# Patient Record
Sex: Male | Born: 1970
Health system: Southern US, Community
[De-identification: ages and names within clinical notes are randomized; demographics above are authoritative.]

## PROBLEM LIST (undated history)

## (undated) DIAGNOSIS — J189 Pneumonia, unspecified organism: Secondary | ICD-10-CM

## (undated) DIAGNOSIS — I739 Peripheral vascular disease, unspecified: Secondary | ICD-10-CM

## (undated) DIAGNOSIS — I219 Acute myocardial infarction, unspecified: Secondary | ICD-10-CM

## (undated) DIAGNOSIS — E119 Type 2 diabetes mellitus without complications: Secondary | ICD-10-CM

## (undated) DIAGNOSIS — I10 Essential (primary) hypertension: Secondary | ICD-10-CM

## (undated) DIAGNOSIS — N189 Chronic kidney disease, unspecified: Secondary | ICD-10-CM

## (undated) DIAGNOSIS — IMO0002 Reserved for concepts with insufficient information to code with codable children: Secondary | ICD-10-CM

## (undated) DIAGNOSIS — B181 Chronic viral hepatitis B without delta-agent: Secondary | ICD-10-CM

## (undated) DIAGNOSIS — N186 End stage renal disease: Secondary | ICD-10-CM

## (undated) HISTORY — PX: CORONARY ARTERY BYPASS GRAFT: SHX141

## (undated) HISTORY — PX: CARDIAC SURGERY: SHX584

## (undated) HISTORY — PX: HIP ARTHROPLASTY: SHX981

## (undated) HISTORY — PX: CORONARY ANGIOPLASTY WITH STENT PLACEMENT: SHX49

---

## 1999-08-12 ENCOUNTER — Emergency Department (HOSPITAL_COMMUNITY): Admission: EM | Admit: 1999-08-12 | Discharge: 1999-08-12 | Payer: Self-pay | Admitting: Emergency Medicine

## 2000-01-28 ENCOUNTER — Emergency Department (HOSPITAL_COMMUNITY): Admission: EM | Admit: 2000-01-28 | Discharge: 2000-01-28 | Payer: Self-pay | Admitting: Emergency Medicine

## 2000-01-29 ENCOUNTER — Encounter: Payer: Self-pay | Admitting: Emergency Medicine

## 2000-02-10 ENCOUNTER — Encounter: Payer: Self-pay | Admitting: Emergency Medicine

## 2000-02-10 ENCOUNTER — Emergency Department (HOSPITAL_COMMUNITY): Admission: EM | Admit: 2000-02-10 | Discharge: 2000-02-10 | Payer: Self-pay | Admitting: Emergency Medicine

## 2000-06-12 ENCOUNTER — Emergency Department (HOSPITAL_COMMUNITY): Admission: EM | Admit: 2000-06-12 | Discharge: 2000-06-12 | Payer: Self-pay | Admitting: Emergency Medicine

## 2000-06-12 ENCOUNTER — Encounter: Payer: Self-pay | Admitting: Emergency Medicine

## 2000-06-15 ENCOUNTER — Emergency Department (HOSPITAL_COMMUNITY): Admission: EM | Admit: 2000-06-15 | Discharge: 2000-06-15 | Payer: Self-pay | Admitting: Emergency Medicine

## 2000-06-15 ENCOUNTER — Encounter: Payer: Self-pay | Admitting: Emergency Medicine

## 2000-10-09 ENCOUNTER — Emergency Department (HOSPITAL_COMMUNITY): Admission: EM | Admit: 2000-10-09 | Discharge: 2000-10-10 | Payer: Self-pay | Admitting: Emergency Medicine

## 2000-11-24 ENCOUNTER — Emergency Department (HOSPITAL_COMMUNITY): Admission: EM | Admit: 2000-11-24 | Discharge: 2000-11-24 | Payer: Self-pay | Admitting: Emergency Medicine

## 2001-07-06 ENCOUNTER — Emergency Department (HOSPITAL_COMMUNITY): Admission: EM | Admit: 2001-07-06 | Discharge: 2001-07-06 | Payer: Self-pay | Admitting: Emergency Medicine

## 2001-08-06 ENCOUNTER — Emergency Department (HOSPITAL_COMMUNITY): Admission: EM | Admit: 2001-08-06 | Discharge: 2001-08-06 | Payer: Self-pay | Admitting: Emergency Medicine

## 2001-12-09 ENCOUNTER — Emergency Department (HOSPITAL_COMMUNITY): Admission: EM | Admit: 2001-12-09 | Discharge: 2001-12-10 | Payer: Self-pay | Admitting: Emergency Medicine

## 2002-01-27 ENCOUNTER — Emergency Department (HOSPITAL_COMMUNITY): Admission: EM | Admit: 2002-01-27 | Discharge: 2002-01-27 | Payer: Self-pay | Admitting: Emergency Medicine

## 2002-01-28 ENCOUNTER — Encounter: Payer: Self-pay | Admitting: Emergency Medicine

## 2002-05-05 ENCOUNTER — Emergency Department (HOSPITAL_COMMUNITY): Admission: EM | Admit: 2002-05-05 | Discharge: 2002-05-05 | Payer: Self-pay

## 2002-09-30 ENCOUNTER — Emergency Department (HOSPITAL_COMMUNITY): Admission: EM | Admit: 2002-09-30 | Discharge: 2002-09-30 | Payer: Self-pay | Admitting: Emergency Medicine

## 2002-09-30 ENCOUNTER — Encounter: Payer: Self-pay | Admitting: Emergency Medicine

## 2003-02-21 ENCOUNTER — Emergency Department (HOSPITAL_COMMUNITY): Admission: EM | Admit: 2003-02-21 | Discharge: 2003-02-21 | Payer: Self-pay | Admitting: Emergency Medicine

## 2003-02-21 ENCOUNTER — Encounter: Payer: Self-pay | Admitting: Emergency Medicine

## 2003-08-25 ENCOUNTER — Ambulatory Visit (HOSPITAL_BASED_OUTPATIENT_CLINIC_OR_DEPARTMENT_OTHER): Admission: RE | Admit: 2003-08-25 | Discharge: 2003-08-25 | Payer: Self-pay | Admitting: Otolaryngology

## 2004-08-07 ENCOUNTER — Emergency Department (HOSPITAL_COMMUNITY): Admission: EM | Admit: 2004-08-07 | Discharge: 2004-08-07 | Payer: Self-pay | Admitting: Unknown Physician Specialty

## 2005-06-14 ENCOUNTER — Emergency Department (HOSPITAL_COMMUNITY): Admission: EM | Admit: 2005-06-14 | Discharge: 2005-06-14 | Payer: Self-pay | Admitting: Emergency Medicine

## 2014-01-09 ENCOUNTER — Emergency Department (HOSPITAL_COMMUNITY): Payer: Self-pay

## 2014-01-09 ENCOUNTER — Inpatient Hospital Stay (HOSPITAL_COMMUNITY): Payer: Self-pay

## 2014-01-09 ENCOUNTER — Encounter (HOSPITAL_COMMUNITY): Payer: Self-pay | Admitting: Emergency Medicine

## 2014-01-09 ENCOUNTER — Inpatient Hospital Stay (HOSPITAL_COMMUNITY)
Admission: EM | Admit: 2014-01-09 | Discharge: 2014-01-12 | DRG: 871 | Disposition: A | Discharge status: Home / Self Care | Payer: Self-pay | Attending: Internal Medicine | Admitting: Internal Medicine

## 2014-01-09 DIAGNOSIS — G4733 Obstructive sleep apnea (adult) (pediatric): Secondary | ICD-10-CM | POA: Diagnosis present

## 2014-01-09 DIAGNOSIS — A419 Sepsis, unspecified organism: Principal | ICD-10-CM

## 2014-01-09 DIAGNOSIS — D72829 Elevated white blood cell count, unspecified: Secondary | ICD-10-CM

## 2014-01-09 DIAGNOSIS — I10 Essential (primary) hypertension: Secondary | ICD-10-CM

## 2014-01-09 DIAGNOSIS — A498 Other bacterial infections of unspecified site: Secondary | ICD-10-CM | POA: Diagnosis present

## 2014-01-09 DIAGNOSIS — R7989 Other specified abnormal findings of blood chemistry: Secondary | ICD-10-CM

## 2014-01-09 DIAGNOSIS — E872 Acidosis, unspecified: Secondary | ICD-10-CM

## 2014-01-09 DIAGNOSIS — I2510 Atherosclerotic heart disease of native coronary artery without angina pectoris: Secondary | ICD-10-CM

## 2014-01-09 DIAGNOSIS — F17200 Nicotine dependence, unspecified, uncomplicated: Secondary | ICD-10-CM | POA: Diagnosis present

## 2014-01-09 DIAGNOSIS — R5081 Fever presenting with conditions classified elsewhere: Secondary | ICD-10-CM

## 2014-01-09 DIAGNOSIS — J40 Bronchitis, not specified as acute or chronic: Secondary | ICD-10-CM | POA: Diagnosis present

## 2014-01-09 DIAGNOSIS — Z794 Long term (current) use of insulin: Secondary | ICD-10-CM

## 2014-01-09 DIAGNOSIS — E876 Hypokalemia: Secondary | ICD-10-CM | POA: Diagnosis not present

## 2014-01-09 DIAGNOSIS — Z6835 Body mass index (BMI) 35.0-35.9, adult: Secondary | ICD-10-CM

## 2014-01-09 DIAGNOSIS — IMO0002 Reserved for concepts with insufficient information to code with codable children: Secondary | ICD-10-CM

## 2014-01-09 DIAGNOSIS — Z7902 Long term (current) use of antithrombotics/antiplatelets: Secondary | ICD-10-CM

## 2014-01-09 DIAGNOSIS — G9341 Metabolic encephalopathy: Secondary | ICD-10-CM | POA: Diagnosis present

## 2014-01-09 DIAGNOSIS — R4182 Altered mental status, unspecified: Secondary | ICD-10-CM | POA: Diagnosis present

## 2014-01-09 DIAGNOSIS — Z7982 Long term (current) use of aspirin: Secondary | ICD-10-CM

## 2014-01-09 DIAGNOSIS — E1010 Type 1 diabetes mellitus with ketoacidosis without coma: Secondary | ICD-10-CM | POA: Diagnosis present

## 2014-01-09 DIAGNOSIS — E1110 Type 2 diabetes mellitus with ketoacidosis without coma: Secondary | ICD-10-CM

## 2014-01-09 DIAGNOSIS — E1165 Type 2 diabetes mellitus with hyperglycemia: Secondary | ICD-10-CM

## 2014-01-09 DIAGNOSIS — Z9861 Coronary angioplasty status: Secondary | ICD-10-CM

## 2014-01-09 DIAGNOSIS — E118 Type 2 diabetes mellitus with unspecified complications: Secondary | ICD-10-CM

## 2014-01-09 DIAGNOSIS — N390 Urinary tract infection, site not specified: Secondary | ICD-10-CM | POA: Diagnosis present

## 2014-01-09 HISTORY — DX: Type 2 diabetes mellitus without complications: E11.9

## 2014-01-09 HISTORY — DX: Essential (primary) hypertension: I10

## 2014-01-09 HISTORY — DX: Reserved for concepts with insufficient information to code with codable children: IMO0002

## 2014-01-09 LAB — CBC WITH DIFFERENTIAL/PLATELET
BASOS ABS: 0 10*3/uL (ref 0.0–0.1)
Basophils Relative: 0 % (ref 0–1)
Eosinophils Absolute: 0.1 10*3/uL (ref 0.0–0.7)
Eosinophils Relative: 1 % (ref 0–5)
HCT: 38.4 % — ABNORMAL LOW (ref 39.0–52.0)
HEMOGLOBIN: 13.6 g/dL (ref 13.0–17.0)
LYMPHS PCT: 8 % — AB (ref 12–46)
Lymphs Abs: 0.9 10*3/uL (ref 0.7–4.0)
MCH: 32.1 pg (ref 26.0–34.0)
MCHC: 35.4 g/dL (ref 30.0–36.0)
MCV: 90.6 fL (ref 78.0–100.0)
Monocytes Absolute: 0.5 10*3/uL (ref 0.1–1.0)
Monocytes Relative: 4 % (ref 3–12)
NEUTROS ABS: 10.9 10*3/uL — AB (ref 1.7–7.7)
NEUTROS PCT: 87 % — AB (ref 43–77)
Platelets: 153 10*3/uL (ref 150–400)
RBC: 4.24 MIL/uL (ref 4.22–5.81)
RDW: 12.8 % (ref 11.5–15.5)
WBC: 12.4 10*3/uL — AB (ref 4.0–10.5)

## 2014-01-09 LAB — CBG MONITORING, ED
Glucose-Capillary: 290 mg/dL — ABNORMAL HIGH (ref 70–99)
Glucose-Capillary: 301 mg/dL — ABNORMAL HIGH (ref 70–99)

## 2014-01-09 LAB — URINALYSIS, ROUTINE W REFLEX MICROSCOPIC
Bilirubin Urine: NEGATIVE
Ketones, ur: NEGATIVE mg/dL
LEUKOCYTES UA: NEGATIVE
Nitrite: NEGATIVE
PH: 5.5 (ref 5.0–8.0)
Protein, ur: 30 mg/dL — AB
Specific Gravity, Urine: 1.025 (ref 1.005–1.030)
Urobilinogen, UA: 1 mg/dL (ref 0.0–1.0)

## 2014-01-09 LAB — COMPREHENSIVE METABOLIC PANEL
ALBUMIN: 3 g/dL — AB (ref 3.5–5.2)
ALK PHOS: 117 U/L (ref 39–117)
ALT: 22 U/L (ref 0–53)
AST: 25 U/L (ref 0–37)
BUN: 9 mg/dL (ref 6–23)
CHLORIDE: 98 meq/L (ref 96–112)
CO2: 19 mEq/L (ref 19–32)
Calcium: 9.2 mg/dL (ref 8.4–10.5)
Creatinine, Ser: 0.68 mg/dL (ref 0.50–1.35)
GFR calc Af Amer: >90 mL/min (ref >90)
GFR calc non Af Amer: >90 mL/min (ref >90)
Glucose, Bld: 337 mg/dL — ABNORMAL HIGH (ref 70–99)
POTASSIUM: 4.1 meq/L (ref 3.7–5.3)
Sodium: 134 mEq/L — ABNORMAL LOW (ref 137–147)
Total Bilirubin: 0.9 mg/dL (ref 0.3–1.2)
Total Protein: 7.3 g/dL (ref 6.0–8.3)

## 2014-01-09 LAB — BASIC METABOLIC PANEL
BUN: 9 mg/dL (ref 6–23)
CALCIUM: 8.4 mg/dL (ref 8.4–10.5)
CO2: 19 mEq/L (ref 19–32)
Chloride: 105 mEq/L (ref 96–112)
Creatinine, Ser: 0.71 mg/dL (ref 0.50–1.35)
GFR calc Af Amer: >90 mL/min (ref >90)
Glucose, Bld: 304 mg/dL — ABNORMAL HIGH (ref 70–99)
POTASSIUM: 3.9 meq/L (ref 3.7–5.3)
SODIUM: 138 meq/L (ref 137–147)

## 2014-01-09 LAB — URINE MICROSCOPIC-ADD ON

## 2014-01-09 LAB — I-STAT CG4 LACTIC ACID, ED: Lactic Acid, Venous: 3.8 mmol/L — ABNORMAL HIGH (ref 0.5–2.2)

## 2014-01-09 LAB — GLUCOSE, CAPILLARY
Glucose-Capillary: 278 mg/dL — ABNORMAL HIGH (ref 70–99)
Glucose-Capillary: 367 mg/dL — ABNORMAL HIGH (ref 70–99)

## 2014-01-09 MED ORDER — VANCOMYCIN HCL IN DEXTROSE 1-5 GM/200ML-% IV SOLN
1000.0000 mg | Freq: Once | INTRAVENOUS | Status: DC
  Filled 2014-01-09: qty 200

## 2014-01-09 MED ORDER — INSULIN ASPART 100 UNIT/ML ~~LOC~~ SOLN
0.0000 [IU] | Freq: Every day | SUBCUTANEOUS | Status: DC
  Administered 2014-01-09: 5 [IU] via SUBCUTANEOUS
  Administered 2014-01-10 – 2014-01-11 (×2): 2 [IU] via SUBCUTANEOUS

## 2014-01-09 MED ORDER — ENOXAPARIN SODIUM 40 MG/0.4ML ~~LOC~~ SOLN
40.0000 mg | SUBCUTANEOUS | Status: DC
  Administered 2014-01-09 – 2014-01-11 (×3): 40 mg via SUBCUTANEOUS
  Filled 2014-01-09 (×4): qty 0.4

## 2014-01-09 MED ORDER — PIPERACILLIN-TAZOBACTAM 3.375 G IVPB 30 MIN
3.3750 g | Freq: Once | INTRAVENOUS | Status: AC
  Administered 2014-01-09: 3.375 g via INTRAVENOUS
  Filled 2014-01-09: qty 50

## 2014-01-09 MED ORDER — ASPIRIN EC 81 MG PO TBEC
81.0000 mg | DELAYED_RELEASE_TABLET | Freq: Every day | ORAL | Status: DC
  Administered 2014-01-10 – 2014-01-12 (×3): 81 mg via ORAL
  Filled 2014-01-09 (×3): qty 1

## 2014-01-09 MED ORDER — VANCOMYCIN HCL 10 G IV SOLR
2500.0000 mg | INTRAVENOUS | Status: AC
  Administered 2014-01-09: 2500 mg via INTRAVENOUS
  Filled 2014-01-09: qty 2500

## 2014-01-09 MED ORDER — DEXTROSE 5 % IV SOLN
1.0000 g | Freq: Once | INTRAVENOUS | Status: AC
  Administered 2014-01-09: 1 g via INTRAVENOUS
  Filled 2014-01-09: qty 10

## 2014-01-09 MED ORDER — SODIUM CHLORIDE 0.9 % IV SOLN
INTRAVENOUS | Status: DC
  Filled 2014-01-09: qty 1

## 2014-01-09 MED ORDER — PIPERACILLIN-TAZOBACTAM 3.375 G IVPB
3.3750 g | Freq: Three times a day (TID) | INTRAVENOUS | Status: DC
  Administered 2014-01-09 – 2014-01-12 (×8): 3.375 g via INTRAVENOUS
  Filled 2014-01-09 (×10): qty 50

## 2014-01-09 MED ORDER — SODIUM CHLORIDE 0.9 % IV SOLN
1000.0000 mL | Freq: Once | INTRAVENOUS | Status: AC
  Administered 2014-01-09: 1000 mL via INTRAVENOUS

## 2014-01-09 MED ORDER — INSULIN ASPART 100 UNIT/ML ~~LOC~~ SOLN
0.0000 [IU] | Freq: Three times a day (TID) | SUBCUTANEOUS | Status: DC
  Administered 2014-01-10: 3 [IU] via SUBCUTANEOUS
  Administered 2014-01-10: 5 [IU] via SUBCUTANEOUS
  Administered 2014-01-10: 8 [IU] via SUBCUTANEOUS
  Administered 2014-01-11: 5 [IU] via SUBCUTANEOUS
  Administered 2014-01-11 (×2): 8 [IU] via SUBCUTANEOUS
  Administered 2014-01-12: 5 [IU] via SUBCUTANEOUS

## 2014-01-09 MED ORDER — LISINOPRIL 20 MG PO TABS
20.0000 mg | ORAL_TABLET | Freq: Two times a day (BID) | ORAL | Status: DC
  Administered 2014-01-10 – 2014-01-12 (×5): 20 mg via ORAL
  Filled 2014-01-09 (×7): qty 1

## 2014-01-09 MED ORDER — ACETAMINOPHEN 325 MG PO TABS
650.0000 mg | ORAL_TABLET | Freq: Four times a day (QID) | ORAL | Status: DC | PRN
  Administered 2014-01-10 – 2014-01-11 (×3): 650 mg via ORAL
  Filled 2014-01-09 (×3): qty 2

## 2014-01-09 MED ORDER — ONDANSETRON HCL 4 MG/2ML IJ SOLN: 4.0000 mg | Freq: Four times a day (QID) | INTRAMUSCULAR | Status: DC | PRN

## 2014-01-09 MED ORDER — OXYCODONE HCL 5 MG PO TABS: 5.0000 mg | ORAL_TABLET | ORAL | Status: DC | PRN

## 2014-01-09 MED ORDER — ACETAMINOPHEN 650 MG RE SUPP
650.0000 mg | Freq: Four times a day (QID) | RECTAL | Status: DC | PRN
Start: 2014-01-09 — End: 2014-01-12

## 2014-01-09 MED ORDER — SODIUM CHLORIDE 0.9 % IV SOLN
1000.0000 mL | INTRAVENOUS | Status: DC
  Administered 2014-01-09 – 2014-01-11 (×2): 1000 mL via INTRAVENOUS

## 2014-01-09 MED ORDER — DEXTROSE-NACL 5-0.45 % IV SOLN: INTRAVENOUS | Status: DC

## 2014-01-09 MED ORDER — ONDANSETRON HCL 4 MG PO TABS: 4.0000 mg | ORAL_TABLET | Freq: Four times a day (QID) | ORAL | Status: DC | PRN

## 2014-01-09 MED ORDER — INSULIN ASPART PROT & ASPART (70-30 MIX) 100 UNIT/ML ~~LOC~~ SUSP
15.0000 [IU] | Freq: Two times a day (BID) | SUBCUTANEOUS | Status: DC
  Administered 2014-01-09 – 2014-01-12 (×6): 15 [IU] via SUBCUTANEOUS
  Filled 2014-01-09 (×2): qty 10

## 2014-01-09 MED ORDER — VANCOMYCIN HCL 10 G IV SOLR
1250.0000 mg | Freq: Two times a day (BID) | INTRAVENOUS | Status: DC
  Administered 2014-01-10 – 2014-01-11 (×3): 1250 mg via INTRAVENOUS
  Filled 2014-01-09 (×4): qty 1250

## 2014-01-09 MED ORDER — ACETAMINOPHEN 650 MG RE SUPP
650.0000 mg | Freq: Once | RECTAL | Status: AC
  Administered 2014-01-09: 650 mg via RECTAL
  Filled 2014-01-09: qty 1

## 2014-01-09 MED ORDER — SODIUM CHLORIDE 0.9 % IV BOLUS (SEPSIS)
1000.0000 mL | Freq: Once | INTRAVENOUS | Status: AC
  Administered 2014-01-09: 1000 mL via INTRAVENOUS

## 2014-01-09 NOTE — ED Notes (Signed)
Patient CBG upon arrival 79

## 2014-01-09 NOTE — Progress Notes (Addendum)
ANTIBIOTIC CONSULT NOTE - INITIAL  Pharmacy Consult for Vanc / Zosyn Indication: Sepsis  No Known Allergies  Patient Measurements:   Adjusted Body Weight:   Vital Signs: Temp: 103.1 F (39.5 C) (05/02 1318) BP: 181/115 mmHg (05/02 1318) Pulse Rate: 120 (05/02 1318) Intake/Output from previous day:   Intake/Output from this shift:    Labs: No results found for this basename: WBC, HGB, PLT, LABCREA, CREATININE,  in the last 72 hours CrCl is unknown because no creatinine reading has been taken and the patient has no height on file. No results found for this basename: VANCOTROUGH, VANCOPEAK, VANCORANDOM, GENTTROUGH, GENTPEAK, GENTRANDOM, TOBRATROUGH, TOBRAPEAK, TOBRARND, AMIKACINPEAK, AMIKACINTROU, AMIKACIN,  in the last 72 hours   Microbiology: No results found for this or any previous visit (from the past 720 hour(s)).  Medical History: Past Medical History  Diagnosis Date  . Diabetes mellitus without complication   . Hypertension    Assessment: 79 yoM presents to [facility] with fever of 103.1, chills, and hyperglycemia.  Vancomycin and Zosyn ordered x 1 in ED, and pharmacy consulted to dose for sepsis.   No recent height/weight/lab values.    NKDA  5/2 >> CTX x1 5/2 >> Vancomycin >>   5/2 >> Zosyn >>  Tmax: 103.1 WBCs: CBC ordered Renal: CMET ordered  5/2 blood: ordered 5/2 urine: collected   Goal of Therapy:  Vancomycin trough level 15-20 mcg/ml Eradication of infection  Plan:  Wait to order maintenance doses of vancomycin and zosyn until renal function and weight known  Ralene Bathe, PharmD, BCPS 01/09/2014, 1:39 PM  Pager: [phone]  ADDENDUM:   Labs/weight/height now entered   CrCl >100 ml/min (CG and N) Wt 127kg  Plan: Start Zosyn 3.375g IV q8h (infuse over 4 hours) Change initial vancomycin dose to 2500mg  IV x1, then start 1250mg  IV q12h  F/u vanc trough at Css, renal fxn, clinical course  Ralene Bathe, PharmD, BCPS 01/09/2014, 3:00 PM  Pager:  [phone]

## 2014-01-09 NOTE — ED Notes (Signed)
Pt called EMS post admin insulin reporting that he had chills. Pt shaking upon arrival. Pt wife reports that pt was "fine this morning." Pt is alert and answers questions appropriately, and looking at wife's cell phone at this time. Pt in NAD

## 2014-01-09 NOTE — ED Notes (Signed)
Pt sitting up in bed, laughing with family. Pt is A&O and denies pain. Pt states " I feel better, can I go home now?"

## 2014-01-09 NOTE — ED Provider Notes (Signed)
CSN: [id number]     Arrival date & time 01/09/14  1315 History   First MD Initiated Contact with Patient 01/09/14 1321     Chief Complaint  Patient presents with  . Fever  . Hyperglycemia     (Consider location/radiation/quality/duration/timing/severity/associated sxs/prior Treatment) HPI  This 43 year old male with history of diabetes and hypertension, coronary artery disease who presents with fever, altered mental status and hyperglycemia.  Patient states he has not been feeling well. He reports several days of chills and headache. Upon my initial evaluation, patient is awake and alert and appears to be answering questions appropriately. However, at times he is disoriented. He denies any pain anywhere. He does endorse a nonproductive cough. States his blood sugars have been "all over the place."  Level V caveat for acuity of condition  Past Medical History  Diagnosis Date  . Diabetes mellitus without complication   . Hypertension   . Macular infarction     2012   Past Surgical History  Procedure Laterality Date  . Coronary angioplasty with stent placement    . Hip arthroplasty Right    No family history on file. History  Substance Use Topics  . Smoking status: Current Every Day Smoker -- 2.00 packs/day    Types: Cigarettes  . Smokeless tobacco: Not on file  . Alcohol Use: No    Review of Systems  Constitutional: Positive for fever, chills and diaphoresis.  Respiratory: Negative.  Negative for chest tightness and shortness of breath.   Cardiovascular: Negative.  Negative for chest pain.  Gastrointestinal: Negative.  Negative for nausea, vomiting and abdominal pain.  Endocrine: Positive for polydipsia.  Genitourinary: Negative.  Negative for dysuria.  Musculoskeletal: Negative for back pain.  Skin: Positive for rash.  Neurological: Positive for headaches. Negative for dizziness, weakness and numbness.  All other systems reviewed and are negative.     Allergies   Review of patient's allergies indicates no known allergies.  Home Medications   Prior to Admission medications   Medication Sig Start Date End Date Taking? Authorizing Provider  aspirin EC 81 MG tablet Take 81 mg by mouth daily.   Yes Historical Provider, MD  insulin NPH-regular Human (NOVOLIN 70/30) (70-30) 100 UNIT/ML injection Inject 10 Units into the skin 2 (two) times daily with a meal.   Yes Historical Provider, MD  lisinopril (PRINIVIL,ZESTRIL) 20 MG tablet Take 20 mg by mouth 2 (two) times daily.   Yes Historical Provider, MD  metFORMIN (GLUCOPHAGE) 1000 MG tablet Take 1,000 mg by mouth 2 (two) times daily with a meal.   Yes Historical Provider, MD  PRESCRIPTION MEDICATION Take 1 tablet by mouth daily. Cholesterol medication   Yes Historical Provider, MD   BP 138/68  Pulse 122  Temp(Src) 101.8 F (38.8 C) (Axillary)  Resp 23  Ht 6\' 3"  (1.905 m)  Wt 280 lb (127.007 kg)  BMI 35.00 kg/m2  SpO2 94% Physical Exam  Nursing note and vitals reviewed. Constitutional: He is oriented to person, place, and time.  Morbidly obese, ill-appearing  HENT:  Head: Normocephalic and atraumatic.  Mouth/Throat: Oropharynx is clear and moist.  Eyes: Pupils are equal, round, and reactive to light.  Neck: Neck supple. No JVD present.  Cardiovascular: Regular rhythm and normal heart sounds.   No murmur heard. Tachycardia  Pulmonary/Chest: Effort normal and breath sounds normal. No respiratory distress. He has no wheezes.  Abdominal: Soft. Bowel sounds are normal. There is no tenderness. There is no rebound.  Protuberant  Genitourinary:  Uncircumcised,  normal cremasteric reflexes, no discharge noted, no crepitus  Musculoskeletal: He exhibits edema.  Trace edema  Lymphadenopathy:    He has no cervical adenopathy.  Neurological: He is alert and oriented to person, place, and time.  Skin: Skin is warm and dry.  Boil left groin  Psychiatric: He has a normal mood and affect.    ED Course   Procedures (including critical care time)  CRITICAL CARE Performed by: Merryl Hacker   Total critical care time: 45 min  Critical care time was exclusive of separately billable procedures and treating other patients.  Critical care was necessary to treat or prevent imminent or life-threatening deterioration.  Critical care was time spent personally by me on the following activities: development of treatment plan with patient and/or surrogate as well as nursing, discussions with consultants, evaluation of patient's response to treatment, examination of patient, obtaining history from patient or surrogate, ordering and performing treatments and interventions, ordering and review of laboratory studies, ordering and review of radiographic studies, pulse oximetry and re-evaluation of patient's condition.  Labs Review Labs Reviewed  CBC WITH DIFFERENTIAL - Abnormal; Notable for the following:    WBC 12.4 (*)    HCT 38.4 (*)    Neutrophils Relative % 87 (*)    Neutro Abs 10.9 (*)    Lymphocytes Relative 8 (*)    All other components within normal limits  COMPREHENSIVE METABOLIC PANEL - Abnormal; Notable for the following:    Sodium 134 (*)    Glucose, Bld 337 (*)    Albumin 3.0 (*)    All other components within normal limits  URINALYSIS, ROUTINE W REFLEX MICROSCOPIC - Abnormal; Notable for the following:    Glucose, UA >1000 (*)    Hgb urine dipstick SMALL (*)    Protein, ur 30 (*)    All other components within normal limits  URINE MICROSCOPIC-ADD ON - Abnormal; Notable for the following:    Bacteria, UA MANY (*)    All other components within normal limits  I-STAT CG4 LACTIC ACID, ED - Abnormal; Notable for the following:    Lactic Acid, Venous 3.80 (*)    All other components within normal limits  CBG MONITORING, ED - Abnormal; Notable for the following:    Glucose-Capillary 290 (*)    All other components within normal limits  CULTURE, BLOOD (ROUTINE X 2)  CULTURE,  BLOOD (ROUTINE X 2)  URINE CULTURE  CSF CULTURE  GRAM STAIN  CSF CELL COUNT WITH DIFFERENTIAL  GLUCOSE, CSF  PROTEIN, CSF    Imaging Review Dg Chest Port 1 View  01/09/2014   CLINICAL DATA:  Shortness of breath, confusion, history of hyperglycemia, diabetes, hypertension  EXAM: PORTABLE CHEST - 1 VIEW  COMPARISON:  None.  FINDINGS: The examination is degraded due to patient body habitus.  Enlarged cardiac silhouette and mediastinal contours given decreased lung volumes and patient rotation. There is mild cephalization of flow without frank evidence of edema. And bilateral medial basilar heterogeneous airspace opacities favored to represent atelectasis. No pleural effusion or pneumothorax. No acute osseus abnormalities.  IMPRESSION: Degraded examination with findings suggestive of pulmonary venous congestion and perihilar/bibasilar atelectasis. Further evaluation with a PA and lateral chest radiograph may be obtained as clinically indicated.   Electronically Signed   By: Sandi Mariscal M.D.   On: 01/09/2014 14:02     EKG Interpretation None      MDM   Final diagnoses:  Sepsis  DKA (diabetic ketoacidoses)  POssible Meningitis  Patient  presents febrile and hyperglycemia. Initially delirious likely secondary to fever. Sepsis workup initiated with blood cultures, basic labwork, lactate, chest x-ray and urinalysis. Lactate 3.5. Mild leukocytosis. Patient given Ativan, Zosyn, and Rocephin to cover for meningitis given recent headaches. No evidence of meningismus on exam. Workup otherwise notable for slightly dirty UA with white cells which may be a source. However, given recent headache, felt prudent to get an LP. Radiology requested to perform LP. She stated to radiology that he is currently on Plavix which he did not tell the pharmacist or myself. LP cannot be performed at this time. Discussed with admitting hospitalist. Will be admitted to telemetry.      Merryl Hacker, MD 01/09/14  [phone]

## 2014-01-09 NOTE — ED Notes (Signed)
Pt transported to IR for LP.

## 2014-01-09 NOTE — ED Notes (Signed)
Pt in via EMS c/o fever, hyperglycemia. 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 Pt CBG was 431 on scene, had tremors, but A&O. Pt in NAD

## 2014-01-09 NOTE — H&P (Addendum)
[facility] History and Physical  Aldine Gornik [id number] DOB: Oct 13, 1970 DOA: 01/09/2014  Referring physician: EDP PCP: No primary provider on file.   Chief Complaint: fever and delirium today.   HPI: Anakin Nolette is a 43 y.o. male with prior h/o hypertension, CAD s/p PCI, DM, OSA,  Presented with fever and chills today after lunch , associated with altered mental status. He was given tylenol and his fever resolved. He became more alert and oriented to place, person and time. He reported he had two episodes of headache resolved after sleeping int he last one week. He denies any chest pain, sob, palpitations, orthopnea pedal edema. He reports non productive cough started today. On arrival to ED, his cbg'S were elevated and he was found to have elevated anion gap. He was started on IV glucostabilizer by ED, and was put on IV antibiotics empirically. He was also put on schedule to undergo LP to evaluate for meningitis for his fever and altered mental status, but could not be done as he reported that he was on plavix. He is referred to medical service for admission and further recommendations.     Review of Systems:  Constitutional:  No weight loss, night sweats , Positive for  Fevers, chills, fatigue.  HEENT:  Positive for nasal congestion, post nasal drip,  Cardio-vascular:  No chest pain, Orthopnea, PND, swelling in lower extremities, anasarca, dizziness, palpitations  GI:  No heartburn, indigestion, abdominal pain, nausea, vomiting, diarrhea, change in bowel habits, loss of appetite  Resp:  Positive for shortness of breath.  Skin:  no rash or lesions.  GU:  no dysuria, change in color of urine, no urgency or frequency. No flank pain.  Musculoskeletal:  No joint pain or swelling. No decreased range of motion. No back pain.    Past Medical History  Diagnosis Date  . Diabetes mellitus without complication   . Hypertension   . Macular infarction     2012   Past Surgical  History  Procedure Laterality Date  . Coronary angioplasty with stent placement    . Hip arthroplasty Right    Social History:  reports that he has been smoking Cigarettes.  He has been smoking about 2.00 packs per day. He does not have any smokeless tobacco history on file. He reports that he does not drink alcohol or use illicit drugs.  No Known Allergies  No family history on file.   Prior to Admission medications   Medication Sig Start Date End Date Taking? Authorizing Provider  aspirin EC 81 MG tablet Take 81 mg by mouth daily.   Yes Historical Provider, MD  insulin NPH-regular Human (NOVOLIN 70/30) (70-30) 100 UNIT/ML injection Inject 10 Units into the skin 2 (two) times daily with a meal.   Yes Historical Provider, MD  lisinopril (PRINIVIL,ZESTRIL) 20 MG tablet Take 20 mg by mouth 2 (two) times daily.   Yes Historical Provider, MD  metFORMIN (GLUCOPHAGE) 1000 MG tablet Take 1,000 mg by mouth 2 (two) times daily with a meal.   Yes Historical Provider, MD  PRESCRIPTION MEDICATION Take 1 tablet by mouth daily. Cholesterol medication   Yes Historical Provider, MD   Physical Exam: Filed Vitals:   01/09/14 1608  BP:   Pulse:   Temp: 99.7 F (37.6 C)  Resp:     BP 142/83  Pulse 125  Temp(Src) 99.7 F (37.6 C) (Oral)  Resp 24  Ht 6\' 3"  (1.905 m)  Wt 127.007 kg (280 lb)  BMI 35.00 kg/m2  SpO2 96%  General:  Appears calm and comfortable Eyes: PERRL, normal lids, irises & conjunctiva ENT: grossly normal hearing, lips & tongue Neck: no LAD, masses or thyromegaly Cardiovascular: tachycardic. no m/r/g. No LE edema. Respiratory: CTA bilaterally, no w/r/r. Normal respiratory effort. Abdomen: soft, ntnd Skin: no rash or induration seen on limited exam Musculoskeletal: grossly normal tone BUE/BLE Neurologic: grossly non-focal. No meningismal signs,  Able to move all extremities, no facial droop, speech normal.           Labs on Admission:  Basic Metabolic Panel:  Recent  Labs Lab 01/09/14 1330  NA 134*  K 4.1  CL 98  CO2 19  GLUCOSE 337*  BUN 9  CREATININE 0.68  CALCIUM 9.2   Liver Function Tests:  Recent Labs Lab 01/09/14 1330  AST 25  ALT 22  ALKPHOS 117  BILITOT 0.9  PROT 7.3  ALBUMIN 3.0*   No results found for this basename: LIPASE, AMYLASE,  in the last 168 hours No results found for this basename: AMMONIA,  in the last 168 hours CBC:  Recent Labs Lab 01/09/14 1330  WBC 12.4*  NEUTROABS 10.9*  HGB 13.6  HCT 38.4*  MCV 90.6  PLT 153   Cardiac Enzymes: No results found for this basename: CKTOTAL, CKMB, CKMBINDEX, TROPONINI,  in the last 168 hours  BNP (last 3 results) No results found for this basename: PROBNP,  in the last 8760 hours CBG:  Recent Labs Lab 01/09/14 1531 01/09/14 1716  GLUCAP 290* 301*    Radiological Exams on Admission: Dg Chest 2 View  01/09/2014   CLINICAL DATA:  Cough and fever.  EXAM: CHEST  2 VIEW  COMPARISON:  01/09/2014.  FINDINGS: The cardiomediastinal silhouette is unremarkable.  Peribronchial thickening is noted.  There is no evidence of focal airspace disease, pulmonary edema, suspicious pulmonary nodule/mass, pleural effusion, or pneumothorax. No acute bony abnormalities are identified.  IMPRESSION: Peribronchial thickening without focal pneumonia.   Electronically Signed   By: Hassan Rowan M.D.   On: 01/09/2014 17:25   Ct Head Wo Contrast  01/09/2014   CLINICAL DATA:  Fever, hyperglycemia  EXAM: CT HEAD WITHOUT CONTRAST  TECHNIQUE: Contiguous axial images were obtained from the base of the skull through the vertex without intravenous contrast.  COMPARISON:  None.  FINDINGS: Scattered periventricular hypodensities compatible with microvascular ischemic disease. There is an old lacunar infarct within the gray-white junction of the left frontal lobe (image 20, series 2). The great differentiation is otherwise well maintained without definitive CT evidence of acute large territory infarct. No  intraparenchymal or extra-axial mass or hemorrhage. Normal size and configuration of the ventricles and basal cisterns. No midline shift. There is scattered opacification of the left anterior and posterior ethmoidal air cells. There is minimal polypoid mucosal thickening within the left maxillary sinus. The remaining paranasal sinuses and mastoid air cells are normally aerated. No air-fluid levels. Regional soft tissues appear normal.  IMPRESSION: Microvascular ischemic disease without definite acute intracranial process.   Electronically Signed   By: Sandi Mariscal M.D.   On: 01/09/2014 16:05   Dg Chest Port 1 View  01/09/2014   CLINICAL DATA:  Shortness of breath, confusion, history of hyperglycemia, diabetes, hypertension  EXAM: PORTABLE CHEST - 1 VIEW  COMPARISON:  None.  FINDINGS: The examination is degraded due to patient body habitus.  Enlarged cardiac silhouette and mediastinal contours given decreased lung volumes and patient rotation. There is mild cephalization of flow without frank evidence of edema. And  bilateral medial basilar heterogeneous airspace opacities favored to represent atelectasis. No pleural effusion or pneumothorax. No acute osseus abnormalities.  IMPRESSION: Degraded examination with findings suggestive of pulmonary venous congestion and perihilar/bibasilar atelectasis. Further evaluation with a PA and lateral chest radiograph may be obtained as clinically indicated.   Electronically Signed   By: Sandi Mariscal M.D.   On: 01/09/2014 14:02    EKG: sinus tachycardia.  Assessment/Plan Active Problems:   DKA (diabetic ketoacidoses)   Type II or unspecified type diabetes mellitus with unspecified complication, uncontrolled   Essential hypertension, benign   CAD (coronary artery disease)   Fever presenting with conditions classified elsewhere   Leukocytosis, unspecified   MIld DKA: - admit to telemetry.  - start on IV glucostabilizer and repeat bmp int he evening. If AG closes, we  can transition to sq novolin and SSI.  - his UA is not sig for infection and he denies any symptoms.  - his CXR reveals bronchitis and no pneumonia.  - we will get influenza pcr. And his skin is intact.  - get hgba1c   Acute encephalopathy : - probably delirious from fever . He became normal to baseline after the fever was controlled.  - continue to monitor.    Hypertension: Controlled.   CAD s/p PCI: - H e denies any chest pain or sob or palpitations.  - will resume aspirin.    Fever: - unclear source.  - LP pending as per IR.  - blood cultures and urine cultures done and pending.  - on empiric antibiotics with vanco and zosyn. Can d/c antibiotics if blood cultures are negative for 48 hours and if he deosnt' have any fever.    Leukocytosis: - probably reactive.  - will repeat in am.   Lactic acidosis: - on metformin at home, will d/c it .  - check repeat level in am.   OSA: - on CPAP.    DVT prophylaxis.     Code Status: full code Family Communication: discussed with family atbedside Disposition Plan: admit to telemetry  Time spent: 70 min  [facility] Pager [phone]

## 2014-01-09 NOTE — ED Notes (Signed)
IR unable to perform LP d/t pt took plavix this am

## 2014-01-10 ENCOUNTER — Inpatient Hospital Stay (HOSPITAL_COMMUNITY): Payer: Self-pay

## 2014-01-10 DIAGNOSIS — A419 Sepsis, unspecified organism: Principal | ICD-10-CM

## 2014-01-10 DIAGNOSIS — I2510 Atherosclerotic heart disease of native coronary artery without angina pectoris: Secondary | ICD-10-CM

## 2014-01-10 LAB — BASIC METABOLIC PANEL
BUN: 9 mg/dL (ref 6–23)
CHLORIDE: 103 meq/L (ref 96–112)
CO2: 20 mEq/L (ref 19–32)
Calcium: 9 mg/dL (ref 8.4–10.5)
Creatinine, Ser: 0.65 mg/dL (ref 0.50–1.35)
GFR calc non Af Amer: >90 mL/min (ref >90)
Glucose, Bld: 297 mg/dL — ABNORMAL HIGH (ref 70–99)
POTASSIUM: 4.1 meq/L (ref 3.7–5.3)
Sodium: 137 mEq/L (ref 137–147)

## 2014-01-10 LAB — RAPID URINE DRUG SCREEN, HOSP PERFORMED
Amphetamines: NOT DETECTED
Barbiturates: NOT DETECTED
Benzodiazepines: NOT DETECTED
Cocaine: NOT DETECTED
OPIATES: NOT DETECTED
TETRAHYDROCANNABINOL: NOT DETECTED

## 2014-01-10 LAB — CBC
HCT: 36.5 % — ABNORMAL LOW (ref 39.0–52.0)
HEMOGLOBIN: 12.6 g/dL — AB (ref 13.0–17.0)
MCH: 31.5 pg (ref 26.0–34.0)
MCHC: 34.5 g/dL (ref 30.0–36.0)
MCV: 91.3 fL (ref 78.0–100.0)
Platelets: 146 10*3/uL — ABNORMAL LOW (ref 150–400)
RBC: 4 MIL/uL — ABNORMAL LOW (ref 4.22–5.81)
RDW: 12.9 % (ref 11.5–15.5)
WBC: 9.2 10*3/uL (ref 4.0–10.5)

## 2014-01-10 LAB — INFLUENZA PANEL BY PCR (TYPE A & B)
H1N1 flu by pcr: NOT DETECTED
INFLAPCR: NEGATIVE
INFLBPCR: NEGATIVE

## 2014-01-10 LAB — GLUCOSE, CAPILLARY
GLUCOSE-CAPILLARY: 235 mg/dL — AB (ref 70–99)
Glucose-Capillary: 276 mg/dL — ABNORMAL HIGH (ref 70–99)
Glucose-Capillary: 181 mg/dL — ABNORMAL HIGH (ref 70–99)
Glucose-Capillary: 214 mg/dL — ABNORMAL HIGH (ref 70–99)

## 2014-01-10 LAB — TSH: TSH: 0.483 u[IU]/mL (ref 0.350–4.500)

## 2014-01-10 NOTE — Progress Notes (Signed)
Patient ID: Howard Mcmahon, male   DOB: 11-22-1970, 43 y.o.   MRN: [id number]  [facility] PROGRESS NOTE  Howard Mcmahon [id number] DOB: 02-18-1971 DOA: 01/09/2014 PCP: No primary provider on file.  Brief narrative: 43 y.o. male with prior h/o hypertension, CAD s/p PCI, DM, OSA, Presented with fever and chills after lunch prior to this admission, associated with altered mental status. He was given tylenol and his fever resolved. He became more alert and oriented to place, person and time. He reported he had two episodes of headache resolved after sleeping in  the last one week. He denies any chest pain, dyspnea, palpitations, orthopnea, pedal edema. He reports non productive cough that started one day PTA.   On arrival to ED, his CBG'S were elevated and he was found to have elevated anion gap. He was started on IV glucostabilizer by ED, and was put on IV antibiotics empirically. He was also put on schedule to undergo LP to evaluate for meningitis for his fever and altered mental status, but could not be done as he reported that he was on plavix. He is referred to medical service for admission and further recommendations.   Active Problems:   Sepsis - satisfy criteria given leukocytosis, fever 103 F, tachycardia, but etiology is unclear - continue Vancomycin and Zosyn day #2 - follow up on urine, blood culture - awaiting for LP but apparently pt needs to be off Plavix for 5 days - follow up on MRI brain    Acute encephalopathy - likely related to principal problem but exact etiology again is unclear - treat with empiric ABX as noted above and follow upon MRI    DKA (diabetic ketoacidoses) - AG closed, discontinue insulin drip and place on long acing insulin - follow up on A1C    Type II or unspecified type diabetes mellitus with unspecified complication, uncontrolled - follow upon A1C - continue long acting insulin BID and add SSI while inpatient    Essential hypertension, benign -  reasonable inpatient control - continue Lisinopril    CAD (coronary artery disease) - appears to be clinically compensated - continue aspirin   Consultants:  None  Procedures/Studies: CXR  01/09/2014   Peribronchial thickening without focal pneumonia.  Ct Head Wo Contrast  01/09/2014   Microvascular ischemic disease without definite acute intracranial process.    Mr Brain Wo Contrast  01/10/2014   Premature small vessel disease.  No acute intracranial findings.     CXR  Pulmonary venous congestion and perihilar/bibasilar atelectasis.   Antibiotics:  Vancomycin 5/2 -->  Zosyn 5/2 -->   Code Status: Full Family Communication: Pt and wife at bedside Disposition Plan: Home when medically stable  HPI/Subjective: No events overnight.   Objective: Filed Vitals:   01/09/14 2100 01/10/14 0639 01/10/14 0921 01/10/14 1656  BP: 124/75 145/85 157/90 160/73  Pulse: 95 98  123  Temp: 98.5 F (36.9 C) 99.3 F (37.4 C)  102.9 F (39.4 C)  TempSrc: Oral Oral  Oral  Resp: 20 20  22   Height:      Weight:      SpO2: 98% 92%  100%    Intake/Output Summary (Last 24 hours) at 01/10/14 1709 Last data filed at 01/10/14 1554  Gross per 24 hour  Intake   3300 ml  Output   4825 ml  Net  -1525 ml    Exam:   General:  Pt is alert, follows commands appropriately, not in acute distress  Cardiovascular: Regular rhythm, tachycardic, S1/S2,  no murmurs, no rubs, no gallops  Respiratory: Clear to auscultation bilaterally, no wheezing, bibasilar crackles   Abdomen: Soft, non tender, non distended, bowel sounds present, no guarding  Extremities: No edema, pulses DP and PT palpable bilaterally  Neuro: Grossly nonfocal  Data Reviewed: Basic Metabolic Panel:  Recent Labs Lab 01/09/14 1330 01/09/14 1828 01/10/14 0517  NA 134* 138 137  K 4.1 3.9 4.1  CL 98 105 103  CO2 19 19 20   GLUCOSE 337* 304* 297*  BUN 9 9 9   CREATININE 0.68 0.71 0.65  CALCIUM 9.2 8.4 9.0   Liver Function  Tests:  Recent Labs Lab 01/09/14 1330  AST 25  ALT 22  ALKPHOS 117  BILITOT 0.9  PROT 7.3  ALBUMIN 3.0*   CBC:  Recent Labs Lab 01/09/14 1330 01/10/14 0517  WBC 12.4* 9.2  NEUTROABS 10.9*  --   HGB 13.6 12.6*  HCT 38.4* 36.5*  MCV 90.6 91.3  PLT 153 146*   CBG:  Recent Labs Lab 01/09/14 1752 01/09/14 2058 01/10/14 0730 01/10/14 1158 01/10/14 1654  GLUCAP 278* 367* 276* 235* 181*   Scheduled Meds: . aspirin EC  81 mg Oral Daily  . enoxaparin (LOVENOX) injection  40 mg Subcutaneous Q24H  . insulin aspart  0-15 Units Subcutaneous TID WC  . insulin aspart  0-5 Units Subcutaneous QHS  . insulin aspart protamine- aspart  15 Units Subcutaneous BID WC  . lisinopril  20 mg Oral BID  . piperacillin-tazobactam (ZOSYN)  IV  3.375 g Intravenous 3 times per day  . vancomycin  1,250 mg Intravenous Q12H   Continuous Infusions: . sodium chloride 1,000 mL (01/09/14 1516)     Theodis Blaze, MD  [facility] Pager [phone]  If 7PM-7AM, please contact night-coverage [email or web address] Password [email or web address] 01/10/2014, 5:09 PM   LOS: 1 day

## 2014-01-10 NOTE — Progress Notes (Signed)
Patient temperature  102.9 PRN tylenol given, temp down to 98.2. Dr. Doyle Askew notified and blood culture ordered. Patient in room resting will continue to monitor patient.

## 2014-01-10 NOTE — Progress Notes (Signed)
PT Cancellation Note  Patient Details Name: Howard Mcmahon MRN: [id number] DOB: Jan 14, 1971   Cancelled Treatment:    Reason Eval/Treat Not Completed: PT screened, no needs identified, will sign off (Pt noted up ad lib, RN confirms no PT needs at this time. Please reorder if status changes.)   Shella Maxim [facility] 01/10/2014, 9:48 AM Tresa Endo PT [phone]

## 2014-01-11 LAB — GLUCOSE, CAPILLARY
GLUCOSE-CAPILLARY: 343 mg/dL — AB (ref 70–99)
GLUCOSE-CAPILLARY: 271 mg/dL — AB (ref 70–99)
Glucose-Capillary: 254 mg/dL — ABNORMAL HIGH (ref 70–99)
Glucose-Capillary: 216 mg/dL — ABNORMAL HIGH (ref 70–99)
Glucose-Capillary: 237 mg/dL — ABNORMAL HIGH (ref 70–99)

## 2014-01-11 LAB — CBC
HCT: 36.3 % — ABNORMAL LOW (ref 39.0–52.0)
HEMOGLOBIN: 12.6 g/dL — AB (ref 13.0–17.0)
MCH: 31.4 pg (ref 26.0–34.0)
MCHC: 34.7 g/dL (ref 30.0–36.0)
MCV: 90.5 fL (ref 78.0–100.0)
PLATELETS: 143 10*3/uL — AB (ref 150–400)
RBC: 4.01 MIL/uL — AB (ref 4.22–5.81)
RDW: 12.7 % (ref 11.5–15.5)
WBC: 15.7 10*3/uL — AB (ref 4.0–10.5)

## 2014-01-11 LAB — COMPREHENSIVE METABOLIC PANEL
ALT: 17 U/L (ref 0–53)
AST: 20 U/L (ref 0–37)
Albumin: 2.6 g/dL — ABNORMAL LOW (ref 3.5–5.2)
Alkaline Phosphatase: 100 U/L (ref 39–117)
BILIRUBIN TOTAL: 2.2 mg/dL — AB (ref 0.3–1.2)
BUN: 7 mg/dL (ref 6–23)
CALCIUM: 9 mg/dL (ref 8.4–10.5)
CO2: 20 meq/L (ref 19–32)
Chloride: 98 mEq/L (ref 96–112)
Creatinine, Ser: 0.72 mg/dL (ref 0.50–1.35)
GLUCOSE: 262 mg/dL — AB (ref 70–99)
Potassium: 3.6 mEq/L — ABNORMAL LOW (ref 3.7–5.3)
Sodium: 133 mEq/L — ABNORMAL LOW (ref 137–147)
Total Protein: 7.2 g/dL (ref 6.0–8.3)

## 2014-01-11 LAB — URINE CULTURE: Colony Count: >=100000

## 2014-01-11 LAB — LACTIC ACID, PLASMA: Lactic Acid, Venous: 1 mmol/L (ref 0.5–2.2)

## 2014-01-11 LAB — PRO B NATRIURETIC PEPTIDE: Pro B Natriuretic peptide (BNP): 633.1 pg/mL — ABNORMAL HIGH (ref 0–125)

## 2014-01-11 LAB — HEMOGLOBIN A1C
HEMOGLOBIN A1C: 14.5 % — AB (ref <5.7)
MEAN PLASMA GLUCOSE: 369 mg/dL — AB (ref <117)

## 2014-01-11 MED ORDER — SODIUM CHLORIDE 0.9 % IV SOLN
1000.0000 mL | INTRAVENOUS | Status: DC
  Administered 2014-01-11 – 2014-01-12 (×2): 1000 mL via INTRAVENOUS

## 2014-01-11 MED ORDER — LIVING WELL WITH DIABETES BOOK
Freq: Once | Status: AC
  Administered 2014-01-11: 12:00:00
  Filled 2014-01-11: qty 1

## 2014-01-11 MED ORDER — POTASSIUM CHLORIDE CRYS ER 20 MEQ PO TBCR
40.0000 meq | EXTENDED_RELEASE_TABLET | Freq: Once | ORAL | Status: AC
  Administered 2014-01-11: 40 meq via ORAL
  Filled 2014-01-11: qty 2

## 2014-01-11 NOTE — Progress Notes (Signed)
Patient ID: Howard Mcmahon, male   DOB: [birth year], 43 y.o.   MRN: [id number]  [facility] PROGRESS NOTE  Howard Mcmahon [id number] DOB: August 26, 1971 DOA: 01/09/2014 PCP: No primary provider on file.  Brief narrative:  43 y.o. male with prior h/o hypertension, CAD s/p PCI, DM, OSA, Presented with fever and chills after lunch prior to this admission, associated with altered mental status. He was given tylenol and his fever resolved. He became more alert and oriented to place, person and time. He reported he had two episodes of headache resolved after sleeping in the last one week. He denies any chest pain, dyspnea, palpitations, orthopnea, pedal edema. He reports non productive cough that started one day PTA.   On arrival to ED, his CBG'S were elevated and he was found to have elevated anion gap. He was started on IV glucostabilizer by ED, and was put on IV antibiotics empirically. He was also put on schedule to undergo LP to evaluate for meningitis for his fever and altered mental status, but could not be done as he reported that he was on plavix. He is referred to medical service for admission and further recommendations.   Active Problems:  Sepsis  - satisfy criteria, on admission with leukocytosis, fever 103 F, tachycardia - appears to be secondary to E. Coli urosepsis - continue Zosyn day #3, stop Vancomycin  - follow up on blood culture  - awaiting for LP but apparently pt needs to be off Plavix for 5 days  Acute encephalopathy  - likely related to principal problem  - MRI unremarkable for acute events  - pt's mental status is clear this am and he feels better this AM Hypokalemia - mild, will supplement and repeat BMP in AM DKA (diabetic ketoacidoses)  - AG closed, discontinue insulin drip and place on long acing insulin  Type II or unspecified type diabetes mellitus with unspecified complication, uncontrolled  - A1C 14.5 - continue long acting insulin BID and add SSI while inpatient   - diabetic educator consultation  Essential hypertension, benign  - reasonable inpatient control  - continue Lisinopril  CAD (coronary artery disease)  - appears to be clinically compensated  - continue aspirin .  Consultants:  None Procedures/Studies:  CXR 01/09/2014 Peribronchial thickening without focal pneumonia.  Ct Head Wo Contrast 01/09/2014 Microvascular ischemic disease without definite acute intracranial process.  Mr Brain Wo Contrast 01/10/2014 Premature small vessel disease. No acute intracranial findings.  CXR Pulmonary venous congestion and perihilar/bibasilar atelectasis.  Antibiotics:  Vancomycin 5/2 --> 5/4 Zosyn 5/2 -->   Code Status: Full  Family Communication: Pt and wife at bedside  Disposition Plan: Home when medically stable   HPI/Subjective: No events overnight.   Objective: Filed Vitals:   01/11/14 0645 01/11/14 0758 01/11/14 0926 01/11/14 1303  BP: 128/76  118/68 118/67  Pulse: 106   71  Temp: 100.2 F (37.9 C) 98.6 F (37 C)  98.1 F (36.7 C)  TempSrc: Oral Oral  Oral  Resp: 22   20  Height:      Weight:      SpO2: 96%   96%    Intake/Output Summary (Last 24 hours) at 01/11/14 1440 Last data filed at 01/11/14 1000  Gross per 24 hour  Intake   3850 ml  Output   4425 ml  Net   -575 ml    Exam:   General:  Pt is alert, follows commands appropriately, not in acute distress  Cardiovascular: Regular rate and rhythm, S1/S2, no  murmurs, no rubs, no gallops  Respiratory: Clear to auscultation bilaterally, no wheezing, no crackles, no rhonchi  Abdomen: Soft, non tender, non distended, bowel sounds present, no guarding  Extremities: No edema, pulses DP and PT palpable bilaterally  Neuro: Grossly nonfocal  Data Reviewed: Basic Metabolic Panel:  Recent Labs Lab 01/09/14 1330 01/09/14 1828 01/10/14 0517 01/11/14 0445  NA 134* 138 137 133*  K 4.1 3.9 4.1 3.6*  CL 98 105 103 98  CO2 19 19 20 20   GLUCOSE 337* 304* 297* 262*  BUN 9 9  9 7   CREATININE 0.68 0.71 0.65 0.72  CALCIUM 9.2 8.4 9.0 9.0   Liver Function Tests:  Recent Labs Lab 01/09/14 1330 01/11/14 0445  AST 25 20  ALT 22 17  ALKPHOS 117 100  BILITOT 0.9 2.2*  PROT 7.3 7.2  ALBUMIN 3.0* 2.6*   CBC:  Recent Labs Lab 01/09/14 1330 01/10/14 0517 01/11/14 0445  WBC 12.4* 9.2 15.7*  NEUTROABS 10.9*  --   --   HGB 13.6 12.6* 12.6*  HCT 38.4* 36.5* 36.3*  MCV 90.6 91.3 90.5  PLT 153 146* 143*   Cardiac Enzymes: CBG:  Recent Labs Lab 01/10/14 1158 01/10/14 1654 01/10/14 2134 01/11/14 0740 01/11/14 1150  GLUCAP 235* 181* 214* 254* 271*    Recent Results (from the past 240 hour(s))  URINE CULTURE     Status: None   Collection Time    01/09/14  1:25 PM      Result Value Ref Range Status   Specimen Description URINE, CLEAN CATCH   Final   Special Requests NONE   Final   Culture  Setup Time     Final   Value: 01/09/2014 18:22     Performed at [facility]     Final   Value: >=100,000 COLONIES/ML     Performed at [facility]   Culture     Final   Value: ESCHERICHIA COLI     Performed at [facility]   Report Status 01/11/2014 FINAL   Final   Organism ID, Bacteria ESCHERICHIA COLI   Final  CULTURE, BLOOD (ROUTINE X 2)     Status: None   Collection Time    01/09/14  1:30 PM      Result Value Ref Range Status   Specimen Description BLOOD BLOOD RIGHT FOREARM   Final   Special Requests BOTTLES DRAWN AEROBIC AND ANAEROBIC 5CC EACH   Final   Culture  Setup Time     Final   Value: 01/09/2014 18:19     Performed at [facility]   Culture     Final   Value:        BLOOD CULTURE RECEIVED NO GROWTH TO DATE CULTURE WILL BE HELD FOR 5 DAYS BEFORE ISSUING A FINAL NEGATIVE REPORT     Performed at [facility]   Report Status PENDING   Incomplete  CULTURE, BLOOD (ROUTINE X 2)     Status: None   Collection Time    01/09/14  2:00 PM      Result Value Ref Range Status   Specimen  Description BLOOD BLOOD LEFT FOREARM   Final   Special Requests BOTTLES DRAWN AEROBIC AND ANAEROBIC 4CC EACH   Final   Culture  Setup Time     Final   Value: 01/09/2014 18:19     Performed at [facility]   Culture     Final   Value:  BLOOD CULTURE RECEIVED NO GROWTH TO DATE CULTURE WILL BE HELD FOR 5 DAYS BEFORE ISSUING A FINAL NEGATIVE REPORT     Performed at [facility]   Report Status PENDING   Incomplete     Scheduled Meds: . aspirin EC  81 mg Oral Daily  . enoxaparin (LOVENOX) injection  40 mg Subcutaneous Q24H  . insulin aspart  0-15 Units Subcutaneous TID WC  . insulin aspart  0-5 Units Subcutaneous QHS  . insulin aspart protamine- aspart  15 Units Subcutaneous BID WC  . lisinopril  20 mg Oral BID  . piperacillin-tazobactam (ZOSYN)  IV  3.375 g Intravenous 3 times per day   Continuous Infusions: . sodium chloride 1,000 mL (01/11/14 1222)     Theodis Blaze, MD  [facility] Pager [phone]  If 7PM-7AM, please contact night-coverage [email or web address] Password [email or web address] 01/11/2014, 2:40 PM   LOS: 2 days

## 2014-01-11 NOTE — Progress Notes (Signed)
Inpatient Diabetes Program Recommendations  AACE/ADA: New Consensus Statement on Inpatient Glycemic Control (2013)  Target Ranges:  Prepandial:   less than 140 mg/dL      Peak postprandial:   less than 180 mg/dL (1-2 hours)      Critically ill patients:  140 - 180 mg/dL   Reason for Visit: MD Consult  Diabetes history: Type 2 diabetes Outpatient Diabetes medications: 70/30 10 units twice daily Current orders for Inpatient glycemic control: 70/30 15 units twice daily  Note:  Met with patient, wife, and his mother.  Receptive to my visit.  Wife offered the most information.  She states they have lots of materials, meters, etc and know what they are supposed to do-- but have gotten out of the habit of doing it.  (Wife has diabetes as well).  Are not packing food to take when away from home, he is not taking both doses of 70/30-- taking am dose, but not pm dose, not walking as they once did.  Wife has been for diabetes education at the [facility], but he has not.  Suggested they consider this admission as a wake-up call to pay more attention to the health of them both.  The expense of test strips is a barrier to them checking blood sugars-- suggested the generic meter from [facility] that costs < $17 and test strips cost $9 for 50.    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  Informed them insulin did not have to be a room temperature-- bottle he is using can be at room temperature.  (He said he had been told this before, but he didn't believe it.)  Discussed keeping it in his car, but never leaving it there when too hot.  He thinks he would rather put it in a cooler with food- cautioned him against putting insulin directly on ice.  Wife requested any materials the hospital may have available to them.  Ordered the "Living Well with Diabetes" booklet.  Informed them regarding access to the Patient Education Network.  Will ask nursing staff to review diabetes self-care with  patient and give any other appropriate materials.  Results for NAJEEB, UPTAIN (MRN [id number]) as of 01/11/2014 11:36  Ref. Range 01/10/2014 07:30 01/10/2014 11:58 01/10/2014 16:54 01/10/2014 21:34 01/11/2014 07:40  Glucose-Capillary Latest Range: 70-99 mg/dL 276 (H) 235 (H) 181 (H) 214 (H) 254 (H)   Request MD consider the following:  Increase 70/30 dose to 20 or 25 units BID with meals  On discharge summary, request patient's PCP at the [facility] refer him for outpatient diabetes education follow-up.  Thank you.  Tamsyn Owusu S. Marcelline Mates, RN, CNS, CDE Inpatient Diabetes Program, team pager [phone]

## 2014-01-11 NOTE — Progress Notes (Signed)
Pt refuses to wear CPAP tonight. He stated he did not tolerate it on a previous night because it is not like his machine at home. Pt encouraged to call RT if pt changes mind. RN aware. Pt in no distress.

## 2014-01-12 LAB — CBC
HCT: 32.5 % — ABNORMAL LOW (ref 39.0–52.0)
Hemoglobin: 11.1 g/dL — ABNORMAL LOW (ref 13.0–17.0)
MCH: 31 pg (ref 26.0–34.0)
MCHC: 34.2 g/dL (ref 30.0–36.0)
MCV: 90.8 fL (ref 78.0–100.0)
Platelets: 176 10*3/uL (ref 150–400)
RBC: 3.58 MIL/uL — ABNORMAL LOW (ref 4.22–5.81)
RDW: 12.8 % (ref 11.5–15.5)
WBC: 10.1 10*3/uL (ref 4.0–10.5)

## 2014-01-12 LAB — BASIC METABOLIC PANEL
BUN: 10 mg/dL (ref 6–23)
CO2: 23 mEq/L (ref 19–32)
Calcium: 8.9 mg/dL (ref 8.4–10.5)
Chloride: 102 mEq/L (ref 96–112)
Creatinine, Ser: 0.71 mg/dL (ref 0.50–1.35)
Glucose, Bld: 238 mg/dL — ABNORMAL HIGH (ref 70–99)
POTASSIUM: 3.6 meq/L — AB (ref 3.7–5.3)
SODIUM: 137 meq/L (ref 137–147)

## 2014-01-12 LAB — GLUCOSE, CAPILLARY: GLUCOSE-CAPILLARY: 250 mg/dL — AB (ref 70–99)

## 2014-01-12 MED ORDER — CIPROFLOXACIN HCL 500 MG PO TABS: 500.0000 mg | ORAL_TABLET | Freq: Two times a day (BID) | ORAL | Status: DC

## 2014-01-12 MED ORDER — OXYCODONE HCL 5 MG PO TABS: 5.0000 mg | ORAL_TABLET | ORAL | Status: DC | PRN

## 2014-01-12 MED ORDER — SENNOSIDES-DOCUSATE SODIUM 8.6-50 MG PO TABS: 1.0000 | ORAL_TABLET | Freq: Two times a day (BID) | ORAL | Status: DC

## 2014-01-12 NOTE — Discharge Summary (Signed)
Physician Discharge Summary  Howard Mcmahon [id number] DOB: 05-23-1971 DOA: 01/09/2014  PCP: No primary provider on file.  Admit date: 01/09/2014 Discharge date: 01/12/2014  Recommendations for Outpatient Follow-up:  1. Pt will need to follow up with PCP in 2-3 weeks post discharge 2. Please obtain BMP to evaluate electrolytes and kidney function 3. Please also check CBC to evaluate Hg and Hct levels 4. Pt discharged on Ciprofloxacin for UTI upon discharge   Discharge Diagnoses: UTI, E. Coli  Active Problems:   DKA (diabetic ketoacidoses)   Type II or unspecified type diabetes mellitus with unspecified complication, uncontrolled   Essential hypertension, benign   CAD (coronary artery disease)   Fever presenting with conditions classified elsewhere   Leukocytosis, unspecified   Elevated lactic acid level  Discharge Condition: Stable  Diet recommendation: Heart healthy diet discussed in details   Brief narrative:  43 y.o. male with prior h/o hypertension, CAD s/p PCI, DM, OSA, Presented with fever and chills after lunch prior to this admission, associated with altered mental status. He was given tylenol and his fever resolved. He became more alert and oriented to place, person and time. He reported he had two episodes of headache resolved after sleeping in the last one week. He denies any chest pain, dyspnea, palpitations, orthopnea, pedal edema. He reports non productive cough that started one day PTA.   On arrival to ED, his CBG'S were elevated and he was found to have elevated anion gap. He was started on IV glucostabilizer by ED, and was put on IV antibiotics empirically. He was also put on schedule to undergo LP to evaluate for meningitis for his fever and altered mental status, but could not be done as he reported that he was on plavix. He is referred to medical service for admission and further recommendations.   Active Problems:  Sepsis  - satisfy criteria, on admission with  leukocytosis, fever 103 F, tachycardia  - appears to be secondary to E. Coli urosepsis  - continue Zosyn day #4, stopped Vancomycin  - pt transitioned to oral Ciprofloxacin upon discharge  - follow up on blood culture which are negative to date  - pt does not want to have LP done Acute encephalopathy  - likely related to principal problem  - MRI unremarkable for acute events  - pt's mental status is clear this am and he feels better this AM  - wants to go home today  Hypokalemia  - supplemented and WNL  DKA (diabetic ketoacidoses)  - AG closed, discontinued insulin drip - continue home medical regimen  - pt tolerating current diet well  Type II or unspecified type diabetes mellitus with unspecified complication, uncontrolled  - A1C 14.5  - continue long acting insulin BID as per home medical regimen  - continue metformin as well  Essential hypertension, benign  - reasonable inpatient control  - continue Lisinopril  CAD (coronary artery disease)  - appears to be clinically compensated  - continue aspirin   Consultants:  None Procedures/Studies:  CXR 01/09/2014 Peribronchial thickening without focal pneumonia.  Ct Head Wo Contrast 01/09/2014 Microvascular ischemic disease without definite acute intracranial process.  Mr Brain Wo Contrast 01/10/2014 Premature small vessel disease. No acute intracranial findings.  CXR Pulmonary venous congestion and perihilar/bibasilar atelectasis.  Antibiotics:  Vancomycin 5/2 --> 5/4  Zosyn 5/2 --> 5/5 Ciprofloxacin 5/5  Code Status: Full  Family Communication: Pt and wife at bedside  Disposition Plan: Home today    Discharge Exam: Filed Vitals:  01/12/14 0413  BP: 110/66  Pulse: 79  Temp: 98.5 F (36.9 C)  Resp: 20   Filed Vitals:   01/11/14 0926 01/11/14 1303 01/12/14 0030 01/12/14 0413  BP: 118/68 129/73  110/66  Pulse:  97  79  Temp:  100.2 F (37.9 C) 98.3 F (36.8 C) 98.5 F (36.9 C)  TempSrc:  Oral  Oral  Resp:  20  20   Height:      Weight:      SpO2:  97%      General: Pt is alert, follows commands appropriately, not in acute distress Cardiovascular: Regular rate and rhythm, S1/S2 +, no murmurs, no rubs, no gallops Respiratory: Clear to auscultation bilaterally, no wheezing, no crackles, no rhonchi Abdominal: Soft, non tender, non distended, bowel sounds +, no guarding Extremities: no edema, no cyanosis, pulses palpable bilaterally DP and PT Neuro: Grossly nonfocal  Discharge Instructions  Discharge Orders   Future Orders Complete By Expires   Diet - low sodium heart healthy  As directed    Increase activity slowly  As directed        Medication List         aspirin EC 81 MG tablet  Take 81 mg by mouth daily.     ciprofloxacin 500 MG tablet  Commonly known as:  CIPRO  Take 1 tablet (500 mg total) by mouth 2 (two) times daily.     insulin NPH-regular Human (70-30) 100 UNIT/ML injection  Commonly known as:  NOVOLIN 70/30  Inject 10 Units into the skin 2 (two) times daily with a meal.     lisinopril 20 MG tablet  Commonly known as:  PRINIVIL,ZESTRIL  Take 20 mg by mouth 2 (two) times daily.     metFORMIN 1000 MG tablet  Commonly known as:  GLUCOPHAGE  Take 1,000 mg by mouth 2 (two) times daily with a meal.     oxyCODONE 5 MG immediate release tablet  Commonly known as:  Oxy IR/ROXICODONE  Take 1 tablet (5 mg total) by mouth every 4 (four) hours as needed for moderate pain.     senna-docusate 8.6-50 MG per tablet  Commonly known as:  Senokot-S  Take 1 tablet by mouth 2 (two) times daily.           Follow-up Information   Schedule an appointment as soon as possible for a visit with Faye Ramsay, MD. (As needed, If symptoms worsen, call my cell phone [phone])    Specialty:  Internal Medicine   Contact information:   [address] [place] [address] [phone]        The results of significant diagnostics from this hospitalization (including  imaging, microbiology, ancillary and laboratory) are listed below for reference.     Microbiology: Recent Results (from the past 240 hour(s))  URINE CULTURE     Status: None   Collection Time    01/09/14  1:25 PM      Result Value Ref Range Status   Specimen Description URINE, CLEAN CATCH   Final   Special Requests NONE   Final   Culture  Setup Time     Final   Value: 01/09/2014 18:22     Performed at [place]     Final   Value: >=100,000 COLONIES/ML     Performed at [facility]   Culture     Final   Value: ESCHERICHIA COLI     Performed at [facility]  Report Status 01/11/2014 FINAL   Final   Organism ID, Bacteria ESCHERICHIA COLI   Final  CULTURE, BLOOD (ROUTINE X 2)     Status: None   Collection Time    01/09/14  1:30 PM      Result Value Ref Range Status   Specimen Description BLOOD BLOOD RIGHT FOREARM   Final   Special Requests BOTTLES DRAWN AEROBIC AND ANAEROBIC 5CC EACH   Final   Culture  Setup Time     Final   Value: 01/09/2014 18:19     Performed at [facility]   Culture     Final   Value:        BLOOD CULTURE RECEIVED NO GROWTH TO DATE CULTURE WILL BE HELD FOR 5 DAYS BEFORE ISSUING A FINAL NEGATIVE REPORT     Performed at [facility]   Report Status PENDING   Incomplete  CULTURE, BLOOD (ROUTINE X 2)     Status: None   Collection Time    01/09/14  2:00 PM      Result Value Ref Range Status   Specimen Description BLOOD BLOOD LEFT FOREARM   Final   Special Requests BOTTLES DRAWN AEROBIC AND ANAEROBIC 4CC EACH   Final   Culture  Setup Time     Final   Value: 01/09/2014 18:19     Performed at [facility]   Culture     Final   Value:        BLOOD CULTURE RECEIVED NO GROWTH TO DATE CULTURE WILL BE HELD FOR 5 DAYS BEFORE ISSUING A FINAL NEGATIVE REPORT     Performed at [facility]   Report Status PENDING   Incomplete  CULTURE, BLOOD (ROUTINE X 2)     Status: None   Collection Time     01/10/14  8:30 PM      Result Value Ref Range Status   Specimen Description BLOOD RIGHT ARM   Final   Special Requests BOTTLES DRAWN AEROBIC AND ANAEROBIC 10CC   Final   Culture  Setup Time     Final   Value: 01/11/2014 02:51     Performed at [facility]   Culture     Final   Value:        BLOOD CULTURE RECEIVED NO GROWTH TO DATE CULTURE WILL BE HELD FOR 5 DAYS BEFORE ISSUING A FINAL NEGATIVE REPORT     Performed at [facility]   Report Status PENDING   Incomplete  CULTURE, BLOOD (ROUTINE X 2)     Status: None   Collection Time    01/10/14  8:32 PM      Result Value Ref Range Status   Specimen Description BLOOD RIGHT HAND   Final   Special Requests BOTTLES DRAWN AEROBIC ONLY 10CC   Final   Culture  Setup Time     Final   Value: 01/11/2014 02:51     Performed at [facility]   Culture     Final   Value:        BLOOD CULTURE RECEIVED NO GROWTH TO DATE CULTURE WILL BE HELD FOR 5 DAYS BEFORE ISSUING A FINAL NEGATIVE REPORT     Performed at [facility]   Report Status PENDING   Incomplete     Labs: Basic Metabolic Panel:  Recent Labs Lab 01/09/14 1330 01/09/14 1828 01/10/14 0517 01/11/14 0445 01/12/14 0315  NA 134* 138 137 133* 137  K 4.1 3.9 4.1 3.6*  3.6*  CL 98 105 103 98 102  CO2 19 19 20 20 23   GLUCOSE 337* 304* 297* 262* 238*  BUN 9 9 9 7 10   CREATININE 0.68 0.71 0.65 0.72 0.71  CALCIUM 9.2 8.4 9.0 9.0 8.9   Liver Function Tests:  Recent Labs Lab 01/09/14 1330 01/11/14 0445  AST 25 20  ALT 22 17  ALKPHOS 117 100  BILITOT 0.9 2.2*  PROT 7.3 7.2  ALBUMIN 3.0* 2.6*   CBC:  Recent Labs Lab 01/09/14 1330 01/10/14 0517 01/11/14 0445 01/12/14 0315  WBC 12.4* 9.2 15.7* 10.1  NEUTROABS 10.9*  --   --   --   HGB 13.6 12.6* 12.6* 11.1*  HCT 38.4* 36.5* 36.3* 32.5*  MCV 90.6 91.3 90.5 90.8  PLT 153 146* 143* 176   BNP (last 3 results)  Recent Labs  01/11/14 0445  PROBNP 633.1*   CBG:  Recent Labs Lab  01/11/14 0740 01/11/14 1150 01/11/14 1658 01/11/14 2156 01/12/14 0740  GLUCAP 254* 271* 216* 237* 250*   SIGNED: Time coordinating discharge: Over 30 minutes  Theodis Blaze, MD  [facility] 01/12/2014, 11:38 AM Pager [phone]  If 7PM-7AM, please contact night-coverage [email or web address] Password [email or web address]

## 2014-01-12 NOTE — Discharge Instructions (Signed)
Urinary Tract Infection  Urinary tract infections (UTIs) can develop anywhere along your urinary tract. Your urinary tract is your body's drainage system for removing wastes and extra water. Your urinary tract includes two kidneys, two ureters, a bladder, and a urethra. Your kidneys are a pair of bean-shaped organs. Each kidney is about the size of your fist. They are located below your ribs, one on each side of your spine.  CAUSES  Infections are caused by microbes, which are microscopic organisms, including fungi, viruses, and bacteria. These organisms are so small that they can only be seen through a microscope. Bacteria are the microbes that most commonly cause UTIs.  SYMPTOMS   Symptoms of UTIs may vary by age and gender of the patient and by the location of the infection. Symptoms in [place] women typically include a frequent and intense urge to urinate and a painful, burning feeling in the bladder or urethra during urination. Older women and men are more likely to be tired, shaky, and weak and have muscle aches and abdominal pain. A fever may mean the infection is in your kidneys. Other symptoms of a kidney infection include pain in your back or sides below the ribs, nausea, and vomiting.  DIAGNOSIS  To diagnose a UTI, your caregiver will ask you about your symptoms. Your caregiver also will ask to provide a urine sample. The urine sample will be tested for bacteria and white blood cells. White blood cells are made by your body to help fight infection.  TREATMENT   Typically, UTIs can be treated with medication. Because most UTIs are caused by a bacterial infection, they usually can be treated with the use of antibiotics. The choice of antibiotic and length of treatment depend on your symptoms and the type of bacteria causing your infection.  HOME CARE INSTRUCTIONS   If you were prescribed antibiotics, take them exactly as your caregiver instructs you. Finish the medication even if you feel better after you  have only taken some of the medication.   Drink enough water and fluids to keep your urine clear or pale yellow.   Avoid caffeine, tea, and carbonated beverages. They tend to irritate your bladder.   Empty your bladder often. Avoid holding urine for long periods of time.   Empty your bladder before and after sexual intercourse.   After a bowel movement, women should cleanse from front to back. Use each tissue only once.  SEEK MEDICAL CARE IF:    You have back pain.   You develop a fever.   Your symptoms do not begin to resolve within 3 days.  SEEK IMMEDIATE MEDICAL CARE IF:    You have severe back pain or lower abdominal pain.   You develop chills.   You have nausea or vomiting.   You have continued burning or discomfort with urination.  MAKE SURE YOU:    Understand these instructions.   Will watch your condition.   Will get help right away if you are not doing well or get worse.  Document Released: 06/06/2005 Document Revised: 02/26/2012 Document Reviewed: 10/05/2011  ExitCare Patient Information 2014 [employer].

## 2014-01-12 NOTE — Progress Notes (Signed)
PT Cancellation Note  Patient Details Name: Zacharia Hagarty MRN: [id number] DOB: 1971-04-05   Cancelled Treatment:    Reason Eval/Treat Not Completed: PT screened for second time, no needs identified, will sign off (SPOKE WITH RN Gordon NOT NEED PT)   Claretha Cooper 01/12/2014, 9:11 AM Tresa Endo PT [phone]

## 2014-01-15 LAB — CULTURE, BLOOD (ROUTINE X 2)
CULTURE: NO GROWTH
Culture: NO GROWTH

## 2014-01-17 LAB — CULTURE, BLOOD (ROUTINE X 2)
Culture: NO GROWTH
Culture: NO GROWTH

## 2014-01-25 ENCOUNTER — Ambulatory Visit: Payer: Self-pay | Attending: Internal Medicine | Admitting: Internal Medicine

## 2014-01-25 ENCOUNTER — Encounter: Payer: Self-pay | Admitting: Internal Medicine

## 2014-01-25 VITALS — BP 140/90 | HR 80 | Temp 98.8°F | Resp 16 | Wt 280.0 lb

## 2014-01-25 DIAGNOSIS — I10 Essential (primary) hypertension: Secondary | ICD-10-CM | POA: Insufficient documentation

## 2014-01-25 DIAGNOSIS — Z7902 Long term (current) use of antithrombotics/antiplatelets: Secondary | ICD-10-CM | POA: Insufficient documentation

## 2014-01-25 DIAGNOSIS — N390 Urinary tract infection, site not specified: Secondary | ICD-10-CM | POA: Insufficient documentation

## 2014-01-25 DIAGNOSIS — E118 Type 2 diabetes mellitus with unspecified complications: Principal | ICD-10-CM

## 2014-01-25 DIAGNOSIS — Z79899 Other long term (current) drug therapy: Secondary | ICD-10-CM | POA: Insufficient documentation

## 2014-01-25 DIAGNOSIS — I2510 Atherosclerotic heart disease of native coronary artery without angina pectoris: Secondary | ICD-10-CM | POA: Insufficient documentation

## 2014-01-25 DIAGNOSIS — F17200 Nicotine dependence, unspecified, uncomplicated: Secondary | ICD-10-CM | POA: Insufficient documentation

## 2014-01-25 DIAGNOSIS — Z794 Long term (current) use of insulin: Secondary | ICD-10-CM | POA: Insufficient documentation

## 2014-01-25 DIAGNOSIS — G4733 Obstructive sleep apnea (adult) (pediatric): Secondary | ICD-10-CM | POA: Insufficient documentation

## 2014-01-25 DIAGNOSIS — Z7982 Long term (current) use of aspirin: Secondary | ICD-10-CM | POA: Insufficient documentation

## 2014-01-25 DIAGNOSIS — E119 Type 2 diabetes mellitus without complications: Secondary | ICD-10-CM

## 2014-01-25 DIAGNOSIS — E1165 Type 2 diabetes mellitus with hyperglycemia: Secondary | ICD-10-CM | POA: Insufficient documentation

## 2014-01-25 DIAGNOSIS — IMO0002 Reserved for concepts with insufficient information to code with codable children: Secondary | ICD-10-CM | POA: Insufficient documentation

## 2014-01-25 LAB — GLUCOSE, POCT (MANUAL RESULT ENTRY): POC Glucose: 285 mg/dl — AB (ref 70–99)

## 2014-01-25 NOTE — Patient Instructions (Addendum)
Diabetes Meal Planning Guide The diabetes meal planning guide is a tool to help you plan your meals and snacks. It is important for people with diabetes to manage their blood glucose (sugar) levels. Choosing the right foods and the right amounts throughout your day will help control your blood glucose. Eating right can even help you improve your blood pressure and reach or maintain a healthy weight. CARBOHYDRATE COUNTING MADE EASY When you eat carbohydrates, they turn to sugar. This raises your blood glucose level. Counting carbohydrates can help you control this level so you feel better. When you plan your meals by counting carbohydrates, you can have more flexibility in what you eat and balance your medicine with your food intake. Carbohydrate counting simply means adding up the total amount of carbohydrate grams in your meals and snacks. Try to eat about the same amount at each meal. Foods with carbohydrates are listed below. Each portion below is 1 carbohydrate serving or 15 grams of carbohydrates. Ask your dietician how many grams of carbohydrates you should eat at each meal or snack. Grains and Starches  1 slice bread.   English muffin or hotdog/hamburger bun.   cup cold cereal (unsweetened).   cup cooked pasta or rice.   cup starchy vegetables (corn, potatoes, peas, beans, winter squash).  1 tortilla (6 inches).   bagel.  1 waffle or pancake (size of a CD).   cup cooked cereal.  4 to 6 small crackers. *Whole grain is recommended. Fruit  1 cup fresh unsweetened berries, melon, papaya, pineapple.  1 small fresh fruit.   banana or mango.   cup fruit juice (4 oz unsweetened).   cup canned fruit in natural juice or water.  2 tbs dried fruit.  12 to 15 grapes or cherries. Milk and Yogurt  1 cup fat-free or 1% milk.  1 cup soy milk.  6 oz light yogurt with sugar-free sweetener.  6 oz low-fat soy yogurt.  6 oz plain yogurt. Vegetables  1 cup raw or  cup  cooked is counted as 0 carbohydrates or a "free" food.  If you eat 3 or more servings at 1 meal, count them as 1 carbohydrate serving. Other Carbohydrates   oz chips or pretzels.   cup ice cream or frozen yogurt.   cup sherbet or sorbet.  2 inch square cake, no frosting.  1 tbs honey, sugar, jam, jelly, or syrup.  2 small cookies.  3 squares of graham crackers.  3 cups popcorn.  6 crackers.  1 cup broth-based soup.  Count 1 cup casserole or other mixed foods as 2 carbohydrate servings.  Foods with less than 20 calories in a serving may be counted as 0 carbohydrates or a "free" food. You may want to purchase a book or computer software that lists the carbohydrate gram counts of different foods. In addition, the nutrition facts panel on the labels of the foods you eat are a good source of this information. The label will tell you how big the serving size is and the total number of carbohydrate grams you will be eating per serving. Divide this number by 15 to obtain the number of carbohydrate servings in a portion. Remember, 1 carbohydrate serving equals 15 grams of carbohydrate. SERVING SIZES Measuring foods and serving sizes helps you make sure you are getting the right amount of food. The list below tells how big or small some common serving sizes are.  1 oz.........4 stacked dice.  3 oz.........Deck of cards.  1 tsp........Tip   of little finger.  1 tbs........Thumb.  2 tbs........Golf ball.   cup.......Half of a fist.  1 cup........A fist. SAMPLE DIABETES MEAL PLAN Below is a sample meal plan that includes foods from the grain and starches, dairy, vegetable, fruit, and meat groups. A dietician can individualize a meal plan to fit your calorie needs and tell you the number of servings needed from each food group. However, controlling the total amount of carbohydrates in your meal or snack is more important than making sure you include all of the food groups at every  meal. You may interchange carbohydrate containing foods (dairy, starches, and fruits). The meal plan below is an example of a 2000 calorie diet using carbohydrate counting. This meal plan has 17 carbohydrate servings. Breakfast  1 cup oatmeal (2 carb servings).   cup light yogurt (1 carb serving).  1 cup blueberries (1 carb serving).   cup almonds. Snack  1 large apple (2 carb servings).  1 low-fat string cheese stick. Lunch  Chicken breast salad.  1 cup spinach.   cup chopped tomatoes.  2 oz chicken breast, sliced.  2 tbs low-fat Italian dressing.  12 whole-wheat crackers (2 carb servings).  12 to 15 grapes (1 carb serving).  1 cup low-fat milk (1 carb serving). Snack  1 cup carrots.   cup hummus (1 carb serving). Dinner  3 oz broiled salmon.  1 cup brown rice (3 carb servings). Snack  1  cups steamed broccoli (1 carb serving) drizzled with 1 tsp olive oil and lemon juice.  1 cup light pudding (2 carb servings). DIABETES MEAL PLANNING WORKSHEET Your dietician can use this worksheet to help you decide how many servings of foods and what types of foods are right for you.  BREAKFAST Food Group and Servings / Carb Servings Grain/Starches __________________________________ Dairy __________________________________________ Vegetable ______________________________________ Fruit ___________________________________________ Meat __________________________________________ Fat ____________________________________________ LUNCH Food Group and Servings / Carb Servings Grain/Starches ___________________________________ Dairy ___________________________________________ Fruit ____________________________________________ Meat ___________________________________________ Fat _____________________________________________ DINNER Food Group and Servings / Carb Servings Grain/Starches ___________________________________ Dairy  ___________________________________________ Fruit ____________________________________________ Meat ___________________________________________ Fat _____________________________________________ SNACKS Food Group and Servings / Carb Servings Grain/Starches ___________________________________ Dairy ___________________________________________ Vegetable _______________________________________ Fruit ____________________________________________ Meat ___________________________________________ Fat _____________________________________________ DAILY TOTALS Starches _________________________ Vegetable ________________________ Fruit ____________________________ Dairy ____________________________ Meat ____________________________ Fat ______________________________ Document Released: 05/24/2005 Document Revised: 11/19/2011 Document Reviewed: 04/04/2009 ExitCare Patient Information 2014 [employer]. DASH Diet The DASH diet stands for "Dietary Approaches to Stop Hypertension." It is a healthy eating plan that has been shown to reduce high blood pressure (hypertension) in as little as 14 days, while also possibly providing other significant health benefits. These other health benefits include reducing the risk of breast cancer after menopause and reducing the risk of type 2 diabetes, heart disease, colon cancer, and stroke. Health benefits also include weight loss and slowing kidney failure in patients with chronic kidney disease.  DIET GUIDELINES  Limit salt (sodium). Your diet should contain less than 1500 mg of sodium daily.  Limit refined or processed carbohydrates. Your diet should include mostly whole grains. Desserts and added sugars should be used sparingly.  Include small amounts of heart-healthy fats. These types of fats include nuts, oils, and tub margarine. Limit saturated and trans fats. These fats have been shown to be harmful in the body. CHOOSING FOODS  The following food groups  are based on a 2000 calorie diet. See your Registered Dietitian for individual calorie needs. Grains and Grain Products (6 to 8 servings daily)  Eat More Often:   Whole-wheat bread, brown rice, whole-grain or wheat pasta, quinoa, popcorn without added fat or salt (air popped).  Eat Less Often: White bread, white pasta, white rice, cornbread. Vegetables (4 to 5 servings daily)  Eat More Often: Fresh, frozen, and canned vegetables. Vegetables may be raw, steamed, roasted, or grilled with a minimal amount of fat.  Eat Less Often/Avoid: Creamed or fried vegetables. Vegetables in a cheese sauce. Fruit (4 to 5 servings daily)  Eat More Often: All fresh, canned (in natural juice), or frozen fruits. Dried fruits without added sugar. One hundred percent fruit juice ( cup [237 mL] daily).  Eat Less Often: Dried fruits with added sugar. Canned fruit in light or heavy syrup. Lean Meats, Fish, and Poultry (2 servings or less daily. One serving is 3 to 4 oz [85-114 g]).  Eat More Often: Ninety percent or leaner ground beef, tenderloin, sirloin. Round cuts of beef, chicken breast, turkey breast. All fish. Grill, bake, or broil your meat. Nothing should be fried.  Eat Less Often/Avoid: Fatty cuts of meat, turkey, or chicken leg, thigh, or wing. Fried cuts of meat or fish. Dairy (2 to 3 servings)  Eat More Often: Low-fat or fat-free milk, low-fat plain or light yogurt, reduced-fat or part-skim cheese.  Eat Less Often/Avoid: Milk (whole, 2%).Whole milk yogurt. Full-fat cheeses. Nuts, Seeds, and Legumes (4 to 5 servings per week)  Eat More Often: All without added salt.  Eat Less Often/Avoid: Salted nuts and seeds, canned beans with added salt. Fats and Sweets (limited)  Eat More Often: Vegetable oils, tub margarines without trans fats, sugar-free gelatin. Mayonnaise and salad dressings.  Eat Less Often/Avoid: Coconut oils, palm oils, butter, stick margarine, cream, half and half, cookies, candy,  pie. FOR MORE INFORMATION The Dash Diet Eating Plan: www.dashdiet.org Document Released: 08/16/2011 Document Revised: 11/19/2011 Document Reviewed: 08/16/2011 ExitCare Patient Information 2014 [employer].  

## 2014-01-25 NOTE — Progress Notes (Signed)
Patient here for follow up on his DM and UTI Blood sugar is elevated but patient states has not taken his insulin  Yet today

## 2014-01-25 NOTE — Progress Notes (Signed)
Patient Demographics  Howard Mcmahon, is a 43 y.o. male  [id number]  [id number]  DOB - June 11, 1971  CC:  Chief Complaint  Patient presents with  . Follow-up       HPI: Howard Mcmahon is a 43 y.o. male here today to establish medical care.Patient has history of CAD, diabetes hypertension OSA, recently hospitalized with symptoms of fever chills altered mental status, EMR reviewed, patient was found to be in urosepsis, urine was positive for Escherichia coli, with leukocytosis fever and tachycardia, patient was treated with IV antibiotics and IV hydration, MRI of head  unremarkable patient symptomatically improved, was discharged on oral antibiotic, DKA was treated with her insulin drip, his A1c was 14.5 and was discharged on insulin twice a day and metformin. Patient denies any fever chills, she currently follows up with the cardiologist at [facility]. Patient is to smoke cigarettes, I have advised patient to quit smoking patient already has nicotine patch. He has not taken his medications today his blood sugar and blood pressure was elevated, repeat manual blood pressure is 140/90. Patient has No headache, No chest pain, No abdominal pain - No Nausea, No new weakness tingling or numbness, No Cough - SOB.  No Known Allergies Past Medical History  Diagnosis Date  . Diabetes mellitus without complication   . Hypertension   . Macular infarction     2012   Current Outpatient Prescriptions on File Prior to Visit  Medication Sig Dispense Refill  . aspirin EC 81 MG tablet Take 81 mg by mouth daily.      . ciprofloxacin (CIPRO) 500 MG tablet Take 1 tablet (500 mg total) by mouth 2 (two) times daily.  20 tablet  0  . insulin NPH-regular Human (NOVOLIN 70/30) (70-30) 100 UNIT/ML injection Inject 10 Units into the skin 2 (two) times daily with a meal.      . lisinopril (PRINIVIL,ZESTRIL) 20 MG tablet Take 20 mg by mouth 2 (two) times daily.      . metFORMIN (GLUCOPHAGE) 1000 MG tablet Take  1,000 mg by mouth 2 (two) times daily with a meal.      . oxyCODONE (OXY IR/ROXICODONE) 5 MG immediate release tablet Take 1 tablet (5 mg total) by mouth every 4 (four) hours as needed for moderate pain.  90 tablet  0  . senna-docusate (SENOKOT-S) 8.6-50 MG per tablet Take 1 tablet by mouth 2 (two) times daily.  60 tablet  0   No current facility-administered medications on file prior to visit.   Family History  Problem Relation Age of Onset  . Stroke Mother    History   Social History  . Marital Status: Widowed    Spouse Name: N/A    Number of Children: N/A  . Years of Education: N/A   Occupational History  . Not on file.   Social History Main Topics  . Smoking status: Current Every Day Smoker -- 2.00 packs/day for 30 years    Types: Cigarettes  . Smokeless tobacco: Not on file  . Alcohol Use: No  . Drug Use: No  . Sexual Activity: Not on file   Other Topics Concern  . Not on file   Social History Narrative  . No narrative on file    Review of Systems: Constitutional: Negative for fever, chills, diaphoresis, activity change, appetite change and fatigue. HENT: Negative for ear pain, nosebleeds, congestion, facial swelling, rhinorrhea, neck pain, neck stiffness and ear discharge.  Eyes: Negative for pain, discharge, redness, itching and  visual disturbance. Respiratory: Negative for cough, choking, chest tightness, shortness of breath, wheezing and stridor.  Cardiovascular: Negative for chest pain, palpitations and leg swelling. Gastrointestinal: Negative for abdominal distention. Genitourinary: Negative for dysuria, urgency, frequency, hematuria, flank pain, decreased urine volume, difficulty urinating and dyspareunia.  Musculoskeletal: Negative for back pain, joint swelling, arthralgia and gait problem. Neurological: Negative for dizziness, tremors, seizures, syncope, facial asymmetry, speech difficulty, weakness, light-headedness, numbness and headaches.  Hematological:  Negative for adenopathy. Does not bruise/bleed easily. Psychiatric/Behavioral: Negative for hallucinations, behavioral problems, confusion, dysphoric mood, decreased concentration and agitation.    Objective:   Filed Vitals:   01/25/14 1143  BP: 140/90  Pulse:   Temp:   Resp:     Physical Exam: Constitutional: Patient appears well-developed and well-nourished. No distress. HENT: Normocephalic, atraumatic, External right and left ear normal. Oropharynx is clear and moist.  Eyes: Conjunctivae and EOM are normal. PERRLA, no scleral icterus. Neck: Normal ROM. Neck supple. No JVD. No tracheal deviation. No thyromegaly. CVS: RRR, S1/S2 +, no murmurs, no gallops, no carotid bruit.  Pulmonary: Effort and breath sounds normal, no stridor, rhonchi, wheezes, rales.  Abdominal: Soft. BS +, no distension, tenderness, rebound or guarding.  Musculoskeletal: Normal range of motion. No edema and no tenderness.  Neuro: Alert. Normal reflexes, muscle tone coordination. No cranial nerve deficit. Skin: Skin is warm and dry. No rash noted. Not diaphoretic. No erythema. No pallor.  Lab Results  Component Value Date   WBC 10.1 01/12/2014   HGB 11.1* 01/12/2014   HCT 32.5* 01/12/2014   MCV 90.8 01/12/2014   PLT 176 01/12/2014   Lab Results  Component Value Date   CREATININE 0.71 01/12/2014   BUN 10 01/12/2014   NA 137 01/12/2014   K 3.6* 01/12/2014   CL 102 01/12/2014   CO2 23 01/12/2014    Lab Results  Component Value Date   HGBA1C 14.5* 01/10/2014   Lipid Panel  No results found for this basename: chol, trig, hdl, cholhdl, vldl, ldlcalc       Assessment and plan:   1. DM (diabetes mellitus) Results for orders placed in visit on 01/25/14  GLUCOSE, POCT (MANUAL RESULT ENTRY)      Result Value Ref Range   POC Glucose 285 (*) 70 - 99 mg/dl    Diabetes is uncontrolled I have advised patient for diabetes meal planning, also he will increase the insulin 70/30-15 units twice a day, I have advised patient to  keep the fingerstick log also if his fasting blood sugar is more than 1 50 mg/dL he will increase the insulin to 17 units twice a day.  2. CAD (coronary artery disease) Currently stable following up with her cardiologist and is on aspirin Plavix. ACE inhibitor. Will check his lipid panel on the next visit  3. Essential hypertension, benign Borderline elevated continue with lisinopril 20 mg, advise for DASH diet.  4. UTI (urinary tract infection) Improved and treated with antibiotics.  5. Smoking Patient has nicotine patch, have advised patient to start using them and try to quit smoking.  Return in about 3 months (around 04/27/2014) for diabetes, hypertension.   Lorayne Marek, MD

## 2014-08-26 ENCOUNTER — Encounter (HOSPITAL_COMMUNITY): Payer: Self-pay

## 2014-08-26 ENCOUNTER — Inpatient Hospital Stay (HOSPITAL_COMMUNITY): Payer: Self-pay

## 2014-08-26 ENCOUNTER — Emergency Department (HOSPITAL_COMMUNITY): Payer: Self-pay

## 2014-08-26 ENCOUNTER — Emergency Department (HOSPITAL_COMMUNITY): Payer: MEDICAID

## 2014-08-26 ENCOUNTER — Inpatient Hospital Stay (HOSPITAL_COMMUNITY)
Admission: EM | Admit: 2014-08-26 | Discharge: 2014-08-31 | DRG: 637 | Disposition: A | Discharge status: Home / Self Care | Payer: Self-pay | Attending: Internal Medicine | Admitting: Internal Medicine

## 2014-08-26 DIAGNOSIS — E785 Hyperlipidemia, unspecified: Secondary | ICD-10-CM | POA: Insufficient documentation

## 2014-08-26 DIAGNOSIS — R200 Anesthesia of skin: Secondary | ICD-10-CM

## 2014-08-26 DIAGNOSIS — I2510 Atherosclerotic heart disease of native coronary artery without angina pectoris: Secondary | ICD-10-CM | POA: Diagnosis present

## 2014-08-26 DIAGNOSIS — E0810 Diabetes mellitus due to underlying condition with ketoacidosis without coma: Secondary | ICD-10-CM | POA: Insufficient documentation

## 2014-08-26 DIAGNOSIS — Z7902 Long term (current) use of antithrombotics/antiplatelets: Secondary | ICD-10-CM

## 2014-08-26 DIAGNOSIS — Z9114 Patient's other noncompliance with medication regimen: Secondary | ICD-10-CM | POA: Diagnosis present

## 2014-08-26 DIAGNOSIS — Z96641 Presence of right artificial hip joint: Secondary | ICD-10-CM | POA: Diagnosis present

## 2014-08-26 DIAGNOSIS — J189 Pneumonia, unspecified organism: Secondary | ICD-10-CM | POA: Insufficient documentation

## 2014-08-26 DIAGNOSIS — I471 Supraventricular tachycardia, unspecified: Secondary | ICD-10-CM | POA: Insufficient documentation

## 2014-08-26 DIAGNOSIS — E1310 Other specified diabetes mellitus with ketoacidosis without coma: Principal | ICD-10-CM | POA: Diagnosis present

## 2014-08-26 DIAGNOSIS — R509 Fever, unspecified: Secondary | ICD-10-CM | POA: Insufficient documentation

## 2014-08-26 DIAGNOSIS — F17210 Nicotine dependence, cigarettes, uncomplicated: Secondary | ICD-10-CM | POA: Diagnosis present

## 2014-08-26 DIAGNOSIS — E870 Hyperosmolality and hypernatremia: Secondary | ICD-10-CM | POA: Diagnosis present

## 2014-08-26 DIAGNOSIS — Y95 Nosocomial condition: Secondary | ICD-10-CM | POA: Diagnosis not present

## 2014-08-26 DIAGNOSIS — Z6832 Body mass index (BMI) 32.0-32.9, adult: Secondary | ICD-10-CM

## 2014-08-26 DIAGNOSIS — E1165 Type 2 diabetes mellitus with hyperglycemia: Secondary | ICD-10-CM | POA: Diagnosis present

## 2014-08-26 DIAGNOSIS — I6529 Occlusion and stenosis of unspecified carotid artery: Secondary | ICD-10-CM | POA: Diagnosis present

## 2014-08-26 DIAGNOSIS — E1101 Type 2 diabetes mellitus with hyperosmolarity with coma: Secondary | ICD-10-CM | POA: Insufficient documentation

## 2014-08-26 DIAGNOSIS — Z91148 Patient's other noncompliance with medication regimen for other reason: Secondary | ICD-10-CM | POA: Insufficient documentation

## 2014-08-26 DIAGNOSIS — Z955 Presence of coronary angioplasty implant and graft: Secondary | ICD-10-CM

## 2014-08-26 DIAGNOSIS — R079 Chest pain, unspecified: Secondary | ICD-10-CM | POA: Diagnosis present

## 2014-08-26 DIAGNOSIS — R202 Paresthesia of skin: Secondary | ICD-10-CM | POA: Diagnosis present

## 2014-08-26 DIAGNOSIS — D72829 Elevated white blood cell count, unspecified: Secondary | ICD-10-CM | POA: Insufficient documentation

## 2014-08-26 DIAGNOSIS — E669 Obesity, unspecified: Secondary | ICD-10-CM | POA: Diagnosis present

## 2014-08-26 DIAGNOSIS — I252 Old myocardial infarction: Secondary | ICD-10-CM

## 2014-08-26 DIAGNOSIS — I25119 Atherosclerotic heart disease of native coronary artery with unspecified angina pectoris: Secondary | ICD-10-CM | POA: Insufficient documentation

## 2014-08-26 DIAGNOSIS — E1110 Type 2 diabetes mellitus with ketoacidosis without coma: Secondary | ICD-10-CM | POA: Diagnosis present

## 2014-08-26 DIAGNOSIS — IMO0001 Reserved for inherently not codable concepts without codable children: Secondary | ICD-10-CM | POA: Insufficient documentation

## 2014-08-26 DIAGNOSIS — I10 Essential (primary) hypertension: Secondary | ICD-10-CM | POA: Diagnosis present

## 2014-08-26 DIAGNOSIS — A419 Sepsis, unspecified organism: Secondary | ICD-10-CM | POA: Diagnosis not present

## 2014-08-26 DIAGNOSIS — Z7982 Long term (current) use of aspirin: Secondary | ICD-10-CM

## 2014-08-26 HISTORY — DX: Acute myocardial infarction, unspecified: I21.9

## 2014-08-26 LAB — URINE MICROSCOPIC-ADD ON

## 2014-08-26 LAB — CBC
HCT: 42.8 % (ref 39.0–52.0)
Hemoglobin: 15.6 g/dL (ref 13.0–17.0)
MCH: 33.5 pg (ref 26.0–34.0)
MCHC: 36.4 g/dL — ABNORMAL HIGH (ref 30.0–36.0)
MCV: 92 fL (ref 78.0–100.0)
Platelets: 188 10*3/uL (ref 150–400)
RBC: 4.65 MIL/uL (ref 4.22–5.81)
RDW: 13.1 % (ref 11.5–15.5)
WBC: 12.9 10*3/uL — ABNORMAL HIGH (ref 4.0–10.5)

## 2014-08-26 LAB — GLUCOSE, CAPILLARY
GLUCOSE-CAPILLARY: 261 mg/dL — AB (ref 70–99)
GLUCOSE-CAPILLARY: 186 mg/dL — AB (ref 70–99)
GLUCOSE-CAPILLARY: 156 mg/dL — AB (ref 70–99)
Glucose-Capillary: 338 mg/dL — ABNORMAL HIGH (ref 70–99)
Glucose-Capillary: 277 mg/dL — ABNORMAL HIGH (ref 70–99)
Glucose-Capillary: 275 mg/dL — ABNORMAL HIGH (ref 70–99)
Glucose-Capillary: 209 mg/dL — ABNORMAL HIGH (ref 70–99)
Glucose-Capillary: 199 mg/dL — ABNORMAL HIGH (ref 70–99)
Glucose-Capillary: 243 mg/dL — ABNORMAL HIGH (ref 70–99)
Glucose-Capillary: 96 mg/dL (ref 70–99)
Glucose-Capillary: 99 mg/dL (ref 70–99)
Glucose-Capillary: 209 mg/dL — ABNORMAL HIGH (ref 70–99)
Glucose-Capillary: 161 mg/dL — ABNORMAL HIGH (ref 70–99)

## 2014-08-26 LAB — BASIC METABOLIC PANEL
ANION GAP: 19 — AB (ref 5–15)
Anion gap: 16 — ABNORMAL HIGH (ref 5–15)
Anion gap: 15 (ref 5–15)
Anion gap: 16 — ABNORMAL HIGH (ref 5–15)
BUN: 14 mg/dL (ref 6–23)
BUN: 12 mg/dL (ref 6–23)
BUN: 12 mg/dL (ref 6–23)
BUN: 12 mg/dL (ref 6–23)
CALCIUM: 9.5 mg/dL (ref 8.4–10.5)
CALCIUM: 9.2 mg/dL (ref 8.4–10.5)
CALCIUM: 9.5 mg/dL (ref 8.4–10.5)
CALCIUM: 9.2 mg/dL (ref 8.4–10.5)
CO2: 17 meq/L — AB (ref 19–32)
CO2: 18 meq/L — AB (ref 19–32)
CO2: 19 meq/L (ref 19–32)
CO2: 19 meq/L (ref 19–32)
CREATININE: 0.6 mg/dL (ref 0.50–1.35)
Chloride: 93 mEq/L — ABNORMAL LOW (ref 96–112)
Chloride: 98 mEq/L (ref 96–112)
Chloride: 101 mEq/L (ref 96–112)
Chloride: 101 mEq/L (ref 96–112)
Creatinine, Ser: 0.66 mg/dL (ref 0.50–1.35)
Creatinine, Ser: 0.54 mg/dL (ref 0.50–1.35)
Creatinine, Ser: 0.54 mg/dL (ref 0.50–1.35)
GFR calc Af Amer: >90 mL/min (ref >90)
GFR calc Af Amer: >90 mL/min (ref >90)
GFR calc Af Amer: >90 mL/min (ref >90)
GFR calc Af Amer: >90 mL/min (ref >90)
GFR calc non Af Amer: >90 mL/min (ref >90)
GFR calc non Af Amer: >90 mL/min (ref >90)
GFR calc non Af Amer: >90 mL/min (ref >90)
GLUCOSE: 179 mg/dL — AB (ref 70–99)
GLUCOSE: 197 mg/dL — AB (ref 70–99)
Glucose, Bld: 411 mg/dL — ABNORMAL HIGH (ref 70–99)
Glucose, Bld: 248 mg/dL — ABNORMAL HIGH (ref 70–99)
Potassium: 4 mEq/L (ref 3.7–5.3)
Potassium: 3.8 mEq/L (ref 3.7–5.3)
Potassium: 3.6 mEq/L — ABNORMAL LOW (ref 3.7–5.3)
Potassium: 3.6 mEq/L — ABNORMAL LOW (ref 3.7–5.3)
Sodium: 129 mEq/L — ABNORMAL LOW (ref 137–147)
Sodium: 132 mEq/L — ABNORMAL LOW (ref 137–147)
Sodium: 135 mEq/L — ABNORMAL LOW (ref 137–147)
Sodium: 136 mEq/L — ABNORMAL LOW (ref 137–147)

## 2014-08-26 LAB — I-STAT VENOUS BLOOD GAS, ED
Acid-base deficit: 1 mmol/L (ref 0.0–2.0)
BICARBONATE: 24.3 meq/L — AB (ref 20.0–24.0)
O2 SAT: 87 %
TCO2: 25 mmol/L (ref 0–100)
pCO2, Ven: 40.2 mmHg — ABNORMAL LOW (ref 45.0–50.0)
pH, Ven: 7.389 — ABNORMAL HIGH (ref 7.250–7.300)
pO2, Ven: 53 mmHg — ABNORMAL HIGH (ref 30.0–45.0)

## 2014-08-26 LAB — MRSA PCR SCREENING: MRSA by PCR: NEGATIVE

## 2014-08-26 LAB — I-STAT TROPONIN, ED: Troponin i, poc: 0 ng/mL (ref 0.00–0.08)

## 2014-08-26 LAB — TROPONIN I
Troponin I: <0.3 ng/mL (ref <0.30)
Troponin I: <0.3 ng/mL (ref <0.30)

## 2014-08-26 LAB — I-STAT CHEM 8, ED
BUN: 20 mg/dL (ref 6–23)
CHLORIDE: 104 meq/L (ref 96–112)
CREATININE: 0.8 mg/dL (ref 0.50–1.35)
Calcium, Ion: 1.19 mmol/L (ref 1.12–1.23)
Glucose, Bld: 424 mg/dL — ABNORMAL HIGH (ref 70–99)
HCT: 47 % (ref 39.0–52.0)
Hemoglobin: 16 g/dL (ref 13.0–17.0)
POTASSIUM: 3.9 meq/L (ref 3.7–5.3)
Sodium: 133 mEq/L — ABNORMAL LOW (ref 137–147)
TCO2: 21 mmol/L (ref 0–100)

## 2014-08-26 LAB — CBG MONITORING, ED
Glucose-Capillary: 422 mg/dL — ABNORMAL HIGH (ref 70–99)
Glucose-Capillary: 406 mg/dL — ABNORMAL HIGH (ref 70–99)
Glucose-Capillary: 341 mg/dL — ABNORMAL HIGH (ref 70–99)

## 2014-08-26 LAB — URINALYSIS, ROUTINE W REFLEX MICROSCOPIC
BILIRUBIN URINE: NEGATIVE
Glucose, UA: >1000 mg/dL — AB
Ketones, ur: NEGATIVE mg/dL
Leukocytes, UA: NEGATIVE
Nitrite: NEGATIVE
PROTEIN: NEGATIVE mg/dL
Specific Gravity, Urine: 1.02 (ref 1.005–1.030)
UROBILINOGEN UA: 1 mg/dL (ref 0.0–1.0)
pH: 5.5 (ref 5.0–8.0)

## 2014-08-26 LAB — I-STAT CG4 LACTIC ACID, ED: Lactic Acid, Venous: 1.12 mmol/L (ref 0.5–2.2)

## 2014-08-26 MED ORDER — LORAZEPAM 2 MG/ML IJ SOLN
INTRAMUSCULAR | Status: AC
  Filled 2014-08-26: qty 1

## 2014-08-26 MED ORDER — INSULIN ASPART PROT & ASPART (70-30 MIX) 100 UNIT/ML ~~LOC~~ SUSP
24.0000 [IU] | Freq: Two times a day (BID) | SUBCUTANEOUS | Status: DC
  Administered 2014-08-26 – 2014-08-28 (×4): 24 [IU] via SUBCUTANEOUS
  Filled 2014-08-26: qty 10

## 2014-08-26 MED ORDER — INSULIN ASPART 100 UNIT/ML ~~LOC~~ SOLN
0.0000 [IU] | Freq: Three times a day (TID) | SUBCUTANEOUS | Status: DC
  Administered 2014-08-27: 11 [IU] via SUBCUTANEOUS

## 2014-08-26 MED ORDER — ASPIRIN 81 MG PO CHEW
324.0000 mg | CHEWABLE_TABLET | Freq: Once | ORAL | Status: AC
  Administered 2014-08-26: 324 mg via ORAL
  Filled 2014-08-26: qty 4

## 2014-08-26 MED ORDER — DEXTROSE-NACL 5-0.45 % IV SOLN
INTRAVENOUS | Status: AC
  Administered 2014-08-26: 14:00:00 via INTRAVENOUS

## 2014-08-26 MED ORDER — ACETAMINOPHEN 325 MG PO TABS
650.0000 mg | ORAL_TABLET | Freq: Four times a day (QID) | ORAL | Status: DC | PRN
  Administered 2014-08-27 – 2014-08-28 (×2): 650 mg via ORAL
  Filled 2014-08-26 (×2): qty 2

## 2014-08-26 MED ORDER — LISINOPRIL 40 MG PO TABS
40.0000 mg | ORAL_TABLET | Freq: Two times a day (BID) | ORAL | Status: DC
  Administered 2014-08-26 – 2014-08-31 (×10): 40 mg via ORAL
  Filled 2014-08-26 (×13): qty 1

## 2014-08-26 MED ORDER — CLOPIDOGREL BISULFATE 75 MG PO TABS
75.0000 mg | ORAL_TABLET | Freq: Every day | ORAL | Status: DC
  Administered 2014-08-26 – 2014-08-31 (×6): 75 mg via ORAL
  Filled 2014-08-26 (×8): qty 1

## 2014-08-26 MED ORDER — HEPARIN SODIUM (PORCINE) 5000 UNIT/ML IJ SOLN
5000.0000 [IU] | Freq: Three times a day (TID) | INTRAMUSCULAR | Status: DC
  Administered 2014-08-26 – 2014-08-31 (×14): 5000 [IU] via SUBCUTANEOUS
  Filled 2014-08-26 (×18): qty 1

## 2014-08-26 MED ORDER — NICOTINE 14 MG/24HR TD PT24
14.0000 mg | MEDICATED_PATCH | Freq: Every day | TRANSDERMAL | Status: DC
  Administered 2014-08-26 – 2014-08-31 (×6): 14 mg via TRANSDERMAL
  Filled 2014-08-26 (×6): qty 1

## 2014-08-26 MED ORDER — LORAZEPAM 2 MG/ML IJ SOLN: 0.5000 mg | Freq: Once | INTRAMUSCULAR | Status: DC

## 2014-08-26 MED ORDER — SODIUM CHLORIDE 0.9 % IV SOLN
INTRAVENOUS | Status: DC
  Administered 2014-08-26 – 2014-08-29 (×3): via INTRAVENOUS

## 2014-08-26 MED ORDER — ASPIRIN EC 81 MG PO TBEC
81.0000 mg | DELAYED_RELEASE_TABLET | Freq: Every day | ORAL | Status: DC
  Administered 2014-08-26 – 2014-08-31 (×6): 81 mg via ORAL
  Filled 2014-08-26 (×6): qty 1

## 2014-08-26 MED ORDER — NITROGLYCERIN 0.4 MG SL SUBL: 0.4000 mg | SUBLINGUAL_TABLET | SUBLINGUAL | Status: DC | PRN

## 2014-08-26 MED ORDER — DEXTROSE-NACL 5-0.45 % IV SOLN: INTRAVENOUS | Status: DC

## 2014-08-26 MED ORDER — POTASSIUM CHLORIDE 10 MEQ/100ML IV SOLN
10.0000 meq | INTRAVENOUS | Status: AC
  Administered 2014-08-26 (×2): 10 meq via INTRAVENOUS
  Filled 2014-08-26 (×2): qty 100

## 2014-08-26 MED ORDER — LIVING WELL WITH DIABETES BOOK
Freq: Once | Status: AC
  Administered 2014-08-26: 14:00:00
  Filled 2014-08-26: qty 1

## 2014-08-26 MED ORDER — ASPIRIN 81 MG PO CHEW
243.0000 mg | CHEWABLE_TABLET | Freq: Once | ORAL | Status: AC
  Administered 2014-08-26: 243 mg via ORAL
  Filled 2014-08-26: qty 3

## 2014-08-26 MED ORDER — INSULIN ASPART 100 UNIT/ML ~~LOC~~ SOLN: 0.0000 [IU] | Freq: Every day | SUBCUTANEOUS | Status: DC

## 2014-08-26 MED ORDER — SODIUM CHLORIDE 0.9 % IV SOLN
INTRAVENOUS | Status: AC
  Administered 2014-08-26: 09:00:00 via INTRAVENOUS
  Filled 2014-08-26: qty 2.5

## 2014-08-26 MED ORDER — SODIUM CHLORIDE 0.9 % IV SOLN
INTRAVENOUS | Status: DC
  Administered 2014-08-26: 3.6 [IU]/h via INTRAVENOUS
  Filled 2014-08-26: qty 2.5

## 2014-08-26 MED ORDER — OXYCODONE HCL 5 MG PO TABS
5.0000 mg | ORAL_TABLET | Freq: Four times a day (QID) | ORAL | Status: DC | PRN
  Administered 2014-08-26 (×2): 5 mg via ORAL
  Filled 2014-08-26 (×2): qty 1

## 2014-08-26 MED ORDER — ONDANSETRON HCL 4 MG/2ML IJ SOLN: 4.0000 mg | Freq: Four times a day (QID) | INTRAMUSCULAR | Status: DC | PRN

## 2014-08-26 NOTE — Progress Notes (Signed)
Utilization review completed.  

## 2014-08-26 NOTE — Progress Notes (Signed)
VASCULAR LAB PRELIMINARY  PRELIMINARY  PRELIMINARY  PRELIMINARY  Carotid Dopplers completed.    Preliminary report:  1-39% ICA stenosis.  Vertebral artery flow is antegrade.   Howard Mcmahon, RVT 08/26/2014, 2:42 PM

## 2014-08-26 NOTE — Progress Notes (Signed)
Patient unable to remain still during MRI contacted MD and received an order for Ativan.  Patient refuses ativan and states "I am done with this."  Explained to patient that the scans were unclear and the risks associated with not completing the scans pt. verbalized understanding but continued to refuse.  Ativan 2mg  IV wasted with Katina Degree RN.

## 2014-08-26 NOTE — ED Provider Notes (Signed)
CSN: [id number]     Arrival date & time 08/26/14  [id number] History   First MD Initiated Contact with Patient 08/26/14 0302     Chief Complaint  Patient presents with  . Chest Pain     (Consider location/radiation/quality/duration/timing/severity/associated sxs/prior Treatment) HPI Comments: Pt comes in w/ cc of chest pain. PT has hx of CAD, s/p stent placement at [facility]. Pt also has hx of DM. Reports that he has been having off and on chest pain for several days now. He started having L sided numbness 2 days ago. Numbness is described as tingling, and it is constant. Pt has no specific aggravating or relieving factors for the chest pain, and there is no precipitating factor either. Pain feels like heaviness.   Patient is a 43 y.o. male presenting with chest pain. The history is provided by the patient and medical records.  Chest Pain Associated symptoms: numbness and shortness of breath   Associated symptoms: no abdominal pain and no cough     Past Medical History  Diagnosis Date  . Diabetes mellitus without complication   . Hypertension   . Macular infarction     2012  . MI (myocardial infarction)    Past Surgical History  Procedure Laterality Date  . Coronary angioplasty with stent placement    . Hip arthroplasty Right    Family History  Problem Relation Age of Onset  . Stroke Mother    History  Substance Use Topics  . Smoking status: Current Every Day Smoker -- 2.00 packs/day for 30 years    Types: Cigarettes  . Smokeless tobacco: Not on file  . Alcohol Use: No    Review of Systems  Constitutional: Positive for activity change. Negative for appetite change.  Respiratory: Positive for shortness of breath. Negative for cough.   Cardiovascular: Positive for chest pain.  Gastrointestinal: Negative for abdominal pain.  Genitourinary: Negative for dysuria.  Neurological: Positive for numbness.  All other systems reviewed and are negative.     Allergies  Review  of patient's allergies indicates no known allergies.  Home Medications   Prior to Admission medications   Medication Sig Start Date End Date Taking? Authorizing Provider  aspirin EC 81 MG tablet Take 81 mg by mouth daily.   Yes Historical Provider, MD  clopidogrel (PLAVIX) 75 MG tablet Take 75 mg by mouth daily with breakfast.   Yes Historical Provider, MD  insulin NPH-regular Human (NOVOLIN 70/30) (70-30) 100 UNIT/ML injection Inject 30 Units into the skin 2 (two) times daily with a meal.    Yes Historical Provider, MD  lisinopril (PRINIVIL,ZESTRIL) 20 MG tablet Take 40 mg by mouth 2 (two) times daily.    Yes Historical Provider, MD  ciprofloxacin (CIPRO) 500 MG tablet Take 1 tablet (500 mg total) by mouth 2 (two) times daily. 01/12/14   Theodis Blaze, MD  oxyCODONE (OXY IR/ROXICODONE) 5 MG immediate release tablet Take 1 tablet (5 mg total) by mouth every 4 (four) hours as needed for moderate pain. 01/12/14   Theodis Blaze, MD  senna-docusate (SENOKOT-S) 8.6-50 MG per tablet Take 1 tablet by mouth 2 (two) times daily. 01/12/14   Theodis Blaze, MD   BP 121/83 mmHg  Pulse 78  Temp(Src) 98 F (36.7 C) (Oral)  Resp 12  Ht 6\' 4"  (1.93 m)  Wt 280 lb (127.007 kg)  BMI 34.10 kg/m2  SpO2 95% Physical Exam  Constitutional: He is oriented to person, place, and time. He appears well-developed.  HENT:  Head: Normocephalic and atraumatic.  Eyes: Conjunctivae and EOM are normal. Pupils are equal, round, and reactive to light.  Neck: Normal range of motion. Neck supple.  Cardiovascular: Normal rate and regular rhythm.   Pulmonary/Chest: Effort normal and breath sounds normal.  Abdominal: Soft. Bowel sounds are normal. He exhibits no distension. There is no tenderness. There is no rebound and no guarding.  Musculoskeletal: He exhibits no edema.  Neurological: He is alert and oriented to person, place, and time. No cranial nerve deficit. Coordination normal.  Cerebellar exam is normal (finger to  nose) Sensory exam normal for bilateral upper and lower extremities - and patient is able to discriminate between sharp and dull. Motor exam is 4+/5   Skin: Skin is warm.  Nursing note and vitals reviewed.   ED Course  Procedures (including critical care time) Labs Review Labs Reviewed  CBC - Abnormal; Notable for the following:    WBC 12.9 (*)    MCHC 36.4 (*)    All other components within normal limits  BASIC METABOLIC PANEL - Abnormal; Notable for the following:    Sodium 129 (*)    Chloride 93 (*)    CO2 17 (*)    Glucose, Bld 411 (*)    Anion gap 19 (*)    All other components within normal limits  URINALYSIS, ROUTINE W REFLEX MICROSCOPIC - Abnormal; Notable for the following:    Glucose, UA >1000 (*)    Hgb urine dipstick TRACE (*)    All other components within normal limits  I-STAT VENOUS BLOOD GAS, ED - Abnormal; Notable for the following:    pH, Ven 7.389 (*)    pCO2, Ven 40.2 (*)    pO2, Ven 53.0 (*)    Bicarbonate 24.3 (*)    All other components within normal limits  CBG MONITORING, ED - Abnormal; Notable for the following:    Glucose-Capillary 422 (*)    All other components within normal limits  CBG MONITORING, ED - Abnormal; Notable for the following:    Glucose-Capillary 406 (*)    All other components within normal limits  I-STAT CHEM 8, ED - Abnormal; Notable for the following:    Sodium 133 (*)    Glucose, Bld 424 (*)    All other components within normal limits  URINE MICROSCOPIC-ADD ON  I-STAT TROPOININ, ED  I-STAT CG4 LACTIC ACID, ED    Imaging Review Ct Head Wo Contrast  08/26/2014   CLINICAL DATA:  Left arm and hand tingling for 2 days  EXAM: CT HEAD WITHOUT CONTRAST  TECHNIQUE: Contiguous axial images were obtained from the base of the skull through the vertex without intravenous contrast.  COMPARISON:  01/09/2014  FINDINGS: Skull and Sinuses:No acute fracture destructive process. There is no sinus effusion. Mucosal thickening in the left  nasal cavity with rightward deviation of the nasal septum, likely with remote septal fracture. There is chronic thickening of the adenoid tonsil.  Orbits: No acute abnormality.  Brain: No evidence of acute infarction, hemorrhage, hydrocephalus, or mass lesion/mass effect. There is mild but premature small vessel disease with ischemic gliosis around the lateral ventricles and the discrete low-attenuation in the subcortical left frontal white matter. These changes are better seen on preceding brain MRI. A choroid fissure cyst is incidentally noted on the left.  IMPRESSION: 1. No acute intracranial findings. 2. Mild but premature small vessel disease.   Electronically Signed   By: Jorje Guild M.D.   On: 08/26/2014 05:42  Dg Chest Port 1 View  08/26/2014   CLINICAL DATA:  Left-sided chest pain  EXAM: PORTABLE CHEST - 1 VIEW  COMPARISON:  01/09/2014  FINDINGS: Chronic bronchitic markings. There is no edema, consolidation, effusion, or pneumothorax. Normal heart size. Stable aortic and hilar contours.  IMPRESSION: Chronic bronchitic changes.  No acute cardiopulmonary disease.   Electronically Signed   By: Jorje Guild M.D.   On: 08/26/2014 03:36     EKG Interpretation   Date/Time:  Thursday August 26 2014 02:56:59 EST Ventricular Rate:  110 PR Interval:  170 QRS Duration: 70 QT Interval:  305 QTC Calculation: 412 R Axis:   17 Text Interpretation:  Sinus tachycardia Probable left atrial enlargement  Low voltage, precordial leads Borderline ST elevation, lateral leads  Confirmed by Kathrynn Humble, MD, Arrayah Connors [phone]) on 08/26/2014 4:23:55 AM       EKG Interpretation  Date/Time:  Thursday August 26 2014 02:56:59 EST Ventricular Rate:  110 PR Interval:  170 QRS Duration: 70 QT Interval:  305 QTC Calculation: 412 R Axis:   17 Text Interpretation:  Sinus tachycardia Probable left atrial enlargement Low voltage, precordial leads Borderline ST elevation, lateral leads Confirmed by Kathrynn Humble, MD,  Thelma Comp [phone]) on 08/26/2014 4:23:55 AM       MDM   Final diagnoses:  Diabetic ketoacidosis without coma associated with diabetes mellitus due to underlying condition  Left sided numbness  Chest pain, unspecified chest pain type   CRITICAL CARE Performed by: Varney Biles   Total critical care time: 40 min  Critical care time was exclusive of separately billable procedures and treating other patients.  Critical care was necessary to treat or prevent imminent or life-threatening deterioration.  Critical care was time spent personally by me on the following activities: development of treatment plan with patient and/or surrogate as well as nursing, discussions with consultants, evaluation of patient's response to treatment, examination of patient, obtaining history from patient or surrogate, ordering and performing treatments and interventions, ordering and review of laboratory studies, ordering and review of radiographic studies, pulse oximetry and re-evaluation of patient's condition.   Pt comes in with cc of chest pain, tingling. Hx of CAD. EKG shows no acute findings - except for some non specific ST changes. Concerns for pain being ischemic type chest pains. Will need further workup/rule out.  Pt also has L sided tingling. CT head is neg. MRI ordered. Could be related to chest pain as well. NEUROLOGY NOT consulted. If MRI is +, admitting team can get neuro involved.  Also, pt in DKA, mild, Will start the meds for that.   Varney Biles, MD 08/26/14 [phone]

## 2014-08-26 NOTE — ED Notes (Signed)
MD at bedside. 

## 2014-08-26 NOTE — Progress Notes (Signed)
  Echocardiogram 2D Echocardiogram has been performed.  Howard Mcmahon 08/26/2014, 12:10 PM

## 2014-08-26 NOTE — H&P (Signed)
[facility] History and Physical  Howard Mcmahon EXB:[id number] DOB: [birth year] DOA: 08/26/2014   PCP: Lorayne Marek, MD  Specialists: None  Chief Complaint: Tingling in the left hand for 2 days  HPI: Howard Mcmahon is a 43 y.o. male with a past medical history of diabetes, hypertension, hyperlipidemia, coronary artery disease, status post stent placement in the past who is a very poor historian. He was sleepy and groggy throughout the time that I was with him. Based on the ED provider notes, it appears that he presented with complaints of chest pain ongoing for one week. And then he also mentioned tingling in the left hand. When I spoke to him he denied any chest pain. He said the tingling has resolved. He was found to have elevated blood sugars and was started on an insulin infusion. He denies any nausea, vomiting or abdominal pain. He denies noncompliance with his medications. Denies any leg swelling. No generalized or focal weakness. He's unable to provide me any information regarding his symptom of chest pain that he presented with. So, overall a very poor historian.  Home Medications: Prior to Admission medications   Medication Sig Start Date End Date Taking? Authorizing Provider  aspirin EC 81 MG tablet Take 81 mg by mouth daily.   Yes Historical Provider, MD  clopidogrel (PLAVIX) 75 MG tablet Take 75 mg by mouth daily with breakfast.   Yes Historical Provider, MD  insulin NPH-regular Human (NOVOLIN 70/30) (70-30) 100 UNIT/ML injection Inject 30 Units into the skin 2 (two) times daily with a meal.    Yes Historical Provider, MD  lisinopril (PRINIVIL,ZESTRIL) 20 MG tablet Take 40 mg by mouth 2 (two) times daily.    Yes Historical Provider, MD  ciprofloxacin (CIPRO) 500 MG tablet Take 1 tablet (500 mg total) by mouth 2 (two) times daily. 01/12/14   Theodis Blaze, MD  oxyCODONE (OXY IR/ROXICODONE) 5 MG immediate release tablet Take 1 tablet (5 mg total) by mouth every 4 (four) hours as  needed for moderate pain. 01/12/14   Theodis Blaze, MD  senna-docusate (SENOKOT-S) 8.6-50 MG per tablet Take 1 tablet by mouth 2 (two) times daily. 01/12/14   Theodis Blaze, MD    Allergies: No Known Allergies  Past Medical History: Past Medical History  Diagnosis Date  . Diabetes mellitus without complication   . Hypertension   . Macular infarction     2012  . MI (myocardial infarction)     Past Surgical History  Procedure Laterality Date  . Coronary angioplasty with stent placement    . Hip arthroplasty Right     Social History: He lives in [place]. He works as a Education officer, museum. He smokes a pack and a half of cigarettes on a daily basis. Denies any alcohol use. Denies any recreational drug use.  Family History:  Family History  Problem Relation Age of Onset  . Stroke Mother      Review of Systems - History obtained from the patient General ROS: negative Psychological ROS: negative Ophthalmic ROS: negative ENT ROS: negative Allergy and Immunology ROS: negative Hematological and Lymphatic ROS: negative Endocrine ROS: negative Respiratory ROS: no cough, shortness of breath, or wheezing Cardiovascular ROS: as in hpi Gastrointestinal ROS: no abdominal pain, change in bowel habits, or black or bloody stools Genito-Urinary ROS: no dysuria, trouble voiding, or hematuria Musculoskeletal ROS: negative Neurological ROS: as in hpi Dermatological ROS: negative  Physical Examination  Filed Vitals:   08/26/14 0629 08/26/14 0645 08/26/14 0700 08/26/14 0715  BP: 118/79 144/88 125/86 121/83  Pulse: 81 85 82 78  Temp:      TempSrc:      Resp: 12     Height:      Weight:      SpO2: 97% 100%  95%    BP 121/83 mmHg  Pulse 78  Temp(Src) 98 F (36.7 C) (Oral)  Resp 12  Ht _0  (1.93 m)  Wt 127.007 kg (280 lb)  BMI 34.10 kg/m2  SpO2 95%  General appearance: appears stated age, distracted, no distress and moderately obese. Sleepy but easily arousable. Head:  Normocephalic, without obvious abnormality, atraumatic Eyes: conjunctivae/corneas clear. PERRL, EOM's intact.  Throat: lips, mucosa, and tongue normal; teeth and gums normal Neck: no adenopathy, no carotid bruit, no JVD, supple, symmetrical, trachea midline, thyroid not enlarged, symmetric, no tenderness/mass/nodules and Good range of motion Resp: clear to auscultation bilaterally Cardio: regular rate and rhythm, S1, S2 normal, no murmur, click, rub or gallop GI: soft, non-tender; bowel sounds normal; no masses,  no organomegaly Extremities: extremities normal, atraumatic, no cyanosis or edema Pulses: 2+ and symmetric Skin: Skin color, texture, turgor normal. No rashes or lesions Lymph nodes: Cervical, supraclavicular, and axillary nodes normal. Neurologic: Alert and oriented 3. No obvious facial asymmetry. Tongue is midline. Other cranial nerves intact 2-12. Motor strength 5-5 bilateral upper and lower extremities. Gait not assessed.  Laboratory Data: Results for orders placed or performed during the hospital encounter of 08/26/14 (from the past 48 hour(s))  CBC     Status: Abnormal   Collection Time: 08/26/14  2:50 AM  Result Value Ref Range   WBC 12.9 (H) 4.0 - 10.5 K/uL   RBC 4.65 4.22 - 5.81 MIL/uL   Hemoglobin 15.6 13.0 - 17.0 g/dL   HCT 42.8 39.0 - 52.0 %   MCV 92.0 78.0 - 100.0 fL   MCH 33.5 26.0 - 34.0 pg   MCHC 36.4 (H) 30.0 - 36.0 g/dL   RDW 13.1 11.5 - 15.5 %   Platelets 188 150 - 400 K/uL  Basic metabolic panel     Status: Abnormal   Collection Time: 08/26/14  2:50 AM  Result Value Ref Range   Sodium 129 (L) 137 - 147 mEq/L   Potassium 4.0 3.7 - 5.3 mEq/L    Comment: HEMOLYSIS AT THIS LEVEL MAY AFFECT RESULT   Chloride 93 (L) 96 - 112 mEq/L   CO2 17 (L) 19 - 32 mEq/L   Glucose, Bld 411 (H) 70 - 99 mg/dL   BUN 14 6 - 23 mg/dL   Creatinine, Ser 0.66 0.50 - 1.35 mg/dL   Calcium 9.5 8.4 - 10.5 mg/dL   GFR calc non Af Amer >90 >90 mL/min   GFR calc Af Amer >90 >90  mL/min    Comment: (NOTE) The eGFR has been calculated using the CKD EPI equation. This calculation has not been validated in all clinical situations. eGFR's persistently <90 mL/min signify possible Chronic Kidney Disease.    Anion gap 19 (H) 5 - 15  I-stat troponin, ED (not at [facility])     Status: None   Collection Time: 08/26/14  2:56 AM  Result Value Ref Range   Troponin i, poc 0.00 0.00 - 0.08 ng/mL   Comment 3            Comment: Due to the release kinetics of cTnI, a negative result within the first hours of the onset of symptoms does not rule out myocardial infarction with certainty. If myocardial  infarction is still suspected, repeat the test at appropriate intervals.   I-Stat CG4 Lactic Acid, ED     Status: None   Collection Time: 08/26/14  4:44 AM  Result Value Ref Range   Lactic Acid, Venous 1.12 0.5 - 2.2 mmol/L  I-Stat venous blood gas, ED     Status: Abnormal   Collection Time: 08/26/14  4:45 AM  Result Value Ref Range   pH, Ven 7.389 (H) 7.250 - 7.300   pCO2, Ven 40.2 (L) 45.0 - 50.0 mmHg   pO2, Ven 53.0 (H) 30.0 - 45.0 mmHg   Bicarbonate 24.3 (H) 20.0 - 24.0 mEq/L   TCO2 25 0 - 100 mmol/L   O2 Saturation 87.0 %   Acid-base deficit 1.0 0.0 - 2.0 mmol/L   Sample type VENOUS   Urinalysis, Routine w reflex microscopic     Status: Abnormal   Collection Time: 08/26/14  4:55 AM  Result Value Ref Range   Color, Urine YELLOW YELLOW   APPearance CLEAR CLEAR   Specific Gravity, Urine 1.020 1.005 - 1.030   pH 5.5 5.0 - 8.0   Glucose, UA >1000 (A) NEGATIVE mg/dL   Hgb urine dipstick TRACE (A) NEGATIVE   Bilirubin Urine NEGATIVE NEGATIVE   Ketones, ur NEGATIVE NEGATIVE mg/dL   Protein, ur NEGATIVE NEGATIVE mg/dL   Urobilinogen, UA 1.0 0.0 - 1.0 mg/dL   Nitrite NEGATIVE NEGATIVE   Leukocytes, UA NEGATIVE NEGATIVE  Urine microscopic-add on     Status: None   Collection Time: 08/26/14  4:55 AM  Result Value Ref Range   Squamous Epithelial / LPF RARE RARE   WBC, UA 3-6  <3 WBC/hpf   RBC / HPF 0-2 <3 RBC/hpf   Bacteria, UA RARE RARE  CBG monitoring, ED     Status: Abnormal   Collection Time: 08/26/14  5:23 AM  Result Value Ref Range   Glucose-Capillary 422 (H) 70 - 99 mg/dL  POC CBG, ED     Status: Abnormal   Collection Time: 08/26/14  6:28 AM  Result Value Ref Range   Glucose-Capillary 406 (H) 70 - 99 mg/dL  I-stat chem 8, ed     Status: Abnormal   Collection Time: 08/26/14  6:35 AM  Result Value Ref Range   Sodium 133 (L) 137 - 147 mEq/L   Potassium 3.9 3.7 - 5.3 mEq/L   Chloride 104 96 - 112 mEq/L   BUN 20 6 - 23 mg/dL   Creatinine, Ser 0.80 0.50 - 1.35 mg/dL   Glucose, Bld 424 (H) 70 - 99 mg/dL   Calcium, Ion 1.19 1.12 - 1.23 mmol/L   TCO2 21 0 - 100 mmol/L   Hemoglobin 16.0 13.0 - 17.0 g/dL   HCT 47.0 39.0 - 52.0 %  CBG monitoring, ED     Status: Abnormal   Collection Time: 08/26/14  7:27 AM  Result Value Ref Range   Glucose-Capillary 341 (H) 70 - 99 mg/dL    Radiology Reports: Ct Head Wo Contrast  08/26/2014   CLINICAL DATA:  Left arm and hand tingling for 2 days  EXAM: CT HEAD WITHOUT CONTRAST  TECHNIQUE: Contiguous axial images were obtained from the base of the skull through the vertex without intravenous contrast.  COMPARISON:  01/09/2014  FINDINGS: Skull and Sinuses:No acute fracture destructive process. There is no sinus effusion. Mucosal thickening in the left nasal cavity with rightward deviation of the nasal septum, likely with remote septal fracture. There is chronic thickening of the adenoid tonsil.  Orbits:  No acute abnormality.  Brain: No evidence of acute infarction, hemorrhage, hydrocephalus, or mass lesion/mass effect. There is mild but premature small vessel disease with ischemic gliosis around the lateral ventricles and the discrete low-attenuation in the subcortical left frontal white matter. These changes are better seen on preceding brain MRI. A choroid fissure cyst is incidentally noted on the left.  IMPRESSION: 1. No  acute intracranial findings. 2. Mild but premature small vessel disease.   Electronically Signed   By: Jorje Guild M.D.   On: 08/26/2014 05:42   Dg Chest Port 1 View  08/26/2014   CLINICAL DATA:  Left-sided chest pain  EXAM: PORTABLE CHEST - 1 VIEW  COMPARISON:  01/09/2014  FINDINGS: Chronic bronchitic markings. There is no edema, consolidation, effusion, or pneumothorax. Normal heart size. Stable aortic and hilar contours.  IMPRESSION: Chronic bronchitic changes.  No acute cardiopulmonary disease.   Electronically Signed   By: Jorje Guild M.D.   On: 08/26/2014 03:36    Electrocardiogram: Sinus tachycardia at 110 bpm. Normal axis. Intervals are normal. No concerning Q waves. No concerning ST or T-wave changes. Similar to EKG from May.  Problem List  Principal Problem:   Hyperglycemia due to type 2 diabetes mellitus Active Problems:   DKA (diabetic ketoacidoses)   Essential hypertension, benign   CAD (coronary artery disease)   Chest pain   Paresthesia of left arm   Assessment: This is a 43 year old African-American male who presented with the unclear and nonspecific symptoms. He was apparently having chest pain when he presented to the ED. He also was having tingling in the left hand. It was the tingling that he was most concerned about. He was also found to be hyperglycemic. He has slightly low bicarbonate, but no ketones in the blood.  Plan: #1 chest pain: Etiology unclear. Symptoms appear to have resolved. Initial troponin is normal. We will continue to obtain serial troponin. Continue antiplatelet agents. Get an echocardiogram. He does have a history of coronary artery disease with a stent placed about 3 years ago according to the patient.  #2 Left hand tingling: Unclear etiology. MRI of the brain has been ordered. We'll get a carotid Doppler as well. He does not have any focal neurological deficits on examination. His unlikely to be a CNS issue. He does get some neck stiffness  that time. So, we will also get an MRI of the cervical spine. Symptoms, however, have resolved. PT/OT.  #3 Hyperglycemia in the setting of type 2 diabetes: This doesn't appear to be DKA. We will continue with insulin infusion. When his blood sugars are down to goal, we will transition him back to his insulin regimen. Check HbA1c. His last A1c in May was 14, implying very poor control.  #4 history of Coronary artery disease status post stent in the past: See #1. Continue with his home medications.  #5 history of hypertension: Continue with his home medications.   DVT Prophylaxis: Heparin Code Status: Full code Family Communication: Discussed with the patient  Disposition Plan: Admit to stepdown.   Further management decisions will depend on results of further testing and patient's response to treatment.   [facility]  [facility] Pager [phone]  If 7PM-7AM, please contact night-coverage [email or web address] Password [facility]  08/26/2014, 7:41 AM

## 2014-08-26 NOTE — Progress Notes (Addendum)
Inpatient Diabetes Program Recommendations  AACE/ADA: New Consensus Statement on Inpatient Glycemic Control (2013)  Target Ranges:  Prepandial:   less than 140 mg/dL      Peak postprandial:   less than 180 mg/dL (1-2 hours)      Critically ill patients:  140 - 180 mg/dL     Results for Howard Mcmahon, CHEATAM (MRN [id number]) as of 08/26/2014 10:52  Ref. Range 08/26/2014 05:23 08/26/2014 06:28 08/26/2014 07:27 08/26/2014 08:20  Glucose-Capillary Latest Range: 70-99 mg/dL 422 (H) 406 (H) 341 (H) 338 (H)     Admitted with CP and DKA.  Glucose 411 mg/dl on admission.  A1c has been ordered.   Home DM Meds: 70/30 insulin- 30 units bidwc   Current Insulin Orders: IV insulin drip per GlucoStabilizer    MD- Once patient ready to transition off IV insulin drip, please use Phase 2 DKA Order set  Please start at least 80% of patient's home dose of 70/30 insulin 1-2 hours prior to discontinuation of IV insulin drip    Addendum 1330pm: Spoke with patient about his DM care at home.  Patient told me he has a CBG meter but inconsistently checks his CBGs.  CBGs usually elevated >250 mg/dl when he checks.  Expect A1c to be elevated when the results are back.  Explained what an A1c is and what it measures.  Reminded patient that their goal A1c is 7% or less per ADA standards to prevent both acute and long-term complications.  Explained to patient the extreme importance of good glucose control at home.  Encouraged patient to check their CBGs at least bid at home (fasting and another check within the day) and to record all CBGs in a logbook for their PCP to review.  Also reviewed blood sugar goals with patient as well.  Also discussed with patient what DKA is and all the treatments we have been providing to him.  Explained to patient that he will need to remain on the IV insulin drip until all his labs are improved.  Discussed how DKA affects the body and also stressed to patient the extreme importance of getting  quick treatment for DKA as DKA can be fatal if left untreated.  Have placed RD consult for further diet instruction.  Have also placed care management consult to help assist patient in finding PCP after d/c.    Will follow [name] RN, MSN, CDE Diabetes Coordinator Inpatient Diabetes Program Team Pager: [phone] (8a-10p)

## 2014-08-26 NOTE — Care Management Note (Addendum)
    Page 1 of 2   08/31/2014     4:13:09 PM CARE MANAGEMENT NOTE 08/31/2014  Patient:  Howard Mcmahon, Howard Mcmahon   Account Number:  [id number]  Date Initiated:  08/26/2014  Documentation initiated by:  Marvetta Gibbons  Subjective/Objective Assessment:   Pt admitted with DKA     Action/Plan:   PTA pt lived at home   Anticipated DC Date:  08/31/2014   Anticipated DC Plan:  [place]  CM consult  Follow-up appt scheduled  Medication [facility]      Choice offered to / List presented to:             Status of service:  Completed, signed off Medicare Important Message given?  NO (If response is "NO", the following Medicare IM given date fields will be blank) Date Medicare IM given:   Medicare IM given by:   Date Additional Medicare IM given:   Additional Medicare IM given by:    Discharge Disposition:  HOME/SELF CARE  Per UR Regulation:  Reviewed for med. necessity/level of care/duration of stay  If discussed at [place] of Stay Meetings, dates discussed:    Comments:  08/31/14 [place], BSN [phone] patient dc today, patient has f/u apt with [facility] and if he wants he can follow up with [facility] in [place], not quite sure which one patient wants to follow up with since he wants one in [place] and then still go to [place].  Also Floor RN, Vaughan Basta,  states patient did not need help with medications.  08-27-14 [address] [place] [phone] CM did speak with pt in regards to PCP. 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 Per pt he would like to continue being seen at the [facility]. F/u appointment placed on the AVS. Per pt he can get insulin 70/30 for $7.0 @ the [facility]. Other meds cost him $4.00 at [facility]. No other needs from CM at this time.    08/26/14- [place] RN, BSN [phone] Referral for PCP needs in [place]- spoke with pt and mom at  bedside- per conversation pt lives in [place] but works in [place], [place] lives in [place]. Pt states that he goes to the [facility] in [place] for his PCP needs and medications he also uses [facility] for some meds. Pt would like to see a PCP in [place] since he works here- could f/u with [facility] - will arrange f/u at discharge- NCM to continue to follow.

## 2014-08-26 NOTE — ED Notes (Signed)
Pt from home with chest pain that has been present for one week.  Sts pain has radiated to left arm.  Pain in left arm started a couple of days ago but pt sts he "blew it off".  Pt reports diaphoresis tonight and shortness of breath.  Pt had a MI 1 week ago with 1 stent placement.  Pt takes plavix and took 1 81 mg of ASA at home PTA.

## 2014-08-26 NOTE — Progress Notes (Signed)
Paged Dr. Baltazar Najjar to see if we can change pts CBGs to ACHS now that he is off the insulin drip. Awaiting call back.

## 2014-08-27 DIAGNOSIS — D72829 Elevated white blood cell count, unspecified: Secondary | ICD-10-CM | POA: Insufficient documentation

## 2014-08-27 DIAGNOSIS — I10 Essential (primary) hypertension: Secondary | ICD-10-CM | POA: Insufficient documentation

## 2014-08-27 DIAGNOSIS — R079 Chest pain, unspecified: Secondary | ICD-10-CM | POA: Insufficient documentation

## 2014-08-27 DIAGNOSIS — E785 Hyperlipidemia, unspecified: Secondary | ICD-10-CM

## 2014-08-27 DIAGNOSIS — E1101 Type 2 diabetes mellitus with hyperosmolarity with coma: Secondary | ICD-10-CM

## 2014-08-27 DIAGNOSIS — R200 Anesthesia of skin: Secondary | ICD-10-CM | POA: Insufficient documentation

## 2014-08-27 DIAGNOSIS — R202 Paresthesia of skin: Secondary | ICD-10-CM

## 2014-08-27 DIAGNOSIS — Z91148 Patient's other noncompliance with medication regimen for other reason: Secondary | ICD-10-CM | POA: Insufficient documentation

## 2014-08-27 DIAGNOSIS — I25119 Atherosclerotic heart disease of native coronary artery with unspecified angina pectoris: Secondary | ICD-10-CM | POA: Insufficient documentation

## 2014-08-27 DIAGNOSIS — Z9114 Patient's other noncompliance with medication regimen: Secondary | ICD-10-CM | POA: Insufficient documentation

## 2014-08-27 LAB — COMPREHENSIVE METABOLIC PANEL
ALBUMIN: 2.8 g/dL — AB (ref 3.5–5.2)
ALBUMIN: 3 g/dL — AB (ref 3.5–5.2)
ALK PHOS: 102 U/L (ref 39–117)
ALK PHOS: 111 U/L (ref 39–117)
ALT: 20 U/L (ref 0–53)
ALT: 26 U/L (ref 0–53)
ANION GAP: 13 (ref 5–15)
AST: 23 U/L (ref 0–37)
AST: 30 U/L (ref 0–37)
Anion gap: 12 (ref 5–15)
BUN: 11 mg/dL (ref 6–23)
BUN: 9 mg/dL (ref 6–23)
CO2: 23 mEq/L (ref 19–32)
CO2: 22 mEq/L (ref 19–32)
Calcium: 9 mg/dL (ref 8.4–10.5)
Calcium: 9.2 mg/dL (ref 8.4–10.5)
Chloride: 103 mEq/L (ref 96–112)
Chloride: 100 mEq/L (ref 96–112)
Creatinine, Ser: 0.59 mg/dL (ref 0.50–1.35)
Creatinine, Ser: 0.58 mg/dL (ref 0.50–1.35)
GFR calc Af Amer: >90 mL/min (ref >90)
GFR calc Af Amer: >90 mL/min (ref >90)
GFR calc non Af Amer: >90 mL/min (ref >90)
GFR calc non Af Amer: >90 mL/min (ref >90)
Glucose, Bld: 181 mg/dL — ABNORMAL HIGH (ref 70–99)
Glucose, Bld: 226 mg/dL — ABNORMAL HIGH (ref 70–99)
POTASSIUM: 3.8 meq/L (ref 3.7–5.3)
POTASSIUM: 4.4 meq/L (ref 3.7–5.3)
SODIUM: 138 meq/L (ref 137–147)
SODIUM: 135 meq/L — AB (ref 137–147)
TOTAL PROTEIN: 6.9 g/dL (ref 6.0–8.3)
TOTAL PROTEIN: 7.6 g/dL (ref 6.0–8.3)
Total Bilirubin: 0.6 mg/dL (ref 0.3–1.2)
Total Bilirubin: 0.9 mg/dL (ref 0.3–1.2)

## 2014-08-27 LAB — CBC
HCT: 40 % (ref 39.0–52.0)
HEMOGLOBIN: 13.8 g/dL (ref 13.0–17.0)
MCH: 31.3 pg (ref 26.0–34.0)
MCHC: 34.5 g/dL (ref 30.0–36.0)
MCV: 90.7 fL (ref 78.0–100.0)
Platelets: 152 10*3/uL (ref 150–400)
RBC: 4.41 MIL/uL (ref 4.22–5.81)
RDW: 13 % (ref 11.5–15.5)
WBC: 12.7 10*3/uL — AB (ref 4.0–10.5)

## 2014-08-27 LAB — HEMOGLOBIN A1C
Hgb A1c MFr Bld: 13.6 % — ABNORMAL HIGH (ref <5.7)
MEAN PLASMA GLUCOSE: 344 mg/dL — AB (ref <117)

## 2014-08-27 LAB — GLUCOSE, CAPILLARY
GLUCOSE-CAPILLARY: 222 mg/dL — AB (ref 70–99)
Glucose-Capillary: 269 mg/dL — ABNORMAL HIGH (ref 70–99)
Glucose-Capillary: 276 mg/dL — ABNORMAL HIGH (ref 70–99)
Glucose-Capillary: 248 mg/dL — ABNORMAL HIGH (ref 70–99)
Glucose-Capillary: 234 mg/dL — ABNORMAL HIGH (ref 70–99)

## 2014-08-27 LAB — LIPID PANEL
Cholesterol: 228 mg/dL — ABNORMAL HIGH (ref 0–200)
LDL CALC: UNDETERMINED mg/dL (ref 0–99)
Triglycerides: 1324 mg/dL — ABNORMAL HIGH (ref <150)
VLDL: UNDETERMINED mg/dL (ref 0–40)

## 2014-08-27 LAB — PRO B NATRIURETIC PEPTIDE: PRO B NATRI PEPTIDE: 148.5 pg/mL — AB (ref 0–125)

## 2014-08-27 LAB — MAGNESIUM: Magnesium: 1.7 mg/dL (ref 1.5–2.5)

## 2014-08-27 LAB — TROPONIN I: Troponin I: <0.3 ng/mL (ref <0.30)

## 2014-08-27 MED ORDER — ATORVASTATIN CALCIUM 20 MG PO TABS: 20.0000 mg | ORAL_TABLET | Freq: Every day | ORAL | Status: DC

## 2014-08-27 MED ORDER — INSULIN ASPART 100 UNIT/ML ~~LOC~~ SOLN
8.0000 [IU] | Freq: Three times a day (TID) | SUBCUTANEOUS | Status: DC
  Administered 2014-08-27: 8 [IU] via SUBCUTANEOUS
  Administered 2014-08-27: 15 [IU] via SUBCUTANEOUS

## 2014-08-27 MED ORDER — INSULIN ASPART 100 UNIT/ML ~~LOC~~ SOLN: 10.0000 [IU] | Freq: Three times a day (TID) | SUBCUTANEOUS | Status: DC

## 2014-08-27 MED ORDER — INSULIN ASPART 100 UNIT/ML ~~LOC~~ SOLN
15.0000 [IU] | Freq: Three times a day (TID) | SUBCUTANEOUS | Status: DC
  Administered 2014-08-28 (×2): 15 [IU] via SUBCUTANEOUS

## 2014-08-27 MED ORDER — ATORVASTATIN CALCIUM 40 MG PO TABS
40.0000 mg | ORAL_TABLET | Freq: Every day | ORAL | Status: DC
  Administered 2014-08-27 – 2014-08-30 (×4): 40 mg via ORAL
  Filled 2014-08-27 (×5): qty 1

## 2014-08-27 MED ORDER — INSULIN ASPART 100 UNIT/ML ~~LOC~~ SOLN
0.0000 [IU] | SUBCUTANEOUS | Status: DC
  Administered 2014-08-27: 7 [IU] via SUBCUTANEOUS
  Administered 2014-08-28 (×3): 4 [IU] via SUBCUTANEOUS
  Administered 2014-08-28: 11 [IU] via SUBCUTANEOUS
  Administered 2014-08-28 (×2): 7 [IU] via SUBCUTANEOUS
  Administered 2014-08-29: 3 [IU] via SUBCUTANEOUS
  Administered 2014-08-29: 7 [IU] via SUBCUTANEOUS
  Administered 2014-08-29 – 2014-08-30 (×3): 4 [IU] via SUBCUTANEOUS
  Administered 2014-08-30: 3 [IU] via SUBCUTANEOUS

## 2014-08-27 NOTE — Progress Notes (Signed)
Received consult for OP education per dietician. Patient does not have insurance. Case management has seen patient and will get a follow up appointment at [facility] per care manager note. Will need PCP to follow diabetes management. Harvel Ricks RN BSN CDE

## 2014-08-27 NOTE — Progress Notes (Signed)
[place] TEAM 1 - Stepdown/ICU TEAM Progress Note  Howard Mcmahon [id number] DOB: 1971-04-16 DOA: 08/26/2014 PCP: Lorayne Marek, MD  Admit HPI / Brief Narrative: Howard Mcmahon is a 43 y.o. BM PMHx diabetes type II uncontrolled, hypertension, hyperlipidemia, coronary artery disease, status post stent placement in the past who is a very poor historian. He was sleepy and groggy throughout the time that I was with him. Based on the ED provider notes, it appears that he presented with complaints of chest pain ongoing for one week. And then he also mentioned tingling in the left hand. When I spoke to him he denied any chest pain. He said the tingling has resolved. He was found to have elevated blood sugars and was started on an insulin infusion. He denies any nausea, vomiting or abdominal pain. He denies noncompliance with his medications. Denies any leg swelling. No generalized or focal weakness. He's unable to provide me any information regarding his symptom of chest pain that he presented with. So, overall a very poor historian.  HPI/Subjective: 12/18 A/O 4, patient states he has been noncompliant with diabetic diet, and medication. Patient requests to be discharged   Assessment/Plan: Hyperosmolar Hyperglycemic Nonketotic Syndrome (HHNS)  -12/17 hemoglobin A1c =13.6 -Continue insulin 70/3024 unitsBID -NovoLog 15 units QAC -Increase SSI to resistant  HTN -Within AHA/ADA guidelines -Continue lisinopril 40 mg BID  Chest pain -Resolved most likely secondary to HHNS -Continue antiplatelet agents. (stent placed about 3 years ago) -Troponin 2 negative -Echocardiogram nondiagnostic for CP see results below  ADDENDUM; patient with run of SVT as well as increased temperature, obtain 12-lead EKG, troponin 3, BNP. Awaiting CMP and magnesium.  Coronary artery disease -S/P stent in the past:  -Continue Plavix 75 mg daily -Continue aspirin 81 mg daily  HLD -Not within ADA guidelines, TG  1324. -Start Lipitor 40 mg daily, will need to be titrated up by his PCP. -Patient most likely will require niacin in addition to Lipitor, but will hold off on starting multiple medications at the same time.  Leukocytosis -Patient also has an increased temperature, no clear source of infection therefore will hold on antibiotics. -Obtain urine culture, blood culture -Urine Legionella, urine strep, influenza panel  Left hand tingling:  -Resolved Unclear etiology.  -MRI brain/C-spine nondiagnostic for cause.  Noncompliance with medication -Counseled patient at length concerning the sequela of not controlling his diabetes to include death      Code Status: FULL Family Communication: Mother present at time of exam Disposition Plan: Control of diabetes    Consultants: NA   Procedure/Significant Events: 12/17 echocardiogram;Left ventricle: Systolic function wasnormal. -LVEF= 55%- 65%.  12/17 carotid Doppler; 1-39% ICA stenosis. Vertebral artery flow is antegrade.  12/17 CT head without contrast;No acute intracranial findings. 12/17 MRI brain and C-spine;- Motion degraded head MRI without evidence of acute intracranial abnormality.   Culture   Antibiotics:   DVT prophylaxis:  Subcutaneous heparin   Devices NA   LINES / TUBES:      Continuous Infusions: . sodium chloride 125 mL/hr at 08/26/14 2015    Objective: VITAL SIGNS: Temp: 99.8 F (37.7 C) (12/18 1500) Temp Source: Oral (12/18 1500) BP: 115/75 mmHg (12/18 0800) Pulse Rate: 84 (12/18 0800) SPO2; FIO2:   Intake/Output Summary (Last 24 hours) at 08/27/14 1845 Last data filed at 08/27/14 1100  Gross per 24 hour  Intake   1600 ml  Output      0 ml  Net   1600 ml     Exam: General:  A/O 4, NAD, No acute respiratory distress Lungs: Clear to auscultation bilaterally without wheezes or crackles Cardiovascular: Regular rate and rhythm without murmur gallop or rub normal S1 and S2 Abdomen:  Morbidly obese, Nontender, nondistended, soft, bowel sounds positive, no rebound, no ascites, no appreciable mass Extremities: No significant cyanosis, clubbing, or edema bilateral lower extremities  Data Reviewed: Basic Metabolic Panel:  Recent Labs Lab 08/26/14 0250 08/26/14 0635 08/26/14 1142 08/26/14 1535 08/26/14 1927 08/27/14 0327  NA 129* 133* 132* 135* 136* 138  K 4.0 3.9 3.8 3.6* 3.6* 3.8  CL 93* 104 98 101 101 103  CO2 17*  --  18* 19 19 23   GLUCOSE 411* 424* 248* 179* 197* 181*  BUN 14 20 12 12 12 11   CREATININE 0.66 0.80 0.54 0.54 0.60 0.59  CALCIUM 9.5  --  9.2 9.5 9.2 9.0   Liver Function Tests:  Recent Labs Lab 08/27/14 0327  AST 23  ALT 20  ALKPHOS 102  BILITOT 0.6  PROT 6.9  ALBUMIN 2.8*   No results for input(s): LIPASE, AMYLASE in the last 168 hours. No results for input(s): AMMONIA in the last 168 hours. CBC:  Recent Labs Lab 08/26/14 0250 08/26/14 0635 08/27/14 0327  WBC 12.9*  --  12.7*  HGB 15.6 16.0 13.8  HCT 42.8 47.0 40.0  MCV 92.0  --  90.7  PLT 188  --  152   Cardiac Enzymes:  Recent Labs Lab 08/26/14 1142 08/26/14 1927  TROPONINI <0.30 <0.30   BNP (last 3 results)  Recent Labs  01/11/14 0445  PROBNP 633.1*   CBG:  Recent Labs Lab 08/26/14 2201 08/27/14 0814 08/27/14 1158 08/27/14 1359 08/27/14 1626  GLUCAP 156* 269* 276* 248* 234*    Recent Results (from the past 240 hour(s))  MRSA PCR Screening     Status: None   Collection Time: 08/26/14  8:32 AM  Result Value Ref Range Status   MRSA by PCR NEGATIVE NEGATIVE Final    Comment:        The GeneXpert MRSA Assay (FDA approved for NASAL specimens only), is one component of a comprehensive MRSA colonization surveillance program. It is not intended to diagnose MRSA infection nor to guide or monitor treatment for MRSA infections.      Studies:  Recent x-ray studies have been reviewed in detail by the Attending Physician  Scheduled  Meds:  Scheduled Meds: . aspirin EC  81 mg Oral Daily  . clopidogrel  75 mg Oral Q breakfast  . heparin  5,000 Units Subcutaneous 3 times per day  . insulin aspart  8 Units Subcutaneous TID WC  . insulin aspart protamine- aspart  24 Units Subcutaneous BID WC  . lisinopril  40 mg Oral BID  . LORazepam  0.5 mg Intravenous Once  . nicotine  14 mg Transdermal Daily    Time spent on care of this patient: 40 mins   Allie Bossier , MD   [facility] Office  [phone] Pager [phone]  On-Call/Text Page:      [email or web address]      password [email or web address]  If 7PM-7AM, please contact night-coverage [email or web address] Password [email or web address] 08/27/2014, 6:45 PM   LOS: 1 day

## 2014-08-27 NOTE — Progress Notes (Addendum)
Physician notified: Sherral Hammers At: 1522  Regarding: BG 247 one hour after 15 units novolog and eating. Pt continues to take tele off, reeval SDU need? Elopement risk. Thanks. Awaiting return response.   Returned Response at: 1522  Order(s): medical recommendations: pt needs to stay another night to help control BG.  Physician notified: Sherral Hammers At: 1530  Regarding: Pt frustrated but agreed to stay tonight. Per charge RN: please consider moving to tele/MS.    Physician notified: Sherral Hammers At: [place]  Regarding: FYI ST at 104, temperature at 100F.

## 2014-08-27 NOTE — Discharge Summary (Signed)
Physician Discharge Summary  Howard Mcmahon [id number] DOB: 1971-02-08 DOA: 08/26/2014  PCP: Lorayne Marek, MD  Admit date: 08/26/2014 Discharge date: 08/27/2014  Time spent: 40 minutes  Recommendations for Outpatient Follow-up:  Hyperosmolar Hyperglycemic Nonketotic Syndrome (HHNS)  -12/17 hemoglobin A1c =13.6 -Continue insulin 70/30 32 units BID -NovoLog 8 units QAC -Follow-up once [facility] in [place]  HTN -Within AHA/ADA guidelines -Continue lisinopril 40 mg BID -Continue metoprolol 12.5 mg BID -Follow-up with [facility], [facility] in [place]  SVT episodic, asymptomatic -Continue metoprolol 12.5 mg BID  chest pain -Resolved most likely secondary to HHNS -Continue antiplatelet agents Plavix 75 mg + aspirin 81 mg daily. (stent placed about 3 years ago)  CAD -See above  HLD -Not within ADA guidelines, TG 1324. -Continue Lipitor 40 mg daily, will need to be titrated up by his PCP. -Patient most likely will require niacin in addition to Lipitor, but will hold off on starting multiple medications at the same time.  HCAP -Leukocytosis and fever have resolved  -Discharge on cefuroxime 500mg  BID to complete 7 day course of treatment  Left hand tingling:  -Resolved Unclear etiology. MRI of the brain nondiagnostic for cause.   -negative focal neurological deficits   Noncompliance with medication -Counseled extensively on sequela of not controlling diabetes to include loss of vision, kidney failure, loss of limb, and death       Discharge Diagnoses:  Principal Problem:   Hyperglycemia due to type 2 diabetes mellitus Active Problems:   DKA (diabetic ketoacidoses)   Essential hypertension, benign   CAD (coronary artery disease)   Chest pain   Paresthesia of left arm   Discharge Condition: Stable  Diet recommendation: American diabetic Association  Filed Weights   08/26/14 0247  Weight:  127.007 kg (280 lb)    History of present illness:  Howard Mcmahon is a 43 y.o. BM PMHx diabetes type II uncontrolled, hypertension, hyperlipidemia, coronary artery disease, status post stent placement in the past who is a very poor historian. He was sleepy and groggy throughout the time that I was with him. Based on the ED provider notes, it appears that he presented with complaints of chest pain ongoing for one week. And then he also mentioned tingling in the left hand. When I spoke to him he denied any chest pain. He said the tingling has resolved. He was found to have elevated blood sugars and was started on an insulin infusion. He denies any nausea, vomiting or abdominal pain. He denies noncompliance with his medications. Denies any leg swelling. No generalized or focal weakness. He's unable to provide me any information regarding his symptom of chest pain that he presented with. So, overall a very poor historian. Noncompliant patient, who was found to have uncontrolled diabetes and hypertension. Placed on appropriate medication. In addition patient was found to have most likely HCAP which was treated with appropriate antibiotics.   Procedure/Significant Events: 12/17 echocardiogram;Left ventricle: Systolic function wasnormal. -LVEF= 55%- 65%.  12/17 carotid Doppler; 1-39% ICA stenosis. Vertebral artery flow is antegrade.  12/17 CT head without contrast;No acute intracranial findings. 12/17 MRI brain and C-spine;- Motion degraded head MRI without evidence of acute intracranial abnormality. 12/19 PCXR;Vascular congestion, with increased interstitial markings,. Pneumonia?  Culture Blood culture pending Sputum culture pending  Antibiotics: Zosyn 12/20>>  Vancomycin 12/20>>    Discharge Exam: Filed Vitals:   08/26/14 2348 08/27/14 0353 08/27/14 0358 08/27/14 0700  BP: 105/74  122/78   Pulse: 70  97  Temp:  98.2 F (36.8 C)  98.6 F (37 C)  TempSrc:  Oral  Oral  Resp: 16  17    Height:      Weight:      SpO2: 99%  99%     General: A/O 4, NAD, No acute respiratory distress Lungs: Bibasilar crackles, negative wheezing  Cardiovascular: Tachycardic, Regular rhythm without murmur gallop or rub normal S1 and S2 Abdomen: Morbidly obese, Nontender, nondistended, soft, bowel sounds positive, no rebound, no ascites, no appreciable mass Extremities: No significant cyanosis, clubbing, or edema bilateral lower extremities  Discharge Instructions     Medication List    ASK your doctor about these medications        aspirin EC 81 MG tablet  Take 81 mg by mouth daily.     ciprofloxacin 500 MG tablet  Commonly known as:  CIPRO  Take 1 tablet (500 mg total) by mouth 2 (two) times daily.     clopidogrel 75 MG tablet  Commonly known as:  PLAVIX  Take 75 mg by mouth daily with breakfast.     insulin NPH-regular Human (70-30) 100 UNIT/ML injection  Commonly known as:  NOVOLIN 70/30  Inject 30 Units into the skin 2 (two) times daily with a meal.     lisinopril 20 MG tablet  Commonly known as:  PRINIVIL,ZESTRIL  Take 40 mg by mouth 2 (two) times daily.     oxyCODONE 5 MG immediate release tablet  Commonly known as:  Oxy IR/ROXICODONE  Take 1 tablet (5 mg total) by mouth every 4 (four) hours as needed for moderate pain.     senna-docusate 8.6-50 MG per tablet  Commonly known as:  Senokot-S  Take 1 tablet by mouth 2 (two) times daily.       No Known Allergies    The results of significant diagnostics from this hospitalization (including imaging, microbiology, ancillary and laboratory) are listed below for reference.    Significant Diagnostic Studies: Ct Head Wo Contrast  08/26/2014   CLINICAL DATA:  Left arm and hand tingling for 2 days  EXAM: CT HEAD WITHOUT CONTRAST  TECHNIQUE: Contiguous axial images were obtained from the base of the skull through the vertex without intravenous contrast.  COMPARISON:  01/09/2014  FINDINGS: Skull and Sinuses:No acute  fracture destructive process. There is no sinus effusion. Mucosal thickening in the left nasal cavity with rightward deviation of the nasal septum, likely with remote septal fracture. There is chronic thickening of the adenoid tonsil.  Orbits: No acute abnormality.  Brain: No evidence of acute infarction, hemorrhage, hydrocephalus, or mass lesion/mass effect. There is mild but premature small vessel disease with ischemic gliosis around the lateral ventricles and the discrete low-attenuation in the subcortical left frontal white matter. These changes are better seen on preceding brain MRI. A choroid fissure cyst is incidentally noted on the left.  IMPRESSION: 1. No acute intracranial findings. 2. Mild but premature small vessel disease.   Electronically Signed   By: Jorje Guild M.D.   On: 08/26/2014 05:42   Mr Brain Wo Contrast  08/26/2014   CLINICAL DATA:  Numbness and tingling involving the left hand and arm.  EXAM: MRI HEAD WITHOUT CONTRAST  MRI CERVICAL SPINE WITHOUT CONTRAST  TECHNIQUE: Multiplanar, multiecho pulse sequences of the brain and surrounding structures, and cervical spine, to include the craniocervical junction and cervicothoracic junction, were obtained without intravenous contrast.  COMPARISON:  Head CT 08/26/2014 and MRI 01/10/2014  FINDINGS: MRI HEAD FINDINGS  Images are  mildly to moderately degraded by motion artifact. There is no evidence of acute infarct, mass, midline shift, or extra-axial fluid collection. A punctate focus of remote microhemorrhage is noted in the right occipital lobe. Small foci of T2 hyperintensity throughout the subcortical and deep cerebral white matter and left pons overall do not appear significantly changed from the prior MRI and are nonspecific but compatible with mild chronic small vessel ischemic disease which is advanced for age. An old lacunar infarct is again seen in the left frontal subcortical white matter. A small left choroidal fissure cyst is  incidentally noted.  Orbits are unremarkable. Paranasal sinuses and mastoid air cells are clear. Major intracranial vascular flow voids are preserved.  MRI CERVICAL SPINE FINDINGS  Limited evaluation due to motion artifact. The patient discontinued the examination prior to completion, and axial T2 spin echo images were not obtained. Axial gradient echo images are incomplete and essentially nondiagnostic due to severe motion.  There is straightening of the cervical spine without listhesis. Intervertebral disc space heights are overall preserved. Mild anterior endplate spurring and minimal degenerative marrow changes are present from C4-C6 with evidence of minimal disc bulging at these levels without significant spinal stenosis. No gross vertebral marrow edema is identified. Cervical spinal cord is normal in caliber without gross signal abnormality identified. Prominent adenoidal tissue is noted in the nasopharynx.  IMPRESSION: 1. Motion degraded head MRI without evidence of acute intracranial abnormality. 2. Mild chronic small vessel ischemic disease, similar to the prior MRI. 3. Incomplete and severely degraded cervical spine MRI. Minimal cervical disc degeneration without significant spinal stenosis.   Electronically Signed   By: Logan Bores   On: 08/26/2014 11:31   Mr Cervical Spine Wo Contrast  08/26/2014   CLINICAL DATA:  Numbness and tingling involving the left hand and arm.  EXAM: MRI HEAD WITHOUT CONTRAST  MRI CERVICAL SPINE WITHOUT CONTRAST  TECHNIQUE: Multiplanar, multiecho pulse sequences of the brain and surrounding structures, and cervical spine, to include the craniocervical junction and cervicothoracic junction, were obtained without intravenous contrast.  COMPARISON:  Head CT 08/26/2014 and MRI 01/10/2014  FINDINGS: MRI HEAD FINDINGS  Images are mildly to moderately degraded by motion artifact. There is no evidence of acute infarct, mass, midline shift, or extra-axial fluid collection. A punctate  focus of remote microhemorrhage is noted in the right occipital lobe. Small foci of T2 hyperintensity throughout the subcortical and deep cerebral white matter and left pons overall do not appear significantly changed from the prior MRI and are nonspecific but compatible with mild chronic small vessel ischemic disease which is advanced for age. An old lacunar infarct is again seen in the left frontal subcortical white matter. A small left choroidal fissure cyst is incidentally noted.  Orbits are unremarkable. Paranasal sinuses and mastoid air cells are clear. Major intracranial vascular flow voids are preserved.  MRI CERVICAL SPINE FINDINGS  Limited evaluation due to motion artifact. The patient discontinued the examination prior to completion, and axial T2 spin echo images were not obtained. Axial gradient echo images are incomplete and essentially nondiagnostic due to severe motion.  There is straightening of the cervical spine without listhesis. Intervertebral disc space heights are overall preserved. Mild anterior endplate spurring and minimal degenerative marrow changes are present from C4-C6 with evidence of minimal disc bulging at these levels without significant spinal stenosis. No gross vertebral marrow edema is identified. Cervical spinal cord is normal in caliber without gross signal abnormality identified. Prominent adenoidal tissue is noted in the nasopharynx.  IMPRESSION: 1. Motion degraded head MRI without evidence of acute intracranial abnormality. 2. Mild chronic small vessel ischemic disease, similar to the prior MRI. 3. Incomplete and severely degraded cervical spine MRI. Minimal cervical disc degeneration without significant spinal stenosis.   Electronically Signed   By: Logan Bores   On: 08/26/2014 11:31   Dg Chest Port 1 View  08/26/2014   CLINICAL DATA:  Left-sided chest pain  EXAM: PORTABLE CHEST - 1 VIEW  COMPARISON:  01/09/2014  FINDINGS: Chronic bronchitic markings. There is no edema,  consolidation, effusion, or pneumothorax. Normal heart size. Stable aortic and hilar contours.  IMPRESSION: Chronic bronchitic changes.  No acute cardiopulmonary disease.   Electronically Signed   By: Jorje Guild M.D.   On: 08/26/2014 03:36    Microbiology: Recent Results (from the past 240 hour(s))  MRSA PCR Screening     Status: None   Collection Time: 08/26/14  8:32 AM  Result Value Ref Range Status   MRSA by PCR NEGATIVE NEGATIVE Final    Comment:        The GeneXpert MRSA Assay (FDA approved for NASAL specimens only), is one component of a comprehensive MRSA colonization surveillance program. It is not intended to diagnose MRSA infection nor to guide or monitor treatment for MRSA infections.      Labs: Basic Metabolic Panel:  Recent Labs Lab 08/26/14 0250 08/26/14 0635 08/26/14 1142 08/26/14 1535 08/26/14 1927 08/27/14 0327  NA 129* 133* 132* 135* 136* 138  K 4.0 3.9 3.8 3.6* 3.6* 3.8  CL 93* 104 98 101 101 103  CO2 17*  --  18* 19 19 23   GLUCOSE 411* 424* 248* 179* 197* 181*  BUN 14 20 12 12 12 11   CREATININE 0.66 0.80 0.54 0.54 0.60 0.59  CALCIUM 9.5  --  9.2 9.5 9.2 9.0   Liver Function Tests:  Recent Labs Lab 08/27/14 0327  AST 23  ALT 20  ALKPHOS 102  BILITOT 0.6  PROT 6.9  ALBUMIN 2.8*   No results for input(s): LIPASE, AMYLASE in the last 168 hours. No results for input(s): AMMONIA in the last 168 hours. CBC:  Recent Labs Lab 08/26/14 0250 08/26/14 0635 08/27/14 0327  WBC 12.9*  --  12.7*  HGB 15.6 16.0 13.8  HCT 42.8 47.0 40.0  MCV 92.0  --  90.7  PLT 188  --  152   Cardiac Enzymes:  Recent Labs Lab 08/26/14 1142 08/26/14 1927  TROPONINI <0.30 <0.30   BNP: BNP (last 3 results)  Recent Labs  01/11/14 0445  PROBNP 633.1*   CBG:  Recent Labs Lab 08/26/14 1752 08/26/14 1850 08/26/14 2000 08/26/14 2201 08/27/14 0814  GLUCAP 96 209* 161* 156* 269*       Signed:  Dia Crawford, MD [facility] [phone] pager

## 2014-08-27 NOTE — Progress Notes (Signed)
OT Note  Patient Details Name: Howard Mcmahon MRN: [id number] DOB: Mar 19, 1971   Cancelled Treatment:    Reason Eval/Treat Not Completed: OT screened, pt denies any OT needs, reports symptoms have resolved L arm and that he has been getting up and doing things in room without difficulty. OT will sign off  Howard Mcmahon A 08/27/2014, 12:05 PM

## 2014-08-27 NOTE — Progress Notes (Signed)
Physician notified: Sherral Hammers At: 1230  Regarding: BG 276, higher than this AM, gave 11units novolog + 24 70/30 This AM. Still want 8 units novolog with lunch?  Awaiting return response.   Returned Response at: 1233  Order(s): Give 15 units Novolog with lunch. Hold DC at this time.

## 2014-08-27 NOTE — Evaluation (Signed)
Physical Therapy Evaluation and Discharge Patient Details Name: Howard Mcmahon MRN: [id number] DOB: 07-Apr-1971 Today's Date: 08/27/2014   History of Present Illness  Howard Mcmahon is a 43 y.o. male with a past medical history of diabetes, hypertension, hyperlipidemia, coronary artery disease, status post stent placement in the past who is a very poor historian. He was sleepy and groggy throughout the time that I was with him. Based on the ED provider notes, it appears that he presented with complaints of chest pain ongoing for one week. And then he also mentioned tingling in the left hand. When I spoke to him he denied any chest pain. He said the tingling has resolved. He was found to have elevated blood sugars and was started on an insulin infusion. He denies any nausea, vomiting or abdominal pain. He denies noncompliance with his medications. Denies any leg swelling. No generalized or focal weakness. He's unable to provide me any information regarding his symptom of chest pain that he presented with.  Clinical Impression  Patient evaluated by Physical Therapy with no further acute PT needs identified. All education has been completed and the patient has no further questions. Ambulates generally well, declines use of assistive device but holds IV pole while ambulating. States he is ready to go home now. We discussed safety with mobility, and increasing physical activity with possible gym membership and he agrees. Recommended to follow RD recommendations closely and pt verbalizes understanding. See below for any follow-up Physial Therapy or equipment needs. PT is signing off. Thank you for this referral.     Follow Up Recommendations No PT follow up    Equipment Recommendations  None recommended by PT    Recommendations for Other Services       Precautions / Restrictions Precautions Precautions: None Restrictions Weight Bearing Restrictions: No      Mobility  Bed Mobility Overal bed  mobility: Modified Independent                Transfers Overall transfer level: Modified independent                  Ambulation/Gait Ambulation/Gait assistance: Supervision Ambulation Distance (Feet): 520 Feet Assistive device:  (Held IV pole, declines use of assistive device) Gait Pattern/deviations: Step-through pattern Gait velocity: decreased   General Gait Details: Ambulates cautiously. Prefers to hold IV pole, and declines to use assistive device. No loss of balance noted. ambulates generally well. States he feels fine.  Stairs            Wheelchair Mobility    Modified Rankin (Stroke Patients Only)       Balance Overall balance assessment: No apparent balance deficits (not formally assessed)                                           Pertinent Vitals/Pain Pain Assessment: No/denies pain  HR 115 SpO2 100% on room air.    Home Living Family/patient expects to be discharged to:: Private residence Living Arrangements: Spouse/significant other Available Help at Discharge: Family;Available 24 hours/day Type of Home: House Home Access: Level entry     Home Layout: One level Home Equipment: None      Prior Function Level of Independence: Independent               Hand Dominance   Dominant Hand: Right    Extremity/Trunk Assessment   Upper Extremity  Assessment: Defer to OT evaluation           Lower Extremity Assessment: Overall WFL for tasks assessed         Communication   Communication: No difficulties  Cognition Arousal/Alertness: Awake/alert Behavior During Therapy: WFL for tasks assessed/performed Overall Cognitive Status: Within Functional Limits for tasks assessed                      General Comments General comments (skin integrity, edema, etc.): Discussed pt becoming more active with this health at d/c. States he plans to join a gym and is considering getting exercise equipment for  his house. Recommended a partner/friend to attend gym classes for a support system and motivation. Discussed being cognizant of healthy food choices and following RD recommendations.    Exercises        Assessment/Plan    PT Assessment Patent does not need any further PT services  PT Diagnosis Acute pain (admitted with chest pain)   PT Problem List    PT Treatment Interventions     PT Goals (Current goals can be found in the Care Plan section) Acute Rehab PT Goals Patient Stated Goal: go home today PT Goal Formulation: All assessment and education complete, DC therapy    Frequency     Barriers to discharge        Co-evaluation               End of Session   Activity Tolerance: Patient tolerated treatment well Patient left: in bed;with call bell/phone within reach;with family/visitor present           Time: [id number] PT Time Calculation (min) (ACUTE ONLY): 14 min   Charges:   PT Evaluation $Initial PT Evaluation Tier I: 1 Procedure     PT G CodesEllouise Newer 08/27/2014, 12:03 PM Elayne Snare, [place]

## 2014-08-27 NOTE — Progress Notes (Signed)
  RD consulted for nutrition education regarding diabetes. Patient with a history of diabetes. He was sleepy during RD visit. Wife unavailable at this time and mother was on the phone during RD visit.   Lab Results  Component Value Date   HGBA1C 13.6* 08/26/2014    RD provided "Carbohydrate Counting for People with Diabetes" handout from the Academy of [employer] and Dietetics. Discussed different food groups and their effects on blood sugar, emphasizing carbohydrate-containing foods. Provided list of carbohydrates and recommended serving sizes of common foods.  Discussed importance of controlled and consistent carbohydrate intake throughout the day. Provided examples of ways to balance meals/snacks and encouraged intake of high-fiber, whole grain complex carbohydrates. Teach back method used.  Expect poor compliance. Patient was dozing off during entire RD visit. Needs OP diabetes diet education, will order. Possibly being discharged later today.   Body mass index is 34.1 kg/(m^2). Pt meets criteria for obesity, class 1 based on current BMI.  Current diet order is CHO modified, patient is consuming approximately 100% of meals at this time. Labs and medications reviewed. No further nutrition interventions warranted at this time. RD contact information provided. If additional nutrition issues arise, please re-consult RD.   Molli Barrows, RD, LDN, [place] Pager [phone] After Hours Pager [phone]

## 2014-08-27 NOTE — Progress Notes (Addendum)
Pt watching diabetic patient education videos. Awaiting nutrition education. Planned DC later today after lunch for insulin adjustments.

## 2014-08-27 NOTE — Progress Notes (Signed)
Pt is able to place on mask when ready. Pt encouraged to call RT if pt needs any assistance. No distress noted.

## 2014-08-28 ENCOUNTER — Inpatient Hospital Stay (HOSPITAL_COMMUNITY): Payer: Self-pay

## 2014-08-28 DIAGNOSIS — I471 Supraventricular tachycardia: Secondary | ICD-10-CM

## 2014-08-28 DIAGNOSIS — E0810 Diabetes mellitus due to underlying condition with ketoacidosis without coma: Secondary | ICD-10-CM | POA: Insufficient documentation

## 2014-08-28 LAB — COMPREHENSIVE METABOLIC PANEL
ALBUMIN: 2.6 g/dL — AB (ref 3.5–5.2)
ALK PHOS: 105 U/L (ref 39–117)
ALT: 16 U/L (ref 0–53)
AST: 18 U/L (ref 0–37)
Anion gap: 13 (ref 5–15)
BILIRUBIN TOTAL: 1.4 mg/dL — AB (ref 0.3–1.2)
BUN: 9 mg/dL (ref 6–23)
CHLORIDE: 100 meq/L (ref 96–112)
CO2: 21 mEq/L (ref 19–32)
Calcium: 9 mg/dL (ref 8.4–10.5)
Creatinine, Ser: 0.6 mg/dL (ref 0.50–1.35)
GFR calc Af Amer: >90 mL/min (ref >90)
Glucose, Bld: 223 mg/dL — ABNORMAL HIGH (ref 70–99)
POTASSIUM: 3.8 meq/L (ref 3.7–5.3)
Sodium: 134 mEq/L — ABNORMAL LOW (ref 137–147)
Total Protein: 7.2 g/dL (ref 6.0–8.3)

## 2014-08-28 LAB — CBC WITH DIFFERENTIAL/PLATELET
Basophils Absolute: 0 10*3/uL (ref 0.0–0.1)
Basophils Relative: 0 % (ref 0–1)
EOS PCT: 1 % (ref 0–5)
Eosinophils Absolute: 0.1 10*3/uL (ref 0.0–0.7)
HCT: 38.3 % — ABNORMAL LOW (ref 39.0–52.0)
Hemoglobin: 13.1 g/dL (ref 13.0–17.0)
LYMPHS ABS: 1.9 10*3/uL (ref 0.7–4.0)
Lymphocytes Relative: 15 % (ref 12–46)
MCH: 31 pg (ref 26.0–34.0)
MCHC: 34.2 g/dL (ref 30.0–36.0)
MCV: 90.8 fL (ref 78.0–100.0)
Monocytes Absolute: 1 10*3/uL (ref 0.1–1.0)
Monocytes Relative: 8 % (ref 3–12)
Neutro Abs: 9.2 10*3/uL — ABNORMAL HIGH (ref 1.7–7.7)
Neutrophils Relative %: 76 % (ref 43–77)
PLATELETS: 154 10*3/uL (ref 150–400)
RBC: 4.22 MIL/uL (ref 4.22–5.81)
RDW: 13 % (ref 11.5–15.5)
WBC: 12.2 10*3/uL — ABNORMAL HIGH (ref 4.0–10.5)

## 2014-08-28 LAB — INFLUENZA PANEL BY PCR (TYPE A & B)
H1N1 flu by pcr: NOT DETECTED
INFLAPCR: NEGATIVE
Influenza B By PCR: NEGATIVE

## 2014-08-28 LAB — BASIC METABOLIC PANEL
ANION GAP: 13 (ref 5–15)
BUN: 9 mg/dL (ref 6–23)
CALCIUM: 9.1 mg/dL (ref 8.4–10.5)
CHLORIDE: 101 meq/L (ref 96–112)
CO2: 22 mEq/L (ref 19–32)
Creatinine, Ser: 0.6 mg/dL (ref 0.50–1.35)
GFR calc non Af Amer: >90 mL/min (ref >90)
Glucose, Bld: 219 mg/dL — ABNORMAL HIGH (ref 70–99)
Potassium: 3.6 mEq/L — ABNORMAL LOW (ref 3.7–5.3)
Sodium: 136 mEq/L — ABNORMAL LOW (ref 137–147)

## 2014-08-28 LAB — TROPONIN I: Troponin I: <0.3 ng/mL (ref <0.30)

## 2014-08-28 LAB — GLUCOSE, CAPILLARY
GLUCOSE-CAPILLARY: 124 mg/dL — AB (ref 70–99)
GLUCOSE-CAPILLARY: 199 mg/dL — AB (ref 70–99)
GLUCOSE-CAPILLARY: 200 mg/dL — AB (ref 70–99)
GLUCOSE-CAPILLARY: 211 mg/dL — AB (ref 70–99)
Glucose-Capillary: 233 mg/dL — ABNORMAL HIGH (ref 70–99)
Glucose-Capillary: 287 mg/dL — ABNORMAL HIGH (ref 70–99)
Glucose-Capillary: 193 mg/dL — ABNORMAL HIGH (ref 70–99)
Glucose-Capillary: 171 mg/dL — ABNORMAL HIGH (ref 70–99)

## 2014-08-28 LAB — LACTIC ACID, PLASMA: LACTIC ACID, VENOUS: 1.3 mmol/L (ref 0.5–2.2)

## 2014-08-28 LAB — STREP PNEUMONIAE URINARY ANTIGEN: Strep Pneumo Urinary Antigen: NEGATIVE

## 2014-08-28 LAB — MAGNESIUM
MAGNESIUM: 1.6 mg/dL (ref 1.5–2.5)
Magnesium: 1.7 mg/dL (ref 1.5–2.5)

## 2014-08-28 MED ORDER — VANCOMYCIN HCL IN DEXTROSE 1-5 GM/200ML-% IV SOLN: 1000.0000 mg | Freq: Once | INTRAVENOUS | Status: DC

## 2014-08-28 MED ORDER — METOPROLOL TARTRATE 12.5 MG HALF TABLET
12.5000 mg | ORAL_TABLET | Freq: Two times a day (BID) | ORAL | Status: DC
  Administered 2014-08-28 – 2014-08-31 (×7): 12.5 mg via ORAL
  Filled 2014-08-28 (×9): qty 1

## 2014-08-28 MED ORDER — BACLOFEN 10 MG PO TABS
10.0000 mg | ORAL_TABLET | Freq: Once | ORAL | Status: AC
  Administered 2014-08-28: 10 mg via ORAL
  Filled 2014-08-28: qty 1

## 2014-08-28 MED ORDER — PIPERACILLIN-TAZOBACTAM 3.375 G IVPB 30 MIN
3.3750 g | Freq: Once | INTRAVENOUS | Status: AC
  Administered 2014-08-28: 3.375 g via INTRAVENOUS
  Filled 2014-08-28: qty 50

## 2014-08-28 MED ORDER — INSULIN ASPART PROT & ASPART (70-30 MIX) 100 UNIT/ML ~~LOC~~ SUSP
32.0000 [IU] | Freq: Two times a day (BID) | SUBCUTANEOUS | Status: DC
  Administered 2014-08-28 – 2014-08-29 (×2): 32 [IU] via SUBCUTANEOUS
  Filled 2014-08-28: qty 10

## 2014-08-28 MED ORDER — VANCOMYCIN HCL 10 G IV SOLR
2000.0000 mg | Freq: Once | INTRAVENOUS | Status: AC
  Administered 2014-08-28: 2000 mg via INTRAVENOUS
  Filled 2014-08-28: qty 2000

## 2014-08-28 MED ORDER — VANCOMYCIN HCL 10 G IV SOLR
1250.0000 mg | Freq: Three times a day (TID) | INTRAVENOUS | Status: DC
  Administered 2014-08-29 – 2014-08-30 (×4): 1250 mg via INTRAVENOUS
  Filled 2014-08-28 (×8): qty 1250

## 2014-08-28 MED ORDER — PIPERACILLIN-TAZOBACTAM 3.375 G IVPB
3.3750 g | Freq: Three times a day (TID) | INTRAVENOUS | Status: DC
  Administered 2014-08-29 – 2014-08-31 (×7): 3.375 g via INTRAVENOUS
  Filled 2014-08-28 (×10): qty 50

## 2014-08-28 MED ORDER — INSULIN ASPART PROT & ASPART (70-30 MIX) 100 UNIT/ML ~~LOC~~ SUSP
30.0000 [IU] | Freq: Two times a day (BID) | SUBCUTANEOUS | Status: DC
  Filled 2014-08-28: qty 10

## 2014-08-28 MED ORDER — INSULIN ASPART 100 UNIT/ML ~~LOC~~ SOLN
20.0000 [IU] | Freq: Three times a day (TID) | SUBCUTANEOUS | Status: DC
  Administered 2014-08-28 – 2014-08-29 (×2): 20 [IU] via SUBCUTANEOUS

## 2014-08-28 NOTE — Progress Notes (Signed)
Received report from [place] on [place].

## 2014-08-28 NOTE — Progress Notes (Signed)
Pt spiked temp of 103. On-call provider Jonette Eva, NP notified via text page. Pt also c/o cramps in rt lower leg. Order received. Will cont to monitor.

## 2014-08-28 NOTE — Progress Notes (Signed)
[place] TEAM 1 - Stepdown/ICU TEAM Progress Note  Howard Mcmahon [id number] DOB: [birth year] DOA: 08/26/2014 PCP: Lorayne Marek, MD  Admit HPI / Brief Narrative: Howard Mcmahon is a 43 y.o. BM PMHx diabetes type II uncontrolled, hypertension, hyperlipidemia, coronary artery disease, status post stent placement in the past who is a very poor historian. He was sleepy and groggy throughout the time that I was with him. Based on the ED provider notes, it appears that he presented with complaints of chest pain ongoing for one week. And then he also mentioned tingling in the left hand. When I spoke to him he denied any chest pain. He said the tingling has resolved. He was found to have elevated blood sugars and was started on an insulin infusion. He denies any nausea, vomiting or abdominal pain. He denies noncompliance with his medications. Denies any leg swelling. No generalized or focal weakness. He's unable to provide me any information regarding his symptom of chest pain that he presented with. So, overall a very poor historian.  HPI/Subjective: 12/19 A/O 4, patient states he has been noncompliant with diabetic diet, and medication in the past. Although patient for straight understands why we must gain better control his diabetes prior to discharge. Negative CP, negative SOB   Assessment/Plan: Hyperosmolar Hyperglycemic Nonketotic Syndrome (HHNS)  -12/17 hemoglobin A1c =13.6 -Increase insulin 70/30  to 32 units BID -NovoLog 20 units QAC -Increase SSI to resistant  HTN -Within AHA/ADA guidelines -Continue lisinopril 40 mg BID  SVT episodic, asymptomatic -Start metoprolol 12.5 mg BID  Chest pain -Resolved most likely secondary to HHNS -Continue antiplatelet agents. (stent placed about 3 years ago) -Troponin 2 negative -Echocardiogram nondiagnostic for CP see results below   Coronary artery disease -S/P stent in the past:  -Continue Plavix 75 mg daily -Continue aspirin 81 mg  daily  HLD -Not within ADA guidelines, TG 1324. -Start Lipitor 40 mg daily, will need to be titrated up by his PCP. -Patient most likely will require niacin in addition to Lipitor, but will hold off on starting multiple medications at the same time.  Leukocytosis -Mild leukocytosis, Stress Demargination? No clear source of infection therefore continue to hold on antibiotics. -Urine culture, blood culture pending -Urine Legionella, urine strep, influenza panel pending  Left hand tingling:  -Resolved Unclear etiology.  -MRI brain/C-spine nondiagnostic for cause.  Noncompliance with medication -Counseled patient at length concerning the sequela of not controlling his diabetes to include death      Code Status: FULL Family Communication: Mother present at time of exam Disposition Plan: Control of diabetes    Consultants: NA   Procedure/Significant Events: 12/17 echocardiogram;Left ventricle: Systolic function wasnormal. -LVEF= 55%- 65%.  12/17 carotid Doppler; 1-39% ICA stenosis. Vertebral artery flow is antegrade.  12/17 CT head without contrast;No acute intracranial findings. 12/17 MRI brain and C-spine;- Motion degraded head MRI without evidence of acute intracranial abnormality.   Culture   Antibiotics: NA  DVT prophylaxis:  Subcutaneous heparin   Devices NA   LINES / TUBES:      Continuous Infusions: . sodium chloride 125 mL/hr at 08/26/14 2015    Objective: VITAL SIGNS: Temp: 98.3 F (36.8 C) (12/19 1125) Temp Source: Oral (12/19 1125) BP: 126/84 mmHg (12/19 1125) Pulse Rate: 117 (12/19 1125) SPO2; FIO2:   Intake/Output Summary (Last 24 hours) at 08/28/14 1432 Last data filed at 08/28/14 1100  Gross per 24 hour  Intake    480 ml  Output   1850 ml  Net  -1370  ml     Exam: General: A/O 4, NAD, No acute respiratory distress Lungs: Clear to auscultation bilaterally without wheezes or crackles Cardiovascular: Regular rate and  rhythm without murmur gallop or rub normal S1 and S2 Abdomen: Morbidly obese, Nontender, nondistended, soft, bowel sounds positive, no rebound, no ascites, no appreciable mass Extremities: No significant cyanosis, clubbing, or edema bilateral lower extremities  Data Reviewed: Basic Metabolic Panel:  Recent Labs Lab 08/26/14 1927 08/27/14 0327 08/27/14 1835 08/28/14 0728 08/28/14 1257  NA 136* 138 135* 134* 136*  K 3.6* 3.8 4.4 3.8 3.6*  CL 101 103 100 100 101  CO2 19 23 22 21 22   GLUCOSE 197* 181* 226* 223* 219*  BUN 12 11 9 9 9   CREATININE 0.60 0.59 0.58 0.60 0.60  CALCIUM 9.2 9.0 9.2 9.0 9.1  MG  --   --  1.7 1.6 1.7   Liver Function Tests:  Recent Labs Lab 08/27/14 0327 08/27/14 1835 08/28/14 0728  AST 23 30 18   ALT 20 26 16   ALKPHOS 102 111 105  BILITOT 0.6 0.9 1.4*  PROT 6.9 7.6 7.2  ALBUMIN 2.8* 3.0* 2.6*   No results for input(s): LIPASE, AMYLASE in the last 168 hours. No results for input(s): AMMONIA in the last 168 hours. CBC:  Recent Labs Lab 08/26/14 0250 08/26/14 0635 08/27/14 0327 08/28/14 0728  WBC 12.9*  --  12.7* 12.2*  NEUTROABS  --   --   --  9.2*  HGB 15.6 16.0 13.8 13.1  HCT 42.8 47.0 40.0 38.3*  MCV 92.0  --  90.7 90.8  PLT 188  --  152 154   Cardiac Enzymes:  Recent Labs Lab 08/26/14 1142 08/26/14 1927 08/27/14 1920 08/28/14 0017 08/28/14 0728  TROPONINI <0.30 <0.30 <0.30 <0.30 <0.30   BNP (last 3 results)  Recent Labs  01/11/14 0445 08/27/14 1920  PROBNP 633.1* 148.5*   CBG:  Recent Labs Lab 08/27/14 2024 08/27/14 2340 08/28/14 0401 08/28/14 0833 08/28/14 1125  GLUCAP 222* 200* 211* 233* 287*    Recent Results (from the past 240 hour(s))  MRSA PCR Screening     Status: None   Collection Time: 08/26/14  8:32 AM  Result Value Ref Range Status   MRSA by PCR NEGATIVE NEGATIVE Final    Comment:        The GeneXpert MRSA Assay (FDA approved for NASAL specimens only), is one component of a comprehensive  MRSA colonization surveillance program. It is not intended to diagnose MRSA infection nor to guide or monitor treatment for MRSA infections.      Studies:  Recent x-ray studies have been reviewed in detail by the Attending Physician  Scheduled Meds:  Scheduled Meds: . aspirin EC  81 mg Oral Daily  . atorvastatin  40 mg Oral q1800  . clopidogrel  75 mg Oral Q breakfast  . heparin  5,000 Units Subcutaneous 3 times per day  . insulin aspart  0-20 Units Subcutaneous 6 times per day  . insulin aspart  20 Units Subcutaneous TID WC  . insulin aspart protamine- aspart  30 Units Subcutaneous BID WC  . lisinopril  40 mg Oral BID  . LORazepam  0.5 mg Intravenous Once  . nicotine  14 mg Transdermal Daily    Time spent on care of this patient: 40 mins   Allie Bossier , MD   [facility] Office  [phone] Pager [phone]  On-Call/Text Page:      [email or web address]  password [email or web address]  If 7PM-7AM, please contact night-coverage [email or web address] Password [email or web address] 08/28/2014, 2:32 PM   LOS: 2 days

## 2014-08-28 NOTE — Progress Notes (Signed)
MEWS score fired 3 for elevated HR AND rr. Dr. Sherral Hammers aware. Will continue to monitor.

## 2014-08-28 NOTE — Progress Notes (Signed)
ANTIBIOTIC CONSULT NOTE - INITIAL  Pharmacy Consult for vancomycin and zosyn Indication: rule out sepsis  No Known Allergies  Patient Measurements: Height: 6\' 4"  (193 cm) Weight: 269 lb 3.2 oz (122.108 kg) IBW/kg (Calculated) : 86.8 Adjusted Body Weight:   Vital Signs: Temp: 103 F (39.4 C) (12/19 2116) Temp Source: Oral (12/19 2116) BP: 131/69 mmHg (12/19 2115) Pulse Rate: 107 (12/19 2116) Intake/Output from previous day: 12/18 0701 - 12/19 0700 In: 980 [P.O.:480; I.V.:500] Out: 925 [Urine:925] Intake/Output from this shift: Total I/O In: 240 [P.O.:240] Out: -   Labs:  Recent Labs  08/26/14 0250 08/26/14 0635  08/27/14 0327 08/27/14 1835 08/28/14 0728 08/28/14 1257  WBC 12.9*  --   --  12.7*  --  12.2*  --   HGB 15.6 16.0  --  13.8  --  13.1  --   PLT 188  --   --  152  --  154  --   CREATININE 0.66 0.80  < > 0.59 0.58 0.60 0.60  < > = values in this interval not displayed. Estimated Creatinine Clearance: 171.7 mL/min (by C-G formula based on Cr of 0.6). No results for input(s): VANCOTROUGH, VANCOPEAK, VANCORANDOM, GENTTROUGH, GENTPEAK, GENTRANDOM, TOBRATROUGH, TOBRAPEAK, TOBRARND, AMIKACINPEAK, AMIKACINTROU, AMIKACIN in the last 72 hours.   Microbiology: Recent Results (from the past 720 hour(s))  MRSA PCR Screening     Status: None   Collection Time: 08/26/14  8:32 AM  Result Value Ref Range Status   MRSA by PCR NEGATIVE NEGATIVE Final    Comment:        The GeneXpert MRSA Assay (FDA approved for NASAL specimens only), is one component of a comprehensive MRSA colonization surveillance program. It is not intended to diagnose MRSA infection nor to guide or monitor treatment for MRSA infections.     Medical History: Past Medical History  Diagnosis Date  . Diabetes mellitus without complication   . Hypertension   . Macular infarction     2012  . MI (myocardial infarction)    Assessment: 43 y.o. male with a past medical history of diabetes,  hypertension, hyperlipidemia, coronary artery disease, status post stent placement in the past who is a very poor historian. Patient admitted on 12/17 with tingling in his left hand for the past two days. Patient now with fever of 103 and wbc 12.2 (stable from admit). Empiric vanc/zosyn ordered for possible sepsis. Renal function is normal. Given elevated BMI will adjust vancomycin dosing accordingly.  Goal of Therapy:  Vancomycin trough level 15-20 mcg/ml  Plan:  Measure antibiotic drug levels at steady state Follow up culture results Vancomycin 2g IV now then 1250mg  IV q8 hours  Zosyn 3.375g IV q8 hours  Erin Hearing PharmD., BCPS Clinical Pharmacist Pager [phone] 08/28/2014 9:39 PM

## 2014-08-28 NOTE — Progress Notes (Signed)
Pt transferred to 5W01. Family at bedside and aware of pt transfer.

## 2014-08-28 NOTE — Progress Notes (Signed)
Placed pt. On cpap. Pt. Tolerating well at this time. 

## 2014-08-28 NOTE — Progress Notes (Signed)
Report called Ginger RN on 5W.

## 2014-08-28 NOTE — Progress Notes (Signed)
Howard Mcmahon [id number] Admission Data: 08/28/2014 5:43 PM Attending Provider: Allie Bossier, MD  [id number], Vernon Prey, MD Consults/ Treatment Team:    Howard Mcmahon is a 43 y.o. male patient admitted from ED awake, alert  & orientated  X 3,  Full Code, VSS - Blood pressure 122/79, pulse 113, temperature 99.5 F (37.5 C), temperature source Oral, resp. rate 24, height 6\' 4"  (1.93 m), weight 122.108 kg (269 lb 3.2 oz), SpO2 95 %., no c/o shortness of breath, no c/o chest pain, no distress noted. Tele # 1 placed and pt is currently running:sinus tachycardia.   IV site WDL:  forearm right, condition patent and no redness and antecubital right, condition patent and no redness with a transparent dsg that's clean dry and intact.  Allergies:  No Known Allergies   Past Medical History  Diagnosis Date  . Diabetes mellitus without complication   . Hypertension   . Macular infarction     2012  . MI (myocardial infarction)     History:  obtained from chart review. Tobacco/alcohol: Smoked 1 packs per day for 19 years none  Pt orientation to unit, room and routine. Information packet given to patient/family and safety video watched.  Admission INP armband ID verified with patient/family, and in place. SR up x 2, fall risk assessment complete with Patient and family verbalizing understanding of risks associated with falls. Pt verbalizes an understanding of how to use the call bell and to call for help before getting out of bed.  Skin, clean-dry- intact without evidence of bruising, or skin tears.   No evidence of skin break down noted on exam. no rashes, no ecchymoses, no petechiae    Will cont to monitor and assist as needed.  Howard Mojica Margaretha Sheffield, RN 08/28/2014 5:43 PM

## 2014-08-29 DIAGNOSIS — R509 Fever, unspecified: Secondary | ICD-10-CM

## 2014-08-29 DIAGNOSIS — A419 Sepsis, unspecified organism: Secondary | ICD-10-CM

## 2014-08-29 DIAGNOSIS — J189 Pneumonia, unspecified organism: Secondary | ICD-10-CM

## 2014-08-29 DIAGNOSIS — IMO0001 Reserved for inherently not codable concepts without codable children: Secondary | ICD-10-CM | POA: Insufficient documentation

## 2014-08-29 LAB — URINE CULTURE: Colony Count: 70000

## 2014-08-29 LAB — COMPREHENSIVE METABOLIC PANEL
ALK PHOS: 113 U/L (ref 39–117)
ALT: 15 U/L (ref 0–53)
AST: 17 U/L (ref 0–37)
Albumin: 2.7 g/dL — ABNORMAL LOW (ref 3.5–5.2)
Anion gap: 13 (ref 5–15)
BUN: 10 mg/dL (ref 6–23)
CHLORIDE: 102 meq/L (ref 96–112)
CO2: 24 mEq/L (ref 19–32)
Calcium: 9.4 mg/dL (ref 8.4–10.5)
Creatinine, Ser: 0.77 mg/dL (ref 0.50–1.35)
GFR calc Af Amer: >90 mL/min (ref >90)
GLUCOSE: 173 mg/dL — AB (ref 70–99)
POTASSIUM: 3.7 meq/L (ref 3.7–5.3)
SODIUM: 139 meq/L (ref 137–147)
Total Bilirubin: 2.2 mg/dL — ABNORMAL HIGH (ref 0.3–1.2)
Total Protein: 7.5 g/dL (ref 6.0–8.3)

## 2014-08-29 LAB — CBC WITH DIFFERENTIAL/PLATELET
BASOS PCT: 0 % (ref 0–1)
Basophils Absolute: 0 10*3/uL (ref 0.0–0.1)
EOS ABS: 0.1 10*3/uL (ref 0.0–0.7)
EOS PCT: 1 % (ref 0–5)
HCT: 39.3 % (ref 39.0–52.0)
Hemoglobin: 13 g/dL (ref 13.0–17.0)
Lymphocytes Relative: 13 % (ref 12–46)
Lymphs Abs: 2 10*3/uL (ref 0.7–4.0)
MCH: 30.4 pg (ref 26.0–34.0)
MCHC: 33.1 g/dL (ref 30.0–36.0)
MCV: 92 fL (ref 78.0–100.0)
Monocytes Absolute: 1.3 10*3/uL — ABNORMAL HIGH (ref 0.1–1.0)
Monocytes Relative: 8 % (ref 3–12)
NEUTROS PCT: 78 % — AB (ref 43–77)
Neutro Abs: 12.7 10*3/uL — ABNORMAL HIGH (ref 1.7–7.7)
PLATELETS: 167 10*3/uL (ref 150–400)
RBC: 4.27 MIL/uL (ref 4.22–5.81)
RDW: 13.4 % (ref 11.5–15.5)
WBC: 16.1 10*3/uL — ABNORMAL HIGH (ref 4.0–10.5)

## 2014-08-29 LAB — GLUCOSE, CAPILLARY
GLUCOSE-CAPILLARY: 158 mg/dL — AB (ref 70–99)
GLUCOSE-CAPILLARY: 191 mg/dL — AB (ref 70–99)
GLUCOSE-CAPILLARY: 189 mg/dL — AB (ref 70–99)
Glucose-Capillary: 261 mg/dL — ABNORMAL HIGH (ref 70–99)
Glucose-Capillary: 227 mg/dL — ABNORMAL HIGH (ref 70–99)

## 2014-08-29 MED ORDER — INSULIN ASPART 100 UNIT/ML ~~LOC~~ SOLN
22.0000 [IU] | Freq: Three times a day (TID) | SUBCUTANEOUS | Status: DC
  Administered 2014-08-29 (×2): 22 [IU] via SUBCUTANEOUS

## 2014-08-29 MED ORDER — METHYLPREDNISOLONE SODIUM SUCC 125 MG IJ SOLR
60.0000 mg | Freq: Once | INTRAMUSCULAR | Status: DC
  Filled 2014-08-29: qty 0.96

## 2014-08-29 MED ORDER — INSULIN ASPART PROT & ASPART (70-30 MIX) 100 UNIT/ML ~~LOC~~ SUSP
35.0000 [IU] | Freq: Two times a day (BID) | SUBCUTANEOUS | Status: DC
  Administered 2014-08-29 – 2014-08-31 (×4): 35 [IU] via SUBCUTANEOUS

## 2014-08-29 MED ORDER — IPRATROPIUM-ALBUTEROL 0.5-2.5 (3) MG/3ML IN SOLN
3.0000 mL | Freq: Four times a day (QID) | RESPIRATORY_TRACT | Status: DC
  Administered 2014-08-29 – 2014-08-31 (×9): 3 mL via RESPIRATORY_TRACT
  Filled 2014-08-29 (×9): qty 3

## 2014-08-29 NOTE — Progress Notes (Signed)
On-call provider notified of  chest x- ray result. No new order received at this time. Will cont to monitor.

## 2014-08-29 NOTE — Progress Notes (Signed)
Pt places his self on and off CPAP. He will call the RT if he needs help later.

## 2014-08-29 NOTE — Progress Notes (Signed)
[place] TEAM 1 - Stepdown/ICU TEAM Progress Note  Howard Mcmahon [id number] DOB: May 08, 1971 DOA: 08/26/2014 PCP: Lorayne Marek, MD  Admit HPI / Brief Narrative: Howard Mcmahon is a 43 y.o. BM PMHx diabetes type II uncontrolled, hypertension, hyperlipidemia, coronary artery disease, status post stent placement in the past who is a very poor historian. He was sleepy and groggy throughout the time that I was with him. Based on the ED provider notes, it appears that he presented with complaints of chest pain ongoing for one week. And then he also mentioned tingling in the left hand. When I spoke to him he denied any chest pain. He said the tingling has resolved. He was found to have elevated blood sugars and was started on an insulin infusion. He denies any nausea, vomiting or abdominal pain. He denies noncompliance with his medications. Denies any leg swelling. No generalized or focal weakness. He's unable to provide me any information regarding his symptom of chest pain that he presented with. So, overall a very poor historian.  HPI/Subjective: 12/ 20 A/O 4, overnight developed fever 39.4 c + leukocytosis, 12/19 PCXR showed pneumonia vs interstitial edema. Patient fits the definition of sepsis. States negative CP, negative SOB, negative productive cough.    Assessment/Plan: HCAP/sepsis -Overnight patient spiked a fever to 39.4 c + leukocytosis, 12/19 PCXR showed pneumonia vs interstitial edema -Will treat for HCAP -Blood cultures pending -Sputum culture, Legionella urine, strep pneumo urine, respiratory virus panel pending -Solu-Medrol 60 mg 1 (diabetes just coming under control) -DuoNeb QID -Flutter valve q4hr  -Continue normal saline at 137ml/hr   Hyperosmolar Hyperglycemic Nonketotic Syndrome (HHNS)  -12/17 hemoglobin A1c =13.6 -Increase insulin 70/30  to 35 units BID -Increase NovoLog 22 units QAC -Continue SSI to resistant  HTN -Within AHA/ADA guidelines -Continue lisinopril  40 mg BID  SVT episodic, asymptomatic -Continue metoprolol 12.5 mg BID  Chest pain -Resolved most likely secondary to HHNS -Continue antiplatelet agents. (stent placed about 3 years ago) -Troponin 2 negative -Echocardiogram nondiagnostic for CP see results below   Coronary artery disease -S/P stent in the past:  -Continue Plavix 75 mg daily -Continue aspirin 81 mg daily  HLD -Not within ADA guidelines, TG 1324. -Continue Lipitor 40 mg daily, will need to be titrated up by his PCP. -Patient most likely will require niacin in addition to Lipitor, but will hold off on starting multiple medications at the same time.  Leukocytosis -Mild leukocytosis, Stress Demargination? No clear source of infection therefore continue to hold on antibiotics. -Urine culture, blood culture pending -Urine Legionella, urine strep, influenza panel pending  Left hand tingling:  -Resolved Unclear etiology.  -MRI brain/C-spine nondiagnostic for cause.  Noncompliance with medication -Counseled patient at length concerning the sequela of not controlling his diabetes to include death      Code Status: FULL Family Communication: Family present at time of exam Disposition Plan: Control of diabetes    Consultants: NA   Procedure/Significant Events: 12/17 echocardiogram;Left ventricle: Systolic function wasnormal. -LVEF= 55%- 65%.  12/17 carotid Doppler; 1-39% ICA stenosis. Vertebral artery flow is antegrade.  12/17 CT head without contrast;No acute intracranial findings. 12/17 MRI brain and C-spine;- Motion degraded head MRI without evidence of acute intracranial abnormality. 12/19 PCXR;Vascular congestion, with increased interstitial markings,. Pneumonia?  Culture Blood culture pending Sputum culture pending  Antibiotics: Zosyn 12/20>>  Vancomycin 12/20>>   DVT prophylaxis:  Subcutaneous heparin   Devices NA   LINES / TUBES:      Continuous Infusions: . sodium chloride  50 mL/hr at 08/28/14 1816    Objective: VITAL SIGNS: Temp: 98.8 F (37.1 C) (12/20 0754) Temp Source: Oral (12/20 0754) BP: 124/82 mmHg (12/20 0754) Pulse Rate: 104 (12/20 0754) SPO2; FIO2:   Intake/Output Summary (Last 24 hours) at 08/29/14 0845 Last data filed at 08/29/14 [id number]  Gross per 24 hour  Intake 1517.5 ml  Output   1945 ml  Net -427.5 ml     Exam: General: A/O 4, NAD, No acute respiratory distress Lungs: Bibasilar crackles, negative wheezing  Cardiovascular: Tachycardic, Regular rhythm without murmur gallop or rub normal S1 and S2 Abdomen: Morbidly obese, Nontender, nondistended, soft, bowel sounds positive, no rebound, no ascites, no appreciable mass Extremities: No significant cyanosis, clubbing, or edema bilateral lower extremities  Data Reviewed: Basic Metabolic Panel:  Recent Labs Lab 08/27/14 0327 08/27/14 1835 08/28/14 0728 08/28/14 1257 08/29/14 0423  NA 138 135* 134* 136* 139  K 3.8 4.4 3.8 3.6* 3.7  CL 103 100 100 101 102  CO2 23 22 21 22 24   GLUCOSE 181* 226* 223* 219* 173*  BUN 11 9 9 9 10   CREATININE 0.59 0.58 0.60 0.60 0.77  CALCIUM 9.0 9.2 9.0 9.1 9.4  MG  --  1.7 1.6 1.7  --    Liver Function Tests:  Recent Labs Lab 08/27/14 0327 08/27/14 1835 08/28/14 0728 08/29/14 0423  AST 23 30 18 17   ALT 20 26 16 15   ALKPHOS 102 111 105 113  BILITOT 0.6 0.9 1.4* 2.2*  PROT 6.9 7.6 7.2 7.5  ALBUMIN 2.8* 3.0* 2.6* 2.7*   No results for input(s): LIPASE, AMYLASE in the last 168 hours. No results for input(s): AMMONIA in the last 168 hours. CBC:  Recent Labs Lab 08/26/14 0250 08/26/14 0635 08/27/14 0327 08/28/14 0728 08/29/14 0423  WBC 12.9*  --  12.7* 12.2* 16.1*  NEUTROABS  --   --   --  9.2* 12.7*  HGB 15.6 16.0 13.8 13.1 13.0  HCT 42.8 47.0 40.0 38.3* 39.3  MCV 92.0  --  90.7 90.8 92.0  PLT 188  --  152 154 167   Cardiac Enzymes:  Recent Labs Lab 08/26/14 1142 08/26/14 1927 08/27/14 1920 08/28/14 0017  08/28/14 0728  TROPONINI <0.30 <0.30 <0.30 <0.30 <0.30   BNP (last 3 results)  Recent Labs  01/11/14 0445 08/27/14 1920  PROBNP 633.1* 148.5*   CBG:  Recent Labs Lab 08/28/14 1630 08/28/14 2053 08/28/14 2348 08/29/14 0341 08/29/14 0754  GLUCAP 193* 171* 124* 158* 191*    Recent Results (from the past 240 hour(s))  MRSA PCR Screening     Status: None   Collection Time: 08/26/14  8:32 AM  Result Value Ref Range Status   MRSA by PCR NEGATIVE NEGATIVE Final    Comment:        The GeneXpert MRSA Assay (FDA approved for NASAL specimens only), is one component of a comprehensive MRSA colonization surveillance program. It is not intended to diagnose MRSA infection nor to guide or monitor treatment for MRSA infections.      Studies:  Recent x-ray studies have been reviewed in detail by the Attending Physician  Scheduled Meds:  Scheduled Meds: . aspirin EC  81 mg Oral Daily  . atorvastatin  40 mg Oral q1800  . clopidogrel  75 mg Oral Q breakfast  . heparin  5,000 Units Subcutaneous 3 times per day  . insulin aspart  0-20 Units Subcutaneous 6 times per day  . insulin aspart  20 Units Subcutaneous TID  WC  . insulin aspart protamine- aspart  32 Units Subcutaneous BID WC  . lisinopril  40 mg Oral BID  . LORazepam  0.5 mg Intravenous Once  . metoprolol tartrate  12.5 mg Oral BID  . nicotine  14 mg Transdermal Daily  . piperacillin-tazobactam (ZOSYN)  IV  3.375 g Intravenous 3 times per day  . vancomycin  1,250 mg Intravenous Q8H    Time spent on care of this patient: 40 mins   Allie Bossier , MD   [facility] Office  [phone] Pager [phone]  On-Call/Text Page:      [email or web address]      password [email or web address]  If 7PM-7AM, please contact night-coverage [email or web address] Password [email or web address] 08/29/2014, 8:45 AM   LOS: 3 days

## 2014-08-30 LAB — CBC WITH DIFFERENTIAL/PLATELET
BASOS PCT: 0 % (ref 0–1)
Basophils Absolute: 0 10*3/uL (ref 0.0–0.1)
EOS PCT: 2 % (ref 0–5)
Eosinophils Absolute: 0.3 10*3/uL (ref 0.0–0.7)
HEMATOCRIT: 33.1 % — AB (ref 39.0–52.0)
Hemoglobin: 11 g/dL — ABNORMAL LOW (ref 13.0–17.0)
Lymphocytes Relative: 14 % (ref 12–46)
Lymphs Abs: 1.8 10*3/uL (ref 0.7–4.0)
MCH: 30.6 pg (ref 26.0–34.0)
MCHC: 33.2 g/dL (ref 30.0–36.0)
MCV: 92.2 fL (ref 78.0–100.0)
MONO ABS: 0.9 10*3/uL (ref 0.1–1.0)
Monocytes Relative: 7 % (ref 3–12)
NEUTROS ABS: 10.2 10*3/uL — AB (ref 1.7–7.7)
Neutrophils Relative %: 77 % (ref 43–77)
PLATELETS: 178 10*3/uL (ref 150–400)
RBC: 3.59 MIL/uL — ABNORMAL LOW (ref 4.22–5.81)
RDW: 13.3 % (ref 11.5–15.5)
WBC: 13.2 10*3/uL — ABNORMAL HIGH (ref 4.0–10.5)

## 2014-08-30 LAB — COMPREHENSIVE METABOLIC PANEL
ALBUMIN: 2.4 g/dL — AB (ref 3.5–5.2)
ALK PHOS: 99 U/L (ref 39–117)
ALT: 13 U/L (ref 0–53)
AST: 13 U/L (ref 0–37)
Anion gap: 15 (ref 5–15)
BILIRUBIN TOTAL: 1.4 mg/dL — AB (ref 0.3–1.2)
BUN: 11 mg/dL (ref 6–23)
CHLORIDE: 105 meq/L (ref 96–112)
CO2: 21 meq/L (ref 19–32)
CREATININE: 0.7 mg/dL (ref 0.50–1.35)
Calcium: 8.9 mg/dL (ref 8.4–10.5)
GFR calc Af Amer: >90 mL/min (ref >90)
GFR calc non Af Amer: >90 mL/min (ref >90)
GLUCOSE: 136 mg/dL — AB (ref 70–99)
POTASSIUM: 3.4 meq/L — AB (ref 3.7–5.3)
Sodium: 141 mEq/L (ref 137–147)
Total Protein: 6.8 g/dL (ref 6.0–8.3)

## 2014-08-30 LAB — GLUCOSE, CAPILLARY
GLUCOSE-CAPILLARY: 148 mg/dL — AB (ref 70–99)
Glucose-Capillary: 108 mg/dL — ABNORMAL HIGH (ref 70–99)
Glucose-Capillary: 147 mg/dL — ABNORMAL HIGH (ref 70–99)
Glucose-Capillary: 159 mg/dL — ABNORMAL HIGH (ref 70–99)
Glucose-Capillary: 187 mg/dL — ABNORMAL HIGH (ref 70–99)
Glucose-Capillary: 190 mg/dL — ABNORMAL HIGH (ref 70–99)

## 2014-08-30 LAB — LACTIC ACID, PLASMA: LACTIC ACID, VENOUS: 0.7 mmol/L (ref 0.5–2.2)

## 2014-08-30 LAB — STREP PNEUMONIAE URINARY ANTIGEN: STREP PNEUMO URINARY ANTIGEN: NEGATIVE

## 2014-08-30 MED ORDER — POTASSIUM CHLORIDE CRYS ER 20 MEQ PO TBCR
40.0000 meq | EXTENDED_RELEASE_TABLET | Freq: Two times a day (BID) | ORAL | Status: DC
  Administered 2014-08-30 – 2014-08-31 (×2): 40 meq via ORAL
  Filled 2014-08-30 (×3): qty 2

## 2014-08-30 MED ORDER — INSULIN ASPART 100 UNIT/ML ~~LOC~~ SOLN
8.0000 [IU] | Freq: Once | SUBCUTANEOUS | Status: AC
  Administered 2014-08-30: 8 [IU] via SUBCUTANEOUS

## 2014-08-30 MED ORDER — INSULIN ASPART 100 UNIT/ML ~~LOC~~ SOLN
8.0000 [IU] | Freq: Three times a day (TID) | SUBCUTANEOUS | Status: DC
  Administered 2014-08-30 – 2014-08-31 (×4): 8 [IU] via SUBCUTANEOUS

## 2014-08-30 MED ORDER — INSULIN ASPART 100 UNIT/ML ~~LOC~~ SOLN
0.0000 [IU] | Freq: Three times a day (TID) | SUBCUTANEOUS | Status: DC
  Administered 2014-08-30 – 2014-08-31 (×3): 4 [IU] via SUBCUTANEOUS
  Administered 2014-08-31: 11 [IU] via SUBCUTANEOUS

## 2014-08-30 NOTE — Progress Notes (Signed)
[place] TEAM 1 - Stepdown/ICU TEAM Progress Note  Howard Mcmahon [id number] DOB: 1971-02-09 DOA: 08/26/2014 PCP: Lorayne Marek, MD  Admit HPI / Brief Narrative: 43 yo M Hx diabetes type II uncontrolled, hypertension, hyperlipidemia, coronary artery disease status post stent placement in the past who presented with complaints of chest pain ongoing for one week and tingling in the left hand. He was found to have elevated blood sugars and was started on an insulin infusion. He denied nausea, vomiting or abdominal pain.  HPI/Subjective: Pt has no new complaints today.  He denies subjective fever, photophobia, neck stiffness, joint aches, abdom pain, diarrhea, n/v, or cp.    Assessment/Plan:  FUO - Possible HCAP? Sputum culture, Legionella urine, and respiratory virus panel pending - influenza negative, strep pneumo negative - pt had fever to 103 12/19, and temp elevation to 100.4 this morning - since source is NOT clear I feel that d/c today would be irresponsible - narrow abx to zosyn alone (MRSA negative) and follow over night - f/u CXR in AM 12/22 - give cultures longer to "cook"  Hyperosmolar Hyperglycemic Nonketotic Syndrome (HHNS) / early DKA  A1c 13.6 - CBG now better controlled - gently adjust tx and follow trend   HTN BP currently well controlled - follow   SVT episodic, asymptomatic Continue metoprolol 12.5 mg BID - likely related to fever  Chest pain w/ hx CAD Resolved - Continue antiplatelet agents (stent placed about 3 years ago) - Troponin 3 negative - Echocardiogram w/o WMA - no further inpt w/u planned   HLD TG 1324 - Continue Lipitor 40 mg daily - Patient most likely will require niacin in addition to Lipitor, but will hold off on starting multiple medications at the same time  Left hand tingling:  Resolved - Unclear etiology - MRI brain/C-spine nondiagnostic   Noncompliance with medication Counseled patient at length concerning the sequela of not controlling  his diabetes to include death  Obesity - Body mass index is 32.78 kg/(m^2).  Code Status: FULL Family Communication: no family present at time of exam Disposition Plan: monitor FUO over night - if no further fever consider d/c home 12/22  Consultants: NA  Procedure/Significant Events: 12/17 echocardiogram;Left ventricle: Systolic function wasnormal. -LVEF= 55%- 65%.  12/17 carotid Doppler; 1-39% ICA stenosis. Vertebral artery flow is antegrade.  12/17 CT head without contrast;No acute intracranial findings. 12/17 MRI brain and C-spine;- Motion degraded head MRI without evidence of acute intracranial abnormality. 12/19 PCXR;Vascular congestion, with increased interstitial markings,. Pneumonia?  Antibiotics: Zosyn 12/19 >  Vancomycin 12/19 > 12/21  DVT prophylaxis:  Subcutaneous heparin   Objective: Blood pressure 127/62, pulse 109, temperature 98.6 F (37 C), temperature source Oral, resp. rate 24, height 6\' 4"  (1.93 m), weight 122.108 kg (269 lb 3.2 oz), SpO2 96 %.  Intake/Output Summary (Last 24 hours) at 08/30/14 1305 Last data filed at 08/30/14 [id number]  Gross per 24 hour  Intake   3878 ml  Output   1625 ml  Net   2253 ml   Exam: General: A/O 4, No acute respiratory distress Lungs: CTA th/o w/o wheeze or crackles appreciable  Cardiovascular: Regular rate and rhythm without murmur gallop or rub normal S1 and S2 Abdomen: Obese, nontender, nondistended, soft, bowel sounds positive, no rebound, no ascites, no appreciable mass Extremities: No significant cyanosis, clubbing, or edema bilateral lower extremities  Data Reviewed: Basic Metabolic Panel:  Recent Labs Lab 08/27/14 1835 08/28/14 0728 08/28/14 1257 08/29/14 0423 08/30/14 0644  NA 135* 134* 136*  139 141  K 4.4 3.8 3.6* 3.7 3.4*  CL 100 100 101 102 105  CO2 22 21 22 24 21   GLUCOSE 226* 223* 219* 173* 136*  BUN 9 9 9 10 11   CREATININE 0.58 0.60 0.60 0.77 0.70  CALCIUM 9.2 9.0 9.1 9.4 8.9  MG 1.7 1.6  1.7  --   --    Liver Function Tests:  Recent Labs Lab 08/27/14 0327 08/27/14 1835 08/28/14 0728 08/29/14 0423 08/30/14 0644  AST 23 30 18 17 13   ALT 20 26 16 15 13   ALKPHOS 102 111 105 113 99  BILITOT 0.6 0.9 1.4* 2.2* 1.4*  PROT 6.9 7.6 7.2 7.5 6.8  ALBUMIN 2.8* 3.0* 2.6* 2.7* 2.4*   CBC:  Recent Labs Lab 08/26/14 0250 08/26/14 0635 08/27/14 0327 08/28/14 0728 08/29/14 0423 08/30/14 0644  WBC 12.9*  --  12.7* 12.2* 16.1* 13.2*  NEUTROABS  --   --   --  9.2* 12.7* 10.2*  HGB 15.6 16.0 13.8 13.1 13.0 11.0*  HCT 42.8 47.0 40.0 38.3* 39.3 33.1*  MCV 92.0  --  90.7 90.8 92.0 92.2  PLT 188  --  152 154 167 178   Cardiac Enzymes:  Recent Labs Lab 08/26/14 1142 08/26/14 1927 08/27/14 1920 08/28/14 0017 08/28/14 0728  TROPONINI <0.30 <0.30 <0.30 <0.30 <0.30   BNP (last 3 results)  Recent Labs  01/11/14 0445 08/27/14 1920  PROBNP 633.1* 148.5*   CBG:  Recent Labs Lab 08/29/14 2014 08/30/14 0007 08/30/14 0409 08/30/14 0751 08/30/14 1146  GLUCAP 227* 147* 108* 159* 187*    Recent Results (from the past 240 hour(s))  MRSA PCR Screening     Status: None   Collection Time: 08/26/14  8:32 AM  Result Value Ref Range Status   MRSA by PCR NEGATIVE NEGATIVE Final    Comment:        The GeneXpert MRSA Assay (FDA approved for NASAL specimens only), is one component of a comprehensive MRSA colonization surveillance program. It is not intended to diagnose MRSA infection nor to guide or monitor treatment for MRSA infections.   Culture, blood (routine x 2)     Status: None (Preliminary result)   Collection Time: 08/27/14  7:20 PM  Result Value Ref Range Status   Specimen Description BLOOD LEFT HAND  Final   Special Requests BOTTLES DRAWN AEROBIC AND ANAEROBIC 10CC  Final   Culture  Setup Time   Final    08/28/2014 00:49 Performed at [facility]    Culture   Final           BLOOD CULTURE RECEIVED NO GROWTH TO DATE CULTURE WILL BE HELD FOR 5  DAYS BEFORE ISSUING A FINAL NEGATIVE REPORT Performed at [facility]    Report Status PENDING  Incomplete  Culture, blood (routine x 2)     Status: None (Preliminary result)   Collection Time: 08/27/14  7:30 PM  Result Value Ref Range Status   Specimen Description BLOOD LEFT ARM  Final   Special Requests BOTTLES DRAWN AEROBIC AND ANAEROBIC 10CC  Final   Culture  Setup Time   Final    08/28/2014 00:49 Performed at [facility]    Culture   Final           BLOOD CULTURE RECEIVED NO GROWTH TO DATE CULTURE WILL BE HELD FOR 5 DAYS BEFORE ISSUING A FINAL NEGATIVE REPORT Performed at [facility]    Report Status PENDING  Incomplete  Culture,  Urine     Status: None   Collection Time: 08/27/14  9:42 PM  Result Value Ref Range Status   Specimen Description URINE, CLEAN CATCH  Final   Special Requests NONE  Final   Culture  Setup Time   Final    08/28/2014 01:44 Performed at [place]   Final    70,000 COLONIES/ML Performed at [facility]    Culture   Final    Multiple bacterial morphotypes present, none predominant. Suggest appropriate recollection if clinically indicated. Performed at [facility]    Report Status 08/29/2014 FINAL  Final  Culture, blood (single)     Status: None (Preliminary result)   Collection Time: 08/28/14 10:25 PM  Result Value Ref Range Status   Specimen Description BLOOD LEFT HAND  Final   Special Requests BOTTLES DRAWN AEROBIC AND ANAEROBIC 10CC EA  Final   Culture  Setup Time   Final    08/29/2014 04:13 Performed at [facility]    Culture   Final           BLOOD CULTURE RECEIVED NO GROWTH TO DATE CULTURE WILL BE HELD FOR 5 DAYS BEFORE ISSUING A FINAL NEGATIVE REPORT Performed at [facility]    Report Status PENDING  Incomplete  Culture, blood (routine x 2)     Status: None (Preliminary result)   Collection Time: 08/29/14  9:41 AM  Result Value Ref Range Status     Specimen Description BLOOD RIGHT ARM  Final   Special Requests BOTTLES DRAWN AEROBIC ONLY 3 CC  Final   Culture  Setup Time   Final    08/29/2014 18:30 Performed at [facility]    Culture   Final           BLOOD CULTURE RECEIVED NO GROWTH TO DATE CULTURE WILL BE HELD FOR 5 DAYS BEFORE ISSUING A FINAL NEGATIVE REPORT Performed at [facility]    Report Status PENDING  Incomplete  Culture, blood (routine x 2)     Status: None (Preliminary result)   Collection Time: 08/29/14  9:46 AM  Result Value Ref Range Status   Specimen Description BLOOD RIGHT HAND  Final   Special Requests BOTTLES DRAWN AEROBIC AND ANAEROBIC 5 CC  Final   Culture  Setup Time   Final    08/29/2014 18:31 Performed at [facility]    Culture   Final           BLOOD CULTURE RECEIVED NO GROWTH TO DATE CULTURE WILL BE HELD FOR 5 DAYS BEFORE ISSUING A FINAL NEGATIVE REPORT Performed at [facility]    Report Status PENDING  Incomplete     Studies:  Recent x-ray studies have been reviewed in detail by the Attending Physician  Scheduled Meds:  Scheduled Meds: . aspirin EC  81 mg Oral Daily  . atorvastatin  40 mg Oral q1800  . clopidogrel  75 mg Oral Q breakfast  . heparin  5,000 Units Subcutaneous 3 times per day  . insulin aspart  0-20 Units Subcutaneous TID WC  . insulin aspart  8 Units Subcutaneous TID WC  . insulin aspart protamine- aspart  35 Units Subcutaneous BID WC  . ipratropium-albuterol  3 mL Nebulization QID  . lisinopril  40 mg Oral BID  . LORazepam  0.5 mg Intravenous Once  . methylPREDNISolone (SOLU-MEDROL) injection  60 mg Intravenous Once  . metoprolol tartrate  12.5 mg Oral BID  .  nicotine  14 mg Transdermal Daily  . piperacillin-tazobactam (ZOSYN)  IV  3.375 g Intravenous 3 times per day  . vancomycin  1,250 mg Intravenous Q8H    Time spent on care of this patient: 35 mins  Cherene Altes, MD [facility] For Consults/Admissions - Flow  Manager - [phone] Office  [phone] Pager [phone]  On-Call/Text Page:      [email or web address]      password [facility]  08/30/2014, 1:05 PM   LOS: 4 days

## 2014-08-30 NOTE — Progress Notes (Signed)
Inpatient Diabetes Program Recommendations  AACE/ADA: New Consensus Statement on Inpatient Glycemic Control (2013)  Target Ranges:  Prepandial:   less than 140 mg/dL      Peak postprandial:   less than 180 mg/dL (1-2 hours)      Critically ill patients:  140 - 180 mg/dL     Results for Howard Mcmahon, Howard Mcmahon (MRN [id number]) as of 08/30/2014 09:13  Ref. Range 08/28/2014 23:48 08/29/2014 03:41 08/29/2014 07:54 08/29/2014 11:55 08/29/2014 16:12 08/29/2014 20:14  Glucose-Capillary Latest Range: 70-99 mg/dL 124 (H) 158 (H) 191 (H) 189 (H) 261 (H) 227 (H)     Current Insulin Orders: 70/30 insulin- 35 units bidwc      Novolog 8 units tidwc      Novolog Resistant SSI tidwc   **Note patient received a total of 82 units extra Novolog yesterday (12/20).    **Note 70/30 insulin increased to 35 units bidwc last evening.   MD- Extra Novolog Meal Coverage insulin usually not recommended to be given with 70/30 insulin since 70/30 insulin already has a built-in meal coverage dose embedded within it.  Recommend the following:  1. Stop Novolog 8 units tidwc 2. Adjust 70/30 insulin as needed based on the amount of SSI that patient requires throughout the day     Will follow [name] RN, MSN, CDE Diabetes Coordinator Inpatient Diabetes Program Team Pager: [phone] (8a-10p)

## 2014-08-30 NOTE — Progress Notes (Signed)
Patient admitted on 08/26/14 and receiving ongoing treatment for HCAP/sepsis, hyperosmolar hyperglycemic nonketotic syndrome, HTN, chest pain, leukocytosis, coronary artery disease. Patient has past medical history of uncontrolled diabetes mellitus type II, hypertension, hyperlipidemia, coronary artery disease-s/p past stent placement. Patient will need to follow-up with PCP Dr. Annitta Needs after discharge.  Hospital follow-up appointment obtained for patient-09/07/14 at 1615. Tomi Bamberger, RN Case Manager notified of appointment. Appointment added to AVS (Patient Instructions).

## 2014-08-30 NOTE — Patient Instructions (Signed)
Hospital Follow-Up Appointment: 09/07/14 at 4:15pm with Dr. Annitta Needs  Location: [facility] [address] [place], [place] [address]

## 2014-08-30 NOTE — Progress Notes (Addendum)
Clarified insulin orders with MD Thereasa Solo. Orders received. Give 8 units of Novolog meal coverage in addition to sliding scale coverage. Give Novolog 70/30 mix as ordered. Will continue to monitor

## 2014-08-31 ENCOUNTER — Inpatient Hospital Stay (HOSPITAL_COMMUNITY): Payer: MEDICAID

## 2014-08-31 DIAGNOSIS — I25111 Atherosclerotic heart disease of native coronary artery with angina pectoris with documented spasm: Secondary | ICD-10-CM

## 2014-08-31 DIAGNOSIS — R200 Anesthesia of skin: Secondary | ICD-10-CM | POA: Insufficient documentation

## 2014-08-31 LAB — COMPREHENSIVE METABOLIC PANEL
ALT: 19 U/L (ref 0–53)
AST: 17 U/L (ref 0–37)
Albumin: 2.5 g/dL — ABNORMAL LOW (ref 3.5–5.2)
Alkaline Phosphatase: 85 U/L (ref 39–117)
Anion gap: 7 (ref 5–15)
BUN: 8 mg/dL (ref 6–23)
CALCIUM: 8.9 mg/dL (ref 8.4–10.5)
CHLORIDE: 111 meq/L (ref 96–112)
CO2: 22 mmol/L (ref 19–32)
CREATININE: 0.81 mg/dL (ref 0.50–1.35)
GLUCOSE: 195 mg/dL — AB (ref 70–99)
Potassium: 3.9 mmol/L (ref 3.5–5.1)
Sodium: 140 mmol/L (ref 135–145)
Total Bilirubin: 1.3 mg/dL — ABNORMAL HIGH (ref 0.3–1.2)
Total Protein: 6.8 g/dL (ref 6.0–8.3)

## 2014-08-31 LAB — LEGIONELLA ANTIGEN, URINE

## 2014-08-31 LAB — CBC WITH DIFFERENTIAL/PLATELET
BASOS ABS: 0 10*3/uL (ref 0.0–0.1)
Basophils Relative: 0 % (ref 0–1)
EOS ABS: 0.3 10*3/uL (ref 0.0–0.7)
EOS PCT: 3 % (ref 0–5)
HCT: 33.4 % — ABNORMAL LOW (ref 39.0–52.0)
Hemoglobin: 10.8 g/dL — ABNORMAL LOW (ref 13.0–17.0)
Lymphocytes Relative: 23 % (ref 12–46)
Lymphs Abs: 1.9 10*3/uL (ref 0.7–4.0)
MCH: 30.4 pg (ref 26.0–34.0)
MCHC: 32.3 g/dL (ref 30.0–36.0)
MCV: 94.1 fL (ref 78.0–100.0)
Monocytes Absolute: 0.7 10*3/uL (ref 0.1–1.0)
Monocytes Relative: 8 % (ref 3–12)
Neutro Abs: 5.6 10*3/uL (ref 1.7–7.7)
Neutrophils Relative %: 66 % (ref 43–77)
PLATELETS: 204 10*3/uL (ref 150–400)
RBC: 3.55 MIL/uL — ABNORMAL LOW (ref 4.22–5.81)
RDW: 13.3 % (ref 11.5–15.5)
WBC: 8.4 10*3/uL (ref 4.0–10.5)

## 2014-08-31 LAB — RESPIRATORY VIRUS PANEL
Adenovirus: NOT DETECTED
Influenza A H1: NOT DETECTED
Influenza A H3: NOT DETECTED
Influenza A: NOT DETECTED
Influenza B: NOT DETECTED
Metapneumovirus: NOT DETECTED
PARAINFLUENZA 2 A: NOT DETECTED
Parainfluenza 1: NOT DETECTED
Parainfluenza 3: NOT DETECTED
RESPIRATORY SYNCYTIAL VIRUS B: NOT DETECTED
RHINOVIRUS: NOT DETECTED
Respiratory Syncytial Virus A: NOT DETECTED

## 2014-08-31 LAB — GLUCOSE, CAPILLARY
Glucose-Capillary: 189 mg/dL — ABNORMAL HIGH (ref 70–99)
Glucose-Capillary: 272 mg/dL — ABNORMAL HIGH (ref 70–99)

## 2014-08-31 MED ORDER — "NEEDLE (DISP) 25G X 1"" MISC": 32.0000 [IU] | Freq: Two times a day (BID) | Status: DC

## 2014-08-31 MED ORDER — CEFUROXIME AXETIL 500 MG PO TABS
500.0000 mg | ORAL_TABLET | Freq: Two times a day (BID) | ORAL | Status: DC
  Filled 2014-08-31 (×2): qty 1

## 2014-08-31 MED ORDER — NICOTINE 14 MG/24HR TD PT24: 14.0000 mg | MEDICATED_PATCH | Freq: Every day | TRANSDERMAL | Status: DC

## 2014-08-31 MED ORDER — INSULIN ASPART PROT & ASPART (70-30 MIX) 100 UNIT/ML ~~LOC~~ SUSP: 35.0000 [IU] | Freq: Two times a day (BID) | SUBCUTANEOUS | Status: DC

## 2014-08-31 MED ORDER — INSULIN ASPART 100 UNIT/ML ~~LOC~~ SOLN: 8.0000 [IU] | Freq: Three times a day (TID) | SUBCUTANEOUS | Status: DC

## 2014-08-31 MED ORDER — ATORVASTATIN CALCIUM 40 MG PO TABS: 40.0000 mg | ORAL_TABLET | Freq: Every day | ORAL | Status: DC

## 2014-08-31 MED ORDER — METOPROLOL TARTRATE 12.5 MG HALF TABLET: 12.5000 mg | ORAL_TABLET | Freq: Two times a day (BID) | ORAL | Status: DC

## 2014-08-31 MED ORDER — CEFUROXIME AXETIL 500 MG PO TABS: 500.0000 mg | ORAL_TABLET | Freq: Two times a day (BID) | ORAL | Status: DC

## 2014-08-31 NOTE — Progress Notes (Signed)
NURSING PROGRESS NOTE  [name] [id number] Discharge Data: 08/31/2014 4:26 PM Attending Provider: No att. providers found [id number], Vernon Prey, MD     [name] to be D/C'd Home per MD order.  Discussed with the patient the After Visit Summary and all questions fully answered. All IV's discontinued with no bleeding noted. All belongings returned to patient for patient to take home.   Last Vital Signs:  Blood pressure 114/80, pulse 81, temperature 99.1 F (37.3 C), temperature source Oral, resp. rate 20, height 6\' 4"  (1.93 m), weight 122.108 kg (269 lb 3.2 oz), SpO2 100 %.  Discharge Medication List   Medication List    STOP taking these medications        ciprofloxacin 500 MG tablet  Commonly known as:  CIPRO     insulin NPH-regular Human (70-30) 100 UNIT/ML injection  Commonly known as:  NOVOLIN 70/30      TAKE these medications        aspirin EC 81 MG tablet  Take 81 mg by mouth daily.     atorvastatin 40 MG tablet  Commonly known as:  LIPITOR  Take 1 tablet (40 mg total) by mouth daily at 6 PM.     cefUROXime 500 MG tablet  Commonly known as:  CEFTIN  Take 1 tablet (500 mg total) by mouth 2 (two) times daily with a meal.     clopidogrel 75 MG tablet  Commonly known as:  PLAVIX  Take 75 mg by mouth daily with breakfast.     insulin aspart 100 UNIT/ML injection  Commonly known as:  novoLOG  Inject 8 Units into the skin 3 (three) times daily with meals.     insulin aspart protamine- aspart (70-30) 100 UNIT/ML injection  Commonly known as:  NOVOLOG MIX 70/30  Inject 0.35 mLs (35 Units total) into the skin 2 (two) times daily with a meal.     lisinopril 20 MG tablet  Commonly known as:  PRINIVIL,ZESTRIL  Take 40 mg by mouth 2 (two) times daily.     metoprolol tartrate 12.5 mg Tabs tablet  Commonly known as:  LOPRESSOR  Take 0.5 tablets (12.5 mg total) by mouth 2 (two) times daily.     NEEDLE (DISP) 25 G 25G X 1" Misc  32 Units by Does not apply route 2  (two) times daily.     nicotine 14 mg/24hr patch  Commonly known as:  NICODERM CQ - dosed in mg/24 hours  Place 1 patch (14 mg total) onto the skin daily.     oxyCODONE 5 MG immediate release tablet  Commonly known as:  Oxy IR/ROXICODONE  Take 1 tablet (5 mg total) by mouth every 4 (four) hours as needed for moderate pain.     senna-docusate 8.6-50 MG per tablet  Commonly known as:  Senokot-S  Take 1 tablet by mouth 2 (two) times daily.         Wallie Renshaw, RN

## 2014-09-03 LAB — CULTURE, BLOOD (ROUTINE X 2)
CULTURE: NO GROWTH
Culture: NO GROWTH

## 2014-09-04 LAB — CULTURE, BLOOD (ROUTINE X 2)
Culture: NO GROWTH
Culture: NO GROWTH

## 2014-09-04 LAB — CULTURE, BLOOD (SINGLE): CULTURE: NO GROWTH

## 2014-09-07 ENCOUNTER — Inpatient Hospital Stay: Payer: Self-pay | Admitting: Internal Medicine

## 2018-12-28 NOTE — Congregational Nurse Program (Signed)
Patient has a history of heart failure he will need a referral place to Case manager to followup.

## 2018-12-29 ENCOUNTER — Telehealth: Payer: Self-pay | Admitting: Pediatric Intensive Care

## 2018-12-29 ENCOUNTER — Telehealth: Payer: Self-pay

## 2018-12-29 NOTE — Telephone Encounter (Signed)
Verified ID x 2 identifiers. Client states history of CAD with bypass at [facility] in June. He has been connected to cardiology services at [facility]. He states history of DM2 for 4-5years and has been on metformin 1000mg  bid and insulin 70/30 unspecified dosage. He is currently out of insulin. He monitors his BGs 2 times per day. He is also taking Plavix and lisinopril. Client requests to call CN back when he has access to his medications in his room. CN advised client to call back in 20 minutes. Lisette Abu RN BSN CNP [phone]

## 2018-12-29 NOTE — Telephone Encounter (Signed)
Call from client to reconcile medication. Client clarified that he has been without insulin for 3-4 weeks and his blood sugars have been in the 400s. He currently has a yeast infection. Client gives consent for CN to connect to PCP at [facility]. Call to Etter Sjogren for appointment. CN will message client to advise of appointment date and time. [place] [phone]

## 2018-12-29 NOTE — Telephone Encounter (Signed)
Call received from [place] Sissy Hoff requesting an appointment for patient at [facility]. An appointment was scheduled for 12/31/2018 @ 1050.

## 2018-12-31 ENCOUNTER — Telehealth: Payer: Self-pay

## 2018-12-31 ENCOUNTER — Ambulatory Visit: Payer: Self-pay | Admitting: Primary Care

## 2018-12-31 ENCOUNTER — Other Ambulatory Visit: Payer: Self-pay

## 2018-12-31 NOTE — Telephone Encounter (Signed)
Patient's appointment rescheduled to in person visit at request of PCP.  01/02/2019 @ 0850, fasting. Lisette Abu, RN/IRC to notify patient.

## 2019-01-02 ENCOUNTER — Other Ambulatory Visit: Payer: Self-pay

## 2019-01-02 ENCOUNTER — Ambulatory Visit: Payer: Self-pay | Attending: Primary Care | Admitting: Primary Care

## 2019-01-02 ENCOUNTER — Encounter: Payer: Self-pay | Admitting: Primary Care

## 2019-01-02 VITALS — BP 137/89 | HR 83 | Temp 98.5°F | Resp 16 | Ht 76.0 in | Wt 257.4 lb

## 2019-01-02 DIAGNOSIS — E1165 Type 2 diabetes mellitus with hyperglycemia: Secondary | ICD-10-CM

## 2019-01-02 DIAGNOSIS — E782 Mixed hyperlipidemia: Secondary | ICD-10-CM

## 2019-01-02 DIAGNOSIS — I10 Essential (primary) hypertension: Secondary | ICD-10-CM

## 2019-01-02 DIAGNOSIS — I2510 Atherosclerotic heart disease of native coronary artery without angina pectoris: Secondary | ICD-10-CM

## 2019-01-02 DIAGNOSIS — E0800 Diabetes mellitus due to underlying condition with hyperosmolarity without nonketotic hyperglycemic-hyperosmolar coma (NKHHC): Secondary | ICD-10-CM

## 2019-01-02 LAB — POCT URINALYSIS DIP (CLINITEK)
Bilirubin, UA: NEGATIVE
Glucose, UA: 500 mg/dL — AB
Ketones, POC UA: NEGATIVE mg/dL
Leukocytes, UA: NEGATIVE
Nitrite, UA: NEGATIVE
POC PROTEIN,UA: 30 — AB
Spec Grav, UA: 1.015 (ref 1.010–1.025)
Urobilinogen, UA: 1 E.U./dL
pH, UA: 5 (ref 5.0–8.0)

## 2019-01-02 LAB — GLUCOSE, POCT (MANUAL RESULT ENTRY)
POC Glucose: 409 mg/dl — AB (ref 70–99)
POC Glucose: 416 mg/dl — AB (ref 70–99)
POC Glucose: 364 mg/dl — AB (ref 70–99)

## 2019-01-02 LAB — POCT GLYCOSYLATED HEMOGLOBIN (HGB A1C)

## 2019-01-02 MED ORDER — INSULIN GLARGINE 100 UNIT/ML ~~LOC~~ SOLN: 42.0000 [IU] | Freq: Every day | SUBCUTANEOUS | 11 refills | Status: DC

## 2019-01-02 MED ORDER — TRUEPLUS LANCETS 28G MISC: 12 refills | Status: DC

## 2019-01-02 MED ORDER — "INSULIN SYRINGE-NEEDLE U-100 31G X 15/64"" 1 ML MISC": 12 refills | Status: DC

## 2019-01-02 MED ORDER — TRUE METRIX METER W/DEVICE KIT: PACK | 0 refills | Status: DC

## 2019-01-02 MED ORDER — GLUCOSE BLOOD VI STRP: ORAL_STRIP | 12 refills | Status: DC

## 2019-01-02 MED ORDER — INSULIN ASPART 100 UNIT/ML ~~LOC~~ SOLN
20.0000 [IU] | Freq: Once | SUBCUTANEOUS | Status: AC
  Administered 2019-01-02: 20 [IU] via SUBCUTANEOUS

## 2019-01-02 MED FILL — !LANTUS 100 UNITS/ML VIAL: 100 | 23 days supply | Qty: 10 | Fill #0

## 2019-01-02 NOTE — Patient Instructions (Signed)
Please schedule an in person visit with Howard Mcmahon in 2 weeks for DM

## 2019-01-02 NOTE — Progress Notes (Signed)
Patient checked for patency of IV and flow of NS.  IV patent and NS half done.

## 2019-01-02 NOTE — Progress Notes (Signed)
New Patient Office Visit  Subjective:  Patient ID: Howard Mcmahon, male    DOB: 1971/02/29  Age: 48 y.o. MRN: [id number]  CC: No chief complaint on file.    [name] presents for to establish care. He has a PMH of diabetes type II uncontrolled, hypertension, hyperlipidemia, coronary artery disease with stent placement. A1C > 15. Discussed with Clinical Pharmacists we will re-eval his insulin regiment make changes and return to clinic for a visit with clinical pharmacist. Present given 20 units of regular and 4 cups of water. Urine glucose 500, moderate blood .  Past Medical History:  Diagnosis Date  . Diabetes mellitus without complication ([place])   . Hypertension   . Macular infarction    2012  . MI (myocardial infarction) [facility])     Past Surgical History:  Procedure Laterality Date  . CORONARY ANGIOPLASTY WITH STENT PLACEMENT    . HIP ARTHROPLASTY Right     Family History  Problem Relation Age of Onset  . Stroke Mother     Social History   Socioeconomic History  . Marital status: Widowed    Spouse name: Not on file  . Number of children: Not on file  . Years of education: Not on file  . Highest education level: Not on file  Occupational History  . Not on file  Social Needs  . Financial resource strain: Not on file  . Food insecurity:    Worry: Not on file    Inability: Not on file  . Transportation needs:    Medical: Not on file    Non-medical: Not on file  Tobacco Use  . Smoking status: Current Every Day Smoker    Packs/day: 2.00    Years: 30.00    Pack years: 60.00    Types: Cigarettes  . Smokeless tobacco: Current User  Substance and Sexual Activity  . Alcohol use: No  . Drug use: No  . Sexual activity: Not on file  Lifestyle  . Physical activity:    Days per week: Not on file    Minutes per session: Not on file  . Stress: Not on file  Relationships  . Social connections:    Talks on phone: Not on file    Gets together: Not on file    Attends  religious service: Not on file    Active member of club or organization: Not on file    Attends meetings of clubs or organizations: Not on file    Relationship status: Not on file  . Intimate partner violence:    Fear of current or ex partner: Not on file    Emotionally abused: Not on file    Physically abused: Not on file    Forced sexual activity: Not on file  Other Topics Concern  . Not on file  Social History Narrative  . Not on file    ROS Review of Systems  Constitutional: Positive for malaise/fatigue.  HENT: Negative.   Eyes: Positive for blurred vision.  Respiratory: Negative.   Cardiovascular: Negative.   Gastrointestinal: Negative.   Endocrine: Positive for polydipsia, polyphagia and polyuria.  Genitourinary: Negative.   Musculoskeletal: Negative.   Skin: Negative.   Allergic/Immunologic: Negative.   Psychiatric/Behavioral: Positive for agitation. The patient is nervous/anxious.     Objective:   Today's Vitals: There were no vitals taken for this visit.  Physical Exam Constitutional:      Appearance: Normal appearance.  HENT:     Head: Normocephalic and atraumatic.  Right Ear: Tympanic membrane normal.     Left Ear: Tympanic membrane normal.     Mouth/Throat:     Mouth: Mucous membranes are dry.  Eyes:     Extraocular Movements: Extraocular movements intact.  Neck:     Musculoskeletal: Normal range of motion and neck supple.  Cardiovascular:     Rate and Rhythm: Normal rate and regular rhythm.     Pulses: Normal pulses.     Heart sounds: Normal heart sounds.  Pulmonary:     Effort: Pulmonary effort is normal.     Breath sounds: Normal breath sounds.  Abdominal:     General: Bowel sounds are normal.     Palpations: Abdomen is soft.  Skin:    General: Skin is warm and dry.  Neurological:     General: No focal deficit present.     Mental Status: He is alert and oriented to person, place, and time.  Psychiatric:        Mood and Affect: Mood  normal.        Behavior: Behavior normal.     Assessment & Plan:   Problem List Items Addressed This Visit    None      Outpatient Encounter Medications as of 01/02/2019  Medication Sig  . aspirin EC 81 MG tablet Take 81 mg by mouth daily.  Marland Kitchen atorvastatin (LIPITOR) 40 MG tablet Take 1 tablet (40 mg total) by mouth daily at 6 PM.  . clopidogrel (PLAVIX) 75 MG tablet Take 75 mg by mouth daily with breakfast.  . insulin aspart (NOVOLOG) 100 UNIT/ML injection Inject 8 Units into the skin 3 (three) times daily with meals.  . insulin aspart protamine- aspart (NOVOLOG MIX 70/30) (70-30) 100 UNIT/ML injection Inject 0.35 mLs (35 Units total) into the skin 2 (two) times daily with a meal.  . lisinopril (PRINIVIL,ZESTRIL) 20 MG tablet Take 40 mg by mouth 2 (two) times daily.   . metoprolol tartrate (LOPRESSOR) 12.5 mg TABS tablet Take 0.5 tablets (12.5 mg total) by mouth 2 (two) times daily.  Marland Kitchen NEEDLE, DISP, 25 G 25G X 1" MISC 32 Units by Does not apply route 2 (two) times daily. (Patient not taking: Reported on 12/28/2018)  . nicotine (NICODERM CQ - DOSED IN MG/24 HOURS) 14 mg/24hr patch Place 1 patch (14 mg total) onto the skin daily. (Patient not taking: Reported on 12/28/2018)  . oxyCODONE (OXY IR/ROXICODONE) 5 MG immediate release tablet Take 1 tablet (5 mg total) by mouth every 4 (four) hours as needed for moderate pain. (Patient not taking: Reported on 12/28/2018)  . senna-docusate (SENOKOT-S) 8.6-50 MG per tablet Take 1 tablet by mouth 2 (two) times daily. (Patient not taking: Reported on 12/28/2018)   No facility-administered encounter medications on file as of 01/02/2019.   Diabetes  He presents for his initial diabetic visit. He has type 2 diabetes mellitus. No MedicAlert identification noted. His disease course has been worsening. Hypoglycemia symptoms include hunger and nervousness/anxiousness. Associated symptoms include blurred vision, polydipsia, polyphagia and polyuria. There are no  hypoglycemic complications. Symptoms are worsening. He is compliant with treatment some of the time. His weight is stable. When asked about meal planning, he reported none. He has not had a previous visit with a dietitian. He rarely participates in exercise. His home blood glucose trend is increasing rapidly. An ACE inhibitor/angiotensin II receptor blocker is not being taken. He does not see a podiatrist.Eye exam is not current.  Hypertension  This is a chronic problem. The current  episode started more than 1 year ago. The problem has been gradually worsening since onset. The problem is uncontrolled. Associated symptoms include anxiety, blurred vision and malaise/fatigue. Risk factors for coronary artery disease include diabetes mellitus, male gender and dyslipidemia. Past treatments include beta blockers.  Malachy was seen today for diabetes, hypertension and new patient (initial visit).  Diagnoses and all orders for this visit:  Essential hypertension, benign -     CBC with Differential  Coronary artery disease involving native coronary artery of native heart without angina pectoris  Diabetes mellitus due to underlying condition with hyperosmolarity without coma, unspecified whether long term insulin use (HCC) -     POCT glucose (manual entry) -     POCT glycosylated hemoglobin (Hb A1C) -     CMP14+EGFR -     Microalbumin / creatinine urine ratio -     insulin aspart (novoLOG) injection 20 Units -     POCT URINALYSIS DIP (CLINITEK)  Mixed hyperlipidemia -     Lipid panel  Other orders -     Blood Glucose Monitoring Suppl (TRUE METRIX METER) w/Device KIT; Use as directed -     glucose blood (TRUE METRIX BLOOD GLUCOSE TEST) test strip; Check blood sugar fasting and before meals and again if pt feels bad (symptoms of hypo). Check sugar three times a day -     TRUEplus Lancets 28G MISC; Check blood sugar fasting and before meals and again if pt feels bad (symptoms of hypo). Check sugar three  times a day    Follow-up: No follow-ups on file.   Kerin Perna, NP

## 2019-01-03 LAB — LIPID PANEL
Chol/HDL Ratio: 11.8 ratio — ABNORMAL HIGH (ref 0.0–5.0)
Cholesterol, Total: 224 mg/dL — ABNORMAL HIGH (ref 100–199)
HDL: 19 mg/dL — ABNORMAL LOW (ref >39)
Triglycerides: 1070 mg/dL (ref 0–149)

## 2019-01-03 LAB — CBC WITH DIFFERENTIAL/PLATELET
Basophils Absolute: 0 10*3/uL (ref 0.0–0.2)
Basos: 0 %
EOS (ABSOLUTE): 0.2 10*3/uL (ref 0.0–0.4)
Eos: 2 %
Hematocrit: 40.7 % (ref 37.5–51.0)
Hemoglobin: 13.5 g/dL (ref 13.0–17.7)
Immature Grans (Abs): 0 10*3/uL (ref 0.0–0.1)
Immature Granulocytes: 0 %
Lymphocytes Absolute: 1.9 10*3/uL (ref 0.7–3.1)
Lymphs: 22 %
MCH: 30.8 pg (ref 26.6–33.0)
MCHC: 33.2 g/dL (ref 31.5–35.7)
MCV: 93 fL (ref 79–97)
Monocytes Absolute: 0.5 10*3/uL (ref 0.1–0.9)
Monocytes: 6 %
Neutrophils Absolute: 5.7 10*3/uL (ref 1.4–7.0)
Neutrophils: 70 %
Platelets: 217 10*3/uL (ref 150–450)
RBC: 4.39 x10E6/uL (ref 4.14–5.80)
RDW: 13.1 % (ref 11.6–15.4)
WBC: 8.4 10*3/uL (ref 3.4–10.8)

## 2019-01-03 LAB — CMP14+EGFR
ALT: 21 IU/L (ref 0–44)
AST: 21 IU/L (ref 0–40)
Albumin/Globulin Ratio: 1.1 — ABNORMAL LOW (ref 1.2–2.2)
Albumin: 4 g/dL (ref 4.0–5.0)
Alkaline Phosphatase: 130 IU/L — ABNORMAL HIGH (ref 39–117)
BUN/Creatinine Ratio: 15 (ref 9–20)
BUN: 22 mg/dL (ref 6–24)
Bilirubin Total: 0.4 mg/dL (ref 0.0–1.2)
CO2: 17 mmol/L — ABNORMAL LOW (ref 20–29)
Calcium: 9.1 mg/dL (ref 8.7–10.2)
Chloride: 98 mmol/L (ref 96–106)
Creatinine, Ser: 1.43 mg/dL — ABNORMAL HIGH (ref 0.76–1.27)
GFR calc Af Amer: 67 mL/min/{1.73_m2} (ref >59)
GFR calc non Af Amer: 58 mL/min/{1.73_m2} — ABNORMAL LOW (ref >59)
Globulin, Total: 3.6 g/dL (ref 1.5–4.5)
Glucose: 418 mg/dL — ABNORMAL HIGH (ref 65–99)
Potassium: 4.7 mmol/L (ref 3.5–5.2)
Sodium: 133 mmol/L — ABNORMAL LOW (ref 134–144)
Total Protein: 7.6 g/dL (ref 6.0–8.5)

## 2019-01-03 LAB — MICROALBUMIN / CREATININE URINE RATIO
Creatinine, Urine: 136.1 mg/dL
Microalb/Creat Ratio: 91 mg/g creat — ABNORMAL HIGH (ref 0–29)
Microalbumin, Urine: 124.4 ug/mL

## 2019-01-04 NOTE — Progress Notes (Signed)
[place] Screening performed. Temperature, PHQ-9, and need for medical care and medications assessed. PHQ-9=7. Patient reports being on several medications that are about to run out and he does not have access to obtain additional medications. He knows he goes to [facility] for [facility], but does not know his provider's name. Referral made to [facility].  Arnold Long RN MSN

## 2019-01-05 ENCOUNTER — Other Ambulatory Visit: Payer: Self-pay | Admitting: Primary Care

## 2019-01-05 MED ORDER — OMEGA-3-ACID ETHYL ESTERS 1 G PO CAPS: 2.0000 g | ORAL_CAPSULE | Freq: Two times a day (BID) | ORAL | 3 refills | Status: DC

## 2019-01-05 MED ORDER — ATORVASTATIN CALCIUM 80 MG PO TABS: 80.0000 mg | ORAL_TABLET | Freq: Every day | ORAL | 3 refills | Status: DC

## 2019-01-05 MED FILL — OMEGA-3 ETHYL ESTERS 1 GM C: 1 | 30 days supply | Qty: 120 | Fill #0

## 2019-01-09 NOTE — Progress Notes (Signed)
COVID Hotel Screening performed. Temperature, PHQ-9, and need for medical care and medications assessed. No additional needs assessed at this time.  Tennessee Perra RN MSN 

## 2019-01-12 NOTE — Congregational Nurse Program (Signed)
Closing encounter per request. 

## 2019-01-16 ENCOUNTER — Ambulatory Visit: Payer: Self-pay | Attending: Family Medicine | Admitting: Pharmacist

## 2019-01-16 ENCOUNTER — Other Ambulatory Visit: Payer: Self-pay

## 2019-01-16 ENCOUNTER — Encounter: Payer: Self-pay | Admitting: Pharmacist

## 2019-01-16 DIAGNOSIS — E0800 Diabetes mellitus due to underlying condition with hyperosmolarity without nonketotic hyperglycemic-hyperosmolar coma (NKHHC): Secondary | ICD-10-CM

## 2019-01-16 MED ORDER — INSULIN GLARGINE 100 UNIT/ML ~~LOC~~ SOLN: 62.0000 [IU] | Freq: Every day | SUBCUTANEOUS | 2 refills | Status: DC

## 2019-01-16 MED ORDER — METFORMIN HCL ER 500 MG PO TB24: ORAL_TABLET | ORAL | 0 refills | Status: DC

## 2019-01-16 MED FILL — ?METFORMIN HCL ER 500 MG TA: 500 | 30 days supply | Qty: 30 | Fill #0

## 2019-01-16 MED FILL — !LANTUS 100 UNITS/ML VIAL: 100 | 32 days supply | Qty: 20 | Fill #0

## 2019-01-16 NOTE — Patient Instructions (Signed)
Thank you for coming to see me today. Please do the following:  1. Increase Lantus to 62 units at bedtime.  2. Start metformin 500 mg XR - take 1 tablet every day after breakfast.  3. Continue checking blood sugars at home.  4. Continue making the lifestyle changes we've discussed together during our visit. Diet and exercise play a significant role in improving your blood sugars.  5. Follow-up with me in 2 weeks.    Hypoglycemia or low blood sugar:   Low blood sugar can happen quickly and may become an emergency if not treated right away.   While this shouldn't happen often, it can be brought upon if you skip a meal or do not eat enough. Also, if your insulin or other diabetes medications are dosed too high, this can cause your blood sugar to go to low.   Warning signs of low blood sugar include: 1. Feeling shaky or dizzy 2. Feeling weak or tired  3. Excessive hunger 4. Feeling anxious or upset  5. Sweating even when you aren't exercising  What to do if I experience low blood sugar? 1. Check your blood sugar with your meter. If lower than 70, proceed to step 2.  2. Treat with 3-4 glucose tablets or 3 packets of regular sugar. If these aren't around, you can try hard candy. Yet another option would be to drink 4 ounces of fruit juice or 6 ounces of REGULAR soda.  3. Re-check your sugar in 15 minutes. If it is still below 70, do what you did in step 2 again. If has come back up, go ahead and eat a snack or small meal at this time.

## 2019-01-16 NOTE — Progress Notes (Signed)
    S:    PCP: Sharyn Lull  No chief complaint on file.  Patient arrives in good spirits. Presents for diabetes evaluation, education, and management at the request of Sharyn Lull. Patient was referred on 01/02/19.   Patient reports diabetes was diagnosed >10 yrs ago.   Family/Social History:  - FHx: stroke (mother) - Current 2 PPD smoker - Denies alcohol use  Insurance coverage/medication affordability: self-pay  Patient reports adherence with medications.  Current diabetes medications include:  - Lantus 42 units daily (pt reports titrating up to 62 units, but sometimes takes 42 units if he feels his sugar is too low)  Patient denies hypoglycemic events.  Patient reported dietary habits:  - Admits to dietary indiscretion - Reports intake of sweetened beverages, desserts and carbohydrates  Patient-reported exercise habits:  - Denies    Patient denies nocturia.  Patient reports neuropathy. Patient denies visual changes. Patient reports self foot exams.   O:  POCT: not taken  Reports home sugars in the 300-400s.   Lab Results  Component Value Date   HGBA1C  01/02/2019     Comment:     Greater than 15.0   There were no vitals filed for this visit.  Lipid Panel     Component Value Date/Time   CHOL 224 (H) 01/02/2019 1014   TRIG 1,070 (HH) 01/02/2019 1014   HDL 19 (L) 01/02/2019 1014   CHOLHDL 11.8 (H) 01/02/2019 1014   CHOLHDL NOT REPORTED DUE TO HIGH TRIGLYCERIDES 08/27/2014 0327   VLDL UNABLE TO CALCULATE IF TRIGLYCERIDE OVER 400 mg/dL 08/27/2014 0327   LDLCALC Comment 01/02/2019 1014    Clinical ASCVD: Yes  The ASCVD Risk score Mikey Bussing DC Jr., et al., 2013) failed to calculate for the following reasons:   The valid HDL cholesterol range is 20 to 100 mg/dL   A/P: Diabetes longstanding currently uncontrolled. Patient is able to verbalize appropriate hypoglycemia management plan. Patient is adherent with medication. Control is suboptimal due to dietary  indiscretion, sedentary lifestyle, and not taking insulin at a consistent dose.   Will titrate insulin now to resolve hyperglycemia. Will start metformin 500 mg XR today. Kidney function reviewed - metformin okay to take for now. Ultimately, I recommend to add Victoza for ASCVD benefit and improved glycemic control. Will address at a future visit.  . -Increased dose of Lantus to 62 units daily. Encouraged pt to take same dose every day.  -Started metformin 500 mg XR daily.  -Extensively discussed pathophysiology of DM, recommended lifestyle interventions, dietary effects on glycemic control -Counseled on s/sx of and management of hypoglycemia -Next A1C anticipated 03/2019.  -Microalb:SCr elevated - pt on lisinopril.  ASCVD risk - secondary prevention in patient with DM. Last LDL is not controlled. ASA and high intensity statin indicated.  -Continued aspirin 81 mg  -Continued atorvastatin 80 mg.   HM:  - Vaccines: UTD on PNA and tetanus vaccines.   Written patient instructions provided. Total time in face to face counseling 30 minutes.   Follow up Pharmacist Clinic Visit in 2 weeks.  Benard Halsted, PharmD, [place] [phone]

## 2019-01-16 NOTE — Progress Notes (Signed)
COVID Hotel Screening performed. Temperature, PHQ-9, and need for medical care and medications assessed. No additional needs assessed at this time.  Aslan Himes RN MSN 

## 2019-01-23 NOTE — Progress Notes (Signed)
[place] Screening performed. Temperature, PHQ-9, and need for medical care and medications assessed. Patient has a history of DM, Gout, Neuropathy, triple bypass not too long ago. He does not have insurance coverage and has not seen his provider since he is at [facility] and patient cannot get there. Referral made for provider to check him out and provide care. Referral sent to [facility].  Arnold Long RN MSN

## 2019-01-26 ENCOUNTER — Ambulatory Visit: Payer: Self-pay | Admitting: Pharmacist

## 2019-01-27 ENCOUNTER — Telehealth: Payer: Self-pay | Admitting: Pediatric Intensive Care

## 2019-01-27 NOTE — Telephone Encounter (Signed)
Do I need to order a OSA

## 2019-01-27 NOTE — Telephone Encounter (Signed)
What would you like me to do?

## 2019-01-27 NOTE — Telephone Encounter (Signed)
Call to client- ID x2. Client states that he is having sleep issues as his CPAP machine is broken. He would like a new CPAP machine. He also states that he is having difficulties transferring into bathtub dues to increased leg swelling.CN will see if there is any assistive device to use for safe transfer. Follow up call next week. Lisette Abu RN BSN CNP [phone]

## 2019-01-28 ENCOUNTER — Other Ambulatory Visit: Payer: Self-pay | Admitting: Primary Care

## 2019-01-28 DIAGNOSIS — G4733 Obstructive sleep apnea (adult) (pediatric): Secondary | ICD-10-CM

## 2019-01-30 NOTE — Progress Notes (Signed)
[place] Screening performed. Temperature, PHQ-9, and need for medical care and medications assessed. Patient reports that he has not been sleeping well as he is not able to use his old CPAP. He had triple bypass surgery last June and site has healed. Encouraged him to talk to his provider about obtaining a new CPAP. Patient stated he would give the provider a call. No additional needs assessed at this time.  Arnold Long RN MSN

## 2019-02-03 ENCOUNTER — Telehealth: Payer: Self-pay | Admitting: *Deleted

## 2019-02-03 DIAGNOSIS — Z20822 Contact with and (suspected) exposure to covid-19: Secondary | ICD-10-CM

## 2019-02-03 NOTE — Telephone Encounter (Signed)
Order placed for COVID-19 testing.  

## 2019-02-06 NOTE — Progress Notes (Signed)
COVID Hotel Screening performed. Temperature, PHQ-9, and need for medical care and medications assessed. Patient agreed to the COVID-19 testing. No additional needs assessed at this time.  Keanen Dohse RN MSN 

## 2019-02-11 LAB — NOVEL CORONAVIRUS, NAA: SARS-CoV-2, NAA: NOT DETECTED

## 2019-02-11 NOTE — Progress Notes (Signed)
[place] Screening performed. Temperature, PHQ-9, and need for medical care and medications assessed. Patient reports that he needs medicaid - is going to social service tomorrow. Also needs food stamps. Patient had a a triple bypass and did not finish his cardiac Rehab at [facility].  He also states that he is more fatigued when he is walking. Would like to see a provider in town about his health. Referral sent to [facility].  Arnold Long RN MSN

## 2019-02-12 ENCOUNTER — Telehealth: Payer: Self-pay

## 2019-02-12 ENCOUNTER — Encounter: Payer: Self-pay | Admitting: *Deleted

## 2019-02-12 NOTE — Telephone Encounter (Signed)
Patient advised COVID test results are negative.

## 2019-02-20 NOTE — Progress Notes (Signed)
COVID Hotel Screening performed. Temperature, PHQ-9, and need for medical care and medications assessed. No additional needs assessed at this time.  Howard Hoston  MSN, RN 

## 2019-02-20 NOTE — Congregational Nurse Program (Unsigned)
COVID Hotel Screening performed. Temperature, PHQ-9, and need for medical care and medications assessed. No additional needs assessed at this time.  [name]  MSN, RN 

## 2019-02-27 NOTE — Progress Notes (Signed)
COVID Hotel Screening performed. Temperature, PHQ-9, and need for medical care and medications assessed. No additional needs assessed at this time.  Shakenna Herrero  MSN, RN 

## 2019-03-02 ENCOUNTER — Ambulatory Visit (HOSPITAL_BASED_OUTPATIENT_CLINIC_OR_DEPARTMENT_OTHER): Payer: Self-pay | Attending: Primary Care

## 2019-03-02 ENCOUNTER — Other Ambulatory Visit: Payer: Self-pay

## 2019-03-08 NOTE — Progress Notes (Signed)
COVID Hotel Screening performed. Temperature, PHQ-9, and need for medical care and medications assessed. No additional needs assessed at this time.  Rayola Everhart  MSN, RN 

## 2019-03-20 ENCOUNTER — Other Ambulatory Visit: Payer: Self-pay | Admitting: Family Medicine

## 2019-03-20 ENCOUNTER — Other Ambulatory Visit: Payer: Self-pay

## 2019-03-20 DIAGNOSIS — Z20822 Contact with and (suspected) exposure to covid-19: Secondary | ICD-10-CM

## 2019-03-20 MED FILL — $LANTUS 100 UNITS/ML VIAL: 100 | 32 days supply | Qty: 20 | Fill #1

## 2019-03-23 MED FILL — METFORMIN HCL ER 500 MG TB2: 500 | 30 days supply | Qty: 30 | Fill #0

## 2019-03-25 LAB — NOVEL CORONAVIRUS, NAA: SARS-CoV-2, NAA: NOT DETECTED

## 2019-05-03 NOTE — Progress Notes (Signed)
COVID-19 Screening performed. Temperature, PHQ-9, and need for medical care and medications assessed. No additional needs assessed at this time.  Williard Keller MSN, RN 

## 2019-05-08 NOTE — Progress Notes (Signed)
COVID-19 Screening performed. Temperature, PHQ-9, and need for medical care and medications assessed. No additional needs assessed at this time.  Fay Bagg MSN, RN 

## 2019-05-10 NOTE — Progress Notes (Signed)
COVID-19 Screening performed. Temperature, PHQ-9, and need for medical care and medications assessed. No additional needs assessed at this time.  Gawain Crombie MSN, RN 

## 2019-05-11 NOTE — Progress Notes (Signed)
COVID-19 Screening performed. Temperature, PHQ-9, and need for medical care and medications assessed. No additional needs assessed at this time.  Nicklous Aburto MSN, RN 

## 2019-06-02 NOTE — Progress Notes (Signed)
COVID-19 Screening performed. Temperature, PHQ-9, and need for medical care and medications assessed. No additional needs assessed at this time.  Avarose Mervine MSN, RN 

## 2019-06-02 NOTE — Progress Notes (Signed)
COVID-19 Screening performed. Temperature, PHQ-9, and need for medical care and medications assessed. No additional needs assessed at this time.  Azaela Caracci MSN, RN 

## 2019-10-09 ENCOUNTER — Encounter (HOSPITAL_COMMUNITY): Payer: Self-pay | Admitting: Emergency Medicine

## 2019-10-09 ENCOUNTER — Other Ambulatory Visit: Payer: Self-pay

## 2019-10-09 ENCOUNTER — Emergency Department (HOSPITAL_COMMUNITY): Payer: Medicaid Other

## 2019-10-09 ENCOUNTER — Emergency Department (HOSPITAL_COMMUNITY)
Admission: EM | Admit: 2019-10-09 | Discharge: 2019-10-10 | Disposition: A | Discharge status: Home / Self Care | Payer: Medicaid Other | Attending: Emergency Medicine | Admitting: Emergency Medicine

## 2019-10-09 DIAGNOSIS — I10 Essential (primary) hypertension: Secondary | ICD-10-CM | POA: Insufficient documentation

## 2019-10-09 DIAGNOSIS — U071 COVID-19: Secondary | ICD-10-CM

## 2019-10-09 DIAGNOSIS — Z96641 Presence of right artificial hip joint: Secondary | ICD-10-CM | POA: Insufficient documentation

## 2019-10-09 DIAGNOSIS — I2510 Atherosclerotic heart disease of native coronary artery without angina pectoris: Secondary | ICD-10-CM | POA: Insufficient documentation

## 2019-10-09 DIAGNOSIS — F17210 Nicotine dependence, cigarettes, uncomplicated: Secondary | ICD-10-CM | POA: Insufficient documentation

## 2019-10-09 DIAGNOSIS — Z955 Presence of coronary angioplasty implant and graft: Secondary | ICD-10-CM | POA: Insufficient documentation

## 2019-10-09 DIAGNOSIS — Z794 Long term (current) use of insulin: Secondary | ICD-10-CM | POA: Insufficient documentation

## 2019-10-09 DIAGNOSIS — E1165 Type 2 diabetes mellitus with hyperglycemia: Secondary | ICD-10-CM | POA: Insufficient documentation

## 2019-10-09 DIAGNOSIS — R0602 Shortness of breath: Secondary | ICD-10-CM | POA: Insufficient documentation

## 2019-10-09 DIAGNOSIS — Z7982 Long term (current) use of aspirin: Secondary | ICD-10-CM | POA: Insufficient documentation

## 2019-10-09 DIAGNOSIS — Z7902 Long term (current) use of antithrombotics/antiplatelets: Secondary | ICD-10-CM | POA: Insufficient documentation

## 2019-10-09 DIAGNOSIS — Z79899 Other long term (current) drug therapy: Secondary | ICD-10-CM | POA: Insufficient documentation

## 2019-10-09 DIAGNOSIS — R739 Hyperglycemia, unspecified: Secondary | ICD-10-CM

## 2019-10-09 LAB — CBC WITH DIFFERENTIAL/PLATELET
Abs Immature Granulocytes: 0.05 10*3/uL (ref 0.00–0.07)
Basophils Absolute: 0 10*3/uL (ref 0.0–0.1)
Basophils Relative: 0 %
Eosinophils Absolute: 0.2 10*3/uL (ref 0.0–0.5)
Eosinophils Relative: 2 %
HCT: 35.7 % — ABNORMAL LOW (ref 39.0–52.0)
Hemoglobin: 11.9 g/dL — ABNORMAL LOW (ref 13.0–17.0)
Immature Granulocytes: 1 %
Lymphocytes Relative: 11 %
Lymphs Abs: 0.9 10*3/uL (ref 0.7–4.0)
MCH: 31.4 pg (ref 26.0–34.0)
MCHC: 33.3 g/dL (ref 30.0–36.0)
MCV: 94.2 fL (ref 80.0–100.0)
Monocytes Absolute: 0.7 10*3/uL (ref 0.1–1.0)
Monocytes Relative: 9 %
Neutro Abs: 6.3 10*3/uL (ref 1.7–7.7)
Neutrophils Relative %: 77 %
Platelets: 187 10*3/uL (ref 150–400)
RBC: 3.79 MIL/uL — ABNORMAL LOW (ref 4.22–5.81)
RDW: 12.6 % (ref 11.5–15.5)
WBC: 8.1 10*3/uL (ref 4.0–10.5)
nRBC: 0 % (ref 0.0–0.2)

## 2019-10-09 LAB — COMPREHENSIVE METABOLIC PANEL
ALT: 22 U/L (ref 0–44)
AST: 24 U/L (ref 15–41)
Albumin: 2.7 g/dL — ABNORMAL LOW (ref 3.5–5.0)
Alkaline Phosphatase: 101 U/L (ref 38–126)
Anion gap: 10 (ref 5–15)
BUN: 15 mg/dL (ref 6–20)
CO2: 22 mmol/L (ref 22–32)
Calcium: 8.8 mg/dL — ABNORMAL LOW (ref 8.9–10.3)
Chloride: 106 mmol/L (ref 98–111)
Creatinine, Ser: 1.59 mg/dL — ABNORMAL HIGH (ref 0.61–1.24)
GFR calc Af Amer: 59 mL/min — ABNORMAL LOW (ref >60)
GFR calc non Af Amer: 51 mL/min — ABNORMAL LOW (ref >60)
Glucose, Bld: 446 mg/dL — ABNORMAL HIGH (ref 70–99)
Potassium: 3.9 mmol/L (ref 3.5–5.1)
Sodium: 138 mmol/L (ref 135–145)
Total Bilirubin: 0.5 mg/dL (ref 0.3–1.2)
Total Protein: 6.8 g/dL (ref 6.5–8.1)

## 2019-10-09 LAB — CBG MONITORING, ED: Glucose-Capillary: 452 mg/dL — ABNORMAL HIGH (ref 70–99)

## 2019-10-09 NOTE — ED Notes (Signed)
Pt given urinal, made aware that urine sample is needed.

## 2019-10-09 NOTE — ED Triage Notes (Signed)
Pt coming by EMS after developing chills all day, 100 temp. Pt tachy in 120, pt out of meds for 2 weeks for beta blockers and Lasix due to financial concerns. CBG 531, pt states "that is normal" for CBG. Triple bypass last June 2020. 324mg  of ASA and 1 nitro, but is not having chest pain currently. HTN at 166/100. Received 355ml of fluids. Sats 96-100% on RA.

## 2019-10-10 LAB — URINALYSIS, ROUTINE W REFLEX MICROSCOPIC
Bacteria, UA: NONE SEEN
Bilirubin Urine: NEGATIVE
Glucose, UA: >=500 mg/dL — AB
Ketones, ur: NEGATIVE mg/dL
Leukocytes,Ua: NEGATIVE
Nitrite: NEGATIVE
Protein, ur: 100 mg/dL — AB
Specific Gravity, Urine: 1.017 (ref 1.005–1.030)
pH: 7 (ref 5.0–8.0)

## 2019-10-10 LAB — BRAIN NATRIURETIC PEPTIDE: B Natriuretic Peptide: 38.7 pg/mL (ref 0.0–100.0)

## 2019-10-10 LAB — CBG MONITORING, ED: Glucose-Capillary: 230 mg/dL — ABNORMAL HIGH (ref 70–99)

## 2019-10-10 LAB — POC SARS CORONAVIRUS 2 AG -  ED: SARS Coronavirus 2 Ag: POSITIVE — AB

## 2019-10-10 MED ORDER — "INSULIN SYRINGE-NEEDLE U-100 31G X 15/64"" 1 ML MISC": 12 refills | Status: DC

## 2019-10-10 MED ORDER — METFORMIN HCL ER 500 MG PO TB24: ORAL_TABLET | ORAL | 0 refills | Status: DC

## 2019-10-10 MED ORDER — LISINOPRIL 20 MG PO TABS: 40.0000 mg | ORAL_TABLET | Freq: Two times a day (BID) | ORAL | 0 refills | Status: DC

## 2019-10-10 MED ORDER — INSULIN ASPART 100 UNIT/ML ~~LOC~~ SOLN
5.0000 [IU] | Freq: Once | SUBCUTANEOUS | Status: AC
  Administered 2019-10-10: 5 [IU] via INTRAVENOUS

## 2019-10-10 MED ORDER — INSULIN GLARGINE 100 UNIT/ML ~~LOC~~ SOLN: 62.0000 [IU] | Freq: Every day | SUBCUTANEOUS | 2 refills | Status: DC

## 2019-10-10 MED ORDER — TRUEPLUS LANCETS 28G MISC: 12 refills | Status: DC

## 2019-10-10 MED ORDER — ACETAMINOPHEN 500 MG PO TABS
1000.0000 mg | ORAL_TABLET | Freq: Once | ORAL | Status: AC
  Administered 2019-10-10: 1000 mg via ORAL
  Filled 2019-10-10: qty 2

## 2019-10-10 MED ORDER — CLOPIDOGREL BISULFATE 75 MG PO TABS: 75.0000 mg | ORAL_TABLET | Freq: Every day | ORAL | 0 refills | Status: DC

## 2019-10-10 MED ORDER — KETOROLAC TROMETHAMINE 30 MG/ML IJ SOLN
15.0000 mg | Freq: Once | INTRAMUSCULAR | Status: AC
  Administered 2019-10-10: 15 mg via INTRAVENOUS
  Filled 2019-10-10: qty 1

## 2019-10-10 MED ORDER — METOPROLOL TARTRATE 5 MG/5ML IV SOLN
2.5000 mg | Freq: Once | INTRAVENOUS | Status: AC
  Administered 2019-10-10: 2.5 mg via INTRAVENOUS
  Filled 2019-10-10: qty 5

## 2019-10-10 MED ORDER — METFORMIN HCL 500 MG PO TABS
500.0000 mg | ORAL_TABLET | Freq: Once | ORAL | Status: AC
  Administered 2019-10-10: 500 mg via ORAL
  Filled 2019-10-10: qty 1

## 2019-10-10 MED ORDER — TRUE METRIX BLOOD GLUCOSE TEST VI STRP: ORAL_STRIP | 12 refills | Status: DC

## 2019-10-10 NOTE — ED Notes (Signed)
Informed RN Mortimer Fries of pos covid

## 2019-10-10 NOTE — ED Provider Notes (Signed)
[facility] EMERGENCY DEPARTMENT Provider Note   CSN: [id number] Arrival date & time: 10/09/19  2225     History No chief complaint on file.   Howard Mcmahon is a 49 y.o. male.  Patient to ED with complaint of "chills" that started today. No cough, body aches, sore throat, congestion. He has SOB but reports this is his baseline and does not feel it has changed. No vomiting. He states he had similar symptoms in the past and was diagnosed as having "a blood infection".   The history is provided by the patient. No language interpreter was used.       Past Medical History:  Diagnosis Date  . Diabetes mellitus without complication ([place])   . Hypertension   . Macular infarction    2012  . MI (myocardial infarction) [facility])     Patient Active Problem List   Diagnosis Date Noted  . Left sided numbness   . Fever   . HCAP (healthcare-associated pneumonia)   . Blood poisoning   . Diabetic ketoacidosis without coma associated with diabetes mellitus due to underlying condition ([place])   . SVT (supraventricular tachycardia) ([place])   . HHNC (hyperglycemic hyperosmolar nonketotic coma) ([place])   . Essential hypertension   . Pain in the chest   . Coronary artery disease with unspecified angina pectoris   . HLD (hyperlipidemia)   . Leukocytosis   . Numbness and tingling in left hand   . Noncompliance with medication regimen   . Chest pain 08/26/2014  . Paresthesia of left arm 08/26/2014  . Hyperglycemia due to type 2 diabetes mellitus ([place]) 08/26/2014  . DM (diabetes mellitus) ([place]) 01/25/2014  . DKA (diabetic ketoacidoses) ([place]) 01/09/2014  . Type II or unspecified type diabetes mellitus with unspecified complication, uncontrolled 01/09/2014  . Essential hypertension, benign 01/09/2014  . CAD (coronary artery disease) 01/09/2014  . Fever presenting with conditions classified elsewhere 01/09/2014  . Leukocytosis, unspecified 01/09/2014  . Elevated lactic acid level  01/09/2014    Past Surgical History:  Procedure Laterality Date  . CORONARY ANGIOPLASTY WITH STENT PLACEMENT    . HIP ARTHROPLASTY Right        Family History  Problem Relation Age of Onset  . Stroke Mother     Social History   Tobacco Use  . Smoking status: Current Every Day Smoker    Packs/day: 2.00    Years: 30.00    Pack years: 60.00    Types: Cigarettes  . Smokeless tobacco: Current User  Substance Use Topics  . Alcohol use: No  . Drug use: No    Home Medications Prior to Admission medications   Medication Sig Start Date End Date Taking? Authorizing Provider  aspirin EC 81 MG tablet Take 81 mg by mouth daily.    [provider]  atorvastatin (LIPITOR) 80 MG tablet Take 1 tablet (80 mg total) by mouth daily for 30 days. 01/05/19 02/04/19  Kerin Perna, NP  Blood Glucose Monitoring Suppl (TRUE METRIX METER) w/Device KIT Use as directed 01/02/19   Kerin Perna, NP  clopidogrel (PLAVIX) 75 MG tablet Take 75 mg by mouth daily with breakfast.    [provider]  glucose blood (TRUE METRIX BLOOD GLUCOSE TEST) test strip Check blood sugar fasting and before meals and again if pt feels bad (symptoms of hypo). Check sugar three times a day 01/02/19   Kerin Perna, NP  insulin glargine (LANTUS) 100 UNIT/ML injection Inject 0.62 mLs (62 Units total) into  the skin at bedtime. 01/16/19   Charlott Rakes, MD  Insulin Syringe-Needle U-100 (BD INSULIN SYRINGE ULTRAFINE) 31G X 15/64" 1 ML MISC Take 42 units every night after your protein snack at bedtime 01/02/19   Kerin Perna, NP  lisinopril (PRINIVIL,ZESTRIL) 20 MG tablet Take 40 mg by mouth 2 (two) times daily.     [provider]  metFORMIN (GLUCOPHAGE-XR) 500 MG 24 hr tablet TAKE 1 TABLET BY MOUTH DAILY AFTER BREAKFAST. 03/23/19   Kerin Perna, NP  metoprolol tartrate (LOPRESSOR) 12.5 mg TABS tablet Take 0.5 tablets (12.5 mg total) by mouth 2 (two) times daily. Patient not  taking: Reported on 01/02/2019 08/31/14   Allie Bossier, MD  NEEDLE, DISP, 25 G 25G X 1" MISC 32 Units by Does not apply route 2 (two) times daily. Patient not taking: Reported on 12/28/2018 08/31/14   Allie Bossier, MD  nicotine (NICODERM CQ - DOSED IN MG/24 HOURS) 14 mg/24hr patch Place 1 patch (14 mg total) onto the skin daily. Patient not taking: Reported on 12/28/2018 08/31/14   Allie Bossier, MD  omega-3 acid ethyl esters (LOVAZA) 1 g capsule Take 2 capsules (2 g total) by mouth 2 (two) times daily. 01/05/19   Kerin Perna, NP  oxyCODONE (OXY IR/ROXICODONE) 5 MG immediate release tablet Take 1 tablet (5 mg total) by mouth every 4 (four) hours as needed for moderate pain. Patient not taking: Reported on 12/28/2018 01/12/14   Theodis Blaze, MD  senna-docusate (SENOKOT-S) 8.6-50 MG per tablet Take 1 tablet by mouth 2 (two) times daily. Patient not taking: Reported on 12/28/2018 01/12/14   Theodis Blaze, MD  TRUEplus Lancets 28G MISC Check blood sugar fasting and before meals and again if pt feels bad (symptoms of hypo). Check sugar three times a day 01/02/19   Kerin Perna, NP    Allergies    Patient has no known allergies.  Review of Systems   Review of Systems  Constitutional: Positive for chills. Negative for activity change, appetite change and fever.  HENT: Negative.   Respiratory: Negative.        See HPI.  Cardiovascular: Negative.   Gastrointestinal: Negative.  Negative for nausea.  Musculoskeletal: Negative.  Negative for myalgias.  Skin: Negative.   Neurological: Negative.  Negative for weakness and headaches.    Physical Exam Updated Vital Signs Ht '6\' 4"'$  (1.93 m)   Wt 117.9 kg   BMI 31.65 kg/m   Physical Exam Vitals and nursing note reviewed.  Constitutional:      Appearance: He is well-developed. He is obese.  HENT:     Head: Normocephalic.  Cardiovascular:     Rate and Rhythm: Normal rate and regular rhythm.  Pulmonary:     Effort: Pulmonary  effort is normal.     Breath sounds: Normal breath sounds. No wheezing, rhonchi or rales.  Abdominal:     General: Bowel sounds are normal.     Palpations: Abdomen is soft.     Tenderness: There is no abdominal tenderness. There is no guarding or rebound.  Musculoskeletal:        General: Normal range of motion.     Cervical back: Normal range of motion and neck supple.  Skin:    General: Skin is warm and dry.     Findings: No rash.  Neurological:     Mental Status: He is alert and oriented to person, place, and time.     ED Results / Procedures /  Treatments   Labs (all labs ordered are listed, but only abnormal results are displayed) Labs Reviewed  CBC WITH DIFFERENTIAL/PLATELET - Abnormal; Notable for the following components:      Result Value   RBC 3.79 (*)    Hemoglobin 11.9 (*)    HCT 35.7 (*)    All other components within normal limits  COMPREHENSIVE METABOLIC PANEL - Abnormal; Notable for the following components:   Glucose, Bld 446 (*)    Creatinine, Ser 1.59 (*)    Calcium 8.8 (*)    Albumin 2.7 (*)    GFR calc non Af Amer 51 (*)    GFR calc Af Amer 59 (*)    All other components within normal limits  CBG MONITORING, ED - Abnormal; Notable for the following components:   Glucose-Capillary 452 (*)    All other components within normal limits  POC SARS CORONAVIRUS 2 AG -  ED - Abnormal; Notable for the following components:   SARS Coronavirus 2 Ag POSITIVE (*)    All other components within normal limits  URINE CULTURE  CULTURE, BLOOD (ROUTINE X 2)  CULTURE, BLOOD (ROUTINE X 2)  BRAIN NATRIURETIC PEPTIDE  URINALYSIS, ROUTINE W REFLEX MICROSCOPIC   Results for orders placed or performed during the hospital encounter of 10/09/19  CBC with Differential  Result Value Ref Range   WBC 8.1 4.0 - 10.5 K/uL   RBC 3.79 (L) 4.22 - 5.81 MIL/uL   Hemoglobin 11.9 (L) 13.0 - 17.0 g/dL   HCT 35.7 (L) 39.0 - 52.0 %   MCV 94.2 80.0 - 100.0 fL   MCH 31.4 26.0 - 34.0 pg    MCHC 33.3 30.0 - 36.0 g/dL   RDW 12.6 11.5 - 15.5 %   Platelets 187 150 - 400 K/uL   nRBC 0.0 0.0 - 0.2 %   Neutrophils Relative % 77 %   Neutro Abs 6.3 1.7 - 7.7 K/uL   Lymphocytes Relative 11 %   Lymphs Abs 0.9 0.7 - 4.0 K/uL   Monocytes Relative 9 %   Monocytes Absolute 0.7 0.1 - 1.0 K/uL   Eosinophils Relative 2 %   Eosinophils Absolute 0.2 0.0 - 0.5 K/uL   Basophils Relative 0 %   Basophils Absolute 0.0 0.0 - 0.1 K/uL   Immature Granulocytes 1 %   Abs Immature Granulocytes 0.05 0.00 - 0.07 K/uL  Comprehensive metabolic panel  Result Value Ref Range   Sodium 138 135 - 145 mmol/L   Potassium 3.9 3.5 - 5.1 mmol/L   Chloride 106 98 - 111 mmol/L   CO2 22 22 - 32 mmol/L   Glucose, Bld 446 (H) 70 - 99 mg/dL   BUN 15 6 - 20 mg/dL   Creatinine, Ser 1.59 (H) 0.61 - 1.24 mg/dL   Calcium 8.8 (L) 8.9 - 10.3 mg/dL   Total Protein 6.8 6.5 - 8.1 g/dL   Albumin 2.7 (L) 3.5 - 5.0 g/dL   AST 24 15 - 41 U/L   ALT 22 0 - 44 U/L   Alkaline Phosphatase 101 38 - 126 U/L   Total Bilirubin 0.5 0.3 - 1.2 mg/dL   GFR calc non Af Amer 51 (L) >60 mL/min   GFR calc Af Amer 59 (L) >60 mL/min   Anion gap 10 5 - 15  Urinalysis, Routine w reflex microscopic  Result Value Ref Range   Color, Urine STRAW (A) YELLOW   APPearance CLEAR CLEAR   Specific Gravity, Urine 1.017 1.005 - 1.030  pH 7.0 5.0 - 8.0   Glucose, UA >=500 (A) NEGATIVE mg/dL   Hgb urine dipstick MODERATE (A) NEGATIVE   Bilirubin Urine NEGATIVE NEGATIVE   Ketones, ur NEGATIVE NEGATIVE mg/dL   Protein, ur 100 (A) NEGATIVE mg/dL   Nitrite NEGATIVE NEGATIVE   Leukocytes,Ua NEGATIVE NEGATIVE   RBC / HPF 11-20 0 - 5 RBC/hpf   WBC, UA 0-5 0 - 5 WBC/hpf   Bacteria, UA NONE SEEN NONE SEEN   Squamous Epithelial / LPF 0-5 0 - 5   Hyaline Casts, UA PRESENT   Brain natriuretic peptide  Result Value Ref Range   B Natriuretic Peptide 38.7 0.0 - 100.0 pg/mL  CBG monitoring, ED  Result Value Ref Range   Glucose-Capillary 452 (H) 70 - 99  mg/dL  POC SARS Coronavirus 2 Ag-ED - Nasal Swab (BD Veritor Kit)  Result Value Ref Range   SARS Coronavirus 2 Ag POSITIVE (A) NEGATIVE  POC CBG, ED  Result Value Ref Range   Glucose-Capillary 230 (H) 70 - 99 mg/dL    EKG EKG Interpretation  Date/Time:  Friday October 09 2019 22:31:46 EST Ventricular Rate:  111 PR Interval:    QRS Duration: 88 QT Interval:  311 QTC Calculation: 423 R Axis:   8 Text Interpretation: Sinus tachycardia When compared to prior, faster rate. No STEMI Confirmed by Antony Blackbird [phone]) on 10/09/2019 10:37:02 PM   Radiology DG Chest Portable 1 View  Result Date: 10/09/2019 CLINICAL DATA:  Chills. EXAM: PORTABLE CHEST 1 VIEW COMPARISON:  August 31, 2014 FINDINGS: Multiple sternal wires are seen. This represents a new finding when compared to the prior study. There is no evidence of acute infiltrate, pleural effusion or pneumothorax. The heart size and mediastinal contours are within normal limits. Degenerative changes seen throughout the thoracic spine. IMPRESSION: 1. Interval median sternotomy since the prior study dated August 29, 2014. 2. No acute or active cardiopulmonary disease. Electronically Signed   By: Virgina Norfolk M.D.   On: 10/09/2019 23:26    Procedures Procedures (including critical care time)  Medications Ordered in ED Medications - No data to display  ED Course  I have reviewed the triage vital signs and the nursing notes.  Pertinent labs & imaging results that were available during my care of the patient were reviewed by me and considered in my medical decision making (see chart for details).    MDM Rules/Calculators/A&P                      Patient to ED with concern for chills as this indicated "a blood infection" in the past. No other new symptoms.   He is overall well appearing. Appears older than actual age. He is tachycardic but febrile to 101, and hypertensive on arrival. No hypoxia.   Lung sounds are clear. CXR  without infiltrates or opacities. Labs pending.   CBG high at 452, confirmed on CMET at 446. No evidence of DKA. "Its always that high". Insulin provided and CBG improves to 230. He reports being out of his medications secondary to financial reasons. His primary care provider is in [place] and he is living in [place] now.   COVID positive. No other infection identified. Tachycardia resolves with defervescence. He has remained in the 90's oxygen saturation for the duration of his ED stay. He is ambulated and does not desaturate. He feels better now than on arrival.   Social work consult requested, who is expected to contact the patient later  this morning regarding resources for his medications. Will refer to [facility] and provide resource list to get a primary care provider. Discussed the possibility of providing a small amount of his medication until he can access available resources, but was told social work contact for the [employer] was the best resource.   Patient to be discharged home. Discussed strict return precautions.   Final Clinical Impression(s) / ED Diagnoses Final diagnoses:  None   1. COVID infection 2. Hyperglycemia 3. Medication noncompliance    Rx / DC Orders ED Discharge Orders    None       Charlann Lange, PA-C 10/10/19 [id number]    Tegeler, Gwenyth Allegra, MD 10/10/19 [phone]

## 2019-10-10 NOTE — Discharge Instructions (Signed)
Please contact the [facility] for an appointment to continue your much needed regular medical care and prescriptions.   New prescriptions have been written for you and sent to the [facility] where you have received other prescriptions. Social work will contact you to provide resources for you to be able to fill your regular prescriptions.   Follow the instructions for isolating during your COVID infection. Take Tylenol for any fever. Please return to the emergency department with any significant shortness of breath or difficulty breathing, or if you develop new symptoms of concern.

## 2019-10-11 ENCOUNTER — Telehealth (HOSPITAL_BASED_OUTPATIENT_CLINIC_OR_DEPARTMENT_OTHER): Payer: Self-pay | Admitting: *Deleted

## 2019-10-11 LAB — URINE CULTURE

## 2019-10-11 NOTE — Telephone Encounter (Signed)
Attempt to call patient again this evening. No answer. Left message to call charge nurse regarding lab results.

## 2019-10-11 NOTE — Telephone Encounter (Signed)
Micro lab called with positive blood culture results gram (+) Rods. Dr. Betsey Holiday reviewed pt's chart and recommends the patient return to the [facility] ED for admission. Will call patient to inform him of his results/plan.

## 2019-10-12 MED FILL — metFORMIN HCL ER 500 MG TB2: 500 | 30 days supply | Qty: 30 | Fill #0

## 2019-10-12 MED FILL — CLOPIDOGREL 75 MG TABLET: 75 | 30 days supply | Qty: 30 | Fill #0

## 2019-10-12 MED FILL — $LANTUS 100 UNITS/ML VIAL: 100 | 32 days supply | Qty: 20 | Fill #0

## 2019-10-12 MED FILL — TRUEPLUS SYR 0.3ML 31GX5/16: 31G X 5/16" | 100 days supply | Qty: 100 | Fill #0

## 2019-10-12 MED FILL — TRUE METRIX TEST STRIP: 33 days supply | Qty: 100 | Fill #0

## 2019-10-12 MED FILL — TRUEplus LANCETS 28G MISC: 33 days supply | Qty: 100 | Fill #0

## 2019-10-12 MED FILL — LISINOPRIL 20 MG TABLET: 20 | 30 days supply | Qty: 120 | Fill #0

## 2019-10-13 ENCOUNTER — Inpatient Hospital Stay (HOSPITAL_COMMUNITY)
Admission: EM | Admit: 2019-10-13 | Discharge: 2019-10-20 | DRG: 177 | Disposition: A | Discharge status: Home Health | Payer: HRSA Program | Attending: Internal Medicine | Admitting: Internal Medicine

## 2019-10-13 ENCOUNTER — Emergency Department (HOSPITAL_COMMUNITY): Payer: HRSA Program

## 2019-10-13 DIAGNOSIS — E785 Hyperlipidemia, unspecified: Secondary | ICD-10-CM | POA: Diagnosis present

## 2019-10-13 DIAGNOSIS — Z823 Family history of stroke: Secondary | ICD-10-CM

## 2019-10-13 DIAGNOSIS — I252 Old myocardial infarction: Secondary | ICD-10-CM

## 2019-10-13 DIAGNOSIS — Z794 Long term (current) use of insulin: Secondary | ICD-10-CM | POA: Diagnosis not present

## 2019-10-13 DIAGNOSIS — D72819 Decreased white blood cell count, unspecified: Secondary | ICD-10-CM | POA: Diagnosis present

## 2019-10-13 DIAGNOSIS — T383X6A Underdosing of insulin and oral hypoglycemic [antidiabetic] drugs, initial encounter: Secondary | ICD-10-CM | POA: Diagnosis present

## 2019-10-13 DIAGNOSIS — K5900 Constipation, unspecified: Secondary | ICD-10-CM | POA: Diagnosis present

## 2019-10-13 DIAGNOSIS — Z7982 Long term (current) use of aspirin: Secondary | ICD-10-CM

## 2019-10-13 DIAGNOSIS — D638 Anemia in other chronic diseases classified elsewhere: Secondary | ICD-10-CM | POA: Diagnosis present

## 2019-10-13 DIAGNOSIS — E860 Dehydration: Secondary | ICD-10-CM | POA: Diagnosis present

## 2019-10-13 DIAGNOSIS — E871 Hypo-osmolality and hyponatremia: Secondary | ICD-10-CM | POA: Diagnosis present

## 2019-10-13 DIAGNOSIS — Z7902 Long term (current) use of antithrombotics/antiplatelets: Secondary | ICD-10-CM

## 2019-10-13 DIAGNOSIS — E872 Acidosis: Secondary | ICD-10-CM | POA: Diagnosis present

## 2019-10-13 DIAGNOSIS — J1282 Pneumonia due to coronavirus disease 2019: Secondary | ICD-10-CM | POA: Diagnosis present

## 2019-10-13 DIAGNOSIS — Z79899 Other long term (current) drug therapy: Secondary | ICD-10-CM | POA: Diagnosis not present

## 2019-10-13 DIAGNOSIS — Y92009 Unspecified place in unspecified non-institutional (private) residence as the place of occurrence of the external cause: Secondary | ICD-10-CM | POA: Diagnosis not present

## 2019-10-13 DIAGNOSIS — Z955 Presence of coronary angioplasty implant and graft: Secondary | ICD-10-CM | POA: Diagnosis not present

## 2019-10-13 DIAGNOSIS — E1165 Type 2 diabetes mellitus with hyperglycemia: Secondary | ICD-10-CM | POA: Diagnosis present

## 2019-10-13 DIAGNOSIS — Z91128 Patient's intentional underdosing of medication regimen for other reason: Secondary | ICD-10-CM

## 2019-10-13 DIAGNOSIS — F17210 Nicotine dependence, cigarettes, uncomplicated: Secondary | ICD-10-CM | POA: Diagnosis present

## 2019-10-13 DIAGNOSIS — I2510 Atherosclerotic heart disease of native coronary artery without angina pectoris: Secondary | ICD-10-CM | POA: Diagnosis present

## 2019-10-13 DIAGNOSIS — R739 Hyperglycemia, unspecified: Secondary | ICD-10-CM

## 2019-10-13 DIAGNOSIS — R531 Weakness: Secondary | ICD-10-CM

## 2019-10-13 DIAGNOSIS — Z951 Presence of aortocoronary bypass graft: Secondary | ICD-10-CM | POA: Diagnosis not present

## 2019-10-13 DIAGNOSIS — I10 Essential (primary) hypertension: Secondary | ICD-10-CM | POA: Diagnosis present

## 2019-10-13 DIAGNOSIS — E739 Lactose intolerance, unspecified: Secondary | ICD-10-CM | POA: Diagnosis present

## 2019-10-13 DIAGNOSIS — N179 Acute kidney failure, unspecified: Secondary | ICD-10-CM

## 2019-10-13 DIAGNOSIS — U071 COVID-19: Principal | ICD-10-CM

## 2019-10-13 DIAGNOSIS — Z96641 Presence of right artificial hip joint: Secondary | ICD-10-CM | POA: Diagnosis present

## 2019-10-13 DIAGNOSIS — R112 Nausea with vomiting, unspecified: Secondary | ICD-10-CM

## 2019-10-13 HISTORY — DX: Chronic kidney disease, unspecified: N18.9

## 2019-10-13 HISTORY — DX: Peripheral vascular disease, unspecified: I73.9

## 2019-10-13 HISTORY — DX: Pneumonia, unspecified organism: J18.9

## 2019-10-13 LAB — CBC WITH DIFFERENTIAL/PLATELET
Abs Immature Granulocytes: 0.04 10*3/uL (ref 0.00–0.07)
Basophils Absolute: 0 10*3/uL (ref 0.0–0.1)
Basophils Relative: 0 %
Eosinophils Absolute: 0 10*3/uL (ref 0.0–0.5)
Eosinophils Relative: 0 %
HCT: 38.6 % — ABNORMAL LOW (ref 39.0–52.0)
Hemoglobin: 12.8 g/dL — ABNORMAL LOW (ref 13.0–17.0)
Immature Granulocytes: 1 %
Lymphocytes Relative: 24 %
Lymphs Abs: 1.1 10*3/uL (ref 0.7–4.0)
MCH: 31.4 pg (ref 26.0–34.0)
MCHC: 33.2 g/dL (ref 30.0–36.0)
MCV: 94.6 fL (ref 80.0–100.0)
Monocytes Absolute: 0.4 10*3/uL (ref 0.1–1.0)
Monocytes Relative: 9 %
Neutro Abs: 3.1 10*3/uL (ref 1.7–7.7)
Neutrophils Relative %: 66 %
Platelets: 133 10*3/uL — ABNORMAL LOW (ref 150–400)
RBC: 4.08 MIL/uL — ABNORMAL LOW (ref 4.22–5.81)
RDW: 12.4 % (ref 11.5–15.5)
WBC: 4.7 10*3/uL (ref 4.0–10.5)
nRBC: 0 % (ref 0.0–0.2)

## 2019-10-13 LAB — COMPREHENSIVE METABOLIC PANEL
ALT: 32 U/L (ref 0–44)
AST: 45 U/L — ABNORMAL HIGH (ref 15–41)
Albumin: 3 g/dL — ABNORMAL LOW (ref 3.5–5.0)
Alkaline Phosphatase: 80 U/L (ref 38–126)
Anion gap: 13 (ref 5–15)
BUN: 40 mg/dL — ABNORMAL HIGH (ref 6–20)
CO2: 20 mmol/L — ABNORMAL LOW (ref 22–32)
Calcium: 8.1 mg/dL — ABNORMAL LOW (ref 8.9–10.3)
Chloride: 98 mmol/L (ref 98–111)
Creatinine, Ser: 2.56 mg/dL — ABNORMAL HIGH (ref 0.61–1.24)
GFR calc Af Amer: 33 mL/min — ABNORMAL LOW (ref >60)
GFR calc non Af Amer: 29 mL/min — ABNORMAL LOW (ref >60)
Glucose, Bld: 459 mg/dL — ABNORMAL HIGH (ref 70–99)
Potassium: 3.7 mmol/L (ref 3.5–5.1)
Sodium: 131 mmol/L — ABNORMAL LOW (ref 135–145)
Total Bilirubin: 0.9 mg/dL (ref 0.3–1.2)
Total Protein: 7.9 g/dL (ref 6.5–8.1)

## 2019-10-13 LAB — ABO/RH: ABO/RH(D): AB POS

## 2019-10-13 LAB — HIV ANTIBODY (ROUTINE TESTING W REFLEX): HIV Screen 4th Generation wRfx: NONREACTIVE

## 2019-10-13 LAB — HEMOGLOBIN A1C
Hgb A1c MFr Bld: 12.6 % — ABNORMAL HIGH (ref 4.8–5.6)
Mean Plasma Glucose: 314.92 mg/dL

## 2019-10-13 LAB — CBG MONITORING, ED
Glucose-Capillary: 430 mg/dL — ABNORMAL HIGH (ref 70–99)
Glucose-Capillary: 442 mg/dL — ABNORMAL HIGH (ref 70–99)

## 2019-10-13 MED ORDER — SODIUM CHLORIDE 0.9 % IV BOLUS
1000.0000 mL | Freq: Once | INTRAVENOUS | Status: AC
  Administered 2019-10-13: 1000 mL via INTRAVENOUS

## 2019-10-13 MED ORDER — ASCORBIC ACID 500 MG PO TABS
500.0000 mg | ORAL_TABLET | Freq: Every day | ORAL | Status: DC
  Administered 2019-10-13 – 2019-10-20 (×8): 500 mg via ORAL
  Filled 2019-10-13 (×9): qty 1

## 2019-10-13 MED ORDER — SENNOSIDES-DOCUSATE SODIUM 8.6-50 MG PO TABS
1.0000 | ORAL_TABLET | Freq: Two times a day (BID) | ORAL | Status: DC
  Filled 2019-10-13: qty 1

## 2019-10-13 MED ORDER — ATORVASTATIN CALCIUM 40 MG PO TABS
80.0000 mg | ORAL_TABLET | Freq: Every day | ORAL | Status: DC
  Administered 2019-10-14 – 2019-10-19 (×6): 80 mg via ORAL
  Filled 2019-10-13 (×6): qty 2

## 2019-10-13 MED ORDER — ONDANSETRON HCL 4 MG/2ML IJ SOLN
4.0000 mg | Freq: Once | INTRAMUSCULAR | Status: AC
  Administered 2019-10-13: 4 mg via INTRAVENOUS
  Filled 2019-10-13: qty 2

## 2019-10-13 MED ORDER — DEXAMETHASONE 6 MG PO TABS
6.0000 mg | ORAL_TABLET | Freq: Every day | ORAL | Status: DC
  Administered 2019-10-13 – 2019-10-20 (×8): 6 mg via ORAL
  Filled 2019-10-13 (×8): qty 1

## 2019-10-13 MED ORDER — INSULIN ASPART 100 UNIT/ML ~~LOC~~ SOLN
0.0000 [IU] | Freq: Three times a day (TID) | SUBCUTANEOUS | Status: DC
  Administered 2019-10-14: 11 [IU] via SUBCUTANEOUS
  Administered 2019-10-14: 15 [IU] via SUBCUTANEOUS
  Filled 2019-10-13: qty 0.15

## 2019-10-13 MED ORDER — SODIUM CHLORIDE 0.9 % IV SOLN
200.0000 mg | Freq: Once | INTRAVENOUS | Status: DC
  Filled 2019-10-13: qty 40

## 2019-10-13 MED ORDER — SODIUM CHLORIDE 0.9 % IV SOLN
100.0000 mg | Freq: Every day | INTRAVENOUS | Status: AC
  Administered 2019-10-14 – 2019-10-17 (×4): 100 mg via INTRAVENOUS
  Filled 2019-10-13 (×4): qty 20

## 2019-10-13 MED ORDER — ASPIRIN EC 81 MG PO TBEC
81.0000 mg | DELAYED_RELEASE_TABLET | Freq: Every day | ORAL | Status: DC
  Administered 2019-10-14 – 2019-10-20 (×7): 81 mg via ORAL
  Filled 2019-10-13 (×7): qty 1

## 2019-10-13 MED ORDER — SODIUM CHLORIDE 0.9 % IV SOLN: INTRAVENOUS | Status: AC

## 2019-10-13 MED ORDER — HEPARIN SODIUM (PORCINE) 5000 UNIT/ML IJ SOLN
5000.0000 [IU] | Freq: Three times a day (TID) | INTRAMUSCULAR | Status: DC
  Administered 2019-10-13 – 2019-10-20 (×21): 5000 [IU] via SUBCUTANEOUS
  Filled 2019-10-13 (×21): qty 1

## 2019-10-13 MED ORDER — SODIUM CHLORIDE 0.9 % IV SOLN: 100.0000 mg | Freq: Every day | INTRAVENOUS | Status: DC

## 2019-10-13 MED ORDER — ZINC SULFATE 220 (50 ZN) MG PO CAPS
220.0000 mg | ORAL_CAPSULE | Freq: Every day | ORAL | Status: DC
  Administered 2019-10-13 – 2019-10-20 (×8): 220 mg via ORAL
  Filled 2019-10-13 (×8): qty 1

## 2019-10-13 MED ORDER — INSULIN GLARGINE 100 UNIT/ML ~~LOC~~ SOLN
35.0000 [IU] | Freq: Every day | SUBCUTANEOUS | Status: DC
  Administered 2019-10-14: 35 [IU] via SUBCUTANEOUS
  Filled 2019-10-13: qty 0.35

## 2019-10-13 MED ORDER — SODIUM CHLORIDE 0.9 % IV SOLN
200.0000 mg | Freq: Once | INTRAVENOUS | Status: AC
  Administered 2019-10-13: 200 mg via INTRAVENOUS
  Filled 2019-10-13: qty 40

## 2019-10-13 MED ORDER — CLOPIDOGREL BISULFATE 75 MG PO TABS
75.0000 mg | ORAL_TABLET | Freq: Every day | ORAL | Status: DC
  Administered 2019-10-14 – 2019-10-20 (×7): 75 mg via ORAL
  Filled 2019-10-13 (×8): qty 1

## 2019-10-13 MED ORDER — GUAIFENESIN-DM 100-10 MG/5ML PO SYRP: 10.0000 mL | ORAL_SOLUTION | ORAL | Status: DC | PRN

## 2019-10-13 MED ORDER — INSULIN ASPART 100 UNIT/ML ~~LOC~~ SOLN
0.0000 [IU] | Freq: Every day | SUBCUTANEOUS | Status: DC
  Filled 2019-10-13: qty 0.05

## 2019-10-13 NOTE — ED Triage Notes (Signed)
Transported by [facility] from home-- diagnosed with COVID on 1/29 +body aches/fever and weakness. CBG read 438 mg/dl. EMS gave 1000 mg of Tylenol prior to arrival. VSS.

## 2019-10-13 NOTE — ED Provider Notes (Signed)
[facility] DEPT Provider Note   CSN: [id number] Arrival date & time: 10/13/19  1411     History Chief Complaint  Patient presents with  . Weakness    Howard Mcmahon is a 49 y.o. male with a past medical history of hypertension, diabetes, who presents to ED with a chief complaint of generalized weakness, vomiting.  He was diagnosed with COVID-19 infection on 10/10/2019.  That was the first day he began having symptoms.  He is concerned that his blood sugars are running high as he has had continued nonbloody, nonbilious emesis that has worsened today.  He denies any chest pain or abdominal pain but does endorse diarrhea.  He has had chills but denies any cough.  He has not been taking any over-the-counter medications to help with the symptoms because of his vomiting.  He denies any loss of consciousness, urinary symptoms, hemoptysis, vision changes.  HPI     Past Medical History:  Diagnosis Date  . Diabetes mellitus without complication ([place])   . Hypertension   . Macular infarction    2012  . [facility]     Patient Active Problem List   Diagnosis Date Noted  . Left sided numbness   . Fever   . HCAP (healthcare-associated pneumonia)   . Blood poisoning   . Diabetic ketoacidosis without coma associated with diabetes mellitus due to underlying condition ([place])   . SVT (supraventricular tachycardia) ([place])   . HHNC (hyperglycemic hyperosmolar nonketotic coma) ([place])   . Essential hypertension   . Pain in the chest   . Coronary artery disease with unspecified angina pectoris   . HLD (hyperlipidemia)   . Leukocytosis   . Numbness and tingling in left hand   . Noncompliance with medication regimen   . Chest pain 08/26/2014  . Paresthesia of left arm 08/26/2014  . Hyperglycemia due to type 2 diabetes mellitus ([place]) 08/26/2014  . DM (diabetes mellitus) ([place]) 01/25/2014  . DKA (diabetic ketoacidoses) ([place]) 01/09/2014  . Type II or  unspecified type diabetes mellitus with unspecified complication, uncontrolled 01/09/2014  . Essential hypertension, benign 01/09/2014  . CAD (coronary artery disease) 01/09/2014  . Fever presenting with conditions classified elsewhere 01/09/2014  . Leukocytosis, unspecified 01/09/2014  . Elevated lactic acid level 01/09/2014    Past Surgical History:  Procedure Laterality Date  . CORONARY ANGIOPLASTY WITH STENT PLACEMENT    . HIP ARTHROPLASTY Right        Family History  Problem Relation Age of Onset  . Stroke Mother     Social History   Tobacco Use  . Smoking status: Current Every Day Smoker    Packs/day: 2.00    Years: 30.00    Pack years: 60.00    Types: Cigarettes  . Smokeless tobacco: Current User  Substance Use Topics  . Alcohol use: No  . Drug use: No    Home Medications Prior to Admission medications   Medication Sig Start Date End Date Taking? Authorizing Provider  aspirin EC 81 MG tablet Take 81 mg by mouth daily.    [provider]  atorvastatin (LIPITOR) 80 MG tablet Take 1 tablet (80 mg total) by mouth daily for 30 days. 01/05/19 02/04/19  Kerin Perna, NP  Blood Glucose Monitoring Suppl (TRUE METRIX METER) w/Device KIT Use as directed 01/02/19   Kerin Perna, NP  clopidogrel (PLAVIX) 75 MG tablet Take 1 tablet (75 mg total) by mouth daily with breakfast. 10/10/19   Charlann Lange,  PA-C  glucose blood (TRUE METRIX BLOOD GLUCOSE TEST) test strip Check blood sugar fasting and before meals and again if pt feels bad (symptoms of hypo). Check sugar three times a day 10/10/19   Charlann Lange, PA-C  insulin glargine (LANTUS) 100 UNIT/ML injection Inject 0.62 mLs (62 Units total) into the skin at bedtime. 10/10/19   Charlann Lange, PA-C  Insulin Syringe-Needle U-100 (BD INSULIN SYRINGE ULTRAFINE) 31G X 15/64" 1 ML MISC Take 42 units every night after your protein snack at bedtime 10/10/19   Charlann Lange, PA-C  lisinopril (ZESTRIL) 20 MG tablet  Take 2 tablets (40 mg total) by mouth 2 (two) times daily. 10/10/19   Charlann Lange, PA-C  metFORMIN (GLUCOPHAGE-XR) 500 MG 24 hr tablet Take one daily 10/10/19   Charlann Lange, PA-C  metoprolol tartrate (LOPRESSOR) 12.5 mg TABS tablet Take 0.5 tablets (12.5 mg total) by mouth 2 (two) times daily. Patient not taking: Reported on 01/02/2019 08/31/14   Allie Bossier, MD  NEEDLE, DISP, 25 G 25G X 1" MISC 32 Units by Does not apply route 2 (two) times daily. Patient not taking: Reported on 12/28/2018 08/31/14   Allie Bossier, MD  nicotine (NICODERM CQ - DOSED IN MG/24 HOURS) 14 mg/24hr patch Place 1 patch (14 mg total) onto the skin daily. Patient not taking: Reported on 12/28/2018 08/31/14   Allie Bossier, MD  omega-3 acid ethyl esters (LOVAZA) 1 g capsule Take 2 capsules (2 g total) by mouth 2 (two) times daily. 01/05/19   Kerin Perna, NP  oxyCODONE (OXY IR/ROXICODONE) 5 MG immediate release tablet Take 1 tablet (5 mg total) by mouth every 4 (four) hours as needed for moderate pain. Patient not taking: Reported on 12/28/2018 01/12/14   Theodis Blaze, MD  senna-docusate (SENOKOT-S) 8.6-50 MG per tablet Take 1 tablet by mouth 2 (two) times daily. Patient not taking: Reported on 12/28/2018 01/12/14   Theodis Blaze, MD  TRUEplus Lancets 28G MISC Check blood sugar fasting and before meals and again if pt feels bad (symptoms of hypo). Check sugar three times a day 10/10/19   Charlann Lange, PA-C    Allergies    Patient has no known allergies.  Review of Systems   Review of Systems  Constitutional: Positive for appetite change and fatigue. Negative for chills and fever.  HENT: Negative for ear pain, rhinorrhea, sneezing and sore throat.   Eyes: Negative for photophobia and visual disturbance.  Respiratory: Negative for cough, chest tightness, shortness of breath and wheezing.   Cardiovascular: Negative for chest pain and palpitations.  Gastrointestinal: Positive for diarrhea and vomiting.  Negative for abdominal pain, blood in stool, constipation and nausea.  Genitourinary: Negative for dysuria, hematuria and urgency.  Musculoskeletal: Positive for myalgias.  Skin: Negative for rash.  Neurological: Negative for dizziness, weakness and light-headedness.    Physical Exam Updated Vital Signs BP 106/86 (BP Location: Left Arm)   Pulse 100   Temp 100 F (37.8 C) (Oral)   Resp 18   Ht '6\' 4"'$  (1.93 m)   Wt 117.9 kg   SpO2 98%   BMI 31.65 kg/m   Physical Exam Vitals and nursing note reviewed.  Constitutional:      General: He is not in acute distress.    Appearance: He is well-developed. He is obese.  HENT:     Head: Normocephalic and atraumatic.     Nose: Nose normal.  Eyes:     General: No scleral icterus.  Right eye: No discharge.        Left eye: No discharge.     Conjunctiva/sclera: Conjunctivae normal.  Cardiovascular:     Rate and Rhythm: Normal rate and regular rhythm.     Heart sounds: Normal heart sounds. No murmur. No friction rub. No gallop.   Pulmonary:     Effort: Pulmonary effort is normal. No respiratory distress.     Breath sounds: Normal breath sounds.  Abdominal:     General: Bowel sounds are normal. There is no distension.     Palpations: Abdomen is soft.     Tenderness: There is no abdominal tenderness. There is no guarding.  Musculoskeletal:        General: Normal range of motion.     Cervical back: Normal range of motion and neck supple.  Skin:    General: Skin is warm and dry.     Findings: No rash.  Neurological:     Mental Status: He is alert.     Motor: No abnormal muscle tone.     Coordination: Coordination normal.     ED Results / Procedures / Treatments   Labs (all labs ordered are listed, but only abnormal results are displayed) Labs Reviewed  COMPREHENSIVE METABOLIC PANEL - Abnormal; Notable for the following components:      Result Value   Sodium 131 (*)    CO2 20 (*)    Glucose, Bld 459 (*)    BUN 40 (*)     Creatinine, Ser 2.56 (*)    Calcium 8.1 (*)    Albumin 3.0 (*)    AST 45 (*)    GFR calc non Af Amer 29 (*)    GFR calc Af Amer 33 (*)    All other components within normal limits  CBC WITH DIFFERENTIAL/PLATELET - Abnormal; Notable for the following components:   RBC 4.08 (*)    Hemoglobin 12.8 (*)    HCT 38.6 (*)    Platelets 133 (*)    All other components within normal limits  CBG MONITORING, ED - Abnormal; Notable for the following components:   Glucose-Capillary 442 (*)    All other components within normal limits  URINALYSIS, ROUTINE W REFLEX MICROSCOPIC    EKG None  Radiology DG Chest Portable 1 View  Result Date: 10/13/2019 CLINICAL DATA:  Fever and weakness. EXAM: PORTABLE CHEST 1 VIEW COMPARISON:  October 09, 2019 FINDINGS: Multiple sternal wires are seen. Mild patchy infiltrate is seen within the right lung base. This represents a new finding when compared to the prior study. There is no evidence of a pleural effusion or pneumothorax. The heart size and mediastinal contours are within normal limits. Multilevel degenerative changes seen throughout the thoracic spine. IMPRESSION: 1. Interval development of mild patchy right basilar infiltrate since the prior study dated October 09, 2019. Electronically Signed   By: Virgina Norfolk M.D.   On: 10/13/2019 15:48    Procedures Procedures (including critical care time)  Medications Ordered in ED Medications  ondansetron (ZOFRAN) injection 4 mg (has no administration in time range)  sodium chloride 0.9 % bolus 1,000 mL (1,000 mLs Intravenous New Bag/Given 10/13/19 1554)    ED Course  I have reviewed the triage vital signs and the nursing notes.  Pertinent labs & imaging results that were available during my care of the patient were reviewed by me and considered in my medical decision making (see chart for details).    MDM Rules/Calculators/A&P  Howard Mcmahon was evaluated in Emergency Department on  10/13/19 for the symptoms described in the history of present illness. He/she was evaluated in the context of the global COVID-19 pandemic, which necessitated consideration that the patient might be at risk for infection with the SARS-CoV-2 virus that causes COVID-19. Institutional protocols and algorithms that pertain to the evaluation of patients at risk for COVID-19 are in a state of rapid change based on information released by regulatory bodies including the CDC and federal and state organizations. These policies and algorithms were followed during the patient's care in the ED.  49 year old male with a past medical history of IDDM presenting to the ED for generalized weakness, vomiting, hyperglycemia after recent COVID-19 infection diagnosed 3 days ago.  He denies any abdominal pain, chest pain.  His oxygen saturations are greater than 95% on room air and lungs are clear to auscultation.  Work-up here significant for AKI with creatinine of 2.5 which is elevated from 1.5 on 10/10/2019.  CBG is 442.  Chest x-ray with patchy infiltrates that has progressed since his positive Covid test 3 days ago.  Patient will need to be admitted for his AKI, hyperglycemia management of his symptoms.  Final Clinical Impression(s) / ED Diagnoses Final diagnoses:  AKI (acute kidney injury) ([place])  Hyperglycemia  COVID-19 virus infection    Rx / DC Orders ED Discharge Orders    None      Portions of this note were generated with Dragon dictation software. Dictation errors may occur despite best attempts at proofreading.    Delia Heady, PA-C 10/13/19 1652    Isla Pence, MD 10/13/19 2128

## 2019-10-13 NOTE — ED Notes (Signed)
Pt. Documented in error see above note in chart. 

## 2019-10-13 NOTE — ED Notes (Signed)
[facility] called for pt transfer to [facility]

## 2019-10-13 NOTE — ED Notes (Signed)
Report [facility] for pt [facility].

## 2019-10-13 NOTE — H&P (Signed)
History and Physical    Decklin Weddington QZR:[id number] DOB: 07-03-1971 DOA: 10/13/2019  PCP: Kerin Perna, NP   Patient coming from: Home    Chief Complaint: Weakness  HPI: Howard Mcmahon is a 49 y.o. male with medical history significant of diabetes, hypertension,CAD on presents to the emergency department with complaints of generalized weakness, vomiting, poor oral intake.  He was diagnosed with Covid test 19 infection on 10/10/2019.  He also noted that his blood sugars were running high.  He was having several episodes of vomiting, severe generalized weakness and diarrhoea.  He was not able to tolerate anything by mouth.  He presented to the emergency department today. On presentation, he was found to be dehydrated.  He was not in respiratory distress and was saturating fine on room air.  Patient seen and examined at the bedside.  He denied  any fever, chills, shortness of breath, chest pain, abdominal pain. He lives with his girlfriend.  ED Course: Found to be significantly dehydrated with creatinine of 2.5.  He was saturating fine on room air.  Chest x-ray showed left lower lobe infiltrate.  Started on IV fluids, Decadron, remdesivir.  Review of Systems: As per HPI otherwise 10 point review of systems negative.    Past Medical History:  Diagnosis Date  . Diabetes mellitus without complication ([place])   . Hypertension   . Macular infarction    2012  . MI (myocardial infarction) [facility])     Past Surgical History:  Procedure Laterality Date  . CORONARY ANGIOPLASTY WITH STENT PLACEMENT    . HIP ARTHROPLASTY Right      reports that he has been smoking cigarettes. He has a 60.00 pack-year smoking history. He uses smokeless tobacco. He reports that he does not drink alcohol or use drugs.  No Known Allergies  Family History  Problem Relation Age of Onset  . Stroke Mother      Prior to Admission medications   Medication Sig Start Date End Date Taking? Authorizing Provider    aspirin EC 81 MG tablet Take 81 mg by mouth daily.    [provider]  atorvastatin (LIPITOR) 80 MG tablet Take 1 tablet (80 mg total) by mouth daily for 30 days. 01/05/19 02/04/19  Kerin Perna, NP  Blood Glucose Monitoring Suppl (TRUE METRIX METER) w/Device KIT Use as directed 01/02/19   Kerin Perna, NP  clopidogrel (PLAVIX) 75 MG tablet Take 1 tablet (75 mg total) by mouth daily with breakfast. 10/10/19   Charlann Lange, PA-C  glucose blood (TRUE METRIX BLOOD GLUCOSE TEST) test strip Check blood sugar fasting and before meals and again if pt feels bad (symptoms of hypo). Check sugar three times a day 10/10/19   Charlann Lange, PA-C  insulin glargine (LANTUS) 100 UNIT/ML injection Inject 0.62 mLs (62 Units total) into the skin at bedtime. 10/10/19   Charlann Lange, PA-C  Insulin Syringe-Needle U-100 (BD INSULIN SYRINGE ULTRAFINE) 31G X 15/64" 1 ML MISC Take 42 units every night after your protein snack at bedtime 10/10/19   Charlann Lange, PA-C  lisinopril (ZESTRIL) 20 MG tablet Take 2 tablets (40 mg total) by mouth 2 (two) times daily. 10/10/19   Charlann Lange, PA-C  metFORMIN (GLUCOPHAGE-XR) 500 MG 24 hr tablet Take one daily 10/10/19   Charlann Lange, PA-C  metoprolol tartrate (LOPRESSOR) 12.5 mg TABS tablet Take 0.5 tablets (12.5 mg total) by mouth 2 (two) times daily. Patient not taking: Reported on 01/02/2019 08/31/14   Allie Bossier, MD  NEEDLE, DISP, 25 G 25G X 1" MISC 32 Units by Does not apply route 2 (two) times daily. Patient not taking: Reported on 12/28/2018 08/31/14   Allie Bossier, MD  nicotine (NICODERM CQ - DOSED IN MG/24 HOURS) 14 mg/24hr patch Place 1 patch (14 mg total) onto the skin daily. Patient not taking: Reported on 12/28/2018 08/31/14   Allie Bossier, MD  omega-3 acid ethyl esters (LOVAZA) 1 g capsule Take 2 capsules (2 g total) by mouth 2 (two) times daily. 01/05/19   Kerin Perna, NP  oxyCODONE (OXY IR/ROXICODONE) 5 MG immediate release  tablet Take 1 tablet (5 mg total) by mouth every 4 (four) hours as needed for moderate pain. Patient not taking: Reported on 12/28/2018 01/12/14   Theodis Blaze, MD  senna-docusate (SENOKOT-S) 8.6-50 MG per tablet Take 1 tablet by mouth 2 (two) times daily. Patient not taking: Reported on 12/28/2018 01/12/14   Theodis Blaze, MD  TRUEplus Lancets 28G MISC Check blood sugar fasting and before meals and again if pt feels bad (symptoms of hypo). Check sugar three times a day 10/10/19   Charlann Lange, PA-C    Physical Exam: Vitals:   10/13/19 1440 10/13/19 1600 10/13/19 1722  BP: 106/86 110/78 99/75  Pulse: 100 86 88  Resp: 18 17 (!) 23  Temp: 100 F (37.8 C)    TempSrc: Oral    SpO2: 98% 90% 95%  Weight: 117.9 kg    Height: '6\' 4"'$  (1.93 m)      Constitutional: Generalized weakness, looks older than age,obese Vitals:   10/13/19 1440 10/13/19 1600 10/13/19 1722  BP: 106/86 110/78 99/75  Pulse: 100 86 88  Resp: 18 17 (!) 23  Temp: 100 F (37.8 C)    TempSrc: Oral    SpO2: 98% 90% 95%  Weight: 117.9 kg    Height: '6\' 4"'$  (1.93 m)     Eyes: PERRL, lids and conjunctivae normal ENMT: Mucous membranes are dry Neck: normal, supple, no masses, no thyromegaly Respiratory: clear to auscultation bilaterally, no wheezing, no crackles. Normal respiratory effort. No accessory muscle use.  Cardiovascular: Regular rate and rhythm, no murmurs / rubs / gallops. No extremity edema.  Abdomen: no tenderness, no masses palpated. No hepatosplenomegaly. Bowel sounds positive.  Musculoskeletal: no clubbing / cyanosis. No joint deformity upper and lower extremities.  Skin: no rashes, lesions, ulcers. No induration Neurologic: CN 2-12 grossly intact.  Strength 5/5 in all 4.  Psychiatric: Normal judgment and insight. Alert and oriented x 3. Normal mood.   Foley Catheter:None  Labs on Admission: I have personally reviewed following labs and imaging studies  CBC: Recent Labs  Lab 10/09/19 2242  10/13/19 1525  WBC 8.1 4.7  NEUTROABS 6.3 3.1  HGB 11.9* 12.8*  HCT 35.7* 38.6*  MCV 94.2 94.6  PLT 187 161*   Basic Metabolic Panel: Recent Labs  Lab 10/09/19 2242 10/13/19 1525  NA 138 131*  K 3.9 3.7  CL 106 98  CO2 22 20*  GLUCOSE 446* 459*  BUN 15 40*  CREATININE 1.59* 2.56*  CALCIUM 8.8* 8.1*   GFR: Estimated Creatinine Clearance: 50.1 mL/min (A) (by C-G formula based on SCr of 2.56 mg/dL (H)). Liver Function Tests: Recent Labs  Lab 10/09/19 2242 10/13/19 1525  AST 24 45*  ALT 22 32  ALKPHOS 101 80  BILITOT 0.5 0.9  PROT 6.8 7.9  ALBUMIN 2.7* 3.0*   No results for input(s): LIPASE, AMYLASE in the last 168 hours. No results  for input(s): AMMONIA in the last 168 hours. Coagulation Profile: No results for input(s): INR, PROTIME in the last 168 hours. Cardiac Enzymes: No results for input(s): CKTOTAL, CKMB, CKMBINDEX, TROPONINI in the last 168 hours. BNP (last 3 results) No results for input(s): PROBNP in the last 8760 hours. HbA1C: No results for input(s): HGBA1C in the last 72 hours. CBG: Recent Labs  Lab 10/09/19 2233 10/10/19 0254 10/13/19 1436  GLUCAP 452* 230* 442*   Lipid Profile: No results for input(s): CHOL, HDL, LDLCALC, TRIG, CHOLHDL, LDLDIRECT in the last 72 hours. Thyroid Function Tests: No results for input(s): TSH, T4TOTAL, FREET4, T3FREE, THYROIDAB in the last 72 hours. Anemia Panel: No results for input(s): VITAMINB12, FOLATE, FERRITIN, TIBC, IRON, RETICCTPCT in the last 72 hours. Urine analysis:    Component Value Date/Time   COLORURINE STRAW (A) 10/10/2019 0056   APPEARANCEUR CLEAR 10/10/2019 0056   LABSPEC 1.017 10/10/2019 0056   PHURINE 7.0 10/10/2019 0056   GLUCOSEU >=500 (A) 10/10/2019 0056   HGBUR MODERATE (A) 10/10/2019 0056   BILIRUBINUR NEGATIVE 10/10/2019 0056   BILIRUBINUR negative 01/02/2019 1136   KETONESUR NEGATIVE 10/10/2019 0056   PROTEINUR 100 (A) 10/10/2019 0056   UROBILINOGEN 1.0 01/02/2019 1136    UROBILINOGEN 1.0 08/26/2014 0455   NITRITE NEGATIVE 10/10/2019 0056   LEUKOCYTESUR NEGATIVE 10/10/2019 0056    Radiological Exams on Admission: DG Chest Portable 1 View  Result Date: 10/13/2019 CLINICAL DATA:  Fever and weakness. EXAM: PORTABLE CHEST 1 VIEW COMPARISON:  October 09, 2019 FINDINGS: Multiple sternal wires are seen. Mild patchy infiltrate is seen within the right lung base. This represents a new finding when compared to the prior study. There is no evidence of a pleural effusion or pneumothorax. The heart size and mediastinal contours are within normal limits. Multilevel degenerative changes seen throughout the thoracic spine. IMPRESSION: 1. Interval development of mild patchy right basilar infiltrate since the prior study dated October 09, 2019. Electronically Signed   By: Virgina Norfolk M.D.   On: 10/13/2019 15:48     Assessment/Plan Principal Problem:   AKI (acute kidney injury) ([place]) Active Problems:   Essential hypertension, benign   CAD (coronary artery disease)   Essential hypertension   COVID-19 virus infection   Generalized weakness    AKI: Dehydrated.  Multiple episodes of vomiting at home with diarrhowa.  Continue IV fluids.  Monitor BMP.  His creatinine was 1.4 on 4/20, he might have a stage II CKD.  Covid- 19 illness: Not hypoxic currently.  Chest x-ray showed right lower lobe infiltrate.  Will start on Decadron and remdesivir. Monitor inflammatory markers.  Hyponatremia: Sodium of 131, most likely asscoiated  with dehydration.  Continue IV fluids.  Diabetes type 2/hyperglycemia: On insulin at home.  Monitor blood sugars.  Continue Lantus and sliding scale.  Check hemoglobin A1c.  Hyperlipidemia: Continue statin  History of coronary artery disease: Denies any chest pain.  Continue home medications.  Takes aspirin, Plavix.  History of triple bypass.  Hypertension: Currently blood pressure stable.  On lisinopril at home.  Will hold lisinopril for  AKI.    Severity of Illness: The appropriate patient status for this patient is INPATIENT.   DVT prophylaxis: Heparin [place] Code Status: Full Family Communication: None present at the bedside Consults called: None     Shelly Coss MD [facility]  10/13/2019, 6:47 PM

## 2019-10-14 ENCOUNTER — Other Ambulatory Visit: Payer: Self-pay

## 2019-10-14 ENCOUNTER — Encounter (HOSPITAL_COMMUNITY): Payer: Self-pay | Admitting: Internal Medicine

## 2019-10-14 DIAGNOSIS — U071 COVID-19: Principal | ICD-10-CM

## 2019-10-14 DIAGNOSIS — E1165 Type 2 diabetes mellitus with hyperglycemia: Secondary | ICD-10-CM

## 2019-10-14 DIAGNOSIS — J1282 Pneumonia due to Coronavirus disease 2019: Secondary | ICD-10-CM

## 2019-10-14 DIAGNOSIS — I2510 Atherosclerotic heart disease of native coronary artery without angina pectoris: Secondary | ICD-10-CM

## 2019-10-14 LAB — URINALYSIS, ROUTINE W REFLEX MICROSCOPIC
Bacteria, UA: NONE SEEN
Bilirubin Urine: NEGATIVE
Glucose, UA: >=500 mg/dL — AB
Ketones, ur: NEGATIVE mg/dL
Leukocytes,Ua: NEGATIVE
Nitrite: NEGATIVE
Protein, ur: >=300 mg/dL — AB
Specific Gravity, Urine: 1.015 (ref 1.005–1.030)
pH: 5 (ref 5.0–8.0)

## 2019-10-14 LAB — CBC WITH DIFFERENTIAL/PLATELET
Abs Immature Granulocytes: 0.02 10*3/uL (ref 0.00–0.07)
Basophils Absolute: 0 10*3/uL (ref 0.0–0.1)
Basophils Relative: 0 %
Eosinophils Absolute: 0 10*3/uL (ref 0.0–0.5)
Eosinophils Relative: 0 %
HCT: 35.2 % — ABNORMAL LOW (ref 39.0–52.0)
Hemoglobin: 11.8 g/dL — ABNORMAL LOW (ref 13.0–17.0)
Immature Granulocytes: 1 %
Lymphocytes Relative: 20 %
Lymphs Abs: 0.5 10*3/uL — ABNORMAL LOW (ref 0.7–4.0)
MCH: 31.4 pg (ref 26.0–34.0)
MCHC: 33.5 g/dL (ref 30.0–36.0)
MCV: 93.6 fL (ref 80.0–100.0)
Monocytes Absolute: 0.2 10*3/uL (ref 0.1–1.0)
Monocytes Relative: 9 %
Neutro Abs: 1.9 10*3/uL (ref 1.7–7.7)
Neutrophils Relative %: 70 %
Platelets: 136 10*3/uL — ABNORMAL LOW (ref 150–400)
RBC: 3.76 MIL/uL — ABNORMAL LOW (ref 4.22–5.81)
RDW: 12.5 % (ref 11.5–15.5)
WBC: 2.7 10*3/uL — ABNORMAL LOW (ref 4.0–10.5)
nRBC: 0 % (ref 0.0–0.2)

## 2019-10-14 LAB — SAMPLE TO BLOOD BANK

## 2019-10-14 LAB — D-DIMER, QUANTITATIVE: D-Dimer, Quant: 1.23 ug/mL-FEU — ABNORMAL HIGH (ref 0.00–0.50)

## 2019-10-14 LAB — COMPREHENSIVE METABOLIC PANEL
ALT: 29 U/L (ref 0–44)
AST: 38 U/L (ref 15–41)
Albumin: 2.5 g/dL — ABNORMAL LOW (ref 3.5–5.0)
Alkaline Phosphatase: 78 U/L (ref 38–126)
Anion gap: 12 (ref 5–15)
BUN: 40 mg/dL — ABNORMAL HIGH (ref 6–20)
CO2: 17 mmol/L — ABNORMAL LOW (ref 22–32)
Calcium: 7.7 mg/dL — ABNORMAL LOW (ref 8.9–10.3)
Chloride: 103 mmol/L (ref 98–111)
Creatinine, Ser: 2.26 mg/dL — ABNORMAL HIGH (ref 0.61–1.24)
GFR calc Af Amer: 39 mL/min — ABNORMAL LOW (ref >60)
GFR calc non Af Amer: 33 mL/min — ABNORMAL LOW (ref >60)
Glucose, Bld: 434 mg/dL — ABNORMAL HIGH (ref 70–99)
Potassium: 4.3 mmol/L (ref 3.5–5.1)
Sodium: 132 mmol/L — ABNORMAL LOW (ref 135–145)
Total Bilirubin: 0.2 mg/dL — ABNORMAL LOW (ref 0.3–1.2)
Total Protein: 7.1 g/dL (ref 6.5–8.1)

## 2019-10-14 LAB — GLUCOSE, CAPILLARY
Glucose-Capillary: 380 mg/dL — ABNORMAL HIGH (ref 70–99)
Glucose-Capillary: 347 mg/dL — ABNORMAL HIGH (ref 70–99)
Glucose-Capillary: 293 mg/dL — ABNORMAL HIGH (ref 70–99)
Glucose-Capillary: 321 mg/dL — ABNORMAL HIGH (ref 70–99)

## 2019-10-14 LAB — C-REACTIVE PROTEIN: CRP: 7.7 mg/dL — ABNORMAL HIGH (ref <1.0)

## 2019-10-14 LAB — FERRITIN: Ferritin: 4067 ng/mL — ABNORMAL HIGH (ref 24–336)

## 2019-10-14 MED ORDER — INSULIN ASPART 100 UNIT/ML ~~LOC~~ SOLN
0.0000 [IU] | Freq: Every day | SUBCUTANEOUS | Status: DC
  Administered 2019-10-14 – 2019-10-15 (×2): 4 [IU] via SUBCUTANEOUS
  Administered 2019-10-16 – 2019-10-17 (×2): 2 [IU] via SUBCUTANEOUS
  Administered 2019-10-18: 3 [IU] via SUBCUTANEOUS
  Administered 2019-10-19: 2 [IU] via SUBCUTANEOUS

## 2019-10-14 MED ORDER — INSULIN ASPART 100 UNIT/ML ~~LOC~~ SOLN
6.0000 [IU] | Freq: Three times a day (TID) | SUBCUTANEOUS | Status: DC
  Administered 2019-10-14 – 2019-10-16 (×7): 6 [IU] via SUBCUTANEOUS

## 2019-10-14 MED ORDER — FAMOTIDINE 20 MG PO TABS
20.0000 mg | ORAL_TABLET | Freq: Every day | ORAL | Status: DC
  Administered 2019-10-14 – 2019-10-20 (×7): 20 mg via ORAL
  Filled 2019-10-14 (×7): qty 1

## 2019-10-14 MED ORDER — INSULIN ASPART 100 UNIT/ML ~~LOC~~ SOLN
14.0000 [IU] | Freq: Once | SUBCUTANEOUS | Status: AC
  Administered 2019-10-14: 14 [IU] via SUBCUTANEOUS
  Filled 2019-10-14: qty 0.14

## 2019-10-14 MED ORDER — INSULIN GLARGINE 100 UNIT/ML ~~LOC~~ SOLN
30.0000 [IU] | Freq: Two times a day (BID) | SUBCUTANEOUS | Status: DC
  Administered 2019-10-14 – 2019-10-17 (×7): 30 [IU] via SUBCUTANEOUS
  Filled 2019-10-14 (×8): qty 0.3

## 2019-10-14 MED ORDER — ACETAMINOPHEN 325 MG PO TABS
650.0000 mg | ORAL_TABLET | Freq: Four times a day (QID) | ORAL | Status: DC | PRN
  Administered 2019-10-14 – 2019-10-18 (×2): 650 mg via ORAL
  Filled 2019-10-14 (×2): qty 2

## 2019-10-14 MED ORDER — ENSURE MAX PROTEIN PO LIQD
11.0000 [oz_av] | Freq: Two times a day (BID) | ORAL | Status: DC
  Administered 2019-10-14 – 2019-10-20 (×7): 11 [oz_av] via ORAL
  Filled 2019-10-14 (×14): qty 330

## 2019-10-14 MED ORDER — INSULIN ASPART 100 UNIT/ML ~~LOC~~ SOLN
0.0000 [IU] | Freq: Three times a day (TID) | SUBCUTANEOUS | Status: DC
  Administered 2019-10-14: 11 [IU] via SUBCUTANEOUS
  Administered 2019-10-15: 7 [IU] via SUBCUTANEOUS
  Administered 2019-10-15: 11 [IU] via SUBCUTANEOUS
  Administered 2019-10-15: 15 [IU] via SUBCUTANEOUS
  Administered 2019-10-16: 4 [IU] via SUBCUTANEOUS
  Administered 2019-10-16: 7 [IU] via SUBCUTANEOUS
  Administered 2019-10-17: 11 [IU] via SUBCUTANEOUS
  Administered 2019-10-17: 4 [IU] via SUBCUTANEOUS
  Administered 2019-10-18: 3 [IU] via SUBCUTANEOUS
  Administered 2019-10-18: 15 [IU] via SUBCUTANEOUS
  Administered 2019-10-19: 11 [IU] via SUBCUTANEOUS
  Administered 2019-10-19: 4 [IU] via SUBCUTANEOUS
  Administered 2019-10-19: 3 [IU] via SUBCUTANEOUS

## 2019-10-14 NOTE — Progress Notes (Signed)
PROGRESS NOTE  Howard Mcmahon [id number] DOB: Apr 10, 1971 DOA: 10/13/2019  PCP: Patient, No Pcp Per  Brief History/Interval Summary: 49 y.o. male with medical history significant of diabetes, essential hypertension, CAD  that is post CABG in 2019, presented to the emergency department with complaints of generalized weakness, vomiting, poor oral intake.  He was diagnosed with Covid test 19 infection on 10/10/2019.  He also noted that his blood sugars were running high.  He was having several episodes of vomiting, severe generalized weakness and diarrhoea.  He was not able to tolerate anything by mouth.    He was found to be dehydrated.  Was hospitalized for further management.  Chest x-ray showed right lower lobe infiltrate.  Reason for Visit: Acute renal failure.  Consultants: None  Procedures: None  Antibiotics: Anti-infectives (From admission, onward)   Start     Dose/Rate Route Frequency Ordered Stop   10/14/19 1000  remdesivir 100 mg in sodium chloride 0.9 % 100 mL IVPB  Status:  Discontinued     100 mg 200 mL/hr over 30 Minutes Intravenous Daily 10/13/19 1832 10/13/19 1845   10/14/19 1000  remdesivir 100 mg in sodium chloride 0.9 % 100 mL IVPB     100 mg 200 mL/hr over 30 Minutes Intravenous Daily 10/13/19 1901 10/18/19 0959   10/13/19 2000  remdesivir 200 mg in sodium chloride 0.9% 250 mL IVPB  Status:  Discontinued     200 mg 580 mL/hr over 30 Minutes Intravenous Once 10/13/19 1832 10/13/19 1845   10/13/19 1915  remdesivir 200 mg in sodium chloride 0.9% 250 mL IVPB     200 mg 580 mL/hr over 30 Minutes Intravenous Once 10/13/19 1901 10/13/19 2355      Subjective/Interval History: Patient states that he is feeling better.  Has been urinating.  Denies any shortness of breath at rest.  No nausea or vomiting.  Has been having loose stools.    Assessment/Plan:  Pneumonia due to COVID-19   Recent Labs  Lab 10/09/19 2242 10/13/19 1525 10/14/19 0743  DDIMER  --   --  1.23*   FERRITIN  --   --  4,067*  CRP  --   --  7.7*  ALT 22 32 29    Objective findings: Fever: Noted to be afebrile. Oxygen requirements: Room air.  Saturating in the early 90s  COVID 19 Therapeutics: Antibacterials: None Remdesivir: Day 2 Steroids: Dexamethasone 6 mg daily Diuretics: Holding diuretics Actemra: Not given Convalescent Plasma: Not given yet Vitamin C and Zinc: Continue PUD Prophylaxis: Initiate Pepcid DVT Prophylaxis: Cutaneous heparin  From a respiratory standpoint patient is stable.  He saturating normal on room air.  Since x-ray did show infiltrate and saturations were less than 94% patient was started on remdesivir and steroids.  Continue for now.  His CRP is noted to be 7.7 this morning.  D-dimer 1.23.  Ferritin 4067.  We will recheck labs tomorrow morning.  Continue incentive spirometry, mobilization, out of bed to chair.   Acute kidney injury Patient presented with a creatinine of 2.56.  Baseline creatinine appears to be around 1.4-1.5.  Patient being hydrated.  Creatinine noted to be better this morning.  Monitor urine output.  Monitor electrolytes.  Avoid nephrotoxic agents.  Hyponatremia Likely due to dehydration.  Monitor closely.  Diabetes mellitus type 2, uncontrolled with hyperglycemia HbA1c 12.6 implying very poor control of diabetes.  Patient takes Lantus twice a day.  Compliance is questionable.  Elevated CBG also due to steroids.  Continue with  Lantus for now.  SSI.  Consider adding meal coverage.  Hyperlipidemia On statin.  History of coronary artery disease status post CABG Stable.  Continue with antiplatelet agents.  On aspirin and Plavix.  Patient on metoprolol at home.  Consider resuming.  Also on lisinopril which we will hold for now due to renal failure.  Continue statin.  Essential hypertension Continue to monitor blood pressures.  Leukopenia Due to COVID-19.  Normocytic anemia Likely anemia of chronic disease.  No evidence of overt  bleeding.  Continue to monitor.   DVT Prophylaxis: Subcutaneous heparin Code Status: Full code Family Communication: Discussed with patient. Disposition Plan: Management as outlined above.  Await improvement in renal function.  Mobilize.   Medications:  Scheduled: . vitamin C  500 mg Oral Daily  . aspirin EC  81 mg Oral Daily  . atorvastatin  80 mg Oral q1800  . clopidogrel  75 mg Oral Q breakfast  . dexamethasone  6 mg Oral Daily  . heparin  5,000 Units Subcutaneous Q8H  . insulin aspart  0-15 Units Subcutaneous TID WC  . insulin aspart  0-5 Units Subcutaneous QHS  . insulin glargine  30 Units Subcutaneous BID  . zinc sulfate  220 mg Oral Daily   Continuous: . sodium chloride 125 mL/hr at 10/14/19 1217  . remdesivir 100 mg in NS 100 mL 100 mg (10/14/19 0947)   [id number], guaiFENesin-dextromethorphan   Objective:  Vital Signs  Vitals:   10/13/19 2355 10/14/19 0133 10/14/19 0146 10/14/19 0714  BP: 108/79 (!) 113/95  119/87  Pulse: 92 90  80  Resp: 16 19  20   Temp:   99.1 F (37.3 C) 98.2 F (36.8 C)  TempSrc:   Oral Oral  SpO2: 93% 93%  92%  Weight:  111.6 kg    Height:  6\' 4"  (1.93 m)      Intake/Output Summary (Last 24 hours) at 10/14/2019 1309 Last data filed at 10/14/2019 0600 Gross per 24 hour  Intake 1555 ml  Output 800 ml  Net 755 ml   Filed Weights   10/13/19 1440 10/14/19 0133  Weight: 117.9 kg 111.6 kg    General appearance: Awake alert.  In no distress Resp: Normal effort at rest.  Few crackles at the right base.  No wheezing or rhonchi. Cardio: S1-S2 is normal regular.  No S3-S4.  No rubs murmurs or bruit GI: Abdomen is soft.  Nontender nondistended.  Bowel sounds are present normal.  No masses organomegaly Extremities: No edema.  Full range of motion of lower extremities. Neurologic: Alert and oriented x3.  No focal neurological deficits.    Lab Results:  Data Reviewed: I have personally reviewed following labs and imaging  studies  CBC: Recent Labs  Lab 10/09/19 2242 10/13/19 1525 10/14/19 0743  WBC 8.1 4.7 2.7*  NEUTROABS 6.3 3.1 1.9  HGB 11.9* 12.8* 11.8*  HCT 35.7* 38.6* 35.2*  MCV 94.2 94.6 93.6  PLT 187 133* 136*    Basic Metabolic Panel: Recent Labs  Lab 10/09/19 2242 10/13/19 1525 10/14/19 0743  NA 138 131* 132*  K 3.9 3.7 4.3  CL 106 98 103  CO2 22 20* 17*  GLUCOSE 446* 459* 434*  BUN 15 40* 40*  CREATININE 1.59* 2.56* 2.26*  CALCIUM 8.8* 8.1* 7.7*    GFR: Estimated Creatinine Clearance: 55.3 mL/min (A) (by C-G formula based on SCr of 2.26 mg/dL (H)).  Liver Function Tests: Recent Labs  Lab 10/09/19 2242 10/13/19 1525 10/14/19 0743  AST 24  45* 38  ALT 22 32 29  ALKPHOS 101 80 78  BILITOT 0.5 0.9 0.2*  PROT 6.8 7.9 7.1  ALBUMIN 2.7* 3.0* 2.5*    HbA1C: Recent Labs    10/13/19 1525  HGBA1C 12.6*    CBG: Recent Labs  Lab 10/10/19 0254 10/13/19 1436 10/13/19 2354 10/14/19 0716 10/14/19 1144  GLUCAP 230* 442* 430* 380* 347*     Anemia Panel: Recent Labs    10/14/19 0743  FERRITIN 4,067*    Recent Results (from the past 240 hour(s))  Culture, blood (routine x 2)     Status: None (Preliminary result)   Collection Time: 10/09/19 11:02 PM   Specimen: BLOOD RIGHT FOREARM  Result Value Ref Range Status   Specimen Description BLOOD RIGHT FOREARM  Final   Special Requests   Final    BOTTLES DRAWN AEROBIC AND ANAEROBIC Blood Culture adequate volume   Culture   Final    NO GROWTH 4 DAYS Performed at [facility], [place] [address], [place], [place] [address]    Report Status PENDING  Incomplete  Culture, blood (routine x 2)     Status: Abnormal (Preliminary result)   Collection Time: 10/09/19 11:25 PM   Specimen: BLOOD LEFT FOREARM  Result Value Ref Range Status   Specimen Description BLOOD LEFT FOREARM  Final   Special Requests   Final    BOTTLES DRAWN AEROBIC AND ANAEROBIC Blood Culture adequate volume   Culture  Setup Time   Final    GRAM  POSITIVE RODS IN BOTH AEROBIC AND ANAEROBIC BOTTLES CRITICAL RESULT CALLED TO, READ BACK BY AND VERIFIED WITH: Reynolds Bowl RN, AT [phone] 10/11/19 BY D. VANHOOK    Culture (A)  Final    DIPHTHEROIDS(CORYNEBACTERIUM SPECIES) Standardized susceptibility testing for this organism is not available. Performed at [facility], [place] [address] [place], [place] [address]    Report Status PENDING  Incomplete  Urine culture     Status: Abnormal   Collection Time: 10/10/19 12:56 AM   Specimen: Urine, Clean Catch  Result Value Ref Range Status   Specimen Description URINE, CLEAN CATCH  Final   Special Requests   Final    NONE Performed at [facility], [place] [address], [place], [place] [address]    Culture MULTIPLE SPECIES PRESENT, SUGGEST RECOLLECTION (A)  Final   Report Status 10/11/2019 FINAL  Final      Radiology Studies: DG Chest Portable 1 View  Result Date: 10/13/2019 CLINICAL DATA:  Fever and weakness. EXAM: PORTABLE CHEST 1 VIEW COMPARISON:  October 09, 2019 FINDINGS: Multiple sternal wires are seen. Mild patchy infiltrate is seen within the right lung base. This represents a new finding when compared to the prior study. There is no evidence of a pleural effusion or pneumothorax. The heart size and mediastinal contours are within normal limits. Multilevel degenerative changes seen throughout the thoracic spine. IMPRESSION: 1. Interval development of mild patchy right basilar infiltrate since the prior study dated October 09, 2019. Electronically Signed   By: Virgina Norfolk M.D.   On: 10/13/2019 15:48       LOS: 1 day   [facility] Pager on [email or web address]  10/14/2019, 1:09 PM

## 2019-10-14 NOTE — Progress Notes (Signed)
Updated Howard Mcmahon on patient's condition.  Answered all questions at this time.

## 2019-10-14 NOTE — Progress Notes (Addendum)
Inpatient Diabetes Program Recommendations  AACE/ADA: New Consensus Statement on Inpatient Glycemic Control  Target Ranges:  Prepandial:   less than 140 mg/dL      Peak postprandial:   less than 180 mg/dL (1-2 hours)      Critically ill patients:  140 - 180 mg/dL   Results for GIANNI, HOUT (MRN [id number]) as of 10/14/2019 08:01  Ref. Range 10/13/2019 14:36 10/13/2019 23:54  Glucose-Capillary Latest Ref Range: 70 - 99 mg/dL 442 (H) 430 (H)  Results for TREVEL, NICCOLI (MRN [id number]) as of 10/14/2019 08:01  Ref. Range 10/13/2019 15:25  Hemoglobin A1C Latest Ref Range: 4.8 - 5.6 % 12.6 (H)   Review of Glycemic Control  Diabetes history: DM2 Outpatient Diabetes medications: 70/30 40 units BID, Metformin XR 500 mg QAM Current orders for Inpatient glycemic control: Lantus 35 units QHS, Novolog 0-15 units TID with meals, Novolog 0-5 units QHS; Decadron 6 mg daily  Inpatient Diabetes Program Recommendations:   Insulin-Basal: Please consider ordering Lantus 20 units QAM (in addition to Lantus 35 units QHS).  A1C:  A1C 12.6% on 10/13/19 indicating an average glucose of 315 mg/dl. Noted prior A1C in Care Everywhere was 13.4% on 04/12/19.  NOTE: In reviewing chart, noted patient seen Dr. Gretta Cool on 09/29/19 and per office visit note, patient was prescribed 70/30 40 units BID and Glipizide 10 mg daily; also noted that patient had been out of all medications for several days.  Attempted to call patient but no answer. Will try again later today.  Addendum 10/14/19@14 :45-Spoke with patient over the phone about diabetes and home regimen for diabetes control. Patient reports going to a provider at [facility] and last went on 09/29/19. Patient reports that he is suppose to take 70/30 40 units BID and Metformin 500 mg BID for DM control. However, he notes that he is completely out of all outpatient medications and he told Dr. Gretta Cool that he was out of medications when he was seen 09/29/19 and that he could not afford to  get medications refilled. Patient states that he was getting medications from the [facility] but he has not been able to get medications from them for free any longer and he is not sure why. Patient states that he has testing supplies at home. Patient is interested in establishing care with a clinic in [place]. Informed patient that TOC is consulted and it will be noted that he needs medication assistance and would like to get established with a local clinic.   Discussed A1C results (12.6% on 10/13/19 ) and explained that current A1C indicates an average glucose of 315 mg/dl over the past 2-3 months. Discussed glucose and A1C goals. Discussed importance of checking CBGs and maintaining good CBG control to prevent long-term and short-term complications. Explained how hyperglycemia leads to damage within blood vessels which lead to the common complications seen with uncontrolled diabetes. Stressed to the patient the importance of improving glycemic control to prevent further complications from uncontrolled diabetes. Discussed impact of nutrition, exercise, stress, sickness, and medications on diabetes control.  Informed patient about Novolin 70/30 at [facility] for $25 per vial or $43 per box of 5 insulin pens. Patient states that he was not aware he could purchase Novolin 70/30 at [facility] over the counter. Patient notes that he is currently using vials of insulin and he is fine with continuing to use vial/syringes. Encouraged patient to be sure to get established with a clinic that he can go to consistently for follow up and take  DM medications as prescribed.   Patient verbalized understanding of information discussed and reports no further questions at this time related to diabetes. At time of discharge, please provide Rx for: Novolin 70/30 vials and Metformin as patient does not have any medications at home.  Thanks, Barnie Alderman, RN, MSN, CDE Diabetes Coordinator Inpatient Diabetes Program [phone] (Team  Pager from 8am to 5pm)

## 2019-10-14 NOTE — ED Notes (Signed)
CBG > 430, hospitalist paged

## 2019-10-14 NOTE — Progress Notes (Signed)
Initial Nutrition Assessment  DOCUMENTATION CODES:   Not applicable  INTERVENTION:   Ensure Max po BID, each supplement provides 150 kcal and 30 grams of protein.   NUTRITION DIAGNOSIS:   Increased nutrient needs related to acute illness, catabolic illness(COVID) as evidenced by estimated needs.  GOAL:   Patient will meet greater than or equal to 90% of their needs  MONITOR:   PO intake, Supplement acceptance  REASON FOR ASSESSMENT:   Malnutrition Screening Tool    ASSESSMENT:   49 yo male admitted with weakness, vomiting, poor oral intake. Tested positive for COVID on 1/30. PMH includes DM, HTN, CAD.   Patient is on a CHO modified heart healthy diet, no documentation of PO intake available. Currently on room air.   Labs reviewed. Na 132 (L), Glucose 434 (H) CBG's: 380-347  Medications reviewed and include vitamin C, decadron, novolog, lantus, zinc sulfate, remdesivir.  Usual weights reviewed. Patient has lost 5% of usual weight within the past week which is significant for the time frame. Suspect weight loss is related to dehydration and recent poor intake due to acute illness with vomiting.   NUTRITION - FOCUSED PHYSICAL EXAM:  unable to complete  Diet Order:   Diet Order            Diet heart healthy/carb modified Room service appropriate? Yes; Fluid consistency: Thin  Diet effective now              EDUCATION NEEDS:   Not appropriate for education at this time  Skin:  Skin Assessment: Reviewed RN Assessment(wound to penis)  Last BM:  2/2  Height:   Ht Readings from Last 1 Encounters:  10/14/19 6\' 4"  (1.93 m)    Weight:   Wt Readings from Last 1 Encounters:  10/14/19 111.6 kg    Ideal Body Weight:  91.8 kg  BMI:  Body mass index is 29.94 kg/m.  Estimated Nutritional Needs:   Kcal:  2800-3000  Protein:  155-190 gm  Fluid:  >/= 2.5 L    Molli Barrows, RD, LDN, CNSC Pager [phone] After Hours Pager [phone]

## 2019-10-15 LAB — COMPREHENSIVE METABOLIC PANEL
ALT: 27 U/L (ref 0–44)
AST: 35 U/L (ref 15–41)
Albumin: 2.4 g/dL — ABNORMAL LOW (ref 3.5–5.0)
Alkaline Phosphatase: 70 U/L (ref 38–126)
Anion gap: 13 (ref 5–15)
BUN: 46 mg/dL — ABNORMAL HIGH (ref 6–20)
CO2: 17 mmol/L — ABNORMAL LOW (ref 22–32)
Calcium: 7.9 mg/dL — ABNORMAL LOW (ref 8.9–10.3)
Chloride: 106 mmol/L (ref 98–111)
Creatinine, Ser: 2.09 mg/dL — ABNORMAL HIGH (ref 0.61–1.24)
GFR calc Af Amer: 42 mL/min — ABNORMAL LOW (ref >60)
GFR calc non Af Amer: 37 mL/min — ABNORMAL LOW (ref >60)
Glucose, Bld: 289 mg/dL — ABNORMAL HIGH (ref 70–99)
Potassium: 3.9 mmol/L (ref 3.5–5.1)
Sodium: 136 mmol/L (ref 135–145)
Total Bilirubin: 0.6 mg/dL (ref 0.3–1.2)
Total Protein: 6.7 g/dL (ref 6.5–8.1)

## 2019-10-15 LAB — CBC WITH DIFFERENTIAL/PLATELET
Abs Immature Granulocytes: 0.03 10*3/uL (ref 0.00–0.07)
Basophils Absolute: 0 10*3/uL (ref 0.0–0.1)
Basophils Relative: 0 %
Eosinophils Absolute: 0 10*3/uL (ref 0.0–0.5)
Eosinophils Relative: 0 %
HCT: 33.2 % — ABNORMAL LOW (ref 39.0–52.0)
Hemoglobin: 11.3 g/dL — ABNORMAL LOW (ref 13.0–17.0)
Immature Granulocytes: 1 %
Lymphocytes Relative: 26 %
Lymphs Abs: 0.9 10*3/uL (ref 0.7–4.0)
MCH: 32 pg (ref 26.0–34.0)
MCHC: 34 g/dL (ref 30.0–36.0)
MCV: 94.1 fL (ref 80.0–100.0)
Monocytes Absolute: 0.4 10*3/uL (ref 0.1–1.0)
Monocytes Relative: 11 %
Neutro Abs: 2 10*3/uL (ref 1.7–7.7)
Neutrophils Relative %: 62 %
Platelets: 142 10*3/uL — ABNORMAL LOW (ref 150–400)
RBC: 3.53 MIL/uL — ABNORMAL LOW (ref 4.22–5.81)
RDW: 12.6 % (ref 11.5–15.5)
WBC: 3.3 10*3/uL — ABNORMAL LOW (ref 4.0–10.5)
nRBC: 0 % (ref 0.0–0.2)

## 2019-10-15 LAB — CULTURE, BLOOD (ROUTINE X 2)
Culture: NO GROWTH
Special Requests: ADEQUATE
Special Requests: ADEQUATE

## 2019-10-15 LAB — D-DIMER, QUANTITATIVE: D-Dimer, Quant: 0.87 ug/mL-FEU — ABNORMAL HIGH (ref 0.00–0.50)

## 2019-10-15 LAB — GLUCOSE, CAPILLARY
Glucose-Capillary: 272 mg/dL — ABNORMAL HIGH (ref 70–99)
Glucose-Capillary: 249 mg/dL — ABNORMAL HIGH (ref 70–99)
Glucose-Capillary: 331 mg/dL — ABNORMAL HIGH (ref 70–99)
Glucose-Capillary: 339 mg/dL — ABNORMAL HIGH (ref 70–99)

## 2019-10-15 LAB — FERRITIN: Ferritin: 2288 ng/mL — ABNORMAL HIGH (ref 24–336)

## 2019-10-15 LAB — C-REACTIVE PROTEIN: CRP: 5.3 mg/dL — ABNORMAL HIGH (ref <1.0)

## 2019-10-15 NOTE — Evaluation (Signed)
Physical Therapy Evaluation Patient Details Name: Howard Mcmahon MRN: [id number] DOB: 11/17/1970 Today's Date: 10/15/2019   History of Present Illness  49 y.o. male with medical history significant of diabetes, essential hypertension, CAD  that is post CABG in 2019, presented to the emergency department with complaints of generalized weakness, vomiting, poor oral intake.  He was diagnosed with Covid test 19 infection on 10/10/2019.  He also noted that his blood sugars were running high.  He was having several episodes of vomiting, severe generalized weakness and diarrhoea.  Clinical Impression   Pt admitted with above diagnosis. PTA states was living home alone and independent, had to all ADLs and IADLs on his own, he was also driving. Denies use of or owning any DME/AD. Pt currently with functional limitations due to the deficits listed below (see PT Problem List). This pm pt did much better with mobility than previous, he was able to complete bed mob and transfers with sba-mod I, ambulated in room 1 x 40 with min guard assist, 1x80 with min guard assist. Gait is unsteady and very proprioceptive and pt has frank increased work of breathing, on initial ambulation pt desat to mid 80s but with sitting down and cues for pursed lip breathing was able to recover quickly, on second attempt at ambulation pt maintained sats in low 90s, pt was on room ir throughout session. Pt has been educate don use of and importance of incentive spirometer and also flutter valve. Pt will benefit from skilled PT to increase their independence and safety with mobility to allow discharge to the venue listed below.       Follow Up Recommendations Home health PT    Equipment Recommendations  Other (comment)(TBD possible RW or 3-1)    Recommendations for Other Services OT consult     Precautions / Restrictions Precautions Precautions: Fall Precaution Comments: monitor sats  Restrictions Weight Bearing Restrictions: No       Mobility  Bed Mobility Overal bed mobility: Modified Independent                Transfers Overall transfer level: Needs assistance   Transfers: Stand Pivot Transfers;Sit to/from Stand Sit to Stand: Supervision Stand pivot transfers: Supervision          Ambulation/Gait Ambulation/Gait assistance: Min guard Gait Distance (Feet): 80 Feet Assistive device: None Gait Pattern/deviations: Step-through pattern;Wide base of support;Staggering left;Staggering right(proprioceptive gait) Gait velocity: slow   General Gait Details: ambulated on room air max 75ft and sats in low 90s throughout, ambulated earlier approx 8ft and desat to mid 80s.  Stairs            Wheelchair Mobility    Modified Rankin (Stroke Patients Only)       Balance Overall balance assessment: Needs assistance Sitting-balance support: Feet supported Sitting balance-Leahy Scale: Good     Standing balance support: During functional activity Standing balance-Leahy Scale: Fair                               Pertinent Vitals/Pain Pain Assessment: No/denies pain    Home Living Family/patient expects to be discharged to:: Private residence Living Arrangements: Alone Available Help at Discharge: Family;Friend(s) Type of Home: Apartment Home Access: Level entry     Home Layout: One level Home Equipment: None Additional Comments: seems to live a pretty sedentary life    Prior Function Level of Independence: Independent         Comments: lives  alone does everything for himself inclusing driving     Hand Dominance   Dominant Hand: Right    Extremity/Trunk Assessment   Upper Extremity Assessment Upper Extremity Assessment: Generalized weakness    Lower Extremity Assessment Lower Extremity Assessment: Generalized weakness    Cervical / Trunk Assessment Cervical / Trunk Assessment: Kyphotic  Communication   Communication: No difficulties  Cognition  Arousal/Alertness: Lethargic Behavior During Therapy: WFL for tasks assessed/performed Overall Cognitive Status: No family/caregiver present to determine baseline cognitive functioning                                 General Comments: seems to be at or close to baseline      General Comments      Exercises Other Exercises Other Exercises: incentive spirometer pulled max 1237ml Other Exercises: flutter valve    Assessment/Plan    PT Assessment Patient needs continued PT services  PT Problem List Decreased strength;Decreased activity tolerance;Decreased balance;Decreased mobility;Decreased coordination;Decreased cognition;Decreased safety awareness       PT Treatment Interventions Gait training;DME instruction;Functional mobility training;Therapeutic activities;Therapeutic exercise;Balance training;Neuromuscular re-education;Patient/family education    PT Goals (Current goals can be found in the Care Plan section)  Acute Rehab PT Goals Patient Stated Goal: to go home but states will need some assist PT Goal Formulation: With patient Time For Goal Achievement: 10/29/19 Potential to Achieve Goals: Good    Frequency Min 3X/week   Barriers to discharge Other (comment) lives home alone    Co-evaluation               AM-PAC PT "6 Clicks" Mobility  Outcome Measure Help needed turning from your back to your side while in a flat bed without using bedrails?: None Help needed moving from lying on your back to sitting on the side of a flat bed without using bedrails?: None Help needed moving to and from a bed to a chair (including a wheelchair)?: A Little Help needed standing up from a chair using your arms (e.g., wheelchair or bedside chair)?: A Little Help needed to walk in hospital room?: A Little Help needed climbing 3-5 steps with a railing? : A Lot 6 Click Score: 19    End of Session Equipment Utilized During Treatment: Gait belt Activity Tolerance:  Patient limited by fatigue;Patient limited by lethargy Patient left: in bed;with call bell/phone within reach Nurse Communication: Mobility status PT Visit Diagnosis: Unsteadiness on feet (R26.81);Other abnormalities of gait and mobility (R26.89);Muscle weakness (generalized) (M62.81)    Time: [id number] PT Time Calculation (min) (ACUTE ONLY): 27 min   Charges:   PT Evaluation $PT Eval Moderate Complexity: 1 Mod PT Treatments $Gait Training: 8-22 mins        Horald Chestnut, PT   Delford Field 10/15/2019, 4:34 PM

## 2019-10-15 NOTE — Progress Notes (Addendum)
PROGRESS NOTE  Howard Mcmahon [id number] DOB: 01/16/1971 DOA: 10/13/2019  PCP: Patient, No Pcp Per  Brief History/Interval Summary: 49 y.o. male with medical history significant of diabetes, essential hypertension, CAD  that is post CABG in 2019, presented to the emergency department with complaints of generalized weakness, vomiting, poor oral intake.  He was diagnosed with Covid test 19 infection on 10/10/2019.  He also noted that his blood sugars were running high.  He was having several episodes of vomiting, severe generalized weakness and diarrhoea.  He was not able to tolerate anything by mouth.    He was found to be dehydrated.  Was hospitalized for further management.  Chest x-ray showed right lower lobe infiltrate.  Reason for Visit: Acute renal failure.  Consultants: None  Procedures: None  Antibiotics: Anti-infectives (From admission, onward)   Start     Dose/Rate Route Frequency Ordered Stop   10/14/19 1000  remdesivir 100 mg in sodium chloride 0.9 % 100 mL IVPB  Status:  Discontinued     100 mg 200 mL/hr over 30 Minutes Intravenous Daily 10/13/19 1832 10/13/19 1845   10/14/19 1000  remdesivir 100 mg in sodium chloride 0.9 % 100 mL IVPB     100 mg 200 mL/hr over 30 Minutes Intravenous Daily 10/13/19 1901 10/18/19 0959   10/13/19 2000  remdesivir 200 mg in sodium chloride 0.9% 250 mL IVPB  Status:  Discontinued     200 mg 580 mL/hr over 30 Minutes Intravenous Once 10/13/19 1832 10/13/19 1845   10/13/19 1915  remdesivir 200 mg in sodium chloride 0.9% 250 mL IVPB     200 mg 580 mL/hr over 30 Minutes Intravenous Once 10/13/19 1901 10/13/19 2355      Subjective/Interval History: Patient states that he is feeling better.  Denies any new complaints.  No chest pain.  Shortness of breath is improving.  Per nursing staff he has not really mobilized any since hospitalization.  Patient was encouraged to sit up in the chair and ambulate in the room and possibly even in the hallway  later today.    Assessment/Plan:  Pneumonia due to COVID-19   Recent Labs  Lab 10/09/19 2242 10/13/19 1525 10/14/19 0743 10/15/19 0250  DDIMER  --   --  1.23* 0.87*  FERRITIN  --   --  4,067* 2,288*  CRP  --   --  7.7* 5.3*  ALT 22 32 29 27    Objective findings: Fever: Afebrile Oxygen requirements: Saturating normal on room air.    COVID 19 Therapeutics: Antibacterials: None Remdesivir: Day 3 Steroids: Dexamethasone 6 mg daily Diuretics: Holding diuretics Actemra: Not given Convalescent Plasma: Not given yet Vitamin C and Zinc: Continue PUD Prophylaxis: Pepcid DVT Prophylaxis: Subcutaneous heparin  From a respiratory standpoint patient remained stable.  He saturating in the early 90s on room air.  Patient was started on remdesivir and steroids due to presence of infiltrate in the chest x-ray.  CRP was noted to be 7.7 yesterday.  Improved to 5.3 today.  Ferritin also improved.  D-dimer is also better.  Continue incentive spirometry.  Patient encouraged to mobilize and sit out of bed to chair.  Acute kidney injury Patient presented with a creatinine of 2.56.  Baseline creatinine appears to be around 1.4-1.5.  Patient being gently hydrated.  Renal function is slowly improving.  Continue to monitor.  Monitor urine output.  Avoid nephrotoxic agents.    Hyponatremia Likely due to dehydration.  Improved with hydration.  Diabetes mellitus type 2,  uncontrolled with hyperglycemia HbA1c 12.6 implying very poor control of diabetes.  Patient takes Lantus twice a day.  Compliance is questionable.  Apparently ran out of his medications recently.  Lantus dose was increased yesterday.  May need further titration.  Continue to monitor.    Hyperlipidemia On statin.  History of coronary artery disease status post CABG Stable.  Continue with antiplatelet agents.  On aspirin and Plavix.  Home medication list did show metoprolol.  However it looks like this was discontinued by an  outpatient provider.  We will continue to hold and let this be addressed by outpatient practitioners.  Continue to hold lisinopril due to renal failure.  Continue statin.    Essential hypertension Continue to monitor blood pressures.  Leukopenia Due to COVID-19.  Improved today.  Normocytic anemia Likely anemia of chronic disease.  No evidence of overt bleeding.  Continue to monitor.  Hemoglobin remains stable.   DVT Prophylaxis: Subcutaneous heparin Code Status: Full code Family Communication: Discussed with patient. Disposition Plan: Management as outlined above.  Await improvement in renal function.  Mobilize.   Medications:  Scheduled: . vitamin C  500 mg Oral Daily  . aspirin EC  81 mg Oral Daily  . atorvastatin  80 mg Oral q1800  . clopidogrel  75 mg Oral Q breakfast  . dexamethasone  6 mg Oral Daily  . famotidine  20 mg Oral Daily  . heparin  5,000 Units Subcutaneous Q8H  . insulin aspart  0-20 Units Subcutaneous TID WC  . insulin aspart  0-5 Units Subcutaneous QHS  . insulin aspart  6 Units Subcutaneous TID WC  . insulin glargine  30 Units Subcutaneous BID  . Ensure Max Protein  11 oz Oral BID  . zinc sulfate  220 mg Oral Daily   Continuous: . sodium chloride 75 mL/hr at 10/15/19 0525  . remdesivir 100 mg in NS 100 mL 100 mg (10/15/19 0920)   [id number], guaiFENesin-dextromethorphan   Objective:  Vital Signs  Vitals:   10/14/19 0714 10/14/19 2000 10/15/19 0400 10/15/19 0745  BP: 119/87 123/85 (!) 128/100   Pulse: 80     Resp: 20 20 18 20   Temp: 98.2 F (36.8 C) 99.1 F (37.3 C) 98.1 F (36.7 C) 98 F (36.7 C)  TempSrc: Oral Oral Oral Oral  SpO2: 92%     Weight:      Height:        Intake/Output Summary (Last 24 hours) at 10/15/2019 1033 Last data filed at 10/15/2019 0525 Gross per 24 hour  Intake 2754.07 ml  Output 1200 ml  Net 1554.07 ml   Filed Weights   10/13/19 1440 10/14/19 0133  Weight: 117.9 kg 111.6 kg    General appearance:  Awake alert.  In no distress Resp: Few crackles right base.  Normal effort.  No wheezing or rhonchi.   Cardio: S1-S2 is normal regular.  No S3-S4.  No rubs murmurs or bruit GI: Abdomen is soft.  Nontender nondistended.  Bowel sounds are present normal.  No masses organomegaly Extremities: No edema.  Full range of motion of lower extremities. Neurologic: Alert and oriented x3.  No focal neurological deficits.    Lab Results:  Data Reviewed: I have personally reviewed following labs and imaging studies  CBC: Recent Labs  Lab 10/09/19 2242 10/13/19 1525 10/14/19 0743 10/15/19 0250  WBC 8.1 4.7 2.7* 3.3*  NEUTROABS 6.3 3.1 1.9 2.0  HGB 11.9* 12.8* 11.8* 11.3*  HCT 35.7* 38.6* 35.2* 33.2*  MCV 94.2 94.6  93.6 94.1  PLT 187 133* 136* 142*    Basic Metabolic Panel: Recent Labs  Lab 10/09/19 2242 10/13/19 1525 10/14/19 0743 10/15/19 0250  NA 138 131* 132* 136  K 3.9 3.7 4.3 3.9  CL 106 98 103 106  CO2 22 20* 17* 17*  GLUCOSE 446* 459* 434* 289*  BUN 15 40* 40* 46*  CREATININE 1.59* 2.56* 2.26* 2.09*  CALCIUM 8.8* 8.1* 7.7* 7.9*    GFR: Estimated Creatinine Clearance: 59.8 mL/min (A) (by C-G formula based on SCr of 2.09 mg/dL (H)).  Liver Function Tests: Recent Labs  Lab 10/09/19 2242 10/13/19 1525 10/14/19 0743 10/15/19 0250  AST 24 45* 38 35  ALT 22 32 29 27  ALKPHOS 101 80 78 70  BILITOT 0.5 0.9 0.2* 0.6  PROT 6.8 7.9 7.1 6.7  ALBUMIN 2.7* 3.0* 2.5* 2.4*    HbA1C: Recent Labs    10/13/19 1525  HGBA1C 12.6*    CBG: Recent Labs  Lab 10/14/19 0716 10/14/19 1144 10/14/19 1649 10/14/19 2042 10/15/19 0744  GLUCAP 380* 347* 293* 321* 272*     Anemia Panel: Recent Labs    10/14/19 0743 10/15/19 0250  FERRITIN 4,067* 2,288*    Recent Results (from the past 240 hour(s))  Culture, blood (routine x 2)     Status: None (Preliminary result)   Collection Time: 10/09/19 11:02 PM   Specimen: BLOOD RIGHT FOREARM  Result Value Ref Range Status    Specimen Description BLOOD RIGHT FOREARM  Final   Special Requests   Final    BOTTLES DRAWN AEROBIC AND ANAEROBIC Blood Culture adequate volume   Culture   Final    NO GROWTH 4 DAYS Performed at [facility], [place] [address], [place], [place] [address]    Report Status PENDING  Incomplete  Culture, blood (routine x 2)     Status: Abnormal   Collection Time: 10/09/19 11:25 PM   Specimen: BLOOD LEFT FOREARM  Result Value Ref Range Status   Specimen Description BLOOD LEFT FOREARM  Final   Special Requests   Final    BOTTLES DRAWN AEROBIC AND ANAEROBIC Blood Culture adequate volume   Culture  Setup Time   Final    GRAM POSITIVE RODS IN BOTH AEROBIC AND ANAEROBIC BOTTLES CRITICAL RESULT CALLED TO, READ BACK BY AND VERIFIED WITH: Reynolds Bowl RN, AT [phone] 10/11/19 BY D. VANHOOK    Culture (A)  Final    DIPHTHEROIDS(CORYNEBACTERIUM SPECIES) Standardized susceptibility testing for this organism is not available. Performed at [facility], [place] [address] [place], [place] [address]    Report Status 10/15/2019 FINAL  Final  Urine culture     Status: Abnormal   Collection Time: 10/10/19 12:56 AM   Specimen: Urine, Clean Catch  Result Value Ref Range Status   Specimen Description URINE, CLEAN CATCH  Final   Special Requests   Final    NONE Performed at [facility], [place] [address] [place], [place] [address]    Culture MULTIPLE SPECIES PRESENT, SUGGEST RECOLLECTION (A)  Final   Report Status 10/11/2019 FINAL  Final      Radiology Studies: DG Chest Portable 1 View  Result Date: 10/13/2019 CLINICAL DATA:  Fever and weakness. EXAM: PORTABLE CHEST 1 VIEW COMPARISON:  October 09, 2019 FINDINGS: Multiple sternal wires are seen. Mild patchy infiltrate is seen within the right lung base. This represents a new finding when compared to the prior study. There is no evidence of a pleural effusion  or pneumothorax. The heart size and mediastinal contours are within normal limits.  Multilevel degenerative changes seen throughout the thoracic spine. IMPRESSION: 1. Interval development of mild patchy right basilar infiltrate since the prior study dated October 09, 2019. Electronically Signed   By: Virgina Norfolk M.D.   On: 10/13/2019 15:48       LOS: 2 days   [facility] Pager on [email or web address]  10/15/2019, 10:33 AM

## 2019-10-15 NOTE — Progress Notes (Signed)
Nutrition Follow-up  DOCUMENTATION CODES:   Not applicable  INTERVENTION:    Continue Ensure Max po BID, each supplement provides 150 kcal and 30 grams of protein.   Diabetes diet education handout attached to discharge instructions.  NUTRITION DIAGNOSIS:   Increased nutrient needs related to acute illness, catabolic illness(COVID) as evidenced by estimated needs.  GOAL:   Patient will meet greater than or equal to 90% of their needs  MONITOR:   PO intake, Supplement acceptance  REASON FOR ASSESSMENT:   Malnutrition Screening Tool    ASSESSMENT:   49 yo male admitted with weakness, vomiting, poor oral intake. Tested positive for COVID on 1/30. PMH includes DM, HTN, CAD.   Patient is on a CHO modified heart healthy diet, no documentation of PO intake available. He reports that the food is cold and rubbery. He does not tolerate lactose, so he hasn't been drinking the milk on his trays. He has been drinking the Ensure Max supplements. He hopes he will be discharged home tomorrow. He did not want to discuss DM diet at this time, he prefers to review information at home after discharge. Will attach diabetes diet education handout to discharge instructions. Patient has no questions about his diet at this time.  Currently on room air.   Labs reviewed. Na 136 WNL, Glucose 289 (H) CBG's: 293-321-272  Elevated glucoses related to steroids and acute illness.  Medications reviewed and include vitamin C, decadron, novolog, lantus, zinc sulfate, remdesivir.  NUTRITION - FOCUSED PHYSICAL EXAM:  unable to complete  Diet Order:   Diet Order            Diet heart healthy/carb modified Room service appropriate? Yes; Fluid consistency: Thin  Diet effective now              EDUCATION NEEDS:   Education needs have been addressed  Skin:  Skin Assessment: Reviewed RN Assessment(wound to penis)  Last BM:  2/2  Height:   Ht Readings from Last 1 Encounters:  10/14/19 6\' 4"   (1.93 m)    Weight:   Wt Readings from Last 1 Encounters:  10/14/19 111.6 kg    Ideal Body Weight:  91.8 kg  BMI:  Body mass index is 29.94 kg/m.  Estimated Nutritional Needs:   Kcal:  2800-3000  Protein:  155-190 gm  Fluid:  >/= 2.5 L   Molli Barrows, RD, LDN, CNSC Contact information can be found in Amion.

## 2019-10-15 NOTE — Progress Notes (Signed)
Results for LAVAUGHN, DUVERNAY (MRN [id number]) as of 10/15/2019 12:58  Ref. Range 10/14/2019 11:44 10/14/2019 16:49 10/14/2019 20:42 10/15/2019 07:44 10/15/2019 11:13  Glucose-Capillary Latest Ref Range: 70 - 99 mg/dL 347 (H) 293 (H) 321 (H) 272 (H) 249 (H)  Noted that CBGs have been greater than 200 mg/dl.  Recommend increasing Lantus to 35 units BID and increase Novolog meal coverage to 8 units TID if blood sugars continue to be elevated.  Titrate dosages as needed.   Harvel Ricks RN BSN CDE Diabetes Coordinator Pager: [phone]  8am-5pm

## 2019-10-15 NOTE — Progress Notes (Signed)
Ambulation Note  Saturation Pre: 94% on RA  Ambulation Distance: 25 ft  Saturation During Ambulation: 86-90% on 2 L  Notes: Pt's SpO2 dropped into the low 80s upon standing on RA. Placed on 2L. Pt used RW. He was extremely SOB. Pt returned to recliner with call bell within reach, SpO2 94% on RA.   Philis Kendall, MS, ACSM CEP 12:36 PM 10/15/2019

## 2019-10-15 NOTE — Plan of Care (Addendum)
Patient in bed. No s/s of pain or distress. All medication given well tolerated. Patient stated he updated his mother and girl friend. Will continue to monitor for remainder of shift.   Problem: Education: Goal: Knowledge of risk factors and measures for prevention of condition will improve 10/15/2019 1155 by Orvan Falconer, RN Outcome: Progressing 10/15/2019 1155 by Orvan Falconer, RN Outcome: Progressing   Problem: Coping: Goal: Psychosocial and spiritual needs will be supported 10/15/2019 1155 by Orvan Falconer, RN Outcome: Progressing 10/15/2019 1155 by Orvan Falconer, RN Outcome: Progressing   Problem: Respiratory: Goal: Will maintain a patent airway 10/15/2019 1155 by Orvan Falconer, RN Outcome: Progressing 10/15/2019 1155 by Orvan Falconer, RN Outcome: Progressing Goal: Complications related to the disease process, condition or treatment will be avoided or minimized 10/15/2019 1155 by Orvan Falconer, RN Outcome: Progressing 10/15/2019 1155 by Orvan Falconer, RN Outcome: Progressing

## 2019-10-16 ENCOUNTER — Telehealth: Payer: Self-pay

## 2019-10-16 LAB — CBC WITH DIFFERENTIAL/PLATELET
Abs Immature Granulocytes: 0.02 10*3/uL (ref 0.00–0.07)
Basophils Absolute: 0 10*3/uL (ref 0.0–0.1)
Basophils Relative: 0 %
Eosinophils Absolute: 0 10*3/uL (ref 0.0–0.5)
Eosinophils Relative: 0 %
HCT: 34.5 % — ABNORMAL LOW (ref 39.0–52.0)
Hemoglobin: 11.5 g/dL — ABNORMAL LOW (ref 13.0–17.0)
Immature Granulocytes: 1 %
Lymphocytes Relative: 24 %
Lymphs Abs: 0.9 10*3/uL (ref 0.7–4.0)
MCH: 31.3 pg (ref 26.0–34.0)
MCHC: 33.3 g/dL (ref 30.0–36.0)
MCV: 93.8 fL (ref 80.0–100.0)
Monocytes Absolute: 0.5 10*3/uL (ref 0.1–1.0)
Monocytes Relative: 12 %
Neutro Abs: 2.4 10*3/uL (ref 1.7–7.7)
Neutrophils Relative %: 63 %
Platelets: 176 10*3/uL (ref 150–400)
RBC: 3.68 MIL/uL — ABNORMAL LOW (ref 4.22–5.81)
RDW: 12.6 % (ref 11.5–15.5)
WBC: 3.8 10*3/uL — ABNORMAL LOW (ref 4.0–10.5)
nRBC: 0 % (ref 0.0–0.2)

## 2019-10-16 LAB — GLUCOSE, CAPILLARY
Glucose-Capillary: 194 mg/dL — ABNORMAL HIGH (ref 70–99)
Glucose-Capillary: 122 mg/dL — ABNORMAL HIGH (ref 70–99)
Glucose-Capillary: 223 mg/dL — ABNORMAL HIGH (ref 70–99)
Glucose-Capillary: 238 mg/dL — ABNORMAL HIGH (ref 70–99)

## 2019-10-16 LAB — COMPREHENSIVE METABOLIC PANEL
ALT: 27 U/L (ref 0–44)
AST: 31 U/L (ref 15–41)
Albumin: 2.3 g/dL — ABNORMAL LOW (ref 3.5–5.0)
Alkaline Phosphatase: 78 U/L (ref 38–126)
Anion gap: 10 (ref 5–15)
BUN: 41 mg/dL — ABNORMAL HIGH (ref 6–20)
CO2: 17 mmol/L — ABNORMAL LOW (ref 22–32)
Calcium: 8.3 mg/dL — ABNORMAL LOW (ref 8.9–10.3)
Chloride: 111 mmol/L (ref 98–111)
Creatinine, Ser: 1.77 mg/dL — ABNORMAL HIGH (ref 0.61–1.24)
GFR calc Af Amer: 52 mL/min — ABNORMAL LOW (ref >60)
GFR calc non Af Amer: 45 mL/min — ABNORMAL LOW (ref >60)
Glucose, Bld: 277 mg/dL — ABNORMAL HIGH (ref 70–99)
Potassium: 3.9 mmol/L (ref 3.5–5.1)
Sodium: 138 mmol/L (ref 135–145)
Total Bilirubin: 0.6 mg/dL (ref 0.3–1.2)
Total Protein: 6.8 g/dL (ref 6.5–8.1)

## 2019-10-16 LAB — FERRITIN: Ferritin: 1684 ng/mL — ABNORMAL HIGH (ref 24–336)

## 2019-10-16 LAB — D-DIMER, QUANTITATIVE: D-Dimer, Quant: 0.59 ug/mL-FEU — ABNORMAL HIGH (ref 0.00–0.50)

## 2019-10-16 LAB — C-REACTIVE PROTEIN: CRP: 3 mg/dL — ABNORMAL HIGH (ref <1.0)

## 2019-10-16 MED ORDER — POLYETHYLENE GLYCOL 3350 17 G PO PACK
17.0000 g | PACK | Freq: Every day | ORAL | Status: DC
  Administered 2019-10-16 – 2019-10-17 (×2): 17 g via ORAL
  Filled 2019-10-16 (×2): qty 1

## 2019-10-16 MED ORDER — SODIUM CHLORIDE 0.9 % IV SOLN: INTRAVENOUS | Status: DC

## 2019-10-16 NOTE — Plan of Care (Signed)
Plan of Care reviewed. 

## 2019-10-16 NOTE — Progress Notes (Signed)
Inpatient Diabetes Program Recommendations  AACE/ADA: New Consensus Statement on Inpatient Glycemic Control   Target Ranges:  Prepandial:   less than 140 mg/dL      Peak postprandial:   less than 180 mg/dL (1-2 hours)      Critically ill patients:  140 - 180 mg/dL   Results for Howard Mcmahon, Howard Mcmahon (MRN [id number]) as of 10/16/2019 08:57  Ref. Range 10/15/2019 07:44 10/15/2019 11:13 10/15/2019 17:50 10/15/2019 20:33 10/16/2019 07:14  Glucose-Capillary Latest Ref Range: 70 - 99 mg/dL 272 (H) 249 (H) 331 (H) 339 (H) 194 (H)   Review of Glycemic Control  Diabetes history: DM2 Outpatient Diabetes medications: 70/30 40 units BID, Metformin XR 500 mg QAM Current orders for Inpatient glycemic control: Lantus 30 units BID, Novolog 0-20 units TID with meals, Novolog 0-5 units QHS, Novolog 6 units TID with meals; Decadron 6 mg daily   Inpatient Diabetes Program Recommendations:    Insulin-Meal Coverage: If steroids are continued as ordered, please consider increasing meal coverage to Novolog 12 units TID with meals.  Insulin-Basal: If steroids are continued as ordered, please consider increasing Lantus to 32 units BID.  Thanks, Barnie Alderman, RN, MSN, CDE Diabetes Coordinator Inpatient Diabetes Program [phone] (Team Pager from 8am to 5pm)

## 2019-10-16 NOTE — Telephone Encounter (Signed)
+   BC ? Contaminant per [employer] PA.  Admitted to [facility] now

## 2019-10-16 NOTE — Progress Notes (Signed)
PROGRESS NOTE  Howard Mcmahon [id number] DOB: 11-18-1970 DOA: 10/13/2019  PCP: Patient, No Pcp Per  Brief History/Interval Summary: 49 y.o. male with medical history significant of diabetes, essential hypertension, CAD  that is post CABG in 2019, presented to the emergency department with complaints of generalized weakness, vomiting, poor oral intake.  He was diagnosed with Covid test 19 infection on 10/10/2019.  He also noted that his blood sugars were running high.  He was having several episodes of vomiting, severe generalized weakness and diarrhoea.  He was not able to tolerate anything by mouth.    He was found to be dehydrated.  Was hospitalized for further management.  Chest x-ray showed right lower lobe infiltrate.  Reason for Visit: Acute renal failure.  Consultants: None  Procedures: None  Antibiotics: Anti-infectives (From admission, onward)   Start     Dose/Rate Route Frequency Ordered Stop   10/14/19 1000  remdesivir 100 mg in sodium chloride 0.9 % 100 mL IVPB  Status:  Discontinued     100 mg 200 mL/hr over 30 Minutes Intravenous Daily 10/13/19 1832 10/13/19 1845   10/14/19 1000  remdesivir 100 mg in sodium chloride 0.9 % 100 mL IVPB     100 mg 200 mL/hr over 30 Minutes Intravenous Daily 10/13/19 1901 10/18/19 0959   10/13/19 2000  remdesivir 200 mg in sodium chloride 0.9% 250 mL IVPB  Status:  Discontinued     200 mg 580 mL/hr over 30 Minutes Intravenous Once 10/13/19 1832 10/13/19 1845   10/13/19 1915  remdesivir 200 mg in sodium chloride 0.9% 250 mL IVPB     200 mg 580 mL/hr over 30 Minutes Intravenous Once 10/13/19 1901 10/13/19 2355      Subjective/Interval History: Patient mention this morning that he was feeling better.  Shortness of breath improved.  No chest pain.  Later on informed by the nurse that he was experiencing some "tightness" in his lower abdomen.  He had not had a bowel movement in 2 days.  He was given laxatives.  Apparently had one episode of  emesis as well.      Assessment/Plan:  Pneumonia due to COVID-19   Recent Labs  Lab 10/09/19 2242 10/13/19 1525 10/14/19 0743 10/15/19 0250 10/16/19 0149  DDIMER  --   --  1.23* 0.87* 0.59*  FERRITIN  --   --  [id number]* 2,288* 1,684*  CRP  --   --  7.7* 5.3* 3.0*  ALT 22 32 29 27 27     Objective findings: Fever: Remains afebrile Oxygen requirements: On room air saturating in the 90s.    COVID 19 Therapeutics: Antibacterials: None Remdesivir: Day 4 Steroids: Dexamethasone 6 mg daily Diuretics: Holding diuretics Actemra: Not given Convalescent Plasma: Not given yet Vitamin C and Zinc: Continue PUD Prophylaxis: Pepcid DVT Prophylaxis: Subcutaneous heparin  From a respiratory standpoint patient is stable.  He saturating normal on room air.  Continue remdesivir and steroids.  His inflammatory markers have been improving.  Continue incentive spirometry and mobilization.  Acute kidney injury Baseline creatinine appears to be around 1.4-1.5.  Patient presented with a creatinine of 2.56.  Patient gently hydrated.  Renal function improving.  Down to 1.77 today.  Continue IV fluids for another 24 hours.  Avoid nephrotoxic agents.  Monitor urine output.   Lower abdominal discomfort with the vomiting Apparently has not had a BM in 2 days.  We will give him laxatives.  May need imaging studies if his symptoms persist.  Hyponatremia Likely due to  dehydration.  Improved with hydration.  Diabetes mellitus type 2, uncontrolled with hyperglycemia HbA1c 12.6 implying very poor control of diabetes.  Patient supposedly on Lantus at home however he has not been able to afford his medications in the last 2 months.  This is the likely reason for his elevated HbA1c.  He will need more affordable insulin at discharge.  He will be discharged on 70/30 insulin.  Continue to monitor.  Consider increasing dose of Lantus.  Hyperlipidemia On statin.  History of coronary artery disease status post  CABG Stable.  Continue with antiplatelet agents.  On aspirin and Plavix.  Home medication list did show metoprolol.  However it looks like this was discontinued by an outpatient provider.  We will continue to hold and let this be addressed by outpatient practitioners.  Continue to hold lisinopril due to renal failure.  Continue statin.    Essential hypertension Continue to monitor blood pressures.  Leukopenia Due to COVID-19.  Improving.  Normocytic anemia Likely anemia of chronic disease.  No evidence of overt bleeding.  Continue to monitor.  Hemoglobin remains stable  Positive blood cultures Diphtheroids noted in one set of blood culture.  Appears to be contaminant as patient remains afebrile and without leukocytosis.  DVT Prophylaxis: Subcutaneous heparin Code Status: Full code Family Communication: Discussed with patient. Disposition Plan: Management as outlined above.  Await improvement in renal function.  Mobilize.   Medications:  Scheduled: . vitamin C  500 mg Oral Daily  . aspirin EC  81 mg Oral Daily  . atorvastatin  80 mg Oral q1800  . clopidogrel  75 mg Oral Q breakfast  . dexamethasone  6 mg Oral Daily  . famotidine  20 mg Oral Daily  . heparin  5,000 Units Subcutaneous Q8H  . insulin aspart  0-20 Units Subcutaneous TID WC  . insulin aspart  0-5 Units Subcutaneous QHS  . insulin aspart  6 Units Subcutaneous TID WC  . insulin glargine  30 Units Subcutaneous BID  . polyethylene glycol  17 g Oral Daily  . Ensure Max Protein  11 oz Oral BID  . zinc sulfate  220 mg Oral Daily   Continuous: . sodium chloride    . remdesivir 100 mg in NS 100 mL 100 mg (10/16/19 0918)   [id number], guaiFENesin-dextromethorphan   Objective:  Vital Signs  Vitals:   10/15/19 1917 10/16/19 0325 10/16/19 0837 10/16/19 0910  BP: (!) 146/98 (!) 138/94 (!) 138/98   Pulse: 84 80 85   Resp: 16 18 20    Temp: 98.5 F (36.9 C) 98.5 F (36.9 C)  99 F (37.2 C)  TempSrc: Oral  Oral  Oral  SpO2: 96% 93% 95%   Weight:      Height:        Intake/Output Summary (Last 24 hours) at 10/16/2019 1130 Last data filed at 10/16/2019 0931 Gross per 24 hour  Intake -  Output 1752 ml  Net -1752 ml   Filed Weights   10/13/19 1440 10/14/19 0133  Weight: 117.9 kg 111.6 kg    General appearance: Awake alert.  In no distress Resp: Few crackles at the bases more so on the right.  Normal effort.  No wheezing or rhonchi. Cardio: S1-S2 is normal regular.  No S3-S4.  No rubs murmurs or bruit GI: Abdomen is soft.  Nontender nondistended.  Bowel sounds are present normal.  No masses organomegaly Extremities: No edema.  Full range of motion of lower extremities. Neurologic: Alert and oriented x3.  No focal neurological deficits.     Lab Results:  Data Reviewed: I have personally reviewed following labs and imaging studies  CBC: Recent Labs  Lab 10/09/19 2242 10/13/19 1525 10/14/19 0743 10/15/19 0250 10/16/19 0149  WBC 8.1 4.7 2.7* 3.3* 3.8*  NEUTROABS 6.3 3.1 1.9 2.0 2.4  HGB 11.9* 12.8* 11.8* 11.3* 11.5*  HCT 35.7* 38.6* 35.2* 33.2* 34.5*  MCV 94.2 94.6 93.6 94.1 93.8  PLT 187 133* 136* 142* 0000000    Basic Metabolic Panel: Recent Labs  Lab 10/09/19 2242 10/13/19 1525 10/14/19 0743 10/15/19 0250 10/16/19 0149  NA 138 131* 132* 136 138  K 3.9 3.7 4.3 3.9 3.9  CL 106 98 103 106 111  CO2 22 20* 17* 17* 17*  GLUCOSE 446* 459* 434* 289* 277*  BUN 15 40* 40* 46* 41*  CREATININE 1.59* 2.56* 2.26* 2.09* 1.77*  CALCIUM 8.8* 8.1* 7.7* 7.9* 8.3*    GFR: Estimated Creatinine Clearance: 70.6 mL/min (A) (by C-G formula based on SCr of 1.77 mg/dL (H)).  Liver Function Tests: Recent Labs  Lab 10/09/19 2242 10/13/19 1525 10/14/19 0743 10/15/19 0250 10/16/19 0149  AST 24 45* 38 35 31  ALT 22 32 29 27 27   ALKPHOS 101 80 78 70 78  BILITOT 0.5 0.9 0.2* 0.6 0.6  PROT 6.8 7.9 7.1 6.7 6.8  ALBUMIN 2.7* 3.0* 2.5* 2.4* 2.3*    HbA1C: Recent Labs    10/13/19 1525   HGBA1C 12.6*    CBG: Recent Labs  Lab 10/15/19 0744 10/15/19 1113 10/15/19 1750 10/15/19 2033 10/16/19 0714  GLUCAP 272* 249* 331* 339* 194*     Anemia Panel: Recent Labs    10/15/19 0250 10/16/19 0149  FERRITIN 2,288* 1,684*    Recent Results (from the past 240 hour(s))  Culture, blood (routine x 2)     Status: None   Collection Time: 10/09/19 11:02 PM   Specimen: BLOOD RIGHT FOREARM  Result Value Ref Range Status   Specimen Description BLOOD RIGHT FOREARM  Final   Special Requests   Final    BOTTLES DRAWN AEROBIC AND ANAEROBIC Blood Culture adequate volume   Culture   Final    NO GROWTH 5 DAYS Performed at [facility], [place] [address] [place], [place] [address]    Report Status 10/15/2019 FINAL  Final  Culture, blood (routine x 2)     Status: Abnormal   Collection Time: 10/09/19 11:25 PM   Specimen: BLOOD LEFT FOREARM  Result Value Ref Range Status   Specimen Description BLOOD LEFT FOREARM  Final   Special Requests   Final    BOTTLES DRAWN AEROBIC AND ANAEROBIC Blood Culture adequate volume   Culture  Setup Time   Final    GRAM POSITIVE RODS IN BOTH AEROBIC AND ANAEROBIC BOTTLES CRITICAL RESULT CALLED TO, READ BACK BY AND VERIFIED WITH: Reynolds Bowl RN, AT [phone] 10/11/19 BY D. VANHOOK    Culture (A)  Final    DIPHTHEROIDS(CORYNEBACTERIUM SPECIES) Standardized susceptibility testing for this organism is not available. Performed at [facility], [place] [address], [place], [place] [address]    Report Status 10/15/2019 FINAL  Final  Urine culture     Status: Abnormal   Collection Time: 10/10/19 12:56 AM   Specimen: Urine, Clean Catch  Result Value Ref Range Status   Specimen Description URINE, CLEAN CATCH  Final   Special Requests   Final    NONE Performed at [facility], [place],  [place] [address]    Culture MULTIPLE SPECIES PRESENT, SUGGEST RECOLLECTION (A)  Final   Report Status 10/11/2019 FINAL  Final      Radiology  Studies: No results found.     LOS: 3 days   Shenita Trego [employer] on [email or web address]  10/16/2019, 11:30 AM

## 2019-10-16 NOTE — Progress Notes (Signed)
Physical Therapy Treatment Patient Details Name: Howard Mcmahon MRN: [id number] DOB: 03-29-1971 Today's Date: 10/16/2019    History of Present Illness 49 y.o. male with medical history significant of diabetes, essential hypertension, CAD  that is post CABG in 2019, presented to the emergency department with complaints of generalized weakness, vomiting, poor oral intake.  He was diagnosed with Covid test 19 infection on 10/10/2019.  He also noted that his blood sugars were running high.  He was having several episodes of vomiting, severe generalized weakness and diarrhoea.    PT Comments    Pt is making progress with mobility, he was able to tolerate slightly increased ambulation distance this am. Pt still has difficulty with pursed lip breathing needing reinforcement for this. Pt was on room air and noted to desat to low 80s briefly while ambulating in hall. Once seated able to recover within <59min to 90s saturation still on room air.     Follow Up Recommendations  Home health PT     Equipment Recommendations  Other (comment)    Recommendations for Other Services       Precautions / Restrictions Precautions Precautions: Fall Precaution Comments: monitor sats  Restrictions Weight Bearing Restrictions: No    Mobility  Bed Mobility Overal bed mobility: Needs Assistance Bed Mobility: Supine to Sit     Supine to sit: Min assist        Transfers Overall transfer level: Needs assistance Equipment used: Rolling walker (2 wheeled) Transfers: Sit to/from Stand Sit to Stand: Supervision            Ambulation/Gait Ambulation/Gait assistance: Min guard Gait Distance (Feet): 100 Feet Assistive device: Rolling walker (2 wheeled) Gait Pattern/deviations: Step-through pattern;Wide base of support;Staggering left;Staggering right Gait velocity: slow   General Gait Details: (on room air and again needing cues for pursed lip breathing )   Stairs             Wheelchair  Mobility    Modified Rankin (Stroke Patients Only)       Balance Overall balance assessment: Needs assistance Sitting-balance support: Feet supported Sitting balance-Leahy Scale: Good     Standing balance support: During functional activity;Bilateral upper extremity supported Standing balance-Leahy Scale: Fair                              Cognition Arousal/Alertness: Awake/alert Behavior During Therapy: WFL for tasks assessed/performed Overall Cognitive Status: Within Functional Limits for tasks assessed                                 General Comments: seems to be his baseline      Exercises      General Comments        Pertinent Vitals/Pain Pain Assessment: Faces Faces Pain Scale: Hurts even more Pain Location: states soreness with mobility this am Pain Intervention(s): Limited activity within patient's tolerance;Monitored during session    Home Living                      Prior Function            PT Goals (current goals can now be found in the care plan section) Acute Rehab PT Goals PT Goal Formulation: With patient Time For Goal Achievement: 10/29/19 Potential to Achieve Goals: Good Progress towards PT goals: Progressing toward goals    Frequency    Min  3X/week      PT Plan Current plan remains appropriate    Co-evaluation              AM-PAC PT "6 Clicks" Mobility   Outcome Measure  Help needed turning from your back to your side while in a flat bed without using bedrails?: None Help needed moving from lying on your back to sitting on the side of a flat bed without using bedrails?: None Help needed moving to and from a bed to a chair (including a wheelchair)?: A Little Help needed standing up from a chair using your arms (e.g., wheelchair or bedside chair)?: A Little Help needed to walk in hospital room?: A Little Help needed climbing 3-5 steps with a railing? : A Lot 6 Click Score: 19    End of  Session Equipment Utilized During Treatment: Gait belt Activity Tolerance: Patient limited by fatigue;Patient limited by lethargy;Treatment limited secondary to medical complications (Comment) Patient left: in chair;with call bell/phone within reach Nurse Communication: Mobility status;Other (comment)(pt disposition at end) PT Visit Diagnosis: Unsteadiness on feet (R26.81);Other abnormalities of gait and mobility (R26.89);Muscle weakness (generalized) (M62.81)     Time: [id number] PT Time Calculation (min) (ACUTE ONLY): 24 min  Charges:  $Gait Training: 8-22 mins $Therapeutic Activity: 8-22 mins                     Horald Chestnut, PT    Delford Field 10/16/2019, 1:52 PM

## 2019-10-17 LAB — COMPREHENSIVE METABOLIC PANEL
ALT: 24 U/L (ref 0–44)
AST: 30 U/L (ref 15–41)
Albumin: 2.5 g/dL — ABNORMAL LOW (ref 3.5–5.0)
Alkaline Phosphatase: 79 U/L (ref 38–126)
Anion gap: 17 — ABNORMAL HIGH (ref 5–15)
BUN: 35 mg/dL — ABNORMAL HIGH (ref 6–20)
CO2: 15 mmol/L — ABNORMAL LOW (ref 22–32)
Calcium: 8.1 mg/dL — ABNORMAL LOW (ref 8.9–10.3)
Chloride: 106 mmol/L (ref 98–111)
Creatinine, Ser: 1.42 mg/dL — ABNORMAL HIGH (ref 0.61–1.24)
GFR calc Af Amer: >60 mL/min (ref >60)
GFR calc non Af Amer: 58 mL/min — ABNORMAL LOW (ref >60)
Glucose, Bld: 150 mg/dL — ABNORMAL HIGH (ref 70–99)
Potassium: 3.7 mmol/L (ref 3.5–5.1)
Sodium: 138 mmol/L (ref 135–145)
Total Bilirubin: 0.7 mg/dL (ref 0.3–1.2)
Total Protein: 7.1 g/dL (ref 6.5–8.1)

## 2019-10-17 LAB — CBC WITH DIFFERENTIAL/PLATELET
Abs Immature Granulocytes: 0.08 10*3/uL — ABNORMAL HIGH (ref 0.00–0.07)
Basophils Absolute: 0 10*3/uL (ref 0.0–0.1)
Basophils Relative: 0 %
Eosinophils Absolute: 0 10*3/uL (ref 0.0–0.5)
Eosinophils Relative: 0 %
HCT: 36.6 % — ABNORMAL LOW (ref 39.0–52.0)
Hemoglobin: 12.1 g/dL — ABNORMAL LOW (ref 13.0–17.0)
Immature Granulocytes: 2 %
Lymphocytes Relative: 30 %
Lymphs Abs: 1.5 10*3/uL (ref 0.7–4.0)
MCH: 30.9 pg (ref 26.0–34.0)
MCHC: 33.1 g/dL (ref 30.0–36.0)
MCV: 93.6 fL (ref 80.0–100.0)
Monocytes Absolute: 0.7 10*3/uL (ref 0.1–1.0)
Monocytes Relative: 13 %
Neutro Abs: 2.7 10*3/uL (ref 1.7–7.7)
Neutrophils Relative %: 55 %
Platelets: 226 10*3/uL (ref 150–400)
RBC: 3.91 MIL/uL — ABNORMAL LOW (ref 4.22–5.81)
RDW: 12.8 % (ref 11.5–15.5)
WBC: 5 10*3/uL (ref 4.0–10.5)
nRBC: 0 % (ref 0.0–0.2)

## 2019-10-17 LAB — FERRITIN: Ferritin: 1416 ng/mL — ABNORMAL HIGH (ref 24–336)

## 2019-10-17 LAB — GLUCOSE, CAPILLARY
Glucose-Capillary: 110 mg/dL — ABNORMAL HIGH (ref 70–99)
Glucose-Capillary: 167 mg/dL — ABNORMAL HIGH (ref 70–99)
Glucose-Capillary: 299 mg/dL — ABNORMAL HIGH (ref 70–99)
Glucose-Capillary: 230 mg/dL — ABNORMAL HIGH (ref 70–99)

## 2019-10-17 LAB — C-REACTIVE PROTEIN: CRP: 3.1 mg/dL — ABNORMAL HIGH (ref <1.0)

## 2019-10-17 LAB — D-DIMER, QUANTITATIVE: D-Dimer, Quant: 0.61 ug/mL-FEU — ABNORMAL HIGH (ref 0.00–0.50)

## 2019-10-17 MED ORDER — SODIUM BICARBONATE 650 MG PO TABS
1300.0000 mg | ORAL_TABLET | Freq: Three times a day (TID) | ORAL | Status: DC
  Administered 2019-10-17 – 2019-10-20 (×10): 1300 mg via ORAL
  Filled 2019-10-17 (×14): qty 2

## 2019-10-17 MED ORDER — POLYETHYLENE GLYCOL 3350 17 G PO PACK
17.0000 g | PACK | Freq: Two times a day (BID) | ORAL | Status: DC
  Administered 2019-10-17: 17 g via ORAL
  Filled 2019-10-17 (×2): qty 1

## 2019-10-17 MED ORDER — DOCUSATE SODIUM 100 MG PO CAPS
100.0000 mg | ORAL_CAPSULE | Freq: Two times a day (BID) | ORAL | Status: DC
  Administered 2019-10-17 – 2019-10-18 (×3): 100 mg via ORAL
  Filled 2019-10-17 (×3): qty 1

## 2019-10-17 MED ORDER — INSULIN GLARGINE 100 UNIT/ML ~~LOC~~ SOLN
25.0000 [IU] | Freq: Two times a day (BID) | SUBCUTANEOUS | Status: DC
  Administered 2019-10-17 – 2019-10-18 (×2): 25 [IU] via SUBCUTANEOUS
  Filled 2019-10-17 (×3): qty 0.25

## 2019-10-17 NOTE — Progress Notes (Signed)
PROGRESS NOTE  Tari Jointer [id number] DOB: 1971-03-23 DOA: 10/13/2019  PCP: Patient, No Pcp Per  Brief History/Interval Summary: 49 y.o. male with medical history significant of diabetes, essential hypertension, CAD  that is post CABG in 2019, presented to the emergency department with complaints of generalized weakness, vomiting, poor oral intake.  He was diagnosed with Covid test 19 infection on 10/10/2019.  He also noted that his blood sugars were running high.  He was having several episodes of vomiting, severe generalized weakness and diarrhoea.  He was not able to tolerate anything by mouth.    He was found to be dehydrated.  Was hospitalized for further management.  Chest x-ray showed right lower lobe infiltrate.  Reason for Visit: Acute renal failure.  Consultants: None  Procedures: None  Antibiotics: Anti-infectives (From admission, onward)   Start     Dose/Rate Route Frequency Ordered Stop   10/14/19 1000  remdesivir 100 mg in sodium chloride 0.9 % 100 mL IVPB  Status:  Discontinued     100 mg 200 mL/hr over 30 Minutes Intravenous Daily 10/13/19 1832 10/13/19 1845   10/14/19 1000  remdesivir 100 mg in sodium chloride 0.9 % 100 mL IVPB     100 mg 200 mL/hr over 30 Minutes Intravenous Daily 10/13/19 1901 10/17/19 1017   10/13/19 2000  remdesivir 200 mg in sodium chloride 0.9% 250 mL IVPB  Status:  Discontinued     200 mg 580 mL/hr over 30 Minutes Intravenous Once 10/13/19 1832 10/13/19 1845   10/13/19 1915  remdesivir 200 mg in sodium chloride 0.9% 250 mL IVPB     200 mg 580 mL/hr over 30 Minutes Intravenous Once 10/13/19 1901 10/13/19 2355      Subjective/Interval History: Patient mentions that he does not have any abdominal pain at this time.  He did not have any further episodes of vomiting after the one episode yesterday morning.  Still has not had any bowel movement.     Assessment/Plan:  Pneumonia due to COVID-19   Recent Labs  Lab 10/13/19 1525  10/14/19 0743 10/15/19 0250 10/16/19 0149 10/17/19 0119  DDIMER  --  1.23* 0.87* 0.59* 0.61*  FERRITIN  --  4,067* 2,288* 1,684* 1,416*  CRP  --  7.7* 5.3* 3.0* 3.1*  ALT 32 29 27 27 24     Objective findings: Fever: Remains afebrile with Oxygen requirements: Room air.  Saturating in the 90s.  COVID 19 Therapeutics: Antibacterials: None Remdesivir: Day 5 Steroids: Dexamethasone 6 mg daily Diuretics: Holding diuretics Actemra: Not given Convalescent Plasma: Not given yet Vitamin C and Zinc: Continue PUD Prophylaxis: Pepcid DVT Prophylaxis: Subcutaneous heparin  Patient is stable from a respiratory standpoint.  He is saturating normal on room air.  Complete course of remdesivir today.  Continue steroids.  Inflammatory markers have improved.  Continue incentive spirometry.  Mobilize.  Does tend to desaturate with exertion.  May need home oxygen.  Acute kidney injury Baseline creatinine appears to be around 1.4-1.5.  Patient presented with a creatinine of 2.56.  Patient was gently hydrated.  Renal function is back to his baseline now.  Discontinue IV fluids.  Monitor urine output.  [place] acidosis most likely due to renal dysfunction.  Will initiate oral sodium bicarbonate.   Lower abdominal discomfort with the vomiting Abdomen is completely benign today.  Has not had a BM but patient not bothered by it.  Will continue to give him laxatives and stool softeners.    Hyponatremia Likely due to dehydration.  Improved  with hydration.  Diabetes mellitus type 2, uncontrolled with hyperglycemia HbA1c 12.6 implying very poor control of diabetes.  Patient supposedly on Lantus at home however he has not been able to afford his medications in the last 2 months.  This is the likely reason for his elevated HbA1c.  He will need more affordable insulin at discharge.  He will need to be discharged on 70/30 insulin.  Continue current dose of Lantus.  Monitor closely.  Hyperlipidemia On  statin.  History of coronary artery disease status post CABG Stable.  Continue with antiplatelet agents.  On aspirin and Plavix.  Home medication list did show metoprolol.  However it looks like this was discontinued by an outpatient provider.  We will continue to hold and let this be addressed by outpatient practitioners.  Continue to hold lisinopril due to renal failure.  Continue statin.    Essential hypertension Continue to monitor blood pressures.  Leukopenia Due to COVID-19.  Improving.  Normocytic anemia Likely anemia of chronic disease.  No evidence of overt bleeding.  Continue to monitor.  Hemoglobin remains stable  Positive blood cultures Diphtheroids noted in one set of blood culture.  Appears to be contaminant as patient remains afebrile and without leukocytosis.  DVT Prophylaxis: Subcutaneous heparin Code Status: Full code Family Communication: Discussed with patient. Disposition Plan: Management as outlined above.  Mobilize.  Hopefully discharge in 24 to 48 hours.   Medications:  Scheduled: . vitamin C  500 mg Oral Daily  . aspirin EC  81 mg Oral Daily  . atorvastatin  80 mg Oral q1800  . clopidogrel  75 mg Oral Q breakfast  . dexamethasone  6 mg Oral Daily  . famotidine  20 mg Oral Daily  . heparin  5,000 Units Subcutaneous Q8H  . insulin aspart  0-20 Units Subcutaneous TID WC  . insulin aspart  0-5 Units Subcutaneous QHS  . insulin aspart  6 Units Subcutaneous TID WC  . insulin glargine  30 Units Subcutaneous BID  . polyethylene glycol  17 g Oral Daily  . Ensure Max Protein  11 oz Oral BID  . sodium bicarbonate  1,300 mg Oral TID  . zinc sulfate  220 mg Oral Daily   Continuous: . sodium chloride 50 mL/hr at 10/16/19 1608   [id number], guaiFENesin-dextromethorphan   Objective:  Vital Signs  Vitals:   10/16/19 2042 10/16/19 2043 10/17/19 0412 10/17/19 0834  BP: (!) 157/90  (!) 150/97 (!) 135/93  Pulse:   77 91  Resp:  18 20 18   Temp:  98.6  F (37 C) 98.7 F (37.1 C) 98.1 F (36.7 C)  TempSrc:  Oral Axillary Oral  SpO2:   93% 91%  Weight:      Height:        Intake/Output Summary (Last 24 hours) at 10/17/2019 1216 Last data filed at 10/17/2019 0617 Gross per 24 hour  Intake --  Output 1025 ml  Net -1025 ml   Filed Weights   10/13/19 1440 10/14/19 0133  Weight: 117.9 kg 111.6 kg    General appearance: Awake alert.  In no distress Resp: Improved air entry.  Few crackles at the bases.  No wheezing or rhonchi. Cardio: S1-S2 is normal regular.  No S3-S4.  No rubs murmurs or bruit GI: Abdomen is soft.  Nontender nondistended.  Bowel sounds are present normal.  No masses organomegaly Extremities: No edema.  Full range of motion of lower extremities. Neurologic: Alert and oriented x3.  No focal neurological deficits.  Lab Results:  Data Reviewed: I have personally reviewed following labs and imaging studies  CBC: Recent Labs  Lab 10/13/19 1525 10/14/19 0743 10/15/19 0250 10/16/19 0149 10/17/19 0119  WBC 4.7 2.7* 3.3* 3.8* 5.0  NEUTROABS 3.1 1.9 2.0 2.4 2.7  HGB 12.8* 11.8* 11.3* 11.5* 12.1*  HCT 38.6* 35.2* 33.2* 34.5* 36.6*  MCV 94.6 93.6 94.1 93.8 93.6  PLT 133* 136* 142* 176 [id number]    Basic Metabolic Panel: Recent Labs  Lab 10/13/19 1525 10/14/19 0743 10/15/19 0250 10/16/19 0149 10/17/19 0119  NA 131* 132* 136 138 138  K 3.7 4.3 3.9 3.9 3.7  CL 98 103 106 111 106  CO2 20* 17* 17* 17* 15*  GLUCOSE 459* 434* 289* 277* 150*  BUN 40* 40* 46* 41* 35*  CREATININE 2.56* 2.26* 2.09* 1.77* 1.42*  CALCIUM 8.1* 7.7* 7.9* 8.3* 8.1*    GFR: Estimated Creatinine Clearance: 88 mL/min (A) (by C-G formula based on SCr of 1.42 mg/dL (H)).  Liver Function Tests: Recent Labs  Lab 10/13/19 1525 10/14/19 0743 10/15/19 0250 10/16/19 0149 10/17/19 0119  AST 45* 38 35 31 30  ALT 32 29 27 27 24   ALKPHOS 80 78 70 78 79  BILITOT 0.9 0.2* 0.6 0.6 0.7  PROT 7.9 7.1 6.7 6.8 7.1  ALBUMIN 3.0* 2.5* 2.4* 2.3*  2.5*    HbA1C: No results for input(s): HGBA1C in the last 72 hours.  CBG: Recent Labs  Lab 10/16/19 0714 10/16/19 1128 10/16/19 1717 10/16/19 2046 10/17/19 0758  GLUCAP 194* 122* 223* 238* 110*     Anemia Panel: Recent Labs    10/16/19 0149 10/17/19 0119  FERRITIN 1,684* 1,416*    Recent Results (from the past 240 hour(s))  Culture, blood (routine x 2)     Status: None   Collection Time: 10/09/19 11:02 PM   Specimen: BLOOD RIGHT FOREARM  Result Value Ref Range Status   Specimen Description BLOOD RIGHT FOREARM  Final   Special Requests   Final    BOTTLES DRAWN AEROBIC AND ANAEROBIC Blood Culture adequate volume   Culture   Final    NO GROWTH 5 DAYS Performed at [facility], [place] [address], [place], [place] [address]    Report Status 10/15/2019 FINAL  Final  Culture, blood (routine x 2)     Status: Abnormal   Collection Time: 10/09/19 11:25 PM   Specimen: BLOOD LEFT FOREARM  Result Value Ref Range Status   Specimen Description BLOOD LEFT FOREARM  Final   Special Requests   Final    BOTTLES DRAWN AEROBIC AND ANAEROBIC Blood Culture adequate volume   Culture  Setup Time   Final    GRAM POSITIVE RODS IN BOTH AEROBIC AND ANAEROBIC BOTTLES CRITICAL RESULT CALLED TO, READ BACK BY AND VERIFIED WITH: Reynolds Bowl RN, AT [phone] 10/11/19 BY D. VANHOOK    Culture (A)  Final    DIPHTHEROIDS(CORYNEBACTERIUM SPECIES) Standardized susceptibility testing for this organism is not available. Performed at [facility], [place] [address] [place], [place] [address]    Report Status 10/15/2019 FINAL  Final  Urine culture     Status: Abnormal   Collection Time: 10/10/19 12:56 AM   Specimen: Urine, Clean Catch  Result Value Ref Range Status   Specimen Description URINE, CLEAN CATCH  Final   Special Requests   Final    NONE Performed at [facility], [place] [address] [place], [place] [address]    Culture MULTIPLE SPECIES PRESENT, SUGGEST  RECOLLECTION (A)  Final    Report Status 10/11/2019 FINAL  Final      Radiology Studies: No results found.     LOS: 4 days   Karsyn Rochin [employer] on [email or web address]  10/17/2019, 12:16 PM

## 2019-10-17 NOTE — Progress Notes (Signed)
Occupational Therapy Evaluation Patient Details Name: Howard Mcmahon MRN: [id number] DOB: [birth year] Today's Date: 10/17/2019    History of Present Illness 49 y.o. male with medical history significant of diabetes, essential hypertension, CAD  that is post CABG in 2019, presented to the emergency department with complaints of generalized weakness, vomiting, poor oral intake.  He was diagnosed with Covid test 19 infection on 10/10/2019.  He also noted that his blood sugars were running high.  He was having several episodes of vomiting, severe generalized weakness and diarrhoea.   Clinical Impression   Patient lives alone in an apartment and is independent at prior level.  He was on room air throughout session and at rest SpO2 was 95.  Patient was supervision for sit to stand.  On standing patient desat to 19 and felt dizzy and was short of breath.  Returned to chair, BP in normal range.  Patient was very fatigued after stand.  Practiced pursed lip breathing extensively and educated on flutter valve and incentive spirometer (pulled 1250).  Completed seated ADLs with set up.  Will continue to follow with OT acutely to address the deficits listed below.      Follow Up Recommendations  Home health OT    Equipment Recommendations  3 in 1 bedside commode;Other (comment)    Recommendations for Other Services       Precautions / Restrictions Precautions Precautions: Fall Precaution Comments: monitor sats  Restrictions Weight Bearing Restrictions: No      Mobility Bed Mobility Overal bed mobility: (up in chair)                Transfers Overall transfer level: Needs assistance Equipment used: Rolling walker (2 wheeled) Transfers: Sit to/from Stand Sit to Stand: Supervision              Balance Overall balance assessment: Needs assistance Sitting-balance support: Feet supported Sitting balance-Leahy Scale: Good     Standing balance support: During functional  activity;Bilateral upper extremity supported Standing balance-Leahy Scale: Fair                             ADL either performed or assessed with clinical judgement   ADL Overall ADL's : Needs assistance/impaired Eating/Feeding: Sitting;Set up   Grooming: Set up;Sitting   Upper Body Bathing: Set up;Sitting   Lower Body Bathing: Minimal assistance;Sit to/from stand   Upper Body Dressing : Set up;Sitting   Lower Body Dressing: Minimal assistance;Sit to/from stand   Toilet Transfer: Min guard;Ambulation           Functional mobility during ADLs: Min guard;Rolling walker General ADL Comments: desat with standing, took only 2 steps before sitting again due to dizziness     Vision         Perception     Praxis      Pertinent Vitals/Pain Pain Assessment: No/denies pain     Hand Dominance Right   Extremity/Trunk Assessment Upper Extremity Assessment Upper Extremity Assessment: Generalized weakness           Communication Communication Communication: No difficulties   Cognition Arousal/Alertness: Lethargic Behavior During Therapy: WFL for tasks assessed/performed Overall Cognitive Status: Within Functional Limits for tasks assessed                                 General Comments: seems to be his baseline   General Comments  Exercises Exercises: Other exercises Other Exercises Other Exercises: incentive spirometer pulled max 1270ml Other Exercises: flutter valve    Shoulder Instructions      Home Living Family/patient expects to be discharged to:: Private residence Living Arrangements: Alone Available Help at Discharge: Family;Friend(s) Type of Home: Apartment Home Access: Level entry     Home Layout: One level     Bathroom Shower/Tub: Teacher, early years/pre: Standard     Home Equipment: None          Prior Functioning/Environment Level of Independence: Independent        Comments: lives  alone does everything for himself inclusing driving        OT Problem List: Decreased strength;Decreased activity tolerance;Impaired balance (sitting and/or standing);Cardiopulmonary status limiting activity      OT Treatment/Interventions: Self-care/ADL training;Therapeutic exercise;Energy conservation;Therapeutic activities    OT Goals(Current goals can be found in the care plan section) Acute Rehab OT Goals Patient Stated Goal: to go home but states will need some assist OT Goal Formulation: With patient Time For Goal Achievement: 10/31/19 Potential to Achieve Goals: Good  OT Frequency: Min 3X/week   Barriers to D/C:            Co-evaluation              AM-PAC OT "6 Clicks" Daily Activity     Outcome Measure Help from another person eating meals?: None Help from another person taking care of personal grooming?: A Little Help from another person toileting, which includes using toliet, bedpan, or urinal?: A Little Help from another person bathing (including washing, rinsing, drying)?: A Little Help from another person to put on and taking off regular upper body clothing?: A Little Help from another person to put on and taking off regular lower body clothing?: A Little 6 Click Score: 19   End of Session Equipment Utilized During Treatment: Gait belt;Rolling walker Nurse Communication: Mobility status  Activity Tolerance: Patient limited by fatigue;Other (comment)(Dizziness) Patient left: in chair;with call bell/phone within reach;with chair alarm set  OT Visit Diagnosis: Unsteadiness on feet (R26.81);Muscle weakness (generalized) (M62.81);Other (comment)(Cadiopulminary limitations)                Time: [id number] OT Time Calculation (min): 38 min Charges:  OT General Charges $OT Visit: 1 Visit OT Evaluation $OT Eval Moderate Complexity: 1 Mod OT Treatments $Self Care/Home Management : 8-22 mins $Therapeutic Activity: 8-22 mins  [name],  OTR/L   [name] 10/17/2019, 3:37 PM

## 2019-10-17 NOTE — Plan of Care (Signed)
Plan of Care reviewed. 

## 2019-10-18 ENCOUNTER — Inpatient Hospital Stay (HOSPITAL_COMMUNITY): Payer: HRSA Program

## 2019-10-18 LAB — CBC WITH DIFFERENTIAL/PLATELET
Abs Immature Granulocytes: 0.2 10*3/uL — ABNORMAL HIGH (ref 0.00–0.07)
Basophils Absolute: 0 10*3/uL (ref 0.0–0.1)
Basophils Relative: 0 %
Eosinophils Absolute: 0 10*3/uL (ref 0.0–0.5)
Eosinophils Relative: 0 %
HCT: 36.5 % — ABNORMAL LOW (ref 39.0–52.0)
Hemoglobin: 12.2 g/dL — ABNORMAL LOW (ref 13.0–17.0)
Immature Granulocytes: 2 %
Lymphocytes Relative: 25 %
Lymphs Abs: 2.2 10*3/uL (ref 0.7–4.0)
MCH: 31.5 pg (ref 26.0–34.0)
MCHC: 33.4 g/dL (ref 30.0–36.0)
MCV: 94.3 fL (ref 80.0–100.0)
Monocytes Absolute: 0.9 10*3/uL (ref 0.1–1.0)
Monocytes Relative: 10 %
Neutro Abs: 5.7 10*3/uL (ref 1.7–7.7)
Neutrophils Relative %: 63 %
Platelets: 266 10*3/uL (ref 150–400)
RBC: 3.87 MIL/uL — ABNORMAL LOW (ref 4.22–5.81)
RDW: 13 % (ref 11.5–15.5)
WBC: 9.1 10*3/uL (ref 4.0–10.5)
nRBC: 0 % (ref 0.0–0.2)

## 2019-10-18 LAB — COMPREHENSIVE METABOLIC PANEL
ALT: 24 U/L (ref 0–44)
AST: 27 U/L (ref 15–41)
Albumin: 2.5 g/dL — ABNORMAL LOW (ref 3.5–5.0)
Alkaline Phosphatase: 87 U/L (ref 38–126)
Anion gap: 13 (ref 5–15)
BUN: 33 mg/dL — ABNORMAL HIGH (ref 6–20)
CO2: 17 mmol/L — ABNORMAL LOW (ref 22–32)
Calcium: 8.4 mg/dL — ABNORMAL LOW (ref 8.9–10.3)
Chloride: 110 mmol/L (ref 98–111)
Creatinine, Ser: 1.43 mg/dL — ABNORMAL HIGH (ref 0.61–1.24)
GFR calc Af Amer: >60 mL/min (ref >60)
GFR calc non Af Amer: 58 mL/min — ABNORMAL LOW (ref >60)
Glucose, Bld: 67 mg/dL — ABNORMAL LOW (ref 70–99)
Potassium: 3.6 mmol/L (ref 3.5–5.1)
Sodium: 140 mmol/L (ref 135–145)
Total Bilirubin: 1 mg/dL (ref 0.3–1.2)
Total Protein: 7.3 g/dL (ref 6.5–8.1)

## 2019-10-18 LAB — GLUCOSE, CAPILLARY
Glucose-Capillary: 90 mg/dL (ref 70–99)
Glucose-Capillary: 126 mg/dL — ABNORMAL HIGH (ref 70–99)
Glucose-Capillary: 302 mg/dL — ABNORMAL HIGH (ref 70–99)
Glucose-Capillary: 273 mg/dL — ABNORMAL HIGH (ref 70–99)

## 2019-10-18 LAB — C-REACTIVE PROTEIN: CRP: 4.4 mg/dL — ABNORMAL HIGH (ref <1.0)

## 2019-10-18 LAB — D-DIMER, QUANTITATIVE: D-Dimer, Quant: 0.73 ug/mL-FEU — ABNORMAL HIGH (ref 0.00–0.50)

## 2019-10-18 LAB — FERRITIN: Ferritin: 1164 ng/mL — ABNORMAL HIGH (ref 24–336)

## 2019-10-18 MED ORDER — POLYETHYLENE GLYCOL 3350 17 G PO PACK: 17.0000 g | PACK | Freq: Every day | ORAL | Status: DC | PRN

## 2019-10-18 MED ORDER — INSULIN GLARGINE 100 UNIT/ML ~~LOC~~ SOLN
25.0000 [IU] | Freq: Every day | SUBCUTANEOUS | Status: DC
  Administered 2019-10-19: 25 [IU] via SUBCUTANEOUS
  Filled 2019-10-18 (×2): qty 0.25

## 2019-10-18 NOTE — Plan of Care (Signed)
Plan of Care reviewed. 

## 2019-10-18 NOTE — Progress Notes (Signed)
PROGRESS NOTE  Howard Mcmahon [id number] DOB: Nov 24, 1970 DOA: 10/13/2019  PCP: Patient, No Pcp Per  Brief History/Interval Summary: 49 y.o. male with medical history significant of diabetes, essential hypertension, CAD  that is post CABG in 2019, presented to the emergency department with complaints of generalized weakness, vomiting, poor oral intake.  He was diagnosed with Covid test 19 infection on 10/10/2019.  He also noted that his blood sugars were running high.  He was having several episodes of vomiting, severe generalized weakness and diarrhoea.  He was not able to tolerate anything by mouth.    He was found to be dehydrated.  Was hospitalized for further management.  Chest x-ray showed right lower lobe infiltrate.  Reason for Visit: Acute renal failure.  Consultants: None  Procedures: None  Antibiotics: Anti-infectives (From admission, onward)   Start     Dose/Rate Route Frequency Ordered Stop   10/14/19 1000  remdesivir 100 mg in sodium chloride 0.9 % 100 mL IVPB  Status:  Discontinued     100 mg 200 mL/hr over 30 Minutes Intravenous Daily 10/13/19 1832 10/13/19 1845   10/14/19 1000  remdesivir 100 mg in sodium chloride 0.9 % 100 mL IVPB     100 mg 200 mL/hr over 30 Minutes Intravenous Daily 10/13/19 1901 10/17/19 1030   10/13/19 2000  remdesivir 200 mg in sodium chloride 0.9% 250 mL IVPB  Status:  Discontinued     200 mg 580 mL/hr over 30 Minutes Intravenous Once 10/13/19 1832 10/13/19 1845   10/13/19 1915  remdesivir 200 mg in sodium chloride 0.9% 250 mL IVPB     200 mg 580 mL/hr over 30 Minutes Intravenous Once 10/13/19 1901 10/13/19 2355      Subjective/Interval History: Patient somewhat of a poor historian.  He tells me that he had loose stool this morning.  Feels a little nauseated.  Denies any shortness of breath.  Denies any abdominal pain.       Assessment/Plan:  Pneumonia due to COVID-19   Recent Labs  Lab 10/14/19 0743 10/15/19 0250 10/16/19 0149  10/17/19 0119 10/18/19 0313  DDIMER 1.23* 0.87* 0.59* 0.61* 0.73*  FERRITIN 4,067* 2,288* 1,684* 1,416* 1,164*  CRP 7.7* 5.3* 3.0* 3.1* 4.4*  ALT 29 27 27 24 24     Objective findings: Fever: Remains afebrile Oxygen requirements: He was placed on oxygen overnight.  Was noted to be on room air this morning saturating in the early 90s.    COVID 19 Therapeutics: Antibacterials: None Remdesivir: Completed course on 2/6 Steroids: Dexamethasone 6 mg daily Diuretics: Holding diuretics Actemra: Not given Convalescent Plasma: Not given yet Vitamin C and Zinc: Continue PUD Prophylaxis: Pepcid DVT Prophylaxis: Subcutaneous heparin  From a respiratory standpoint patient seems to be stable.  For the most part he saturating normal on room air.  With exertion he tends to desaturate.  He will likely need home oxygen.  Inflammatory markers had improved.  We will repeat chest x-ray just to make sure there is no worsening in his pneumonia.  Continue steroids.  He has completed course of remdesivir.  Continue to mobilize.  Acute kidney injury Baseline creatinine appears to be around 1.4-1.5.  Patient presented with a creatinine of 2.56.  Patient was gently hydrated.  Function back to baseline.  Monitor urine output.  Sodium bicarbonate to continue for now.  Metabolic acidosis is slightly better.   Lower abdominal discomfort  Symptoms most likely due to constipation.  Patient was given laxatives.  He had a large bowel  movement early this morning which could have caused him to have abdominal discomfort.  His abdomen was benign on examination.  X-ray is pending.  Continue to monitor for now.    Hyponatremia Likely due to dehydration.  Improved with hydration.  Diabetes mellitus type 2, uncontrolled with hyperglycemia HbA1c 12.6 implying very poor control of diabetes.  Patient supposedly on Lantus at home however he has not been able to afford his medications in the last 2 months.  This is the likely  reason for his elevated HbA1c.  He will need more affordable insulin at discharge.  He will need to be discharged on 70/30 insulin.  Encourage oral intake.  Low glucose level noted this morning.  Cut back on Lantus.  Hyperlipidemia On statin.  History of coronary artery disease status post CABG Stable.  Continue with antiplatelet agents.  On aspirin and Plavix.  Home medication list did show metoprolol.  However it looks like this was discontinued by an outpatient provider.  We will continue to hold and let this be addressed by outpatient practitioners.  Continue to hold lisinopril due to renal failure.  Continue statin.    Essential hypertension Continue to monitor blood pressures.  Leukopenia Due to COVID-19.  Resolved.  Normocytic anemia Likely anemia of chronic disease.  No evidence of overt bleeding.  Continue to monitor.  Hemoglobin remains stable  Positive blood cultures Diphtheroids noted in one set of blood culture.  Appears to be contaminant as patient remains afebrile and without leukocytosis.  DVT Prophylaxis: Subcutaneous heparin Code Status: Full code Family Communication: Discussed with patient. Disposition Plan: Home health recommended by PT and OT.  May need home oxygen.     Medications:  Scheduled: . vitamin C  500 mg Oral Daily  . aspirin EC  81 mg Oral Daily  . atorvastatin  80 mg Oral q1800  . clopidogrel  75 mg Oral Q breakfast  . dexamethasone  6 mg Oral Daily  . docusate sodium  100 mg Oral BID  . famotidine  20 mg Oral Daily  . heparin  5,000 Units Subcutaneous Q8H  . insulin aspart  0-20 Units Subcutaneous TID WC  . insulin aspart  0-5 Units Subcutaneous QHS  . insulin glargine  25 Units Subcutaneous BID  . polyethylene glycol  17 g Oral BID  . Ensure Max Protein  11 oz Oral BID  . sodium bicarbonate  1,300 mg Oral TID  . zinc sulfate  220 mg Oral Daily   Continuous:  [id number], guaiFENesin-dextromethorphan   Objective:  Vital  Signs  Vitals:   10/18/19 0446 10/18/19 0522 10/18/19 0550 10/18/19 0808  BP: 130/87   103/76  Pulse: (!) 108   91  Resp:    17  Temp: 98.4 F (36.9 C)   99.1 F (37.3 C)  TempSrc: Oral   Oral  SpO2: 90% 93% 97% 92%  Weight:      Height:        Intake/Output Summary (Last 24 hours) at 10/18/2019 1144 Last data filed at 10/18/2019 0134 Gross per 24 hour  Intake --  Output 1675 ml  Net -1675 ml   Filed Weights   10/13/19 1440 10/14/19 0133  Weight: 117.9 kg 111.6 kg    General appearance: Awake alert.  In no distress Resp: Few crackles at the bases.  No wheezing or rhonchi.   Cardio: S1-S2 is normal regular.  No S3-S4.  No rubs murmurs or bruit GI: Abdomen is soft.  Nontender nondistended.  Bowel  sounds are present normal.  No masses organomegaly Extremities: No edema.  Full range of motion of lower extremities. Neurologic: Alert and oriented x3.  No focal neurological deficits.      Lab Results:  Data Reviewed: I have personally reviewed following labs and imaging studies  CBC: Recent Labs  Lab 10/14/19 0743 10/15/19 0250 10/16/19 0149 10/17/19 0119 10/18/19 0313  WBC 2.7* 3.3* 3.8* 5.0 9.1  NEUTROABS 1.9 2.0 2.4 2.7 5.7  HGB 11.8* 11.3* 11.5* 12.1* 12.2*  HCT 35.2* 33.2* 34.5* 36.6* 36.5*  MCV 93.6 94.1 93.8 93.6 94.3  PLT 136* 142* 176 226 [id number]    Basic Metabolic Panel: Recent Labs  Lab 10/14/19 0743 10/15/19 0250 10/16/19 0149 10/17/19 0119 10/18/19 0313  NA 132* 136 138 138 140  K 4.3 3.9 3.9 3.7 3.6  CL 103 106 111 106 110  CO2 17* 17* 17* 15* 17*  GLUCOSE 434* 289* 277* 150* 67*  BUN 40* 46* 41* 35* 33*  CREATININE 2.26* 2.09* 1.77* 1.42* 1.43*  CALCIUM 7.7* 7.9* 8.3* 8.1* 8.4*    GFR: Estimated Creatinine Clearance: 87.3 mL/min (A) (by C-G formula based on SCr of 1.43 mg/dL (H)).  Liver Function Tests: Recent Labs  Lab 10/14/19 0743 10/15/19 0250 10/16/19 0149 10/17/19 0119 10/18/19 0313  AST 38 35 31 30 27   ALT 29 27 27 24 24    ALKPHOS 78 70 78 79 87  BILITOT 0.2* 0.6 0.6 0.7 1.0  PROT 7.1 6.7 6.8 7.1 7.3  ALBUMIN 2.5* 2.4* 2.3* 2.5* 2.5*    HbA1C: No results for input(s): HGBA1C in the last 72 hours.  CBG: Recent Labs  Lab 10/17/19 0758 10/17/19 1219 10/17/19 1705 10/17/19 2003 10/18/19 0810  GLUCAP 110* 167* 299* 230* 90     Anemia Panel: Recent Labs    10/17/19 0119 10/18/19 0313  FERRITIN 1,416* 1,164*    Recent Results (from the past 240 hour(s))  Culture, blood (routine x 2)     Status: None   Collection Time: 10/09/19 11:02 PM   Specimen: BLOOD RIGHT FOREARM  Result Value Ref Range Status   Specimen Description BLOOD RIGHT FOREARM  Final   Special Requests   Final    BOTTLES DRAWN AEROBIC AND ANAEROBIC Blood Culture adequate volume   Culture   Final    NO GROWTH 5 DAYS Performed at [facility], [place] [address], [place], [place] [address]    Report Status 10/15/2019 FINAL  Final  Culture, blood (routine x 2)     Status: Abnormal   Collection Time: 10/09/19 11:25 PM   Specimen: BLOOD LEFT FOREARM  Result Value Ref Range Status   Specimen Description BLOOD LEFT FOREARM  Final   Special Requests   Final    BOTTLES DRAWN AEROBIC AND ANAEROBIC Blood Culture adequate volume   Culture  Setup Time   Final    GRAM POSITIVE RODS IN BOTH AEROBIC AND ANAEROBIC BOTTLES CRITICAL RESULT CALLED TO, READ BACK BY AND VERIFIED WITH: Reynolds Bowl RN, AT [phone] 10/11/19 BY D. VANHOOK    Culture (A)  Final    DIPHTHEROIDS(CORYNEBACTERIUM SPECIES) Standardized susceptibility testing for this organism is not available. Performed at [facility], [place] [address] [place], [place] [address]    Report Status 10/15/2019 FINAL  Final  Urine culture     Status: Abnormal   Collection Time: 10/10/19 12:56 AM   Specimen: Urine, Clean Catch  Result Value Ref Range Status   Specimen Description URINE, CLEAN CATCH  Final   Special Requests   Final    NONE Performed at [facility],  [place] [address], [place], [place] [address]    Culture MULTIPLE SPECIES PRESENT, SUGGEST RECOLLECTION (A)  Final   Report Status 10/11/2019 FINAL  Final      Radiology Studies: No results found.     LOS: 5 days   Jewelene Mairena [employer] on [email or web address]  10/18/2019, 11:44 AM

## 2019-10-19 DIAGNOSIS — I2510 Atherosclerotic heart disease of native coronary artery without angina pectoris: Secondary | ICD-10-CM

## 2019-10-19 DIAGNOSIS — U071 COVID-19: Principal | ICD-10-CM

## 2019-10-19 DIAGNOSIS — E1165 Type 2 diabetes mellitus with hyperglycemia: Secondary | ICD-10-CM

## 2019-10-19 DIAGNOSIS — J1282 Pneumonia due to coronavirus disease 2019: Secondary | ICD-10-CM

## 2019-10-19 LAB — BASIC METABOLIC PANEL
Anion gap: 14 (ref 5–15)
BUN: 35 mg/dL — ABNORMAL HIGH (ref 6–20)
CO2: 17 mmol/L — ABNORMAL LOW (ref 22–32)
Calcium: 8.5 mg/dL — ABNORMAL LOW (ref 8.9–10.3)
Chloride: 111 mmol/L (ref 98–111)
Creatinine, Ser: 1.42 mg/dL — ABNORMAL HIGH (ref 0.61–1.24)
GFR calc Af Amer: >60 mL/min (ref >60)
GFR calc non Af Amer: 58 mL/min — ABNORMAL LOW (ref >60)
Glucose, Bld: 106 mg/dL — ABNORMAL HIGH (ref 70–99)
Potassium: 3.4 mmol/L — ABNORMAL LOW (ref 3.5–5.1)
Sodium: 142 mmol/L (ref 135–145)

## 2019-10-19 LAB — GLUCOSE, CAPILLARY
Glucose-Capillary: 130 mg/dL — ABNORMAL HIGH (ref 70–99)
Glucose-Capillary: 151 mg/dL — ABNORMAL HIGH (ref 70–99)
Glucose-Capillary: 267 mg/dL — ABNORMAL HIGH (ref 70–99)
Glucose-Capillary: 240 mg/dL — ABNORMAL HIGH (ref 70–99)

## 2019-10-19 MED ORDER — POTASSIUM CHLORIDE CRYS ER 20 MEQ PO TBCR
40.0000 meq | EXTENDED_RELEASE_TABLET | Freq: Once | ORAL | Status: AC
  Administered 2019-10-19: 40 meq via ORAL
  Filled 2019-10-19: qty 2

## 2019-10-19 NOTE — Plan of Care (Signed)
Plan of Care reviewed. 

## 2019-10-19 NOTE — Progress Notes (Signed)
Occupational Therapy Treatment Patient Details Name: Howard Mcmahon MRN: [id number] DOB: 19-Sep-1970 Today's Date: 10/19/2019    History of present illness 49 y.o. male with medical history significant of diabetes, essential hypertension, CAD  that is post CABG in 2019, presented to the emergency department with complaints of generalized weakness, vomiting, poor oral intake.  He was diagnosed with Covid test 19 infection on 10/10/2019.  He also noted that his blood sugars were running high.  He was having several episodes of vomiting, severe generalized weakness and diarrhoea.   OT comments  Patient has demonstrated progress with motivation to participate in self-care bathing task at sink level and dressing. Patient was educated on energy conservation techniques and able to recall and incorporate in sponge bath and mobility using RW. Patient required several rest breaks on room air during bathing task and use of shower seat for 50% of sponge bath. Overall ADL level at Modified (I) to Supv level with CGA for functional mobility using RW. Patient was able to mobilize about 150 feet on room air with 2 rest breaks O2 stats at rest 95% and 93% after ambulation. Patient will benefit from continued skilled OT services to improve ADL skill levels.    Follow Up Recommendations  Home health OT    Equipment Recommendations  3 in 1 bedside commode;Other (comment)(RW)    Recommendations for Other Services      Precautions / Restrictions Precautions Precautions: Fall Restrictions Weight Bearing Restrictions: No       Mobility Bed Mobility                  Transfers Overall transfer level: Modified independent Equipment used: Rolling walker (2 wheeled)     Stand pivot transfers: Modified independent (Device/Increase time)            Balance     Sitting balance-Leahy Scale: Normal       Standing balance-Leahy Scale: Fair                             ADL either performed  or assessed with clinical judgement   ADL   Eating/Feeding: Independent   Grooming: Set up   Upper Body Bathing: Set up   Lower Body Bathing: Supervison/ safety   Upper Body Dressing : Modified independent   Lower Body Dressing: Set up   Toilet Transfer: Supervision/safety           Functional mobility during ADLs: Rolling walker;Min guard General ADL Comments: required several rest breaks during bathing task, requiring use of shower chair to conserve energy     Vision       Perception     Praxis      Cognition Arousal/Alertness: Awake/alert Behavior During Therapy: WFL for tasks assessed/performed Overall Cognitive Status: Within Functional Limits for tasks assessed                                          Exercises     Shoulder Instructions       General Comments      Pertinent Vitals/ Pain       Pain Assessment: No/denies pain  Home Living  Prior Functioning/Environment              Frequency           Progress Toward Goals  OT Goals(current goals can now be found in the care plan section)  Progress towards OT goals: Progressing toward goals     Plan      Co-evaluation                 AM-PAC OT "6 Clicks" Daily Activity     Outcome Measure   Help from another person eating meals?: None Help from another person taking care of personal grooming?: A Little Help from another person toileting, which includes using toliet, bedpan, or urinal?: None Help from another person bathing (including washing, rinsing, drying)?: A Little Help from another person to put on and taking off regular upper body clothing?: None Help from another person to put on and taking off regular lower body clothing?: A Little 6 Click Score: 21    End of Session Equipment Utilized During Treatment: Gait belt;Oxygen      Activity Tolerance Patient tolerated treatment well    Patient Left in chair;with call bell/phone within reach   Nurse Communication Mobility status(O2 stats)        Time: [id number] OT Time Calculation (min): 72 min  Charges: OT General Charges $OT Visit: 1 Visit OT Treatments $Self Care/Home Management : 38-52 mins $Therapeutic Activity: 23-37 mins  Howard Mcmahon OTR/L    Howard Mcmahon 10/19/2019, 4:10 PM

## 2019-10-19 NOTE — Progress Notes (Signed)
PROGRESS NOTE  Howard Mcmahon [id number] DOB: 09-14-1970 DOA: 10/13/2019  PCP: Patient, No Pcp Per  Brief History/Interval Summary: 49 y.o. male with medical history significant of diabetes, essential hypertension, CAD  that is post CABG in 2019, presented to the emergency department with complaints of generalized weakness, vomiting, poor oral intake.  He was diagnosed with Covid test 19 infection on 10/10/2019.  He also noted that his blood sugars were running high.  He was having several episodes of vomiting, severe generalized weakness and diarrhoea.  He was not able to tolerate anything by mouth.    He was found to be dehydrated.  Was hospitalized for further management.  Chest x-ray showed right lower lobe infiltrate.  Reason for Visit: Acute renal failure.  Consultants: None  Procedures: None  Antibiotics: Anti-infectives (From admission, onward)   Start     Dose/Rate Route Frequency Ordered Stop   10/14/19 1000  remdesivir 100 mg in sodium chloride 0.9 % 100 mL IVPB  Status:  Discontinued     100 mg 200 mL/hr over 30 Minutes Intravenous Daily 10/13/19 1832 10/13/19 1845   10/14/19 1000  remdesivir 100 mg in sodium chloride 0.9 % 100 mL IVPB     100 mg 200 mL/hr over 30 Minutes Intravenous Daily 10/13/19 1901 10/17/19 1030   10/13/19 2000  remdesivir 200 mg in sodium chloride 0.9% 250 mL IVPB  Status:  Discontinued     200 mg 580 mL/hr over 30 Minutes Intravenous Once 10/13/19 1832 10/13/19 1845   10/13/19 1915  remdesivir 200 mg in sodium chloride 0.9% 250 mL IVPB     200 mg 580 mL/hr over 30 Minutes Intravenous Once 10/13/19 1901 10/13/19 2355      Subjective/Interval History: Patient states that he is feeling much better.  Had multiple bowel movements yesterday.  Denies any nausea this morning.  Shortness of breath mainly with exertion.  No chest pain.       Assessment/Plan:  Pneumonia due to COVID-19   Recent Labs  Lab 10/14/19 0743 10/15/19 0250 10/16/19 0149  10/17/19 0119 10/18/19 0313  DDIMER 1.23* 0.87* 0.59* 0.61* 0.73*  FERRITIN 4,067* 2,288* 1,684* 1,416* 1,164*  CRP 7.7* 5.3* 3.0* 3.1* 4.4*  ALT 29 27 27 24 24     Objective findings: Fever: Remains afebrile Oxygen requirements: Room air.  Saturating in the early 90s.  Tends to desaturate with exertion.     COVID 19 Therapeutics: Antibacterials: None Remdesivir: Completed course on 2/6 Steroids: Dexamethasone 6 mg daily Diuretics: Holding diuretics Actemra: Not given Convalescent Plasma: Not given yet Vitamin C and Zinc: Continue PUD Prophylaxis: Pepcid DVT Prophylaxis: Subcutaneous heparin  Seems to be stable from a respiratory standpoint.  Tends to get dyspneic with exertion.  Will likely need home oxygen.  He has completed course of remdesivir.  Remains on steroids.  Inflammatory markers have improved.  Continue to mobilize.  Anticipate discharge in 24 to 48 hours.  Acute kidney injury Baseline creatinine appears to be around 1.4-1.5.  Patient presented with a creatinine of 2.56.  Patient was gently hydrated.  Renal function is back to baseline.  Monitor urine output.  Bicarbonate level is better as well.  Continue sodium bicarbonate.  Replace potassium.  Lower abdominal discomfort secondary to constipation Patient was given laxatives resulting in multiple BMs.  He feels much better this morning.  Continue to monitor.  Abdominal x-ray was unremarkable.    Hyponatremia Likely due to dehydration.  Improved with hydration.  Diabetes mellitus type 2, uncontrolled  with hyperglycemia HbA1c 12.6 implying very poor control of diabetes.  Patient supposedly on Lantus at home however he has not been able to afford his medications in the last 2 months.  This is the likely reason for his elevated HbA1c.  He will need more affordable insulin at discharge.  He will need to be discharged on 70/30 insulin.  Dose of Lantus was decreased yesterday.  Continue to monitor CBGs.  Hyperlipidemia On  statin.  History of coronary artery disease status post CABG Stable.  Continue with antiplatelet agents.  On aspirin and Plavix.  Home medication list did show metoprolol.  However it looks like this was discontinued by an outpatient provider.  We will continue to hold and let this be addressed by outpatient practitioners.  Continue to hold lisinopril due to renal failure.  Continue statin.    Essential hypertension Continue to monitor blood pressures.  Leukopenia Due to COVID-19.  Resolved.  Normocytic anemia Likely anemia of chronic disease.  No evidence of overt bleeding.  Continue to monitor.  Hemoglobin remains stable  Positive blood cultures Diphtheroids noted in one set of blood culture.  Appears to be contaminant as patient remains afebrile and without leukocytosis.  DVT Prophylaxis: Subcutaneous heparin Code Status: Full code Family Communication: Discussed with patient. Disposition Plan: Home health.  Home oxygen will be needed.  Anticipate discharge tomorrow.   Medications:  Scheduled: . vitamin C  500 mg Oral Daily  . aspirin EC  81 mg Oral Daily  . atorvastatin  80 mg Oral q1800  . clopidogrel  75 mg Oral Q breakfast  . dexamethasone  6 mg Oral Daily  . famotidine  20 mg Oral Daily  . heparin  5,000 Units Subcutaneous Q8H  . insulin aspart  0-20 Units Subcutaneous TID WC  . insulin aspart  0-5 Units Subcutaneous QHS  . insulin glargine  25 Units Subcutaneous Daily  . Ensure Max Protein  11 oz Oral BID  . sodium bicarbonate  1,300 mg Oral TID  . zinc sulfate  220 mg Oral Daily   Continuous:  [id number], guaiFENesin-dextromethorphan   Objective:  Vital Signs  Vitals:   10/18/19 1953 10/19/19 0434 10/19/19 0445 10/19/19 0801  BP: (!) 129/92 128/86  105/78  Pulse: 94 (!) 103  (!) 103  Resp: 18 18  19   Temp: 98.6 F (37 C) (!) 97.3 F (36.3 C)  99 F (37.2 C)  TempSrc: Oral Axillary  Oral  SpO2: 95% (!) 85% 91% 91%  Weight:      Height:          Intake/Output Summary (Last 24 hours) at 10/19/2019 1209 Last data filed at 10/19/2019 1053 Gross per 24 hour  Intake --  Output 450 ml  Net -450 ml   Filed Weights   10/13/19 1440 10/14/19 0133  Weight: 117.9 kg 111.6 kg    General appearance: Awake alert.  In no distress Resp: Improved effort.  Few crackles bilateral bases.  No wheezing or rhonchi. Cardio: S1-S2 is normal regular.  No S3-S4.  No rubs murmurs or bruit GI: Abdomen is soft.  Nontender nondistended.  Bowel sounds are present normal.  No masses organomegaly Extremities: No edema.  Full range of motion of lower extremities. Neurologic: Alert and oriented x3.  No focal neurological deficits.      Lab Results:  Data Reviewed: I have personally reviewed following labs and imaging studies  CBC: Recent Labs  Lab 10/14/19 0743 10/15/19 0250 10/16/19 0149 10/17/19 0119 10/18/19 [id number]  WBC 2.7* 3.3* 3.8* 5.0 9.1  NEUTROABS 1.9 2.0 2.4 2.7 5.7  HGB 11.8* 11.3* 11.5* 12.1* 12.2*  HCT 35.2* 33.2* 34.5* 36.6* 36.5*  MCV 93.6 94.1 93.8 93.6 94.3  PLT 136* 142* 176 226 [id number]    Basic Metabolic Panel: Recent Labs  Lab 10/15/19 0250 10/16/19 0149 10/17/19 0119 10/18/19 0313 10/19/19 0300  NA 136 138 138 140 142  K 3.9 3.9 3.7 3.6 3.4*  CL 106 111 106 110 111  CO2 17* 17* 15* 17* 17*  GLUCOSE 289* 277* 150* 67* 106*  BUN 46* 41* 35* 33* 35*  CREATININE 2.09* 1.77* 1.42* 1.43* 1.42*  CALCIUM 7.9* 8.3* 8.1* 8.4* 8.5*    GFR: Estimated Creatinine Clearance: 88 mL/min (A) (by C-G formula based on SCr of 1.42 mg/dL (H)).  Liver Function Tests: Recent Labs  Lab 10/14/19 0743 10/15/19 0250 10/16/19 0149 10/17/19 0119 10/18/19 0313  AST 38 35 31 30 27   ALT 29 27 27 24 24   ALKPHOS 78 70 78 79 87  BILITOT 0.2* 0.6 0.6 0.7 1.0  PROT 7.1 6.7 6.8 7.1 7.3  ALBUMIN 2.5* 2.4* 2.3* 2.5* 2.5*    HbA1C: No results for input(s): HGBA1C in the last 72 hours.  CBG: Recent Labs  Lab 10/18/19 0810  10/18/19 1145 10/18/19 1709 10/18/19 1953 10/19/19 0819  GLUCAP 90 126* 302* 273* 130*     Anemia Panel: Recent Labs    10/17/19 0119 10/18/19 0313  FERRITIN 1,416* 1,164*    Recent Results (from the past 240 hour(s))  Culture, blood (routine x 2)     Status: None   Collection Time: 10/09/19 11:02 PM   Specimen: BLOOD RIGHT FOREARM  Result Value Ref Range Status   Specimen Description BLOOD RIGHT FOREARM  Final   Special Requests   Final    BOTTLES DRAWN AEROBIC AND ANAEROBIC Blood Culture adequate volume   Culture   Final    NO GROWTH 5 DAYS Performed at [facility], [place] [address], [place], [place] [address]    Report Status 10/15/2019 FINAL  Final  Culture, blood (routine x 2)     Status: Abnormal   Collection Time: 10/09/19 11:25 PM   Specimen: BLOOD LEFT FOREARM  Result Value Ref Range Status   Specimen Description BLOOD LEFT FOREARM  Final   Special Requests   Final    BOTTLES DRAWN AEROBIC AND ANAEROBIC Blood Culture adequate volume   Culture  Setup Time   Final    GRAM POSITIVE RODS IN BOTH AEROBIC AND ANAEROBIC BOTTLES CRITICAL RESULT CALLED TO, READ BACK BY AND VERIFIED WITH: Reynolds Bowl RN, AT [phone] 10/11/19 BY D. VANHOOK    Culture (A)  Final    DIPHTHEROIDS(CORYNEBACTERIUM SPECIES) Standardized susceptibility testing for this organism is not available. Performed at [facility], [place] [address] [place], [place] [address]    Report Status 10/15/2019 FINAL  Final  Urine culture     Status: Abnormal   Collection Time: 10/10/19 12:56 AM   Specimen: Urine, Clean Catch  Result Value Ref Range Status   Specimen Description URINE, CLEAN CATCH  Final   Special Requests   Final    NONE Performed at [facility], [place] [address] [place], [place] [address]    Culture MULTIPLE SPECIES PRESENT, SUGGEST RECOLLECTION (A)  Final   Report Status 10/11/2019 FINAL  Final      Radiology Studies: DG Abd 1 View  Result Date: 10/18/2019 CLINICAL  DATA:  Nausea and vomiting. COVID pneumonia. EXAM: ABDOMEN - 1 VIEW COMPARISON:  No comparison studies available. FINDINGS: Supine abdomen shows no gaseous small bowel dilatation to suggest obstruction. There is some gas visible in the transverse and proximal descending colon, nondilated. No unexpected abdominopelvic calcification. Visualized bony anatomy unremarkable. Patchy airspace disease noted in the the visualized lung bases, right greater than left. IMPRESSION: 1. Nonobstructive bowel gas pattern. 2. Patchy airspace disease in the lung bases, right greater than left. Electronically Signed   By: Misty Stanley M.D.   On: 10/18/2019 12:58   DG Chest Port 1 View  Result Date: 10/18/2019 CLINICAL DATA:  49 year old male with a history of COVID pneumonia EXAM: PORTABLE CHEST 1 VIEW COMPARISON:  Multiple prior most recent 10/13/2019 FINDINGS: Cardiomediastinal silhouette unchanged in size and contour. Surgical changes of median sternotomy and CABG. Reticulonodular opacities of the bilateral lung bases, similar to slightly worsened then to comparison. Plain film. No pneumothorax. No large pleural effusion. IMPRESSION: Persisting reticulonodular opacities compatible with COVID pneumonia, similar to slightly worse than the comparison. Surgical changes of median sternotomy and CABG Electronically Signed   By: Corrie Mckusick D.O.   On: 10/18/2019 12:34       LOS: 6 days   [facility] Pager on [email or web address]  10/19/2019, 12:09 PM

## 2019-10-20 LAB — BASIC METABOLIC PANEL
Anion gap: 10 (ref 5–15)
BUN: 32 mg/dL — ABNORMAL HIGH (ref 6–20)
CO2: 19 mmol/L — ABNORMAL LOW (ref 22–32)
Calcium: 8.5 mg/dL — ABNORMAL LOW (ref 8.9–10.3)
Chloride: 112 mmol/L — ABNORMAL HIGH (ref 98–111)
Creatinine, Ser: 1.5 mg/dL — ABNORMAL HIGH (ref 0.61–1.24)
GFR calc Af Amer: >60 mL/min (ref >60)
GFR calc non Af Amer: 55 mL/min — ABNORMAL LOW (ref >60)
Glucose, Bld: 108 mg/dL — ABNORMAL HIGH (ref 70–99)
Potassium: 3.6 mmol/L (ref 3.5–5.1)
Sodium: 141 mmol/L (ref 135–145)

## 2019-10-20 LAB — GLUCOSE, CAPILLARY
Glucose-Capillary: 83 mg/dL (ref 70–99)
Glucose-Capillary: 109 mg/dL — ABNORMAL HIGH (ref 70–99)
Glucose-Capillary: 124 mg/dL — ABNORMAL HIGH (ref 70–99)

## 2019-10-20 MED ORDER — METFORMIN HCL ER 500 MG PO TB24: 500.0000 mg | ORAL_TABLET | Freq: Every day | ORAL | 1 refills | Status: DC

## 2019-10-20 MED ORDER — ATORVASTATIN CALCIUM 80 MG PO TABS: 80.0000 mg | ORAL_TABLET | Freq: Every day | ORAL | 0 refills | Status: DC

## 2019-10-20 MED ORDER — DEXAMETHASONE 2 MG PO TABS: ORAL_TABLET | ORAL | 0 refills | Status: DC

## 2019-10-20 MED ORDER — SODIUM BICARBONATE 650 MG PO TABS: 650.0000 mg | ORAL_TABLET | Freq: Two times a day (BID) | ORAL | 0 refills | Status: AC

## 2019-10-20 MED ORDER — INSULIN PEN NEEDLE 30G X 8 MM MISC: 1.0000 | 1 refills | Status: DC | PRN

## 2019-10-20 MED ORDER — INSULIN GLARGINE 100 UNIT/ML ~~LOC~~ SOLN: 20.0000 [IU] | Freq: Every day | SUBCUTANEOUS | Status: DC

## 2019-10-20 MED ORDER — CLOPIDOGREL BISULFATE 75 MG PO TABS: 75.0000 mg | ORAL_TABLET | Freq: Every day | ORAL | 0 refills | Status: DC

## 2019-10-20 MED ORDER — NOVOLIN 70/30 FLEXPEN RELION (70-30) 100 UNIT/ML ~~LOC~~ SUPN: 18.0000 [IU] | PEN_INJECTOR | Freq: Two times a day (BID) | SUBCUTANEOUS | 1 refills | Status: DC

## 2019-10-20 MED ORDER — PANTOPRAZOLE SODIUM 40 MG PO TBEC: 40.0000 mg | DELAYED_RELEASE_TABLET | Freq: Two times a day (BID) | ORAL | 0 refills | Status: DC

## 2019-10-20 MED FILL — ATORVASTATIN 80 MG TABLET: 80 | 30 days supply | Qty: 30 | Fill #0

## 2019-10-20 MED FILL — SODIUM BICARB 650 MG TABLET: 650 | 10 days supply | Qty: 20 | Fill #0

## 2019-10-20 MED FILL — NOVOLIN 70/30 FLEXPEN (70-3: (70-30) 100 | 16 days supply | Qty: 6 | Fill #0

## 2019-10-20 MED FILL — CLOPIDOGREL 75 MG TABLET: 75 | 30 days supply | Qty: 30 | Fill #0

## 2019-10-20 MED FILL — PANTOPRAZOLE SOD DR 40 MG T: 40 | 15 days supply | Qty: 30 | Fill #0

## 2019-10-20 MED FILL — DEXAMETHASONE 2 MG TABLET: 2 | 6 days supply | Qty: 9 | Fill #0

## 2019-10-20 MED FILL — UNIFINE PENTIPS 8MM 31G: 31G X 8 MM | 30 days supply | Qty: 300 | Fill #0

## 2019-10-20 MED FILL — metFORMIN HCL ER 500 MG TB2: 500 | 30 days supply | Qty: 30 | Fill #0

## 2019-10-20 NOTE — Progress Notes (Signed)
Physical Therapy Treatment Patient Details Name: Rodnie Middendorf MRN: [id number] DOB: 05-26-1971 Today's Date: 10/20/2019    History of Present Illness 49 y.o. male with medical history significant of diabetes, essential hypertension, CAD  that is post CABG in 2019, presented to the emergency department with complaints of generalized weakness, vomiting, poor oral intake.  He was diagnosed with Covid test 19 infection on 10/10/2019.  He also noted that his blood sugars were running high.  He was having several episodes of vomiting, severe generalized weakness and diarrhoea.    PT Comments    Pt was agreeable only to education and there ex today as he stated he did not feel like ambulating with PT currently. Pt demonstrated fair balance requiring UE support and minA to keep balance again moderate perturbation, signalling need for RW & 3-in-1 for home discharge for safe ambulation for ADLs. Pt verbalized & demonstrated understanding to updated HEP (standing exercises & diaphragmatic breathing). Pt gave mother's phone number for RN/ Pharmacy in order to discharge today. RN notified of mobility status and phone number per request. Pt remains appropriate for further PT follow up due to fall risk, generalized weakness & reduced activity tolerance.  Follow Up Recommendations  Home health PT     Equipment Recommendations  3in1 (PT);Rolling walker with 5" wheels    Recommendations for Other Services       Precautions / Restrictions Precautions Precautions: Fall Precaution Comments: monitor sats  Restrictions Weight Bearing Restrictions: No    Mobility  Bed Mobility Overal bed mobility: Modified Independent Bed Mobility: Supine to Sit     Supine to sit: Modified independent (Device/Increase time)        Transfers Overall transfer level: Modified independent Equipment used: None Transfers: Sit to/from Stand Sit to Stand: Supervision Stand pivot transfers: Modified independent  (Device/Increase time)       General transfer comment: Pt demonstrates mild balance deficits not being able to withstand against moderate external anterior perturbation today.  Ambulation/Gait Ambulation/Gait assistance: (refused)               Stairs             Wheelchair Mobility    Modified Rankin (Stroke Patients Only)       Balance Overall balance assessment: Needs assistance Sitting-balance support: Feet supported Sitting balance-Leahy Scale: Normal     Standing balance support: During functional activity;Bilateral upper extremity supported Standing balance-Leahy Scale: Fair Standing balance comment: Static balance, unable to hold against anterior moderate perturbation                            Cognition Arousal/Alertness: Awake/alert Behavior During Therapy: WFL for tasks assessed/performed Overall Cognitive Status: Within Functional Limits for tasks assessed                                 General Comments: seems to be his baseline      Exercises General Exercises - Lower Extremity Hip ABduction/ADduction: Strengthening;Both;10 reps Toe Raises: Strengthening;Both;10 reps Mini-Sqauts: Strengthening;Both;10 reps Other Exercises Other Exercises: Diaphragmatic breathing Other Exercises: pursed lip breathing encouraged throughout    General Comments        Pertinent Vitals/Pain Pain Assessment: No/denies pain Pain Score: 0-No pain    Home Living                      Prior Function  PT Goals (current goals can now be found in the care plan section) Acute Rehab PT Goals Patient Stated Goal: to go home but states will need some assist PT Goal Formulation: With patient Time For Goal Achievement: 10/29/19 Potential to Achieve Goals: Good Progress towards PT goals: Progressing toward goals    Frequency    Min 3X/week      PT Plan Current plan remains appropriate    Co-evaluation               AM-PAC PT "6 Clicks" Mobility   Outcome Measure  Help needed turning from your back to your side while in a flat bed without using bedrails?: None Help needed moving from lying on your back to sitting on the side of a flat bed without using bedrails?: None Help needed moving to and from a bed to a chair (including a wheelchair)?: A Little Help needed standing up from a chair using your arms (e.g., wheelchair or bedside chair)?: A Little Help needed to walk in hospital room?: A Little Help needed climbing 3-5 steps with a railing? : A Lot 6 Click Score: 19    End of Session   Activity Tolerance: Patient limited by fatigue;Patient limited by lethargy;Treatment limited secondary to medical complications (Comment) Patient left: in chair;with call bell/phone within reach Nurse Communication: Mobility status;Other (comment)(Mom phone left with RN - [phone]) PT Visit Diagnosis: Unsteadiness on feet (R26.81);Other abnormalities of gait and mobility (R26.89);Muscle weakness (generalized) (M62.81)     Time: [id number] PT Time Calculation (min) (ACUTE ONLY): 12 min  Charges:  $Therapeutic Exercise: 8-22 mins                    Ann Held PT, DPT Acute Rehab [place] [facility]: [place] 10/20/2019, 2:33 PM

## 2019-10-20 NOTE — Discharge Instructions (Signed)
Carbohydrate Counting For People With Diabetes  Foods with carbohydrates make your blood glucose level go up. Learning how to count carbohydrates can help you control your blood glucose levels. First, identify the foods you eat that contain carbohydrates. Then, using the Foods with Carbohydrates chart, determine about how much carbohydrates are in your meals and snacks. Make sure you are eating foods with fiber, protein, and healthy fat along with your carbohydrate foods. Foods with Carbohydrates The following table shows carbohydrate foods that have about 15 grams of carbohydrate each. Using measuring cups, spoons, or a food scale when you first begin learning about carbohydrate counting can help you learn about the portion sizes you typically eat. The following foods have 15 grams carbohydrate each:  Grains . 1 slice bread (1 ounce)  . 1 small tortilla (6-inch size)  .  large bagel (1 ounce)  . 1/3 cup pasta or rice (cooked)  .  hamburger or hot dog bun ( ounce)  .  cup cooked cereal  .  to  cup ready-to-eat cereal  . 2 taco shells (5-inch size) Fruit . 1 small fresh fruit ( to 1 cup)  .  medium banana  . 17 small grapes (3 ounces)  . 1 cup melon or berries  .  cup canned or frozen fruit  . 2 tablespoons dried fruit (blueberries, cherries, cranberries, raisins)  .  cup unsweetened fruit juice  Starchy Vegetables .  cup cooked beans, peas, corn, potatoes/sweet potatoes  .  large baked potato (3 ounces)  . 1 cup acorn or butternut squash  Snack Foods . 3 to 6 crackers  . 8 potato chips or 13 tortilla chips ( ounce to 1 ounce)  . 3 cups popped popcorn  Dairy . 3/4 cup (6 ounces) nonfat plain yogurt, or yogurt with sugar-free sweetener  . 1 cup milk  . 1 cup plain rice, soy, coconut or flavored almond milk Sweets and Desserts .  cup ice cream or frozen yogurt  . 1 tablespoon jam, jelly, pancake syrup, table sugar, or honey  . 2 tablespoons light pancake syrup  . 1 inch  square of frosted cake or 2 inch square of unfrosted cake  . 2 small cookies (2/3 ounce each) or  large cookie  Sometimes you'll have to estimate carbohydrate amounts if you don't know the exact recipe. One cup of mixed foods like soups can have 1 to 2 carbohydrate servings, while some casseroles might have 2 or more servings of carbohydrate. Foods that have less than 20 calories in each serving can be counted as "free" foods. Count 1 cup raw vegetables, or  cup cooked non-starchy vegetables as "free" foods. If you eat 3 or more servings at one meal, then count them as 1 carbohydrate serving.  Foods without Carbohydrates  Not all foods contain carbohydrates. Meat, some dairy, fats, non-starchy vegetables, and many beverages don't contain carbohydrate. So when you count carbohydrates, you can generally exclude chicken, pork, beef, fish, seafood, eggs, tofu, cheese, butter, sour cream, avocado, nuts, seeds, olives, mayonnaise, water, black coffee, unsweetened tea, and zero-calorie drinks. Vegetables with no or low carbohydrate include green beans, cauliflower, tomatoes, and onions. How much carbohydrate should I eat at each meal?  Carbohydrate counting can help you plan your meals and manage your weight. Following are some starting points for carbohydrate intake at each meal. Work with your registered dietitian nutritionist to find the best range that works for your blood glucose and weight.   To Lose Weight To  Maintain Weight  Women 2 - 3 carb servings 3 - 4 carb servings  Men 3 - 4 carb servings 4 - 5 carb servings  Checking your blood glucose after meals will help you know if you need to adjust the timing, type, or number of carbohydrate servings in your meal plan. Achieve and keep a healthy body weight by balancing your food intake and physical activity.  Tips How should I plan my meals?  Plan for half the food on your plate to include non-starchy vegetables, like salad greens, broccoli, or  carrots. Try to eat 3 to 5 servings of non-starchy vegetables every day. Have a protein food at each meal. Protein foods include chicken, fish, meat, eggs, or beans (note that beans contain carbohydrate). These two food groups (non-starchy vegetables and proteins) are low in carbohydrate. If you fill up your plate with these foods, you will eat less carbohydrate but still fill up your stomach. Try to limit your carbohydrate portion to  of the plate.  What fats are healthiest to eat?  Diabetes increases risk for heart disease. To help protect your heart, eat more healthy fats, such as olive oil, nuts, and avocado. Eat less saturated fats like butter, cream, and high-fat meats, like bacon and sausage. Avoid trans fats, which are in all foods that list "partially hydrogenated oil" as an ingredient. What should I drink?  Choose drinks that are not sweetened with sugar. The healthiest choices are water, carbonated or seltzer waters, and tea and coffee without added sugars.  Sweet drinks will make your blood glucose go up very quickly. One serving of soda or energy drink is  cup. It is best to drink these beverages only if your blood glucose is low.  Artificially sweetened, or diet drinks, typically do not increase your blood glucose if they have zero calories in them. Read labels of beverages, as some diet drinks do have carbohydrate and will raise your blood glucose. Label Reading Tips Read Nutrition Facts labels to find out how many grams of carbohydrate are in a food you want to eat. Don't forget: sometimes serving sizes on the label aren't the same as how much food you are going to eat, so you may need to calculate how much carbohydrate is in the food you are serving yourself.   Carbohydrate Counting for People with Diabetes Sample 1-Day Menu  Breakfast  cup yogurt, low fat, low sugar (1 carbohydrate serving)   cup cereal, ready-to-eat, unsweetened (1 carbohydrate serving)  1 cup strawberries (1  carbohydrate serving)   cup almonds ( carbohydrate serving)  Lunch 1, 5 ounce can chunk light tuna  2 ounces cheese, low fat cheddar  6 whole wheat crackers (1 carbohydrate serving)  1 small apple (1 carbohydrate servings)   cup carrots ( carbohydrate serving)   cup snap peas  1 cup 1% milk (1 carbohydrate serving)   Evening Meal Stir fry made with: 3 ounces chicken  1 cup brown rice (3 carbohydrate servings)   cup broccoli ( carbohydrate serving)   cup green beans   cup onions  1 tablespoon olive oil  2 tablespoons teriyaki sauce ( carbohydrate serving)  Evening Snack 1 extra small banana (1 carbohydrate serving)  1 tablespoon peanut butter   Carbohydrate Counting for People with Diabetes Vegan Sample 1-Day Menu  Breakfast 1 cup cooked oatmeal (2 carbohydrate servings)   cup blueberries (1 carbohydrate serving)  2 tablespoons flaxseeds  1 cup soymilk fortified with calcium and vitamin D  1  cup coffee  Lunch 2 slices whole wheat bread (2 carbohydrate servings)   cup baked tofu   cup lettuce  2 slices tomato  2 slices avocado   cup baby carrots ( carbohydrate serving)  1 orange (1 carbohydrate serving)  1 cup soymilk fortified with calcium and vitamin D   Evening Meal Burrito made with: 1 6-inch corn tortilla (1 carbohydrate serving)  1 cup refried vegetarian beans (2 carbohydrate servings)   cup chopped tomatoes   cup lettuce   cup salsa  1/3 cup brown rice (1 carbohydrate serving)  1 tablespoon olive oil for rice   cup zucchini   Evening Snack 6 small whole grain crackers (1 carbohydrate serving)  2 apricots ( carbohydrate serving)   cup unsalted peanuts ( carbohydrate serving)    Carbohydrate Counting for People with Diabetes Vegetarian (Lacto-Ovo) Sample 1-Day Menu  Breakfast 1 cup cooked oatmeal (2 carbohydrate servings)   cup blueberries (1 carbohydrate serving)  2 tablespoons flaxseeds  1 egg  1 cup 1% milk (1 carbohydrate serving)  1  cup coffee  Lunch 2 slices whole wheat bread (2 carbohydrate servings)  2 ounces low-fat cheese   cup lettuce  2 slices tomato  2 slices avocado   cup baby carrots ( carbohydrate serving)  1 orange (1 carbohydrate serving)  1 cup unsweetened tea  Evening Meal Burrito made with: 1 6-inch corn tortilla (1 carbohydrate serving)   cup refried vegetarian beans (1 carbohydrate serving)   cup tomatoes   cup lettuce   cup salsa  1/3 cup brown rice (1 carbohydrate serving)  1 tablespoon olive oil for rice   cup zucchini  1 cup 1% milk (1 carbohydrate serving)  Evening Snack 6 small whole grain crackers (1 carbohydrate serving)  2 apricots ( carbohydrate serving)   cup unsalted peanuts ( carbohydrate serving)    Copyright 2020  Academy of [employer] Dietetics. All rights reserved.  Using Nutrition Labels: Carbohydrate  . Serving Size  . Look at the serving size. All the information on the label is based on this portion. Randol Kern Per Container  . The number of servings contained in the package. . Guidelines for Carbohydrate  . Look at the total grams of carbohydrate in the serving size.  . 1 carbohydrate choice = 15 grams of carbohydrate. Range of Carbohydrate Grams Per Choice  Carbohydrate Grams/Choice Carbohydrate Choices  6-10   11-20 1  21-25 1  26-35 2  36-40 2  41-50 3  51-55 3  56-65 4  66-70 4  71-80 5    Copyright 2020  Academy of [employer] and Dietetics. All rights reserved.     COVID-19 COVID-19 is a respiratory infection that is caused by a virus called severe acute respiratory syndrome coronavirus 2 (SARS-CoV-2). The disease is also known as coronavirus disease or novel coronavirus. In some people, the virus may not cause any symptoms. In others, it may cause a serious infection. The infection can get worse quickly and can lead to complications, such as:  Pneumonia, or infection of the lungs.  Acute respiratory distress syndrome or  ARDS. This is a condition in which fluid build-up in the lungs prevents the lungs from filling with air and passing oxygen into the blood.  Acute respiratory failure. This is a condition in which there is not enough oxygen passing from the lungs to the body or when carbon dioxide is not passing from the lungs out of the body.  Sepsis or septic shock. This  is a serious bodily reaction to an infection.  Blood clotting problems.  Secondary infections due to bacteria or fungus.  Organ failure. This is when your body's organs stop working. The virus that causes COVID-19 is contagious. This means that it can spread from person to person through droplets from coughs and sneezes (respiratory secretions). What are the causes? This illness is caused by a virus. You may catch the virus by:  Breathing in droplets from an infected person. Droplets can be spread by a person breathing, speaking, singing, coughing, or sneezing.  Touching something, like a table or a doorknob, that was exposed to the virus (contaminated) and then touching your mouth, nose, or eyes. What increases the risk? Risk for infection You are more likely to be infected with this virus if you:  Are within 6 feet (2 meters) of a person with COVID-19.  Provide care for or live with a person who is infected with COVID-19.  Spend time in crowded indoor spaces or live in shared housing. Risk for serious illness You are more likely to become seriously ill from the virus if you:  Are 40 years of age or older. The higher your age, the more you are at risk for serious illness.  Live in a nursing home or long-term care facility.  Have cancer.  Have a long-term (chronic) disease such as: ? Chronic lung disease, including chronic obstructive pulmonary disease or asthma. ? A long-term disease that lowers your body's ability to fight infection (immunocompromised). ? Heart disease, including heart failure, a condition in which the  arteries that lead to the heart become narrow or blocked (coronary artery disease), a disease which makes the heart muscle thick, weak, or stiff (cardiomyopathy). ? Diabetes. ? Chronic kidney disease. ? Sickle cell disease, a condition in which red blood cells have an abnormal "sickle" shape. ? Liver disease.  Are obese. What are the signs or symptoms? Symptoms of this condition can range from mild to severe. Symptoms may appear any time from 2 to 14 days after being exposed to the virus. They include:  A fever or chills.  A cough.  Difficulty breathing.  Headaches, body aches, or muscle aches.  Runny or stuffy (congested) nose.  A sore throat.  New loss of taste or smell. Some people may also have stomach problems, such as nausea, vomiting, or diarrhea. Other people may not have any symptoms of COVID-19. How is this diagnosed? This condition may be diagnosed based on:  Your signs and symptoms, especially if: ? You live in an area with a COVID-19 outbreak. ? You recently traveled to or from an area where the virus is common. ? You provide care for or live with a person who was diagnosed with COVID-19. ? You were exposed to a person who was diagnosed with COVID-19.  A physical exam.  Lab tests, which may include: ? Taking a sample of fluid from the back of your nose and throat (nasopharyngeal fluid), your nose, or your throat using a swab. ? A sample of mucus from your lungs (sputum). ? Blood tests.  Imaging tests, which may include, X-rays, CT scan, or ultrasound. How is this treated? At present, there is no medicine to treat COVID-19. Medicines that treat other diseases are being used on a trial basis to see if they are effective against COVID-19. Your health care provider will talk with you about ways to treat your symptoms. For most people, the infection is mild and can be managed at home  with rest, fluids, and over-the-counter medicines. Treatment for a serious  infection usually takes places in a hospital intensive care unit (ICU). It may include one or more of the following treatments. These treatments are given until your symptoms improve.  Receiving fluids and medicines through an IV.  Supplemental oxygen. Extra oxygen is given through a tube in the nose, a face mask, or a hood.  Positioning you to lie on your stomach (prone position). This makes it easier for oxygen to get into the lungs.  Continuous positive airway pressure (CPAP) or bi-level positive airway pressure (BPAP) machine. This treatment uses mild air pressure to keep the airways open. A tube that is connected to a motor delivers oxygen to the body.  Ventilator. This treatment moves air into and out of the lungs by using a tube that is placed in your windpipe.  Tracheostomy. This is a procedure to create a hole in the neck so that a breathing tube can be inserted.  Extracorporeal membrane oxygenation (ECMO). This procedure gives the lungs a chance to recover by taking over the functions of the heart and lungs. It supplies oxygen to the body and removes carbon dioxide. Follow these instructions at home: Lifestyle  If you are sick, stay home except to get medical care. Your health care provider will tell you how long to stay home. Call your health care provider before you go for medical care.  Rest at home as told by your health care provider.  Do not use any products that contain nicotine or tobacco, such as cigarettes, e-cigarettes, and chewing tobacco. If you need help quitting, ask your health care provider.  Return to your normal activities as told by your health care provider. Ask your health care provider what activities are safe for you. General instructions  Take over-the-counter and prescription medicines only as told by your health care provider.  Drink enough fluid to keep your urine pale yellow.  Keep all follow-up visits as told by your health care provider. This is  important. How is this prevented?  There is no vaccine to help prevent COVID-19 infection. However, there are steps you can take to protect yourself and others from this virus. To protect yourself:   Do not travel to areas where COVID-19 is a risk. The areas where COVID-19 is reported change often. To identify high-risk areas and travel restrictions, check the CDC travel website: FatFares.com.br  If you live in, or must travel to, an area where COVID-19 is a risk, take precautions to avoid infection. ? Stay away from people who are sick. ? Wash your hands often with soap and water for 20 seconds. If soap and water are not available, use an alcohol-based hand sanitizer. ? Avoid touching your mouth, face, eyes, or nose. ? Avoid going out in public, follow guidance from your state and local health authorities. ? If you must go out in public, wear a cloth face covering or face mask. Make sure your mask covers your nose and mouth. ? Avoid crowded indoor spaces. Stay at least 6 feet (2 meters) away from others. ? Disinfect objects and surfaces that are frequently touched every day. This may include:  Counters and tables.  Doorknobs and light switches.  Sinks and faucets.  Electronics, such as phones, remote controls, keyboards, computers, and tablets. To protect others: If you have symptoms of COVID-19, take steps to prevent the virus from spreading to others.  If you think you have a COVID-19 infection, contact your health care  provider right away. Tell your health care team that you think you may have a COVID-19 infection.  Stay home. Leave your house only to seek medical care. Do not use public transport.  Do not travel while you are sick.  Wash your hands often with soap and water for 20 seconds. If soap and water are not available, use alcohol-based hand sanitizer.  Stay away from other members of your household. Let healthy household members care for children and pets,  if possible. If you have to care for children or pets, wash your hands often and wear a mask. If possible, stay in your own room, separate from others. Use a different bathroom.  Make sure that all people in your household wash their hands well and often.  Cough or sneeze into a tissue or your sleeve or elbow. Do not cough or sneeze into your hand or into the air.  Wear a cloth face covering or face mask. Make sure your mask covers your nose and mouth. Where to find more information  Centers for Disease Control and Prevention: PurpleGadgets.be  World Health Organization: https://www.castaneda.info/ Contact a health care provider if:  You live in or have traveled to an area where COVID-19 is a risk and you have symptoms of the infection.  You have had contact with someone who has COVID-19 and you have symptoms of the infection. Get help right away if:  You have trouble breathing.  You have pain or pressure in your chest.  You have confusion.  You have bluish lips and fingernails.  You have difficulty waking from sleep.  You have symptoms that get worse. These symptoms may represent a serious problem that is an emergency. Do not wait to see if the symptoms will go away. Get medical help right away. Call your local emergency services (911 in the [place] Do not drive yourself to the hospital. Let the emergency medical personnel know if you think you have COVID-19. Summary  COVID-19 is a respiratory infection that is caused by a virus. It is also known as coronavirus disease or novel coronavirus. It can cause serious infections, such as pneumonia, acute respiratory distress syndrome, acute respiratory failure, or sepsis.  The virus that causes COVID-19 is contagious. This means that it can spread from person to person through droplets from breathing, speaking, singing, coughing, or sneezing.  You are more likely to develop a serious illness if you  are 75 years of age or older, have a weak immune system, live in a nursing home, or have chronic disease.  There is no medicine to treat COVID-19. Your health care provider will talk with you about ways to treat your symptoms.  Take steps to protect yourself and others from infection. Wash your hands often and disinfect objects and surfaces that are frequently touched every day. Stay away from people who are sick and wear a mask if you are sick. This information is not intended to replace advice given to you by your health care provider. Make sure you discuss any questions you have with your health care provider. Document Revised: 06/26/2019 Document Reviewed: 10/02/2018 Elsevier Patient Education  [place].

## 2019-10-20 NOTE — TOC Transition Note (Signed)
Transition of Care [facility]) - CM/SW Discharge Note   Patient Details  Name: Howard Mcmahon MRN: [id number] Date of Birth: 05-Aug-1971  [facility] CM/SW Contact:  Ross Ludwig, LCSW Phone Number: 10/20/2019, 3:31 PM   Clinical Narrative:     Patient will be going home with home health PT through [facility], however [facility] is not able to provide charity OT at this time.  CSW signing off please reconsult with any other social work needs, home health agency has been notified of planned discharge.  Patient's meds are available at [facility].   Final next level of care: [place] Barriers to Discharge: Barriers Resolved   Patient Goals and CMS Choice Patient states their goals for this hospitalization and ongoing recovery are:: To return back home. CMS Medicare.gov Compare Post Acute Care list provided to:: Patient Choice offered to / list presented to : Patient  Discharge Placement                       Discharge Plan and Services                DME Arranged: N/A DME Agency: NA       HH Arranged: PT [place] Agency: [place] ([place]) Date [place]: 10/20/19 Time [place]: [id number] Representative spoke with at [place]: [place] ([place]) Interventions     Readmission Risk Interventions No flowsheet data found.

## 2019-10-20 NOTE — Plan of Care (Signed)
Plan of Care reviewed. 

## 2019-10-20 NOTE — Progress Notes (Signed)
Inpatient Diabetes Program Recommendations  AACE/ADA: New Consensus Statement on Inpatient Glycemic Control (2015)  Target Ranges:  Prepandial:   less than 140 mg/dL      Peak postprandial:   less than 180 mg/dL (1-2 hours)      Critically ill patients:  140 - 180 mg/dL   Lab Results  Component Value Date   GLUCAP 124 (H) 10/20/2019   HGBA1C 12.6 (H) 10/13/2019    Review of Glycemic Control Results for Howard Mcmahon, Howard Mcmahon (MRN [id number]) as of 10/20/2019 13:19  Ref. Range 10/19/2019 16:42 10/19/2019 20:14 10/20/2019 07:31 10/20/2019 10:52 10/20/2019 11:52  Glucose-Capillary Latest Ref Range: 70 - 99 mg/dL 267 (H) 240 (H) 83 109 (H) 124 (H)   Inpatient Diabetes Program Recommendations:   Fasting CBG 83. -Decrease Lantus to 20 units daily  Thank you, Bethena Roys E. Tatia Petrucci, RN, MSN, CDE  Diabetes Coordinator Inpatient Glycemic Control Team Team Pager [phone] (8am-5pm) 10/20/2019 1:21 PM

## 2019-10-20 NOTE — Plan of Care (Signed)
Discharge

## 2019-10-20 NOTE — Discharge Summary (Signed)
[facility]  Physician Discharge Summary   Patient ID: Howard Mcmahon MRN: [id number] DOB/AGE: 09/18/1970 49 y.o.  Admit date: 10/13/2019 Discharge date: 10/20/2019  PCP: Patient, No Pcp Per  DISCHARGE DIAGNOSES:  Pneumonia due to COVID-19 Acute kidney injury, improved Hyponatremia Diabetes mellitus type 2, uncontrolled with hyperglycemia Hyperlipidemia History of coronary artery disease status post CABG Essential hypertension Leukopenia due to COVID-19 Normocytic anemia Positive blood cultures likely contaminant Noncompliance   RECOMMENDATIONS FOR OUTPATIENT FOLLOW UP: 1. Home health ordered. 2. Medications to be arranged through local pharmacy 3. Case management to arrange PCP follow-up   Home Health: PT and OT Equipment/Devices: None  CODE STATUS: Full code  DISCHARGE CONDITION: fair  Diet recommendation: Modified carbohydrate  INITIAL HISTORY: 49 y.o.malewith medical history significant ofdiabetes, essential hypertension, CAD that is post CABG in 2019, presented to the emergency department with complaints of generalized weakness, vomiting, poor oral intake. He was diagnosed with Covid test 19 infection on 10/10/2019. He also noted that his blood sugars were running high. He was having several episodes of vomiting, severe generalized weakness and diarrhoea. He was not able to tolerate anything by mouth.   He was found to be dehydrated.  Was hospitalized for further management.  Chest x-ray showed right lower lobe infiltrate.    HOSPITAL COURSE:   Pneumonia due to COVID-19 Patient was hospitalized.  Placed on remdesivir and steroids.  He is completed course of the remdesivir.  Seems to be improving from a respiratory standpoint.  Ambulated yesterday and did not desaturate.  Feels better.  Wants to go home.    Acute kidney injury Baseline creatinine appears to be around 1.4-1.5.  Patient presented with a creatinine of 2.56.  Patient was gently hydrated.   Renal function is back to baseline. Bicarbonate level is better as well.  Continue sodium bicarbonate for a few more days.  Potassium was repleted.  Lower abdominal discomfort secondary to constipation Patient was given laxatives resulting in multiple BMs.   Abdominal x-ray was unremarkable.    Hyponatremia Likely due to dehydration.  Improved with hydration.  Diabetes mellitus type 2, uncontrolled with hyperglycemia HbA1c 12.6 implying very poor control of diabetes.  Patient supposedly on Lantus at home however he has not been able to afford his medications in the last 2 months.  This is the likely reason for his elevated HbA1c.  He will need more affordable insulin at discharge.  He will need to be discharged on 70/30 insulin.    Hyperlipidemia On statin.  History of coronary artery disease status post CABG Stable.  Continue with antiplatelet agents.  On aspirin and Plavix.  Home medication list did show metoprolol.  However it looks like this was discontinued by an outpatient provider.  We will continue to hold and let this be addressed by outpatient practitioners.  Continue to hold lisinopril due to elevated creatinine.  Continue statin.  Essential hypertension   Leukopenia Due to COVID-19.  Resolved.  Normocytic anemia Likely anemia of chronic disease.  No evidence of overt bleeding.  Hemoglobin remains stable  Positive blood cultures Diphtheroids noted in one set of blood culture.  Appears to be contaminant as patient remains afebrile and without leukocytosis.  Overall stable.  Okay for discharge home today.   PERTINENT LABS:  The results of significant diagnostics from this hospitalization (including imaging, microbiology, ancillary and laboratory) are listed below for reference.      Labs:  COVID-19 Labs  Recent Labs    10/18/19 0313  DDIMER 0.73*  FERRITIN 1,164*  CRP 4.4*    Lab Results  Component Value Date   SARSCOV2NAA Not Detected 03/20/2019    [place] Not Detected 02/06/2019      Basic Metabolic Panel: Recent Labs  Lab 10/16/19 0149 10/17/19 0119 10/18/19 0313 10/19/19 0300 10/20/19 0400  NA 138 138 140 142 141  K 3.9 3.7 3.6 3.4* 3.6  CL 111 106 110 111 112*  CO2 17* 15* 17* 17* 19*  GLUCOSE 277* 150* 67* 106* 108*  BUN 41* 35* 33* 35* 32*  CREATININE 1.77* 1.42* 1.43* 1.42* 1.50*  CALCIUM 8.3* 8.1* 8.4* 8.5* 8.5*   Liver Function Tests: Recent Labs  Lab 10/14/19 0743 10/15/19 0250 10/16/19 0149 10/17/19 0119 10/18/19 0313  AST 38 35 _0 ALT _1 ALKPHOS 78 70 78 79 87  BILITOT 0.2* 0.6 0.6 0.7 1.0  PROT 7.1 6.7 6.8 7.1 7.3  ALBUMIN 2.5* 2.4* 2.3* 2.5* 2.5*   CBC: Recent Labs  Lab 10/14/19 0743 10/15/19 0250 10/16/19 0149 10/17/19 0119 10/18/19 0313  WBC 2.7* 3.3* 3.8* 5.0 9.1  NEUTROABS 1.9 2.0 2.4 2.7 5.7  HGB 11.8* 11.3* 11.5* 12.1* 12.2*  HCT 35.2* 33.2* 34.5* 36.6* 36.5*  MCV 93.6 94.1 93.8 93.6 94.3  PLT 136* 142* 176 226 266   BNP: BNP (last 3 results) Recent Labs    10/09/19 2244  BNP 38.7    CBG: Recent Labs  Lab 10/19/19 1642 10/19/19 2014 10/20/19 0731 10/20/19 1052 10/20/19 1152  GLUCAP 267* 240* 83 109* 124*     IMAGING STUDIES DG Abd 1 View  Result Date: 10/18/2019 CLINICAL DATA:  Nausea and vomiting. COVID pneumonia. EXAM: ABDOMEN - 1 VIEW COMPARISON:  No comparison studies available. FINDINGS: Supine abdomen shows no gaseous small bowel dilatation to suggest obstruction. There is some gas visible in the transverse and proximal descending colon, nondilated. No unexpected abdominopelvic calcification. Visualized bony anatomy unremarkable. Patchy airspace disease noted in the the visualized lung bases, right greater than left. IMPRESSION: 1. Nonobstructive bowel gas pattern. 2. Patchy airspace disease in the lung bases, right greater than left. Electronically Signed   By: Misty Stanley M.D.   On: 10/18/2019 12:58   DG Chest Port 1  View  Result Date: 10/18/2019 CLINICAL DATA:  49 year old male with a history of COVID pneumonia EXAM: PORTABLE CHEST 1 VIEW COMPARISON:  Multiple prior most recent 10/13/2019 FINDINGS: Cardiomediastinal silhouette unchanged in size and contour. Surgical changes of median sternotomy and CABG. Reticulonodular opacities of the bilateral lung bases, similar to slightly worsened then to comparison. Plain film. No pneumothorax. No large pleural effusion. IMPRESSION: Persisting reticulonodular opacities compatible with COVID pneumonia, similar to slightly worse than the comparison. Surgical changes of median sternotomy and CABG Electronically Signed   By: Corrie Mckusick D.O.   On: 10/18/2019 12:34   DG Chest Portable 1 View  Result Date: 10/13/2019 CLINICAL DATA:  Fever and weakness. EXAM: PORTABLE CHEST 1 VIEW COMPARISON:  October 09, 2019 FINDINGS: Multiple sternal wires are seen. Mild patchy infiltrate is seen within the right lung base. This represents a new finding when compared to the prior study. There is no evidence of a pleural effusion or pneumothorax. The heart size and mediastinal contours are within normal limits. Multilevel degenerative changes seen throughout the thoracic spine. IMPRESSION: 1. Interval development of mild patchy right basilar infiltrate since the prior study dated October 09, 2019. Electronically Signed   By: Virgina Norfolk M.D.   On: 10/13/2019  15:48   DG Chest Portable 1 View  Result Date: 10/09/2019 CLINICAL DATA:  [place]. EXAM: PORTABLE CHEST 1 VIEW COMPARISON:  August 31, 2014 FINDINGS: Multiple sternal wires are seen. This represents a new finding when compared to the prior study. There is no evidence of acute infiltrate, pleural effusion or pneumothorax. The heart size and mediastinal contours are within normal limits. Degenerative changes seen throughout the thoracic spine. IMPRESSION: 1. Interval median sternotomy since the prior study dated August 29, 2014. 2. No  acute or active cardiopulmonary disease. Electronically Signed   By: Virgina Norfolk M.D.   On: 10/09/2019 23:26    DISCHARGE EXAMINATION: Vitals:   10/19/19 2031 10/20/19 0406 10/20/19 0408 10/20/19 0830  BP: (!) 122/92 110/77  104/82  Pulse: 88  88 94  Resp: _0 Temp: (!) 97.4 F (36.3 C)  97.7 F (36.5 C) 98.2 F (36.8 C)  TempSrc: Oral  Oral Oral  SpO2: 97%  91% 93%  Weight:      Height:       General appearance: Awake alert.  In no distress Resp: Clear to auscultation bilaterally.  Normal effort Cardio: S1-S2 is normal regular.  No S3-S4.  No rubs murmurs or bruit GI: Abdomen is soft.  Nontender nondistended.  Bowel sounds are present normal.  No masses organomegaly    DISPOSITION: Home  Discharge Instructions    Call MD for:  difficulty breathing, headache or visual disturbances   Complete by: As directed    Call MD for:  extreme fatigue   Complete by: As directed    Call MD for:  persistant dizziness or light-headedness   Complete by: As directed    Call MD for:  persistant nausea and vomiting   Complete by: As directed    Call MD for:  severe uncontrolled pain   Complete by: As directed    Call MD for:  temperature >100.4   Complete by: As directed    Diet Carb Modified   Complete by: As directed    Discharge instructions   Complete by: As directed    Please follow-up with outpatient providers for further management of diabetes.  COVID 19 INSTRUCTIONS  - You are felt to be stable enough to no longer require inpatient monitoring, testing, and treatment, though you will need to follow the recommendations below: - Based on the CDC's non-test criteria for ending self-isolation: You may not return to work/leave the home until at least 21 days since symptom onset AND 3 days without a fever (without taking tylenol, ibuprofen, etc.) AND have improvement in respiratory symptoms. - Do not take NSAID medications (including, but not limited to, ibuprofen, advil,  motrin, naproxen, aleve, goody's powder, etc.) - Follow up with your doctor in the next week via telehealth or seek medical attention right away if your symptoms get WORSE.  - Consider donating plasma after you have recovered (either 14 days after a negative test or 28 days after symptoms have completely resolved) because your antibodies to this virus may be helpful to give to others with life-threatening infections. Please go to the website www.[email or web address] if you would like to consider volunteering for plasma donation.    Directions for you at home:  Wear a facemask You should wear a facemask that covers your nose and mouth when you are in the same room with other people and when you visit a healthcare provider. People who live with or visit you should also wear a facemask while they  are in the same room with you.  Separate yourself from other people in your home As much as possible, you should stay in a different room from other people in your home. Also, you should use a separate bathroom, if available.  Avoid sharing household items You should not share dishes, drinking glasses, cups, eating utensils, towels, bedding, or other items with other people in your home. After using these items, you should wash them thoroughly with soap and water.  Cover your coughs and sneezes Cover your mouth and nose with a tissue when you cough or sneeze, or you can cough or sneeze into your sleeve. Throw used tissues in a lined trash can, and immediately wash your hands with soap and water for at least 20 seconds or use an alcohol-based hand rub.  Wash your [facility] your hands often and thoroughly with soap and water for at least 20 seconds. You can use an alcohol-based hand sanitizer if soap and water are not available and if your hands are not visibly dirty. Avoid touching your eyes, nose, and mouth with unwashed hands.  Directions for those who live with, or provide care at home for you:  Limit  the number of people who have contact with the patient If possible, have only one caregiver for the patient. Other household members should stay in another home or place of residence. If this is not possible, they should stay in another room, or be separated from the patient as much as possible. Use a separate bathroom, if available. Restrict visitors who do not have an essential need to be in the home.  Ensure good ventilation Make sure that shared spaces in the home have good air flow, such as from an air conditioner or an opened window, weather permitting.  Wash your hands often Wash your hands often and thoroughly with soap and water for at least 20 seconds. You can use an alcohol based hand sanitizer if soap and water are not available and if your hands are not visibly dirty. Avoid touching your eyes, nose, and mouth with unwashed hands. Use disposable paper towels to dry your hands. If not available, use dedicated cloth towels and replace them when they become wet.  Wear a facemask and gloves Wear a disposable facemask at all times in the room and gloves when you touch or have contact with the patient's blood, body fluids, and/or secretions or excretions, such as sweat, saliva, sputum, nasal mucus, vomit, urine, or feces.  Ensure the mask fits over your nose and mouth tightly, and do not touch it during use. Throw out disposable facemasks and gloves after using them. Do not reuse. Wash your hands immediately after removing your facemask and gloves. If your personal clothing becomes contaminated, carefully remove clothing and launder. Wash your hands after handling contaminated clothing. Place all used disposable facemasks, gloves, and other waste in a lined container before disposing them with other household waste. Remove gloves and wash your hands immediately after handling these items.  Do not share dishes, glasses, or other household items with the patient Avoid sharing household  items. You should not share dishes, drinking glasses, cups, eating utensils, towels, bedding, or other items with a patient who is confirmed to have, or being evaluated for, COVID-19 infection. After the person uses these items, you should wash them thoroughly with soap and water.  Wash laundry thoroughly Immediately remove and wash clothes or bedding that have blood, body fluids, and/or secretions or excretions, such as sweat, saliva,  sputum, nasal mucus, vomit, urine, or feces, on them. Wear gloves when handling laundry from the patient. Read and follow directions on labels of laundry or clothing items and detergent. In general, wash and dry with the warmest temperatures recommended on the label.  Clean all areas the individual has used often Clean all touchable surfaces, such as counters, tabletops, doorknobs, bathroom fixtures, toilets, phones, keyboards, tablets, and bedside tables, every day. Also, clean any surfaces that may have blood, body fluids, and/or secretions or excretions on them. Wear gloves when cleaning surfaces the patient has come in contact with. Use a diluted bleach solution (e.g., dilute bleach with 1 part bleach and 10 parts water) or a household disinfectant with a label that says EPA-registered for coronaviruses. To make a bleach solution at home, add 1 tablespoon of bleach to 1 quart (4 cups) of water. For a larger supply, add  cup of bleach to 1 gallon (16 cups) of water. Read labels of cleaning products and follow recommendations provided on product labels. Labels contain instructions for safe and effective use of the cleaning product including precautions you should take when applying the product, such as wearing gloves or eye protection and making sure you have good ventilation during use of the product. Remove gloves and wash hands immediately after cleaning.  Monitor yourself for signs and symptoms of illness Caregivers and household members are considered close  contacts, should monitor their health, and will be asked to limit movement outside of the home to the extent possible. Follow the monitoring steps for close contacts listed on the symptom monitoring form.   If you have additional questions, contact your local health department or call the epidemiologist on call at [phone] (available 24/7). This guidance is subject to change. For the most up-to-date guidance from [facility], please refer to their website: YouBlogs.pl   You were cared for by a hospitalist during your hospital stay. If you have any questions about your discharge medications or the care you received while you were in the hospital after you are discharged, you can call the unit and asked to speak with the hospitalist on call if the hospitalist that took care of you is not available. Once you are discharged, your primary care physician will handle any further medical issues. Please note that NO REFILLS for any discharge medications will be authorized once you are discharged, as it is imperative that you return to your primary care physician (or establish a relationship with a primary care physician if you do not have one) for your aftercare needs so that they can reassess your need for medications and monitor your lab values. If you do not have a primary care physician, you can call [phone] for a physician referral.   Increase activity slowly   Complete by: As directed        Allergies as of 10/20/2019      Reactions   Lactose Intolerance (gi)       Medication List    STOP taking these medications   insulin glargine 100 UNIT/ML injection Commonly known as: Lantus   Insulin Syringe-Needle U-100 31G X 15/64" 1 ML Misc Commonly known as: BD Insulin Syringe Ultrafine   lisinopril 20 MG tablet Commonly known as: ZESTRIL   metoprolol tartrate 12.5 mg Tabs tablet Commonly known as: LOPRESSOR   NEEDLE (DISP) 25 G 25G  X 1" Misc   nicotine 14 mg/24hr patch Commonly known as: NICODERM CQ - dosed in mg/24 hours   senna-docusate 8.6-50 MG tablet  Commonly known as: Senokot-S   TRUEplus Lancets 28G Misc     TAKE these medications   aspirin EC 81 MG tablet Take 81 mg by mouth daily.   atorvastatin 80 MG tablet Commonly known as: LIPITOR Take 1 tablet (80 mg total) by mouth daily.   clopidogrel 75 MG tablet Commonly known as: PLAVIX Take 1 tablet (75 mg total) by mouth daily with breakfast.   dexamethasone 2 MG tablet Commonly known as: DECADRON Take 2 tablets once daily for 3 days, then 1 tablet once daily for 3 days, then STOP.   Insulin Pen Needle 30G X 8 MM Misc Commonly known as: NOVOFINE Inject 10 each into the skin as needed.   metFORMIN 500 MG 24 hr tablet Commonly known as: GLUCOPHAGE-XR Take 1 tablet (500 mg total) by mouth daily with breakfast.   NovoLIN 70/30 FlexPen Relion (70-30) 100 UNIT/ML PEN Generic drug: Insulin Isophane & Regular Human Inject 18 Units into the skin 2 (two) times daily.   omega-3 acid ethyl esters 1 g capsule Commonly known as: LOVAZA Take 2 capsules (2 g total) by mouth 2 (two) times daily.   pantoprazole 40 MG tablet Commonly known as: PROTONIX Take 1 tablet (40 mg total) by mouth 2 (two) times daily.   sodium bicarbonate 650 MG tablet Take 1 tablet (650 mg total) by mouth 2 (two) times daily for 10 days.   True Metrix Blood Glucose Test test strip Generic drug: glucose blood Check blood sugar fasting and before meals and again if pt feels bad (symptoms of hypo). Check sugar three times a day   True Metrix Meter w/Device Kit Use as directed          TOTAL DISCHARGE TIME: 35 minutes  [employer] on [email or web address].[email or web address]  10/20/2019, 1:21 PM

## 2019-12-04 ENCOUNTER — Other Ambulatory Visit: Payer: Self-pay

## 2020-01-26 ENCOUNTER — Encounter (HOSPITAL_COMMUNITY): Payer: Self-pay

## 2020-01-26 ENCOUNTER — Emergency Department (HOSPITAL_COMMUNITY)
Admission: EM | Admit: 2020-01-26 | Discharge: 2020-01-26 | Disposition: A | Discharge status: Home / Self Care | Payer: Medicaid Other | Attending: Emergency Medicine | Admitting: Emergency Medicine

## 2020-01-26 ENCOUNTER — Other Ambulatory Visit: Payer: Self-pay

## 2020-01-26 DIAGNOSIS — Z5321 Procedure and treatment not carried out due to patient leaving prior to being seen by health care provider: Secondary | ICD-10-CM | POA: Insufficient documentation

## 2020-01-26 DIAGNOSIS — R1110 Vomiting, unspecified: Secondary | ICD-10-CM | POA: Insufficient documentation

## 2020-01-26 MED ORDER — SODIUM CHLORIDE 0.9% FLUSH: 3.0000 mL | Freq: Once | INTRAVENOUS | Status: DC

## 2020-01-26 NOTE — ED Triage Notes (Signed)
Patient c/o emesis since last night. Patient states he is lactose intolerant and took the pills for that before eating pizza last night.  Patient states he is not nauseated at this time.

## 2020-02-22 ENCOUNTER — Other Ambulatory Visit: Payer: Self-pay

## 2020-02-22 ENCOUNTER — Encounter (HOSPITAL_COMMUNITY): Payer: Self-pay

## 2020-02-22 ENCOUNTER — Emergency Department (HOSPITAL_COMMUNITY)
Admission: EM | Admit: 2020-02-22 | Discharge: 2020-02-22 | Discharge status: Against Medical Advice | Payer: Medicaid Other | Attending: Emergency Medicine | Admitting: Emergency Medicine

## 2020-02-22 DIAGNOSIS — Z5321 Procedure and treatment not carried out due to patient leaving prior to being seen by health care provider: Secondary | ICD-10-CM | POA: Insufficient documentation

## 2020-02-22 DIAGNOSIS — E119 Type 2 diabetes mellitus without complications: Secondary | ICD-10-CM | POA: Insufficient documentation

## 2020-02-22 DIAGNOSIS — L989 Disorder of the skin and subcutaneous tissue, unspecified: Secondary | ICD-10-CM | POA: Insufficient documentation

## 2020-02-22 NOTE — ED Triage Notes (Signed)
Patient arrived stating he is a diabetic and has some redness and a bump in his groin that he is worried will become infected. Declines any pain, only itching.

## 2020-03-21 ENCOUNTER — Emergency Department (HOSPITAL_COMMUNITY)
Admission: EM | Admit: 2020-03-21 | Discharge: 2020-03-21 | Disposition: A | Discharge status: Home / Self Care | Payer: Medicaid Other | Attending: Emergency Medicine | Admitting: Emergency Medicine

## 2020-03-21 ENCOUNTER — Other Ambulatory Visit: Payer: Self-pay

## 2020-03-21 ENCOUNTER — Encounter (HOSPITAL_COMMUNITY): Payer: Self-pay | Admitting: Emergency Medicine

## 2020-03-21 ENCOUNTER — Emergency Department (HOSPITAL_COMMUNITY): Payer: Medicaid Other

## 2020-03-21 DIAGNOSIS — Z5321 Procedure and treatment not carried out due to patient leaving prior to being seen by health care provider: Secondary | ICD-10-CM | POA: Insufficient documentation

## 2020-03-21 DIAGNOSIS — R0789 Other chest pain: Secondary | ICD-10-CM | POA: Insufficient documentation

## 2020-03-21 LAB — CBC
HCT: 36.2 % — ABNORMAL LOW (ref 39.0–52.0)
Hemoglobin: 11.8 g/dL — ABNORMAL LOW (ref 13.0–17.0)
MCH: 29.4 pg (ref 26.0–34.0)
MCHC: 32.6 g/dL (ref 30.0–36.0)
MCV: 90 fL (ref 80.0–100.0)
Platelets: 197 10*3/uL (ref 150–400)
RBC: 4.02 MIL/uL — ABNORMAL LOW (ref 4.22–5.81)
RDW: 12.5 % (ref 11.5–15.5)
WBC: 10 10*3/uL (ref 4.0–10.5)
nRBC: 0 % (ref 0.0–0.2)

## 2020-03-21 LAB — BASIC METABOLIC PANEL
Anion gap: 11 (ref 5–15)
BUN: 27 mg/dL — ABNORMAL HIGH (ref 6–20)
CO2: 20 mmol/L — ABNORMAL LOW (ref 22–32)
Calcium: 8.9 mg/dL (ref 8.9–10.3)
Chloride: 104 mmol/L (ref 98–111)
Creatinine, Ser: 2.4 mg/dL — ABNORMAL HIGH (ref 0.61–1.24)
GFR calc Af Amer: 36 mL/min — ABNORMAL LOW (ref >60)
GFR calc non Af Amer: 31 mL/min — ABNORMAL LOW (ref >60)
Glucose, Bld: 421 mg/dL — ABNORMAL HIGH (ref 70–99)
Potassium: 3.9 mmol/L (ref 3.5–5.1)
Sodium: 135 mmol/L (ref 135–145)

## 2020-03-21 LAB — TROPONIN I (HIGH SENSITIVITY): Troponin I (High Sensitivity): 14 ng/L (ref <18)

## 2020-03-21 MED ORDER — SODIUM CHLORIDE 0.9% FLUSH: 3.0000 mL | Freq: Once | INTRAVENOUS | Status: DC

## 2020-03-21 NOTE — ED Triage Notes (Signed)
Pt c/o left cp for a week getting worse tonight, denies SOB, nausea, vomiting or dizziness.

## 2020-03-21 NOTE — ED Notes (Signed)
Pt stated he was going to [place].

## 2020-09-23 ENCOUNTER — Encounter (HOSPITAL_COMMUNITY): Payer: Self-pay | Admitting: Emergency Medicine

## 2020-09-23 ENCOUNTER — Emergency Department (HOSPITAL_COMMUNITY)
Admission: EM | Admit: 2020-09-23 | Discharge: 2020-09-23 | Disposition: A | Discharge status: Home / Self Care | Payer: Medicaid Other | Attending: Emergency Medicine | Admitting: Emergency Medicine

## 2020-09-23 ENCOUNTER — Other Ambulatory Visit: Payer: Self-pay

## 2020-09-23 DIAGNOSIS — S91209A Unspecified open wound of unspecified toe(s) with damage to nail, initial encounter: Secondary | ICD-10-CM

## 2020-09-23 DIAGNOSIS — Z951 Presence of aortocoronary bypass graft: Secondary | ICD-10-CM | POA: Insufficient documentation

## 2020-09-23 DIAGNOSIS — I10 Essential (primary) hypertension: Secondary | ICD-10-CM

## 2020-09-23 DIAGNOSIS — E1140 Type 2 diabetes mellitus with diabetic neuropathy, unspecified: Secondary | ICD-10-CM | POA: Insufficient documentation

## 2020-09-23 DIAGNOSIS — I129 Hypertensive chronic kidney disease with stage 1 through stage 4 chronic kidney disease, or unspecified chronic kidney disease: Secondary | ICD-10-CM | POA: Insufficient documentation

## 2020-09-23 DIAGNOSIS — Z7902 Long term (current) use of antithrombotics/antiplatelets: Secondary | ICD-10-CM | POA: Insufficient documentation

## 2020-09-23 DIAGNOSIS — Z7982 Long term (current) use of aspirin: Secondary | ICD-10-CM | POA: Insufficient documentation

## 2020-09-23 DIAGNOSIS — Z7984 Long term (current) use of oral hypoglycemic drugs: Secondary | ICD-10-CM | POA: Insufficient documentation

## 2020-09-23 DIAGNOSIS — N189 Chronic kidney disease, unspecified: Secondary | ICD-10-CM | POA: Insufficient documentation

## 2020-09-23 DIAGNOSIS — X58XXXA Exposure to other specified factors, initial encounter: Secondary | ICD-10-CM | POA: Insufficient documentation

## 2020-09-23 DIAGNOSIS — S91205A Unspecified open wound of left lesser toe(s) with damage to nail, initial encounter: Secondary | ICD-10-CM | POA: Insufficient documentation

## 2020-09-23 DIAGNOSIS — Z8616 Personal history of COVID-19: Secondary | ICD-10-CM | POA: Insufficient documentation

## 2020-09-23 DIAGNOSIS — E1122 Type 2 diabetes mellitus with diabetic chronic kidney disease: Secondary | ICD-10-CM | POA: Insufficient documentation

## 2020-09-23 DIAGNOSIS — I25119 Atherosclerotic heart disease of native coronary artery with unspecified angina pectoris: Secondary | ICD-10-CM | POA: Insufficient documentation

## 2020-09-23 DIAGNOSIS — Z794 Long term (current) use of insulin: Secondary | ICD-10-CM | POA: Insufficient documentation

## 2020-09-23 DIAGNOSIS — F17210 Nicotine dependence, cigarettes, uncomplicated: Secondary | ICD-10-CM | POA: Insufficient documentation

## 2020-09-23 NOTE — ED Triage Notes (Signed)
Patient here from home reporting that toe nail is about to fall off of the right 4th toe. States that he is a diabetic. No bleeding noted. Concerned.

## 2020-09-23 NOTE — Discharge Instructions (Signed)
Your toenail was removed by me.  Your blood pressure is elevated today please have it rechecked by your primary care provider and likely needing blood pressure medication readjustment if it continues to be high.

## 2020-09-23 NOTE — ED Provider Notes (Signed)
[facility] DEPT Provider Note   CSN: [id number] Arrival date & time: 09/23/20  2138     History Chief Complaint  Patient presents with  . Nail Problem    Howard Mcmahon is a 50 y.o. male.  The history is provided by the patient and medical records. No language interpreter was used.     50 year old male with significant history of diabetes, hypertension, peripheral disease presenting complaining of please remove the loose toenail.  Patient states he has diabetes and having peripheral neuropathy therefore he cannot feel much sensation in his feet.  Tonight he noticed that one of his nail is about to fall off and giving his condition he wants to make sure he can be cared for appropriately.  He denies any significant pain no fever and denies recalling any injury.  No other complaint.  Past Medical History:  Diagnosis Date  . Chronic kidney disease   . Diabetes mellitus without complication ([place])   . Hypertension   . Macular infarction    2012  . MI (myocardial infarction) ([place])   . Peripheral vascular disease (H[facility])    Neuropathy  . Pneumonia     Patient Active Problem List   Diagnosis Date Noted  . COVID-19 virus infection 10/13/2019  . AKI (acute kidney injury) ([place]) 10/13/2019  . Generalized weakness 10/13/2019  . Left sided numbness   . Fever   . HCAP (healthcare-associated pneumonia)   . Blood poisoning   . Diabetic ketoacidosis without coma associated with diabetes mellitus due to underlying condition ([place])   . SVT (supraventricular tachycardia) ([place])   . HHNC (hyperglycemic hyperosmolar nonketotic coma) ([place])   . Essential hypertension   . Pain in the chest   . Coronary artery disease with unspecified angina pectoris   . HLD (hyperlipidemia)   . Leukocytosis   . Numbness and tingling in left hand   . Noncompliance with medication regimen   . Chest pain 08/26/2014  . Paresthesia of left arm 08/26/2014  . Hyperglycemia due to type 2  diabetes mellitus ([place]) 08/26/2014  . DM (diabetes mellitus) ([place]) 01/25/2014  . DKA (diabetic ketoacidoses) 01/09/2014  . Type II or unspecified type diabetes mellitus with unspecified complication, uncontrolled 01/09/2014  . Essential hypertension, benign 01/09/2014  . CAD (coronary artery disease) 01/09/2014  . Fever presenting with conditions classified elsewhere 01/09/2014  . Leukocytosis, unspecified 01/09/2014  . Elevated lactic acid level 01/09/2014    Past Surgical History:  Procedure Laterality Date  . CORONARY ANGIOPLASTY WITH STENT PLACEMENT    . CORONARY ARTERY BYPASS GRAFT    . HIP ARTHROPLASTY Right        Family History  Problem Relation Age of Onset  . Stroke Mother     Social History   Tobacco Use  . Smoking status: Current Every Day Smoker    Packs/day: 2.00    Years: 30.00    Pack years: 60.00    Types: Cigarettes  . Smokeless tobacco: Current User  Substance Use Topics  . Alcohol use: No  . Drug use: No    Home Medications Prior to Admission medications   Medication Sig Start Date End Date Taking? Authorizing Provider  aspirin EC 81 MG tablet Take 81 mg by mouth daily.    [provider]  atorvastatin (LIPITOR) 80 MG tablet Take 1 tablet (80 mg total) by mouth daily. 10/20/19 11/19/19  Bonnielee Haff, MD  Blood Glucose Monitoring Suppl (TRUE METRIX METER) w/Device KIT Use as directed 01/02/19  Kerin Perna, NP  clopidogrel (PLAVIX) 75 MG tablet Take 1 tablet (75 mg total) by mouth daily with breakfast. 10/20/19   Bonnielee Haff, MD  dexamethasone (DECADRON) 2 MG tablet Take 2 tablets once daily for 3 days, then 1 tablet once daily for 3 days, then STOP. 10/20/19   Bonnielee Haff, MD  glucose blood (TRUE METRIX BLOOD GLUCOSE TEST) test strip Check blood sugar fasting and before meals and again if pt feels bad (symptoms of hypo). Check sugar three times a day 10/10/19   Charlann Lange, PA-C  Insulin Isophane & Regular Human (NOVOLIN 70/30  FLEXPEN RELION) (70-30) 100 UNIT/ML PEN Inject 18 Units into the skin 2 (two) times daily. 10/20/19   Bonnielee Haff, MD  Insulin Pen Needle (NOVOFINE) 30G X 8 MM MISC Inject 10 each into the skin as needed. 10/20/19   Bonnielee Haff, MD  metFORMIN (GLUCOPHAGE-XR) 500 MG 24 hr tablet Take 1 tablet (500 mg total) by mouth daily with breakfast. 10/20/19   Bonnielee Haff, MD  omega-3 acid ethyl esters (LOVAZA) 1 g capsule Take 2 capsules (2 g total) by mouth 2 (two) times daily. 01/05/19   Kerin Perna, NP  pantoprazole (PROTONIX) 40 MG tablet Take 1 tablet (40 mg total) by mouth 2 (two) times daily. 10/20/19   Bonnielee Haff, MD    Allergies    Lactose intolerance (gi)  Review of Systems   Review of Systems  Constitutional: Negative for fever.  Skin: Negative for wound.  Neurological: Positive for numbness.    Physical Exam Updated Vital Signs BP (!) 198/112 (BP Location: Right Arm) Comment: RN aware  Pulse 88   Temp 98.5 F (36.9 C) (Oral)   Resp 18   SpO2 98%   Physical Exam Vitals and nursing note reviewed.  Constitutional:      General: He is not in acute distress.    Appearance: He is well-developed and well-nourished.  HENT:     Head: Atraumatic.  Eyes:     Conjunctiva/sclera: Conjunctivae normal.  Musculoskeletal:     Cervical back: Neck supple.  Skin:    Findings: No rash.     Comments: Left foot: Toenail on the fourth toe is nearly completely avulsed without any significant signs of trauma and no bleeding or signs of infection.  Neurological:     Mental Status: He is alert.  Psychiatric:        Mood and Affect: Mood and affect and mood normal.     ED Results / Procedures / Treatments   Labs (all labs ordered are listed, but only abnormal results are displayed) Labs Reviewed - No data to display  EKG None  Radiology No results found.  Procedures Procedures (including critical care time)  Medications Ordered in ED Medications - No data to  display  ED Course  I have reviewed the triage vital signs and the nursing notes.  Pertinent labs & imaging results that were available during my care of the patient were reviewed by me and considered in my medical decision making (see chart for details).    MDM Rules/Calculators/A&P                          BP (!) 198/112 (BP Location: Right Arm) Comment: RN aware  Pulse 88   Temp 98.5 F (36.9 C) (Oral)   Resp 18   SpO2 98%  The patient was noted to be hypertensive today in the emergency department. I have  spoken with the patient regarding hypertension and the need for improved management. I instructed the patient to followup with the Primary care doctor within 4 days to improve the management of the patient's hypertension. I also counseled the patient regarding the signs and symptoms which would require an emergent visit to an emergency department for hypertensive urgency and/or hypertensive emergency. The patient understood the need for improved hypertensive management.  Final Clinical Impression(s) / ED Diagnoses Final diagnoses:  Nail avulsion, toe, initial encounter  Asymptomatic hypertension    Rx / DC Orders ED Discharge Orders    None     10:12 PM Patient request for me to remove his nearly avulsed left fourth toenail.  I was able to manually remove the toenail without incurring any significant injury.  No signs of infection.  Patient was found to be hypertensive with a blood pressure of 198/112.  He is without any symptoms concerning for hypertensive emergency.  I encourage patient to follow-up with PCP and have his blood pressure rechecked and perhaps medication adjustment for better blood pressure control.  Patient otherwise stable for discharge.   Tran, [place]ie, PA-C 09/23/20 2216    Butler, Michael C, MD 09/24/20 1112  

## 2020-12-13 ENCOUNTER — Emergency Department (HOSPITAL_BASED_OUTPATIENT_CLINIC_OR_DEPARTMENT_OTHER)
Admission: EM | Admit: 2020-12-13 | Discharge: 2020-12-13 | Disposition: A | Discharge status: Home / Self Care | Payer: Medicaid Other | Attending: Emergency Medicine | Admitting: Emergency Medicine

## 2020-12-13 ENCOUNTER — Encounter (HOSPITAL_BASED_OUTPATIENT_CLINIC_OR_DEPARTMENT_OTHER): Payer: Self-pay

## 2020-12-13 ENCOUNTER — Other Ambulatory Visit: Payer: Self-pay

## 2020-12-13 DIAGNOSIS — Z7984 Long term (current) use of oral hypoglycemic drugs: Secondary | ICD-10-CM | POA: Insufficient documentation

## 2020-12-13 DIAGNOSIS — Z951 Presence of aortocoronary bypass graft: Secondary | ICD-10-CM | POA: Insufficient documentation

## 2020-12-13 DIAGNOSIS — Z7902 Long term (current) use of antithrombotics/antiplatelets: Secondary | ICD-10-CM | POA: Insufficient documentation

## 2020-12-13 DIAGNOSIS — Z955 Presence of coronary angioplasty implant and graft: Secondary | ICD-10-CM | POA: Insufficient documentation

## 2020-12-13 DIAGNOSIS — Z8616 Personal history of COVID-19: Secondary | ICD-10-CM | POA: Insufficient documentation

## 2020-12-13 DIAGNOSIS — E1122 Type 2 diabetes mellitus with diabetic chronic kidney disease: Secondary | ICD-10-CM | POA: Insufficient documentation

## 2020-12-13 DIAGNOSIS — Z794 Long term (current) use of insulin: Secondary | ICD-10-CM | POA: Insufficient documentation

## 2020-12-13 DIAGNOSIS — J029 Acute pharyngitis, unspecified: Secondary | ICD-10-CM | POA: Insufficient documentation

## 2020-12-13 DIAGNOSIS — Z96641 Presence of right artificial hip joint: Secondary | ICD-10-CM | POA: Insufficient documentation

## 2020-12-13 DIAGNOSIS — I25119 Atherosclerotic heart disease of native coronary artery with unspecified angina pectoris: Secondary | ICD-10-CM | POA: Insufficient documentation

## 2020-12-13 DIAGNOSIS — N189 Chronic kidney disease, unspecified: Secondary | ICD-10-CM | POA: Insufficient documentation

## 2020-12-13 DIAGNOSIS — I129 Hypertensive chronic kidney disease with stage 1 through stage 4 chronic kidney disease, or unspecified chronic kidney disease: Secondary | ICD-10-CM | POA: Insufficient documentation

## 2020-12-13 DIAGNOSIS — Z7982 Long term (current) use of aspirin: Secondary | ICD-10-CM | POA: Insufficient documentation

## 2020-12-13 LAB — GROUP A STREP BY PCR: Group A Strep by PCR: NOT DETECTED

## 2020-12-13 MED ORDER — ACETAMINOPHEN 500 MG PO TABS
1000.0000 mg | ORAL_TABLET | Freq: Four times a day (QID) | ORAL | 0 refills | Status: DC | PRN
Start: 2020-12-13 — End: 2021-07-14

## 2020-12-13 MED ORDER — BENZONATATE 100 MG PO CAPS
100.0000 mg | ORAL_CAPSULE | Freq: Once | ORAL | Status: AC
  Administered 2020-12-13: 100 mg via ORAL
  Filled 2020-12-13: qty 1

## 2020-12-13 MED ORDER — BENZONATATE 100 MG PO CAPS: 100.0000 mg | ORAL_CAPSULE | Freq: Three times a day (TID) | ORAL | 0 refills | Status: DC

## 2020-12-13 MED ORDER — ACETAMINOPHEN 500 MG PO TABS
1000.0000 mg | ORAL_TABLET | Freq: Once | ORAL | Status: AC
  Administered 2020-12-13: 1000 mg via ORAL
  Filled 2020-12-13: qty 2

## 2020-12-13 NOTE — Discharge Instructions (Addendum)
1.  Take Tylenol every 6 hours for pain as needed.  You may also use the Tessalon Perles for cough or sore throat.  Do not chew or suck on these.  Swallow whole. 2.  Schedule a repeat check with your doctor within the next 3 to 5 days. 3.  Return to the emergency department if you develop shortness of breath, difficulty breathing or swallowing or other concerning symptoms

## 2020-12-13 NOTE — ED Provider Notes (Signed)
[facility] EMERGENCY DEPARTMENT Provider Note   CSN: [id number] Arrival date & time: 12/13/20  1919     History Chief Complaint  Patient presents with  . Sore Throat    Howard Mcmahon is a 51 y.o. male.  HPI Patient reports has had a sore throat for about a week.  He reports is somewhat painful when he swallows.  The pain is generally diffuse in his throat.  He denies this more to one side than the other.  He has had some nasal congestion and drainage.  No fevers or chills.  No headache.  No cough or shortness of breath.  No chest pain.  He has not tried anything for pain.  He reports he does not take any medications unless told to do so by Dr.  Patient reports that his girlfriend is being seen here as well.  He reports he is not sure what all of her symptoms have been.  He reports they both felt bad all week    Past Medical History:  Diagnosis Date  . Chronic kidney disease   . Diabetes mellitus without complication ([place])   . Hypertension   . Macular infarction    2012  . MI (myocardial infarction) ([place])   . Peripheral vascular disease (H[facility])    Neuropathy  . Pneumonia     Patient Active Problem List   Diagnosis Date Noted  . COVID-19 virus infection 10/13/2019  . AKI (acute kidney injury) ([place]) 10/13/2019  . Generalized weakness 10/13/2019  . Left sided numbness   . Fever   . HCAP (healthcare-associated pneumonia)   . Blood poisoning   . Diabetic ketoacidosis without coma associated with diabetes mellitus due to underlying condition ([place])   . SVT (supraventricular tachycardia) ([place])   . HHNC (hyperglycemic hyperosmolar nonketotic coma) ([place])   . Essential hypertension   . Pain in the chest   . Coronary artery disease with unspecified angina pectoris   . HLD (hyperlipidemia)   . Leukocytosis   . Numbness and tingling in left hand   . Noncompliance with medication regimen   . Chest pain 08/26/2014  . Paresthesia of left arm 08/26/2014  . Hyperglycemia due  to type 2 diabetes mellitus ([place]) 08/26/2014  . DM (diabetes mellitus) ([place]) 01/25/2014  . DKA (diabetic ketoacidoses) 01/09/2014  . Type II or unspecified type diabetes mellitus with unspecified complication, uncontrolled 01/09/2014  . Essential hypertension, benign 01/09/2014  . CAD (coronary artery disease) 01/09/2014  . Fever presenting with conditions classified elsewhere 01/09/2014  . Leukocytosis, unspecified 01/09/2014  . Elevated lactic acid level 01/09/2014    Past Surgical History:  Procedure Laterality Date  . CORONARY ANGIOPLASTY WITH STENT PLACEMENT    . CORONARY ARTERY BYPASS GRAFT    . HIP ARTHROPLASTY Right        Family History  Problem Relation Age of Onset  . Stroke Mother     Social History   Tobacco Use  . Smoking status: Current Every Day Smoker    Packs/day: 2.00    Years: 30.00    Pack years: 60.00    Types: Cigarettes  . Smokeless tobacco: Current User  Substance Use Topics  . Alcohol use: No  . Drug use: No    Home Medications Prior to Admission medications   Medication Sig Start Date End Date Taking? Authorizing Provider  acetaminophen (TYLENOL) 500 MG tablet Take 2 tablets (1,000 mg total) by mouth every 6 (six) hours as needed. 12/13/20  Yes Charlesetta Shanks, MD  aspirin EC 81 MG tablet Take 81 mg by mouth daily.   Yes [provider]  benzonatate (TESSALON) 100 MG capsule Take 1 capsule (100 mg total) by mouth every 8 (eight) hours. 12/13/20  Yes Charlesetta Shanks, MD  clopidogrel (PLAVIX) 75 MG tablet Take 1 tablet (75 mg total) by mouth daily with breakfast. 10/20/19  Yes Bonnielee Haff, MD  Insulin Isophane & Regular Human (NOVOLIN 70/30 FLEXPEN RELION) (70-30) 100 UNIT/ML PEN Inject 18 Units into the skin 2 (two) times daily. 10/20/19  Yes Bonnielee Haff, MD  omega-3 acid ethyl esters (LOVAZA) 1 g capsule Take 2 capsules (2 g total) by mouth 2 (two) times daily. 01/05/19  Yes Kerin Perna, NP  pantoprazole (PROTONIX) 40 MG  tablet Take 1 tablet (40 mg total) by mouth 2 (two) times daily. 10/20/19  Yes Bonnielee Haff, MD  atorvastatin (LIPITOR) 80 MG tablet Take 1 tablet (80 mg total) by mouth daily. 10/20/19 11/19/19  Bonnielee Haff, MD  Blood Glucose Monitoring Suppl (TRUE METRIX METER) w/Device KIT Use as directed 01/02/19   Kerin Perna, NP  dexamethasone (DECADRON) 2 MG tablet Take 2 tablets once daily for 3 days, then 1 tablet once daily for 3 days, then STOP. 10/20/19   Bonnielee Haff, MD  glucose blood (TRUE METRIX BLOOD GLUCOSE TEST) test strip Check blood sugar fasting and before meals and again if pt feels bad (symptoms of hypo). Check sugar three times a day 10/10/19   Charlann Lange, PA-C  Insulin Pen Needle (NOVOFINE) 30G X 8 MM MISC Inject 10 each into the skin as needed. 10/20/19   Bonnielee Haff, MD  metFORMIN (GLUCOPHAGE-XR) 500 MG 24 hr tablet Take 1 tablet (500 mg total) by mouth daily with breakfast. 10/20/19   Bonnielee Haff, MD    Allergies    Lactose intolerance (gi)  Review of Systems   Review of Systems 10 systems reviewed and negative except as per HPI Physical Exam Updated Vital Signs BP (!) 142/132   Pulse 81   Temp 98.9 F (37.2 C) (Oral)   Resp 16   Ht $R'6\' 4"'Es$  (1.93 m)   Wt 123.4 kg   SpO2 100%   BMI 33.12 kg/m   Physical Exam Constitutional:      Comments: Alert and nontoxic.  Sitting at the edge of stretcher.  No respiratory distress  HENT:     Head: Normocephalic and atraumatic.     Right Ear: Tympanic membrane normal.     Left Ear: Tympanic membrane normal.     Nose:     Comments: Clear nasal drainage.    Mouth/Throat:     Comments: Mucous membranes are moist and pink.  Slight erythema of the tonsillar pillars.  No exudates on the tonsils.  No significant enlargement. Eyes:     Extraocular Movements: Extraocular movements intact.     Conjunctiva/sclera: Conjunctivae normal.     Pupils: Pupils are equal, round, and reactive to light.  Cardiovascular:     Rate and  Rhythm: Normal rate and regular rhythm.     Heart sounds: Normal heart sounds.  Pulmonary:     Effort: Pulmonary effort is normal.     Breath sounds: Normal breath sounds.  Abdominal:     General: There is no distension.     Palpations: Abdomen is soft.  Musculoskeletal:        General: Normal range of motion.  Skin:    General: Skin is warm and dry.  Neurological:  General: No focal deficit present.     Mental Status: He is oriented to person, place, and time.     Coordination: Coordination normal.  Psychiatric:        Mood and Affect: Mood normal.     ED Results / Procedures / Treatments   Labs (all labs ordered are listed, but only abnormal results are displayed) Labs Reviewed  GROUP A STREP BY PCR    EKG None  Radiology No results found.  Procedures Procedures   Medications Ordered in ED Medications  acetaminophen (TYLENOL) tablet 1,000 mg (1,000 mg Oral Given 12/13/20 2257)  benzonatate (TESSALON) capsule 100 mg (100 mg Oral Given 12/13/20 2257)    ED Course  I have reviewed the triage vital signs and the nursing notes.  Pertinent labs & imaging results that were available during my care of the patient were reviewed by me and considered in my medical decision making (see chart for details).    MDM Rules/Calculators/A&P                          Patient presents with sore throat and nasal congestion.  Rapid strep is negative.  Clinically the patient is well.  No signs of respiratory distress.  Findings consistent with a viral URI.  Patient's companion is also being seen in the emergency department.  Companions Covid and influenza testing is negative.  At this time lower suspicion for Covid or influenza.  Patient is getting pain relief with ibuprofen and Tessalon Perle.  He is in no distress.  Will discharge at this time with recommended follow-up. Final Clinical Impression(s) / ED Diagnoses Final diagnoses:  Pharyngitis, unspecified etiology    Rx / DC  Orders ED Discharge Orders         Ordered    acetaminophen (TYLENOL) 500 MG tablet  Every 6 hours PRN        12/13/20 2318    benzonatate (TESSALON) 100 MG capsule  Every 8 hours        12/13/20 2318           Charlesetta Shanks, MD 12/13/20 2323

## 2020-12-13 NOTE — ED Triage Notes (Signed)
Sore throat for a week.

## 2021-01-06 ENCOUNTER — Encounter (HOSPITAL_COMMUNITY): Payer: Self-pay

## 2021-01-06 ENCOUNTER — Emergency Department (HOSPITAL_COMMUNITY)
Admission: EM | Admit: 2021-01-06 | Discharge: 2021-01-07 | Disposition: A | Discharge status: Home / Self Care | Payer: Medicaid Other | Attending: Emergency Medicine | Admitting: Emergency Medicine

## 2021-01-06 ENCOUNTER — Emergency Department (HOSPITAL_COMMUNITY): Payer: Medicaid Other

## 2021-01-06 ENCOUNTER — Other Ambulatory Visit: Payer: Self-pay

## 2021-01-06 DIAGNOSIS — I129 Hypertensive chronic kidney disease with stage 1 through stage 4 chronic kidney disease, or unspecified chronic kidney disease: Secondary | ICD-10-CM | POA: Insufficient documentation

## 2021-01-06 DIAGNOSIS — E1122 Type 2 diabetes mellitus with diabetic chronic kidney disease: Secondary | ICD-10-CM | POA: Insufficient documentation

## 2021-01-06 DIAGNOSIS — Z96641 Presence of right artificial hip joint: Secondary | ICD-10-CM | POA: Insufficient documentation

## 2021-01-06 DIAGNOSIS — N189 Chronic kidney disease, unspecified: Secondary | ICD-10-CM | POA: Insufficient documentation

## 2021-01-06 DIAGNOSIS — E1165 Type 2 diabetes mellitus with hyperglycemia: Secondary | ICD-10-CM | POA: Insufficient documentation

## 2021-01-06 DIAGNOSIS — Z951 Presence of aortocoronary bypass graft: Secondary | ICD-10-CM | POA: Insufficient documentation

## 2021-01-06 DIAGNOSIS — E1110 Type 2 diabetes mellitus with ketoacidosis without coma: Secondary | ICD-10-CM | POA: Insufficient documentation

## 2021-01-06 DIAGNOSIS — R519 Headache, unspecified: Secondary | ICD-10-CM | POA: Insufficient documentation

## 2021-01-06 DIAGNOSIS — R072 Precordial pain: Secondary | ICD-10-CM | POA: Insufficient documentation

## 2021-01-06 DIAGNOSIS — Z7982 Long term (current) use of aspirin: Secondary | ICD-10-CM | POA: Insufficient documentation

## 2021-01-06 DIAGNOSIS — I25119 Atherosclerotic heart disease of native coronary artery with unspecified angina pectoris: Secondary | ICD-10-CM | POA: Insufficient documentation

## 2021-01-06 DIAGNOSIS — Z794 Long term (current) use of insulin: Secondary | ICD-10-CM | POA: Insufficient documentation

## 2021-01-06 DIAGNOSIS — Z7902 Long term (current) use of antithrombotics/antiplatelets: Secondary | ICD-10-CM | POA: Insufficient documentation

## 2021-01-06 DIAGNOSIS — F17210 Nicotine dependence, cigarettes, uncomplicated: Secondary | ICD-10-CM | POA: Insufficient documentation

## 2021-01-06 DIAGNOSIS — R739 Hyperglycemia, unspecified: Secondary | ICD-10-CM

## 2021-01-06 DIAGNOSIS — Z7984 Long term (current) use of oral hypoglycemic drugs: Secondary | ICD-10-CM | POA: Insufficient documentation

## 2021-01-06 DIAGNOSIS — Z8616 Personal history of COVID-19: Secondary | ICD-10-CM | POA: Insufficient documentation

## 2021-01-06 NOTE — ED Triage Notes (Signed)
Pt reports headache for 1 week. No hx of migraines.

## 2021-01-06 NOTE — ED Triage Notes (Signed)
Emergency Medicine Provider Triage Evaluation Note  Howard Mcmahon , a 50 y.o. male  was evaluated in triage. Patient with history of 3 MI, takes plavix. Pt complains of headache x 1 week and chest x 2 weeks. Chest pain is left sided and is intermittent. No head injury. Headache is left sided and constant.  Review of Systems  Positive: Headache, chest pain Negative: Shortness of breath  Physical Exam  BP (!) 182/96   Pulse 90   Temp 98.8 F (37.1 C) (Oral)   Resp 18   Ht '6\' 4"'$  (1.93 m)   Wt 122.5 kg   SpO2 97%   BMI 32.87 kg/m  Gen:   Awake, no distress   HEENT:  Atraumatic  Resp:  Normal effort  Cardiac:  Normal rate  Abd:   Nondistended, nontender MSK:   Moves extremities without difficulty Neuro:  Speech clear   Medical Decision Making  Medically screening exam initiated at 11:39 PM.  Appropriate orders placed.  Muhammad Pesqueira was informed that the remainder of the evaluation will be completed by another provider, this initial triage assessment does not replace that evaluation, and the importance of remaining in the ED until their evaluation is complete.  Clinical Impression  Headache and chest pain. Labs, EKG, and chest xray ordered.   Barrie Folk, PA-C 01/06/21 2345

## 2021-01-07 LAB — CBC WITH DIFFERENTIAL/PLATELET
Abs Immature Granulocytes: 0.03 10*3/uL (ref 0.00–0.07)
Basophils Absolute: 0 10*3/uL (ref 0.0–0.1)
Basophils Relative: 1 %
Eosinophils Absolute: 0.2 10*3/uL (ref 0.0–0.5)
Eosinophils Relative: 3 %
HCT: 37.5 % — ABNORMAL LOW (ref 39.0–52.0)
Hemoglobin: 12.3 g/dL — ABNORMAL LOW (ref 13.0–17.0)
Immature Granulocytes: 0 %
Lymphocytes Relative: 26 %
Lymphs Abs: 2.1 10*3/uL (ref 0.7–4.0)
MCH: 29.6 pg (ref 26.0–34.0)
MCHC: 32.8 g/dL (ref 30.0–36.0)
MCV: 90.1 fL (ref 80.0–100.0)
Monocytes Absolute: 0.6 10*3/uL (ref 0.1–1.0)
Monocytes Relative: 7 %
Neutro Abs: 5.2 10*3/uL (ref 1.7–7.7)
Neutrophils Relative %: 63 %
Platelets: 205 10*3/uL (ref 150–400)
RBC: 4.16 MIL/uL — ABNORMAL LOW (ref 4.22–5.81)
RDW: 12.1 % (ref 11.5–15.5)
WBC: 8.2 10*3/uL (ref 4.0–10.5)
nRBC: 0 % (ref 0.0–0.2)

## 2021-01-07 LAB — BASIC METABOLIC PANEL
Anion gap: 9 (ref 5–15)
Anion gap: 11 (ref 5–15)
BUN: 31 mg/dL — ABNORMAL HIGH (ref 6–20)
BUN: 35 mg/dL — ABNORMAL HIGH (ref 6–20)
CO2: 21 mmol/L — ABNORMAL LOW (ref 22–32)
CO2: 22 mmol/L (ref 22–32)
Calcium: 8.5 mg/dL — ABNORMAL LOW (ref 8.9–10.3)
Calcium: 8.7 mg/dL — ABNORMAL LOW (ref 8.9–10.3)
Chloride: 99 mmol/L (ref 98–111)
Chloride: 104 mmol/L (ref 98–111)
Creatinine, Ser: 3.09 mg/dL — ABNORMAL HIGH (ref 0.61–1.24)
Creatinine, Ser: 2.8 mg/dL — ABNORMAL HIGH (ref 0.61–1.24)
GFR, Estimated: 24 mL/min — ABNORMAL LOW (ref >60)
GFR, Estimated: 27 mL/min — ABNORMAL LOW (ref >60)
Glucose, Bld: 559 mg/dL (ref 70–99)
Glucose, Bld: 405 mg/dL — ABNORMAL HIGH (ref 70–99)
Potassium: 5.8 mmol/L — ABNORMAL HIGH (ref 3.5–5.1)
Potassium: 4.9 mmol/L (ref 3.5–5.1)
Sodium: 129 mmol/L — ABNORMAL LOW (ref 135–145)
Sodium: 137 mmol/L (ref 135–145)

## 2021-01-07 LAB — TROPONIN I (HIGH SENSITIVITY)
Troponin I (High Sensitivity): 13 ng/L (ref <18)
Troponin I (High Sensitivity): 13 ng/L (ref <18)

## 2021-01-07 MED ORDER — INSULIN ASPART 100 UNIT/ML IV SOLN
10.0000 [IU] | Freq: Once | INTRAVENOUS | Status: AC
Start: 2021-01-07 — End: 2021-01-07
  Administered 2021-01-07: 10 [IU] via INTRAVENOUS
  Filled 2021-01-07: qty 0.1

## 2021-01-07 MED ORDER — FUROSEMIDE 10 MG/ML IJ SOLN
40.0000 mg | Freq: Once | INTRAMUSCULAR | Status: AC
  Administered 2021-01-07: 40 mg via INTRAVENOUS
  Filled 2021-01-07: qty 4

## 2021-01-07 NOTE — ED Notes (Signed)
Critical Value Result Time: 0239 Test: Glucose Result: 559 Notified: Christy Gentles, MD Action: Awaiting orders

## 2021-01-07 NOTE — ED Provider Notes (Signed)
[facility] DEPT Provider Note   CSN: [id number] Arrival date & time: 01/06/21  2256     History Chief Complaint  Patient presents with  . Headache  . Chest Pain    Howard Mcmahon is a 50 y.o. male.  HPI  HPI: A 50 year old patient with a history of treated diabetes, hypertension and obesity presents for evaluation of chest pain. Initial onset of pain was more than 6 hours ago. The patient's chest pain is well-localized, is sharp and is not worse with exertion. The patient's chest pain is middle- or left-sided, is not described as heaviness/pressure/tightness and does not radiate to the arms/jaw/neck. The patient does not complain of nausea and denies diaphoresis. The patient has smoked in the past 90 days. The patient has no history of stroke, has no history of peripheral artery disease, has no relevant family history of coronary artery disease (first degree relative at less than age 44) and has no history of hypercholesterolemia.  Patient w/ history of CAD, hypertension, diabetes presents with chest pain.  Patient reports he has had left sided pinpoint sharp chest pain for 2 weeks.  He tells me that this chest pain has been constant for 2 weeks (which differs from the previous provider's notes) The pain does not radiate.  It is not pleuritic.  There is no tearing sensation to his back.  No recent trauma to his chest.  He reports mild shortness of breath.  No fevers or vomiting.  No diaphoresis.  He also reports recent mild headaches without any focal weakness or visual changes. Denies history of CVA/VTE. Past Medical History:  Diagnosis Date  . Chronic kidney disease   . Diabetes mellitus without complication ([place])   . Hypertension   . Macular infarction    2012  . MI (myocardial infarction) ([place])   . Peripheral vascular disease (HCC)    Neuropathy  . Pneumonia     Patient Active Problem List   Diagnosis Date Noted  . COVID-19 virus infection  10/13/2019  . AKI (acute kidney injury) ([place]) 10/13/2019  . Generalized weakness 10/13/2019  . Left sided numbness   . Fever   . HCAP (healthcare-associated pneumonia)   . Blood poisoning   . Diabetic ketoacidosis without coma associated with diabetes mellitus due to underlying condition ([place])   . SVT (supraventricular tachycardia) ([place])   . HHNC (hyperglycemic hyperosmolar nonketotic coma) ([place])   . Essential hypertension   . Pain in the chest   . Coronary artery disease with unspecified angina pectoris   . HLD (hyperlipidemia)   . Leukocytosis   . Numbness and tingling in left hand   . Noncompliance with medication regimen   . Chest pain 08/26/2014  . Paresthesia of left arm 08/26/2014  . Hyperglycemia due to type 2 diabetes mellitus ([place]) 08/26/2014  . DM (diabetes mellitus) ([place]) 01/25/2014  . DKA (diabetic ketoacidoses) 01/09/2014  . Type II or unspecified type diabetes mellitus with unspecified complication, uncontrolled 01/09/2014  . Essential hypertension, benign 01/09/2014  . CAD (coronary artery disease) 01/09/2014  . Fever presenting with conditions classified elsewhere 01/09/2014  . Leukocytosis, unspecified 01/09/2014  . Elevated lactic acid level 01/09/2014    Past Surgical History:  Procedure Laterality Date  . CORONARY ANGIOPLASTY WITH STENT PLACEMENT    . CORONARY ARTERY BYPASS GRAFT    . HIP ARTHROPLASTY Right        Family History  Problem Relation Age of Onset  . Stroke Mother     Social  History   Tobacco Use  . Smoking status: Current Every Day Smoker    Packs/day: 2.00    Years: 30.00    Pack years: 60.00    Types: Cigarettes  . Smokeless tobacco: Current User  Substance Use Topics  . Alcohol use: No  . Drug use: No    Home Medications Prior to Admission medications   Medication Sig Start Date End Date Taking? Authorizing Provider  acetaminophen (TYLENOL) 500 MG tablet Take 2 tablets (1,000 mg total) by mouth every 6 (six) hours as  needed. 12/13/20   Charlesetta Shanks, MD  aspirin EC 81 MG tablet Take 81 mg by mouth daily.    [provider]  atorvastatin (LIPITOR) 80 MG tablet Take 1 tablet (80 mg total) by mouth daily. 10/20/19 11/19/19  Bonnielee Haff, MD  benzonatate (TESSALON) 100 MG capsule Take 1 capsule (100 mg total) by mouth every 8 (eight) hours. 12/13/20   Charlesetta Shanks, MD  Blood Glucose Monitoring Suppl (TRUE METRIX METER) w/Device KIT Use as directed 01/02/19   Kerin Perna, NP  clopidogrel (PLAVIX) 75 MG tablet Take 1 tablet (75 mg total) by mouth daily with breakfast. 10/20/19   Bonnielee Haff, MD  dexamethasone (DECADRON) 2 MG tablet Take 2 tablets once daily for 3 days, then 1 tablet once daily for 3 days, then STOP. 10/20/19   Bonnielee Haff, MD  glucose blood (TRUE METRIX BLOOD GLUCOSE TEST) test strip Check blood sugar fasting and before meals and again if pt feels bad (symptoms of hypo). Check sugar three times a day 10/10/19   Charlann Lange, PA-C  Insulin Isophane & Regular Human (NOVOLIN 70/30 FLEXPEN RELION) (70-30) 100 UNIT/ML PEN Inject 18 Units into the skin 2 (two) times daily. 10/20/19   Bonnielee Haff, MD  Insulin Pen Needle (NOVOFINE) 30G X 8 MM MISC Inject 10 each into the skin as needed. 10/20/19   Bonnielee Haff, MD  metFORMIN (GLUCOPHAGE-XR) 500 MG 24 hr tablet Take 1 tablet (500 mg total) by mouth daily with breakfast. 10/20/19   Bonnielee Haff, MD  omega-3 acid ethyl esters (LOVAZA) 1 g capsule Take 2 capsules (2 g total) by mouth 2 (two) times daily. 01/05/19   Kerin Perna, NP  pantoprazole (PROTONIX) 40 MG tablet Take 1 tablet (40 mg total) by mouth 2 (two) times daily. 10/20/19   Bonnielee Haff, MD    Allergies    Lisinopril, Lactose intolerance (gi), and Other  Review of Systems   Review of Systems  Constitutional: Negative for fever.  Eyes: Negative for visual disturbance.  Respiratory: Positive for shortness of breath.   Cardiovascular: Positive for chest pain.   Gastrointestinal: Negative for vomiting.  Neurological: Positive for headaches. Negative for weakness.  All other systems reviewed and are negative.   Physical Exam Updated Vital Signs BP (!) 162/94   Pulse 87   Temp 98.8 F (37.1 C) (Oral)   Resp 17   Ht 1.93 m ($Remov'6\' 4"'BBTykA$ )   Wt 122.5 kg   SpO2 98%   BMI 32.87 kg/m   Physical Exam CONSTITUTIONAL: Well developed, chronically ill-appearing HEAD: Normocephalic/atraumatic EYES: EOMI/PERRL ENMT: Mucous membranes moist NECK: supple no meningeal signs SPINE/BACK:entire spine nontender CV: S1/S2 noted, no murmurs/rubs/gallops noted LUNGS: Lungs are clear to auscultation bilaterally, no apparent distress Chest-pinpoint left-sided chest pain to palpation ABDOMEN: soft, nontender, no rebound or guarding, bowel sounds noted throughout abdomen GU:no cva tenderness NEURO: Pt is awake/alert/appropriate, moves all extremitiesx4.  No facial droop.  No arm or leg  drift. EXTREMITIES: pulses normal/equalx4, full ROM SKIN: warm, color normal PSYCH: no abnormalities of mood noted, alert and oriented to situation  ED Results / Procedures / Treatments   Labs (all labs ordered are listed, but only abnormal results are displayed) Labs Reviewed  BASIC METABOLIC PANEL - Abnormal; Notable for the following components:      Result Value   Sodium 129 (*)    Potassium 5.8 (*)    CO2 21 (*)    Glucose, Bld 559 (*)    BUN 31 (*)    Creatinine, Ser 3.09 (*)    Calcium 8.5 (*)    GFR, Estimated 24 (*)    All other components within normal limits  CBC WITH DIFFERENTIAL/PLATELET - Abnormal; Notable for the following components:   RBC 4.16 (*)    Hemoglobin 12.3 (*)    HCT 37.5 (*)    All other components within normal limits  BASIC METABOLIC PANEL - Abnormal; Notable for the following components:   Glucose, Bld 405 (*)    BUN 35 (*)    Creatinine, Ser 2.80 (*)    Calcium 8.7 (*)    GFR, Estimated 27 (*)    All other components within normal  limits  TROPONIN I (HIGH SENSITIVITY)  TROPONIN I (HIGH SENSITIVITY)    EKG ED ECG REPORT   Date: 01/07/2021 0059  Rate: 85  Rhythm: normal sinus rhythm  QRS Axis: normal  Intervals: normal  ST/T Wave abnormalities: early repolarization  Conduction Disutrbances:none  Narrative Interpretation:   Old EKG Reviewed: unchanged  I have personally reviewed the EKG tracing and agree with the computerized printout as noted.  Radiology DG Chest 2 View  Result Date: 01/07/2021 CLINICAL DATA:  Headache and chest pain. EXAM: CHEST - 2 VIEW COMPARISON:  March 21, 2020 FINDINGS: Multiple sternal wires are seen. Chronic appearing increased interstitial lung markings are noted. There is no evidence of focal consolidation, pleural effusion or pneumothorax. The heart size and mediastinal contours are within normal limits. Degenerative changes seen throughout the thoracic spine. IMPRESSION: Chronic appearing interstitial changes without acute cardiopulmonary disease Electronically Signed   By: Virgina Norfolk M.D.   On: 01/07/2021 01:31    Procedures Procedures   Medications Ordered in ED Medications  insulin aspart (novoLOG) injection 10 Units (10 Units Intravenous Given 01/07/21 0333)  furosemide (LASIX) injection 40 mg (40 mg Intravenous Given 01/07/21 9373)    ED Course  I have reviewed the triage vital signs and the nursing notes.  Pertinent labs & imaging results that were available during my care of the patient were reviewed by me and considered in my medical decision making (see chart for details).    MDM Rules/Calculators/A&P HEAR Score: 4                        2:19 AM Patient presents with left-sided chest pain that has been constant for 2 weeks.  It is reproducible on exam.  Patient reports medication compliance but had difficulty following up with cardiology due to insurance issues. Patient reports this does not feel like previous anginal episodes Labs pending at this  time 3:33 AM Initial troponin unremarkable.  However patient does have hyperglycemia and mild hyperkalemia.  No acute EKG changes.  Patient will be given insulin and Lasix. 7:00 AM Patient has been sleeping comfortably without any distress.  Labs are improving.  Hyperkalemia is resolved.  Glucose is trending down.  No signs of DKA.  No new  chest pain complaints. Patient feels comfortable for discharge home.  Given that his chest pain was sharp in nature, no acute EKG changes and troponins are unremarkable, low suspicion for ACS at this time.  Final Clinical Impression(s) / ED Diagnoses Final diagnoses:  Precordial pain  Hyperglycemia    Rx / DC Orders ED Discharge Orders    None       Ripley Fraise, MD 01/07/21 [phone]

## 2021-01-07 NOTE — Discharge Instructions (Addendum)

## 2021-02-05 ENCOUNTER — Encounter (HOSPITAL_COMMUNITY): Payer: Self-pay

## 2021-02-05 ENCOUNTER — Emergency Department (HOSPITAL_COMMUNITY)
Admission: EM | Admit: 2021-02-05 | Discharge: 2021-02-05 | Disposition: A | Discharge status: Home / Self Care | Payer: Medicaid Other | Attending: Emergency Medicine | Admitting: Emergency Medicine

## 2021-02-05 ENCOUNTER — Other Ambulatory Visit: Payer: Self-pay

## 2021-02-05 DIAGNOSIS — I2510 Atherosclerotic heart disease of native coronary artery without angina pectoris: Secondary | ICD-10-CM | POA: Insufficient documentation

## 2021-02-05 DIAGNOSIS — N189 Chronic kidney disease, unspecified: Secondary | ICD-10-CM | POA: Insufficient documentation

## 2021-02-05 DIAGNOSIS — B351 Tinea unguium: Secondary | ICD-10-CM | POA: Insufficient documentation

## 2021-02-05 DIAGNOSIS — Z7984 Long term (current) use of oral hypoglycemic drugs: Secondary | ICD-10-CM | POA: Insufficient documentation

## 2021-02-05 DIAGNOSIS — Z7982 Long term (current) use of aspirin: Secondary | ICD-10-CM | POA: Insufficient documentation

## 2021-02-05 DIAGNOSIS — R109 Unspecified abdominal pain: Secondary | ICD-10-CM | POA: Insufficient documentation

## 2021-02-05 DIAGNOSIS — Z794 Long term (current) use of insulin: Secondary | ICD-10-CM | POA: Insufficient documentation

## 2021-02-05 DIAGNOSIS — E1122 Type 2 diabetes mellitus with diabetic chronic kidney disease: Secondary | ICD-10-CM | POA: Insufficient documentation

## 2021-02-05 DIAGNOSIS — I1310 Hypertensive heart and chronic kidney disease without heart failure, with stage 1 through stage 4 chronic kidney disease, or unspecified chronic kidney disease: Secondary | ICD-10-CM | POA: Insufficient documentation

## 2021-02-05 DIAGNOSIS — Z951 Presence of aortocoronary bypass graft: Secondary | ICD-10-CM | POA: Insufficient documentation

## 2021-02-05 DIAGNOSIS — Z96641 Presence of right artificial hip joint: Secondary | ICD-10-CM | POA: Insufficient documentation

## 2021-02-05 DIAGNOSIS — Z87891 Personal history of nicotine dependence: Secondary | ICD-10-CM | POA: Insufficient documentation

## 2021-02-05 DIAGNOSIS — Z8616 Personal history of COVID-19: Secondary | ICD-10-CM | POA: Insufficient documentation

## 2021-02-05 DIAGNOSIS — Z79899 Other long term (current) drug therapy: Secondary | ICD-10-CM | POA: Insufficient documentation

## 2021-02-05 MED ORDER — DOXYCYCLINE HYCLATE 100 MG PO CAPS: 100.0000 mg | ORAL_CAPSULE | Freq: Two times a day (BID) | ORAL | 0 refills | Status: AC

## 2021-02-05 NOTE — Discharge Instructions (Addendum)
I prescribed you an antibiotic to help prevent infection in the foot.  It is called doxycycline.  Please take this twice a day for the next 10 days.  Below is the contact information for a local podiatrist.  Please give them a call after the holiday weekend and schedule a follow-up appointment to have your feet reevaluated.  If you develop any new or worsening symptoms such as swelling, pain, redness, fevers, vomiting, please come back to the emergency department for reevaluation.  It was a pleasure to meet you.

## 2021-02-05 NOTE — ED Provider Notes (Signed)
[facility] DEPT Provider Note   CSN: [id number] Arrival date & time: 02/05/21  1109     History Chief Complaint  Patient presents with  . Abdominal Pain    Howard Mcmahon is a 50 y.o. male.  HPI Patient is a 50 year old male with history of hypertension, diabetes mellitus, CKD, who presents to the emergency department due to right toenail removal.  Patient states that about 3 days ago his right small toenail fell off.  Also states that his right great toenail is loose.  States that this happened to all the toenails of the left foot in the past.  States his insurance starts in about 2 days and due to this has not seen a podiatrist yet.  Denies any foot swelling, foot pain, fevers, chills, nausea, or vomiting.    Past Medical History:  Diagnosis Date  . Chronic kidney disease   . Diabetes mellitus without complication ([place])   . Hypertension   . Macular infarction    2012  . MI (myocardial infarction) ([place])   . Peripheral vascular disease (HCC)    Neuropathy  . Pneumonia     Patient Active Problem List   Diagnosis Date Noted  . COVID-19 virus infection 10/13/2019  . AKI (acute kidney injury) ([place]) 10/13/2019  . Generalized weakness 10/13/2019  . Left sided numbness   . Fever   . HCAP (healthcare-associated pneumonia)   . Blood poisoning   . Diabetic ketoacidosis without coma associated with diabetes mellitus due to underlying condition ([place])   . SVT (supraventricular tachycardia) ([place])   . HHNC (hyperglycemic hyperosmolar nonketotic coma) ([place])   . Essential hypertension   . Pain in the chest   . Coronary artery disease with unspecified angina pectoris   . HLD (hyperlipidemia)   . Leukocytosis   . Numbness and tingling in left hand   . Noncompliance with medication regimen   . Chest pain 08/26/2014  . Paresthesia of left arm 08/26/2014  . Hyperglycemia due to type 2 diabetes mellitus ([place]) 08/26/2014  . DM (diabetes mellitus) ([place])  01/25/2014  . DKA (diabetic ketoacidoses) 01/09/2014  . Type II or unspecified type diabetes mellitus with unspecified complication, uncontrolled 01/09/2014  . Essential hypertension, benign 01/09/2014  . CAD (coronary artery disease) 01/09/2014  . Fever presenting with conditions classified elsewhere 01/09/2014  . Leukocytosis, unspecified 01/09/2014  . Elevated lactic acid level 01/09/2014    Past Surgical History:  Procedure Laterality Date  . CARDIAC SURGERY    . CORONARY ANGIOPLASTY WITH STENT PLACEMENT    . CORONARY ARTERY BYPASS GRAFT    . HIP ARTHROPLASTY Right        Family History  Problem Relation Age of Onset  . Stroke Mother     Social History   Tobacco Use  . Smoking status: Former Smoker    Packs/day: 2.00    Years: 30.00    Pack years: 60.00    Types: Cigarettes  . Smokeless tobacco: Current User  Vaping Use  . Vaping Use: Never used  Substance Use Topics  . Alcohol use: No  . Drug use: No    Home Medications Prior to Admission medications   Medication Sig Start Date End Date Taking? Authorizing Provider  doxycycline (VIBRAMYCIN) 100 MG capsule Take 1 capsule (100 mg total) by mouth 2 (two) times daily for 10 days. 02/05/21 02/15/21 Yes Rayna Sexton, PA-C  acetaminophen (TYLENOL) 500 MG tablet Take 2 tablets (1,000 mg total) by mouth every 6 (six) hours as  needed. Patient taking differently: Take 1,000 mg by mouth every 6 (six) hours as needed for mild pain. 12/13/20   Charlesetta Shanks, MD  aspirin EC 81 MG tablet Take 81 mg by mouth daily.    [provider]  atorvastatin (LIPITOR) 80 MG tablet Take 1 tablet (80 mg total) by mouth daily. 10/20/19 11/19/19  Bonnielee Haff, MD  Blood Glucose Monitoring Suppl (TRUE METRIX METER) w/Device KIT Use as directed 01/02/19   Kerin Perna, NP  clopidogrel (PLAVIX) 75 MG tablet Take 1 tablet (75 mg total) by mouth daily with breakfast. 10/20/19   Bonnielee Haff, MD  glucose blood (TRUE METRIX BLOOD  GLUCOSE TEST) test strip Check blood sugar fasting and before meals and again if pt feels bad (symptoms of hypo). Check sugar three times a day 10/10/19   Charlann Lange, PA-C  Insulin Isophane & Regular Human (NOVOLIN 70/30 FLEXPEN RELION) (70-30) 100 UNIT/ML PEN Inject 18 Units into the skin 2 (two) times daily. Patient taking differently: Inject 36 Units into the skin 2 (two) times daily. 10/20/19   Bonnielee Haff, MD  Insulin Pen Needle (NOVOFINE) 30G X 8 MM MISC Inject 10 each into the skin as needed. 10/20/19   Bonnielee Haff, MD  metFORMIN (GLUCOPHAGE-XR) 500 MG 24 hr tablet Take 1 tablet (500 mg total) by mouth daily with breakfast. Patient not taking: No sig reported 10/20/19   Bonnielee Haff, MD  metoprolol tartrate (LOPRESSOR) 50 MG tablet Take 50 mg by mouth in the morning and at bedtime.    [provider]  omega-3 acid ethyl esters (LOVAZA) 1 g capsule Take 2 capsules (2 g total) by mouth 2 (two) times daily. 01/05/19   Kerin Perna, NP  pantoprazole (PROTONIX) 40 MG tablet Take 1 tablet (40 mg total) by mouth 2 (two) times daily. 10/20/19   Bonnielee Haff, MD    Allergies    Lisinopril, Lactose intolerance (gi), and Other  Review of Systems   Review of Systems  Constitutional: Negative for chills and fever.  Gastrointestinal: Negative for nausea and vomiting.  Musculoskeletal: Negative for arthralgias and myalgias.  Skin: Positive for wound. Negative for color change.   Physical Exam Updated Vital Signs BP (!) 171/113 (BP Location: Left Arm)   Pulse 93   Temp 97.9 F (36.6 C) (Oral)   Ht $R'6\' 4"'nI$  (1.93 m)   Wt 122.5 kg   SpO2 97%   BMI 32.87 kg/m   Physical Exam Vitals and nursing note reviewed.  Constitutional:      General: He is not in acute distress.    Appearance: He is well-developed.  HENT:     Head: Normocephalic and atraumatic.     Right Ear: External ear normal.     Left Ear: External ear normal.  Eyes:     General: No scleral icterus.        Right eye: No discharge.        Left eye: No discharge.     Conjunctiva/sclera: Conjunctivae normal.  Neck:     Trachea: No tracheal deviation.  Cardiovascular:     Rate and Rhythm: Normal rate.  Pulmonary:     Effort: Pulmonary effort is normal. No respiratory distress.     Breath sounds: No stridor.  Abdominal:     General: There is no distension.  Musculoskeletal:        General: No swelling or deformity.     Cervical back: Neck supple.  Skin:    General: Skin is warm  and dry.     Findings: No rash.     Comments: Diffuse onychomycosis noted in the toenails of the right foot.  Right fifth toe is a removed toenail.  Nailbed appears intact.  No erythema or swelling noted in the digit.  No tenderness.  Right great toenail has a small amount of laxity with manipulation.  Appears to be intact.  No pain or swelling noted in the great toe.  No pain or swelling noted in the right foot.  2+ DP pulses.  Neurological:     Mental Status: He is alert.     Cranial Nerves: Cranial nerve deficit: no gross deficits.    ED Results / Procedures / Treatments   Labs (all labs ordered are listed, but only abnormal results are displayed) Labs Reviewed - No data to display  EKG None  Radiology No results found.  Procedures Procedures   Medications Ordered in ED Medications - No data to display  ED Course  I have reviewed the triage vital signs and the nursing notes.  Pertinent labs & imaging results that were available during my care of the patient were reviewed by me and considered in my medical decision making (see chart for details).    MDM Rules/Calculators/A&P                          Patient is a 50 year old male with a history of diabetes mellitus who presents to the emergency department due to issues with his toenails on the right foot.  Patient has diffuse onychomycosis along the toes of the right foot.  Right great toenail is loose but the right fifth toenail has completely  fallen off.  No swelling, erythema, tenderness, or increased warmth in the foot or toes.  Exam not concerning for infection at this time.  Given his medical history will discharge on a course of doxycycline to prevent infection in the foot.  Patient states he has new insurance starting in 2 days.  I gave him a new referral to a local podiatrist.  Recommended that he follow-up with podiatry next week to schedule a follow-up appointment regarding his feet.  Feel the patient is stable for discharge at this time and he is agreeable.  Given strict return precautions.  His questions were answered and he was amicable at the time of discharge.  Final Clinical Impression(s) / ED Diagnoses Final diagnoses:  Onychomycosis   Rx / DC Orders ED Discharge Orders         Ordered    doxycycline (VIBRAMYCIN) 100 MG capsule  2 times daily        02/05/21 1216           Rayna Sexton, PA-C 02/05/21 1258    Malvin Johns, MD 02/05/21 1406

## 2021-02-05 NOTE — ED Triage Notes (Signed)
Patient reports tht his right pinky nail off 3 days ago. The right big toe nail is loose. Patient is diabetic.

## 2021-02-20 ENCOUNTER — Other Ambulatory Visit: Payer: Self-pay

## 2021-02-20 ENCOUNTER — Encounter (HOSPITAL_BASED_OUTPATIENT_CLINIC_OR_DEPARTMENT_OTHER): Payer: Self-pay

## 2021-02-20 ENCOUNTER — Emergency Department (HOSPITAL_BASED_OUTPATIENT_CLINIC_OR_DEPARTMENT_OTHER)
Admission: EM | Admit: 2021-02-20 | Discharge: 2021-02-21 | Disposition: A | Discharge status: Home / Self Care | Payer: BLUE CROSS/BLUE SHIELD | Attending: Emergency Medicine | Admitting: Emergency Medicine

## 2021-02-20 DIAGNOSIS — Z7982 Long term (current) use of aspirin: Secondary | ICD-10-CM | POA: Insufficient documentation

## 2021-02-20 DIAGNOSIS — H1033 Unspecified acute conjunctivitis, bilateral: Secondary | ICD-10-CM | POA: Diagnosis not present

## 2021-02-20 DIAGNOSIS — Z79899 Other long term (current) drug therapy: Secondary | ICD-10-CM | POA: Insufficient documentation

## 2021-02-20 DIAGNOSIS — H109 Unspecified conjunctivitis: Secondary | ICD-10-CM

## 2021-02-20 DIAGNOSIS — E1122 Type 2 diabetes mellitus with diabetic chronic kidney disease: Secondary | ICD-10-CM | POA: Diagnosis not present

## 2021-02-20 DIAGNOSIS — Z87891 Personal history of nicotine dependence: Secondary | ICD-10-CM | POA: Insufficient documentation

## 2021-02-20 DIAGNOSIS — Z794 Long term (current) use of insulin: Secondary | ICD-10-CM | POA: Diagnosis not present

## 2021-02-20 DIAGNOSIS — Z8616 Personal history of COVID-19: Secondary | ICD-10-CM | POA: Diagnosis not present

## 2021-02-20 DIAGNOSIS — I25119 Atherosclerotic heart disease of native coronary artery with unspecified angina pectoris: Secondary | ICD-10-CM | POA: Diagnosis not present

## 2021-02-20 DIAGNOSIS — E1110 Type 2 diabetes mellitus with ketoacidosis without coma: Secondary | ICD-10-CM | POA: Diagnosis not present

## 2021-02-20 DIAGNOSIS — I129 Hypertensive chronic kidney disease with stage 1 through stage 4 chronic kidney disease, or unspecified chronic kidney disease: Secondary | ICD-10-CM | POA: Diagnosis not present

## 2021-02-20 DIAGNOSIS — N189 Chronic kidney disease, unspecified: Secondary | ICD-10-CM | POA: Insufficient documentation

## 2021-02-20 DIAGNOSIS — H5789 Other specified disorders of eye and adnexa: Secondary | ICD-10-CM | POA: Diagnosis present

## 2021-02-20 NOTE — ED Triage Notes (Signed)
Pt c/o of eye drainage on  both eyes  that started yesterday and sorethroat since a wk ago - denies fever /

## 2021-02-21 MED ORDER — POLYMYXIN B-TRIMETHOPRIM 10000-0.1 UNIT/ML-% OP SOLN: 1.0000 [drp] | OPHTHALMIC | 0 refills | Status: DC

## 2021-02-21 NOTE — Discharge Instructions (Addendum)
You were seen today for drainage of the bilateral eyes.  This is consistent with conjunctivitis.  This can be related to bacteria, viruses, or allergies.  If you note persistent symptoms after antibiotic usage, this could be related to recently getting a cat.  Do not rub the eyes with your hands and make sure you are washing your hands frequently.  You may use warm compresses to help Wipe Away drainage

## 2021-02-21 NOTE — ED Provider Notes (Signed)
[facility] EMERGENCY DEPT Provider Note   CSN: [id number] Arrival date & time: 02/20/21  2209     History Chief Complaint  Patient presents with   Eye Drainage   Sore Throat    Howard Mcmahon is a 50 y.o. male.  HPI     This is a 50 year old male with history of diabetes, hypertension who presents with bilateral eye drainage.  Patient reports that initially started in the right eye and then went to the left eye.  He has not noted any vision difficulties but states that sometimes the drainage "gets in the way."  At baseline he states he has some sore throat but that is not necessarily abnormal for him.  He reports no recent fevers.  He has previously had COVID and does not have any known recent sick contacts.  No chest pain, shortness of breath, abdominal pain, nausea, vomiting.  He does state that he recently got a cat.  Past Medical History:  Diagnosis Date   Chronic kidney disease    Diabetes mellitus without complication ([place])    Hypertension    Macular infarction    2012   MI (myocardial infarction) ([place])    Peripheral vascular disease ([place])    Neuropathy   Pneumonia     Patient Active Problem List   Diagnosis Date Noted   COVID-19 virus infection 10/13/2019   AKI (acute kidney injury) ([place]) 10/13/2019   Generalized weakness 10/13/2019   Left sided numbness    Fever    HCAP (healthcare-associated pneumonia)    Blood poisoning    Diabetic ketoacidosis without coma associated with diabetes mellitus due to underlying condition ([place])    SVT (supraventricular tachycardia) ([place])    HHNC (hyperglycemic hyperosmolar nonketotic coma) ([place])    Essential hypertension    Pain in the chest    Coronary artery disease with unspecified angina pectoris    HLD (hyperlipidemia)    Leukocytosis    Numbness and tingling in left hand    Noncompliance with medication regimen    Chest pain 08/26/2014   Paresthesia of left arm 08/26/2014   Hyperglycemia due to type 2  diabetes mellitus ([place]) 08/26/2014   DM (diabetes mellitus) ([place]) 01/25/2014   DKA (diabetic ketoacidoses) 01/09/2014   Type II or unspecified type diabetes mellitus with unspecified complication, uncontrolled 01/09/2014   Essential hypertension, benign 01/09/2014   CAD (coronary artery disease) 01/09/2014   Fever presenting with conditions classified elsewhere 01/09/2014   Leukocytosis, unspecified 01/09/2014   Elevated lactic acid level 01/09/2014    Past Surgical History:  Procedure Laterality Date   CARDIAC SURGERY     CORONARY ANGIOPLASTY WITH STENT PLACEMENT     CORONARY ARTERY BYPASS GRAFT     HIP ARTHROPLASTY Right        Family History  Problem Relation Age of Onset   Stroke Mother     Social History   Tobacco Use   Smoking status: Former    Packs/day: 2.00    Years: 30.00    Pack years: 60.00    Types: Cigarettes   Smokeless tobacco: Current  Vaping Use   Vaping Use: Never used  Substance Use Topics   Alcohol use: No   Drug use: No    Home Medications Prior to Admission medications   Medication Sig Start Date End Date Taking? Authorizing Provider  trimethoprim-polymyxin b (POLYTRIM) ophthalmic solution Place 1 drop into both eyes every 4 (four) hours. 02/21/21  Yes Gianny Sabino, Barbette Hair, MD  acetaminophen (TYLENOL)  500 MG tablet Take 2 tablets (1,000 mg total) by mouth every 6 (six) hours as needed. Patient taking differently: Take 1,000 mg by mouth every 6 (six) hours as needed for mild pain. 12/13/20   Charlesetta Shanks, MD  aspirin EC 81 MG tablet Take 81 mg by mouth daily.    [provider]  atorvastatin (LIPITOR) 80 MG tablet Take 1 tablet (80 mg total) by mouth daily. 10/20/19 11/19/19  Bonnielee Haff, MD  Blood Glucose Monitoring Suppl (TRUE METRIX METER) w/Device KIT Use as directed 01/02/19   Kerin Perna, NP  clopidogrel (PLAVIX) 75 MG tablet Take 1 tablet (75 mg total) by mouth daily with breakfast. 10/20/19   Bonnielee Haff, MD  glucose  blood (TRUE METRIX BLOOD GLUCOSE TEST) test strip Check blood sugar fasting and before meals and again if pt feels bad (symptoms of hypo). Check sugar three times a day 10/10/19   Charlann Lange, PA-C  Insulin Isophane & Regular Human (NOVOLIN 70/30 FLEXPEN RELION) (70-30) 100 UNIT/ML PEN Inject 18 Units into the skin 2 (two) times daily. Patient taking differently: Inject 36 Units into the skin 2 (two) times daily. 10/20/19   Bonnielee Haff, MD  Insulin Pen Needle (NOVOFINE) 30G X 8 MM MISC Inject 10 each into the skin as needed. 10/20/19   Bonnielee Haff, MD  metFORMIN (GLUCOPHAGE-XR) 500 MG 24 hr tablet Take 1 tablet (500 mg total) by mouth daily with breakfast. Patient not taking: No sig reported 10/20/19   Bonnielee Haff, MD  metoprolol tartrate (LOPRESSOR) 50 MG tablet Take 50 mg by mouth in the morning and at bedtime.    [provider]  omega-3 acid ethyl esters (LOVAZA) 1 g capsule Take 2 capsules (2 g total) by mouth 2 (two) times daily. 01/05/19   Kerin Perna, NP  pantoprazole (PROTONIX) 40 MG tablet Take 1 tablet (40 mg total) by mouth 2 (two) times daily. 10/20/19   Bonnielee Haff, MD    Allergies    Lisinopril, Lactose intolerance (gi), and Other  Review of Systems   Review of Systems  Constitutional:  Negative for fever.  HENT:  Positive for sore throat. Negative for congestion, ear pain and trouble swallowing.   Eyes:  Positive for discharge and redness. Negative for photophobia and visual disturbance.  Respiratory:  Negative for shortness of breath.   Cardiovascular: Negative.   Gastrointestinal:  Negative for abdominal pain, nausea and vomiting.  All other systems reviewed and are negative.  Physical Exam Updated Vital Signs BP (!) 157/98 (BP Location: Right Arm)   Pulse 90   Temp 98.1 F (36.7 C) (Oral)   Resp 18   Ht 1.93 m (_0 )   Wt 122.5 kg   SpO2 98%   BMI 32.87 kg/m   Physical Exam Vitals and nursing note reviewed.  Constitutional:       Appearance: He is well-developed. He is obese.     Comments: Chronically ill-appearing but nontoxic  HENT:     Head: Normocephalic and atraumatic.     Ears:     Comments: Full TMs bilaterally, no significant erythema, effusion bilaterally    Mouth/Throat:     Mouth: Mucous membranes are moist.     Comments: Slightly edematous uvula, uvula midline, no tonsillar swelling or exudate Eyes:     Pupils: Pupils are equal, round, and reactive to light.     Comments: Bilateral injected conjunctive a with purulent discharge bilaterally  Cardiovascular:     Rate and Rhythm: Normal  rate and regular rhythm.     Heart sounds: Normal heart sounds. No murmur heard. Pulmonary:     Effort: Pulmonary effort is normal. No respiratory distress.     Breath sounds: Normal breath sounds. No wheezing.  Abdominal:     General: Bowel sounds are normal.     Palpations: Abdomen is soft.     Tenderness: There is no abdominal tenderness. There is no rebound.  Musculoskeletal:     Cervical back: Neck supple.  Lymphadenopathy:     Cervical: No cervical adenopathy.  Skin:    General: Skin is warm and dry.  Neurological:     Mental Status: He is alert and oriented to person, place, and time.  Psychiatric:        Mood and Affect: Mood normal.    ED Results / Procedures / Treatments   Labs (all labs ordered are listed, but only abnormal results are displayed) Labs Reviewed - No data to display  EKG None  Radiology No results found.  Procedures Procedures   Medications Ordered in ED Medications - No data to display  ED Course  I have reviewed the triage vital signs and the nursing notes.  Pertinent labs & imaging results that were available during my care of the patient were reviewed by me and considered in my medical decision making (see chart for details).    MDM Rules/Calculators/A&P                          Patient presents with bilateral eye drainage.  He is overall nontoxic and vital  signs are notable for blood pressure 157/98.  He is afebrile.  He has purulent drainage bilaterally.  Highly suspect viral etiology given sore throat.  However, he does report recently getting a cat.  Would suspect if this was allergic it would be more clear in nature.  He has a slightly edematous uvula but states "I have always had that."  He is not having any difficulty swallowing or stridor.  He does not wish to be tested for COVID-19.  Given symptoms, adenovirus would also be a consideration.  Unable to fully rule out bacterial source as well.  For this reason we will provide with Polytrim given the purulent nature of the drainage.  Patient was given strict return precautions  After history, exam, and medical workup I feel the patient has been appropriately medically screened and is safe for discharge home. Pertinent diagnoses were discussed with the patient. Patient was given return precautions.  Final Clinical Impression(s) / ED Diagnoses Final diagnoses:  Conjunctivitis of both eyes, unspecified conjunctivitis type    Rx / DC Orders ED Discharge Orders          Ordered    trimethoprim-polymyxin b (POLYTRIM) ophthalmic solution  Every 4 hours        02/21/21 0024             Kshawn Canal, Barbette Hair, MD 02/21/21 [phone]

## 2021-03-17 ENCOUNTER — Emergency Department (HOSPITAL_COMMUNITY)
Admission: EM | Admit: 2021-03-17 | Discharge: 2021-03-18 | Disposition: A | Discharge status: Home / Self Care | Payer: BLUE CROSS/BLUE SHIELD | Attending: Emergency Medicine | Admitting: Emergency Medicine

## 2021-03-17 ENCOUNTER — Encounter (HOSPITAL_COMMUNITY): Payer: Self-pay

## 2021-03-17 ENCOUNTER — Other Ambulatory Visit: Payer: Self-pay

## 2021-03-17 DIAGNOSIS — I129 Hypertensive chronic kidney disease with stage 1 through stage 4 chronic kidney disease, or unspecified chronic kidney disease: Secondary | ICD-10-CM | POA: Diagnosis not present

## 2021-03-17 DIAGNOSIS — E1140 Type 2 diabetes mellitus with diabetic neuropathy, unspecified: Secondary | ICD-10-CM | POA: Insufficient documentation

## 2021-03-17 DIAGNOSIS — S91302A Unspecified open wound, left foot, initial encounter: Secondary | ICD-10-CM | POA: Insufficient documentation

## 2021-03-17 DIAGNOSIS — I2510 Atherosclerotic heart disease of native coronary artery without angina pectoris: Secondary | ICD-10-CM | POA: Insufficient documentation

## 2021-03-17 DIAGNOSIS — Z794 Long term (current) use of insulin: Secondary | ICD-10-CM | POA: Diagnosis not present

## 2021-03-17 DIAGNOSIS — X58XXXA Exposure to other specified factors, initial encounter: Secondary | ICD-10-CM | POA: Diagnosis not present

## 2021-03-17 DIAGNOSIS — E1122 Type 2 diabetes mellitus with diabetic chronic kidney disease: Secondary | ICD-10-CM | POA: Diagnosis not present

## 2021-03-17 DIAGNOSIS — Z79899 Other long term (current) drug therapy: Secondary | ICD-10-CM | POA: Insufficient documentation

## 2021-03-17 DIAGNOSIS — N189 Chronic kidney disease, unspecified: Secondary | ICD-10-CM | POA: Diagnosis not present

## 2021-03-17 DIAGNOSIS — Z8616 Personal history of COVID-19: Secondary | ICD-10-CM | POA: Insufficient documentation

## 2021-03-17 DIAGNOSIS — Z7982 Long term (current) use of aspirin: Secondary | ICD-10-CM | POA: Diagnosis not present

## 2021-03-17 DIAGNOSIS — E1165 Type 2 diabetes mellitus with hyperglycemia: Secondary | ICD-10-CM | POA: Insufficient documentation

## 2021-03-17 DIAGNOSIS — Z7984 Long term (current) use of oral hypoglycemic drugs: Secondary | ICD-10-CM | POA: Diagnosis not present

## 2021-03-17 DIAGNOSIS — L84 Corns and callosities: Secondary | ICD-10-CM

## 2021-03-17 DIAGNOSIS — Z87891 Personal history of nicotine dependence: Secondary | ICD-10-CM | POA: Diagnosis not present

## 2021-03-17 DIAGNOSIS — S90822A Blister (nonthermal), left foot, initial encounter: Secondary | ICD-10-CM

## 2021-03-17 NOTE — ED Triage Notes (Signed)
Pt came in with c/o L foot blister. It is on pt's L great toe. Pt seen at his providers office for same on Tue and was told that there was nothing wrong. Pt not c/o pain. Pt is diabetic

## 2021-03-18 ENCOUNTER — Emergency Department (HOSPITAL_COMMUNITY): Payer: BLUE CROSS/BLUE SHIELD

## 2021-03-18 LAB — BASIC METABOLIC PANEL
Anion gap: 7 (ref 5–15)
BUN: 34 mg/dL — ABNORMAL HIGH (ref 6–20)
CO2: 23 mmol/L (ref 22–32)
Calcium: 8.7 mg/dL — ABNORMAL LOW (ref 8.9–10.3)
Chloride: 113 mmol/L — ABNORMAL HIGH (ref 98–111)
Creatinine, Ser: 2.53 mg/dL — ABNORMAL HIGH (ref 0.61–1.24)
GFR, Estimated: 30 mL/min — ABNORMAL LOW (ref >60)
Glucose, Bld: 191 mg/dL — ABNORMAL HIGH (ref 70–99)
Potassium: 4.4 mmol/L (ref 3.5–5.1)
Sodium: 143 mmol/L (ref 135–145)

## 2021-03-18 LAB — CBC WITH DIFFERENTIAL/PLATELET
Abs Immature Granulocytes: 0.09 10*3/uL — ABNORMAL HIGH (ref 0.00–0.07)
Basophils Absolute: 0.1 10*3/uL (ref 0.0–0.1)
Basophils Relative: 1 %
Eosinophils Absolute: 0.3 10*3/uL (ref 0.0–0.5)
Eosinophils Relative: 4 %
HCT: 33.1 % — ABNORMAL LOW (ref 39.0–52.0)
Hemoglobin: 10.3 g/dL — ABNORMAL LOW (ref 13.0–17.0)
Immature Granulocytes: 1 %
Lymphocytes Relative: 26 %
Lymphs Abs: 2.1 10*3/uL (ref 0.7–4.0)
MCH: 29.9 pg (ref 26.0–34.0)
MCHC: 31.1 g/dL (ref 30.0–36.0)
MCV: 96.2 fL (ref 80.0–100.0)
Monocytes Absolute: 0.7 10*3/uL (ref 0.1–1.0)
Monocytes Relative: 9 %
Neutro Abs: 5 10*3/uL (ref 1.7–7.7)
Neutrophils Relative %: 59 %
Platelets: 241 10*3/uL (ref 150–400)
RBC: 3.44 MIL/uL — ABNORMAL LOW (ref 4.22–5.81)
RDW: 13.1 % (ref 11.5–15.5)
WBC: 8.3 10*3/uL (ref 4.0–10.5)
nRBC: 0 % (ref 0.0–0.2)

## 2021-03-18 NOTE — Discharge Instructions (Addendum)
Thank you for allowing me to care for you today in the Emergency Department.   Your work-up today was reassuring.  The wound on her great toe looks like a callus.  You can follow-up with endocrinology and podiatry.  Make sure that you are checking her feet regularly for wounds.  Make sure to wear socks and wear closed toed shoes.  Do not walk around outside barefoot in the heat since you have decreased sensation to the feet.  Return to the emergency department if you develop swelling, redness, red streaking, foul-smelling, mucus-like drainage from the wounds on the feet, fever, chills, uncontrollable vomiting, or other new, concerning symptoms.

## 2021-03-18 NOTE — ED Provider Notes (Signed)
[facility] DEPT Provider Note   CSN: [id number] Arrival date & time: 03/17/21  2235     History Chief Complaint  Patient presents with   Foot Pain    Howard Mcmahon is a 50 y.o. male with a history of diabetes mellitus type 2 complicated by peripheral neuropathy, CKD, CAD with a history of MI who presents to the emergency department with a chief complaint of left foot wounds.  The patient reports that he has noticed changes to the sole of the left great toe for the last 2 to 3 weeks.  He is unsure if it is painful as he has neuropathy in his bilateral legs up to the level of his knees.  He also reports that he has noticed a blister to the entire heel and sole of the foot that began around the same time on the left foot.  He denies redness, swelling, drainage, fever, chills.  No known aggravating or alleviating factors.  He reports that his sugars have been running high over the last week and it frequently been in the 300s.  No blurry vision, polyuria, abdominal pain, vomiting, diarrhea, weakness, urinary frequency, or dysuria.  The history is provided by the patient and medical records. No language interpreter was used.      Past Medical History:  Diagnosis Date   Chronic kidney disease    Diabetes mellitus without complication ([place])    Hypertension    Macular infarction    2012   MI (myocardial infarction) ([place])    Peripheral vascular disease ([place])    Neuropathy   Pneumonia     Patient Active Problem List   Diagnosis Date Noted   COVID-19 virus infection 10/13/2019   AKI (acute kidney injury) ([place]) 10/13/2019   Generalized weakness 10/13/2019   Left sided numbness    Fever    HCAP (healthcare-associated pneumonia)    Blood poisoning    Diabetic ketoacidosis without coma associated with diabetes mellitus due to underlying condition ([place])    SVT (supraventricular tachycardia) ([place])    HHNC (hyperglycemic hyperosmolar nonketotic coma) ([place])     Essential hypertension    Pain in the chest    Coronary artery disease with unspecified angina pectoris    HLD (hyperlipidemia)    Leukocytosis    Numbness and tingling in left hand    Noncompliance with medication regimen    Chest pain 08/26/2014   Paresthesia of left arm 08/26/2014   Hyperglycemia due to type 2 diabetes mellitus ([place]) 08/26/2014   DM (diabetes mellitus) ([place]) 01/25/2014   DKA (diabetic ketoacidoses) 01/09/2014   Type II or unspecified type diabetes mellitus with unspecified complication, uncontrolled 01/09/2014   Essential hypertension, benign 01/09/2014   CAD (coronary artery disease) 01/09/2014   Fever presenting with conditions classified elsewhere 01/09/2014   Leukocytosis, unspecified 01/09/2014   Elevated lactic acid level 01/09/2014    Past Surgical History:  Procedure Laterality Date   CARDIAC SURGERY     CORONARY ANGIOPLASTY WITH STENT PLACEMENT     CORONARY ARTERY BYPASS GRAFT     HIP ARTHROPLASTY Right        Family History  Problem Relation Age of Onset   Stroke Mother     Social History   Tobacco Use   Smoking status: Former    Packs/day: 2.00    Years: 30.00    Pack years: 60.00    Types: Cigarettes   Smokeless tobacco: Current  Vaping Use   Vaping Use: Never used  Substance Use Topics   Alcohol use: No   Drug use: No    Home Medications Prior to Admission medications   Medication Sig Start Date End Date Taking? Authorizing Provider  acetaminophen (TYLENOL) 500 MG tablet Take 2 tablets (1,000 mg total) by mouth every 6 (six) hours as needed. Patient taking differently: Take 1,000 mg by mouth every 6 (six) hours as needed for mild pain. 12/13/20   Charlesetta Shanks, MD  aspirin EC 81 MG tablet Take 81 mg by mouth daily.    [provider]  atorvastatin (LIPITOR) 80 MG tablet Take 1 tablet (80 mg total) by mouth daily. 10/20/19 11/19/19  Bonnielee Haff, MD  Blood Glucose Monitoring Suppl (TRUE METRIX METER) w/Device KIT Use  as directed 01/02/19   Kerin Perna, NP  clopidogrel (PLAVIX) 75 MG tablet Take 1 tablet (75 mg total) by mouth daily with breakfast. 10/20/19   Bonnielee Haff, MD  glucose blood (TRUE METRIX BLOOD GLUCOSE TEST) test strip Check blood sugar fasting and before meals and again if pt feels bad (symptoms of hypo). Check sugar three times a day 10/10/19   Charlann Lange, PA-C  Insulin Isophane & Regular Human (NOVOLIN 70/30 FLEXPEN RELION) (70-30) 100 UNIT/ML PEN Inject 18 Units into the skin 2 (two) times daily. Patient taking differently: Inject 36 Units into the skin 2 (two) times daily. 10/20/19   Bonnielee Haff, MD  Insulin Pen Needle (NOVOFINE) 30G X 8 MM MISC Inject 10 each into the skin as needed. 10/20/19   Bonnielee Haff, MD  metFORMIN (GLUCOPHAGE-XR) 500 MG 24 hr tablet Take 1 tablet (500 mg total) by mouth daily with breakfast. Patient not taking: No sig reported 10/20/19   Bonnielee Haff, MD  metoprolol tartrate (LOPRESSOR) 50 MG tablet Take 50 mg by mouth in the morning and at bedtime.    [provider]  omega-3 acid ethyl esters (LOVAZA) 1 g capsule Take 2 capsules (2 g total) by mouth 2 (two) times daily. 01/05/19   Kerin Perna, NP  pantoprazole (PROTONIX) 40 MG tablet Take 1 tablet (40 mg total) by mouth 2 (two) times daily. 10/20/19   Bonnielee Haff, MD  trimethoprim-polymyxin b (POLYTRIM) ophthalmic solution Place 1 drop into both eyes every 4 (four) hours. 02/21/21   Horton, Barbette Hair, MD    Allergies    Lisinopril, Lactose intolerance (gi), and Other  Review of Systems   Review of Systems  Constitutional:  Negative for appetite change, chills and fever.  HENT:  Negative for congestion and sore throat.   Respiratory:  Negative for shortness of breath and wheezing.   Cardiovascular:  Negative for chest pain and palpitations.  Gastrointestinal:  Negative for abdominal pain, blood in stool, diarrhea, nausea and vomiting.  Genitourinary:  Negative for dysuria.   Musculoskeletal:  Negative for back pain.  Skin:  Positive for wound. Negative for rash.  Allergic/Immunologic: Positive for immunocompromised state.  Neurological:  Negative for dizziness, seizures, syncope, weakness, numbness and headaches.  Psychiatric/Behavioral:  Negative for confusion.    Physical Exam Updated Vital Signs BP 136/89   Pulse 85   Temp 99.1 F (37.3 C) (Oral)   Resp 17   Ht _0  (1.93 m)   Wt 122.5 kg   SpO2 100%   BMI 32.87 kg/m   Physical Exam Vitals and nursing note reviewed.  Constitutional:      Appearance: He is well-developed.  HENT:     Head: Normocephalic.  Eyes:     Conjunctiva/sclera: Conjunctivae  normal.  Cardiovascular:     Rate and Rhythm: Normal rate and regular rhythm.     Heart sounds: No murmur heard. Pulmonary:     Effort: Pulmonary effort is normal. No respiratory distress.     Breath sounds: No stridor. No wheezing, rhonchi or rales.  Chest:     Chest wall: No tenderness.  Abdominal:     General: There is no distension.     Palpations: Abdomen is soft.  Musculoskeletal:     Cervical back: Neck supple.     Comments: Good strength against resistance of the bilateral lower extremities.  Good capillary refill.  Skin:    General: Skin is warm and dry.     Comments: Callus skin changes with ecchymotic appearance noted to the sole of the left great toe.  See photo below.  There is a bulla noted to the heel that extends to the sole of the foot.  No erythema, warmth, red streaking.  No drainage noted.  Neurological:     Mental Status: He is alert.  Psychiatric:        Behavior: Behavior normal.       ED Results / Procedures / Treatments   Labs (all labs ordered are listed, but only abnormal results are displayed) Labs Reviewed - No data to display  EKG None  Radiology No results found.  Procedures Procedures   Medications Ordered in ED Medications - No data to display  ED Course  I have reviewed the triage  vital signs and the nursing notes.  Pertinent labs & imaging results that were available during my care of the patient were reviewed by me and considered in my medical decision making (see chart for details).    MDM Rules/Calculators/A&P                          50 year old male with a history of diabetes mellitus type 2 complicated by peripheral neuropathy, CKD, CAD with a history of MI who presents the emergency department with multiple foot wounds that have been present to the left foot for the last 2 to 3 weeks.  He is concerned for infection gangrene.  No recent constitutional symptoms.  No pain as patient has diabetic peripheral neuropathy to the bilateral lower extremities.  Vital signs are stable.  Labs and imaging of been reviewed and independently interpreted by me.  X-ray with no periosteal changes, gas, or soft tissue edema.  Glucose is 191 with normal bicarb and anion gap.  Creatinine is 2.53, but this is improved from 2.8 in April.  No leukocytosis.  Labs are otherwise reassuring.  Doubt osteomyelitis, dry or wet gangrene, cellulitis, abscess, myositis, felon, or pulp infection.  Discussed the labs and imaging with the patient.  Suspect that this is a callus on the base of the great toe.  Question if the blister is secondary to a recent heat injury.  The patient is unsure.  Home wound care instructions were given.  He can follow-up with podiatry, primary care, or endocrinology.  All questions answered.  ER return precautions given.  Safer discharge home with outpatient follow-up as discussed.  Final Clinical Impression(s) / ED Diagnoses Final diagnoses:  None    Rx / DC Orders ED Discharge Orders     None        Aleister Lady A, PA-C 03/18/21 0719    Veryl Speak, MD 03/18/21 2304

## 2021-03-18 NOTE — ED Notes (Signed)
Patient sleeping under blanket.  No complaints at this.

## 2021-04-14 ENCOUNTER — Other Ambulatory Visit: Payer: Self-pay

## 2021-04-14 ENCOUNTER — Encounter (HOSPITAL_BASED_OUTPATIENT_CLINIC_OR_DEPARTMENT_OTHER): Payer: Self-pay | Admitting: *Deleted

## 2021-04-14 ENCOUNTER — Emergency Department (HOSPITAL_BASED_OUTPATIENT_CLINIC_OR_DEPARTMENT_OTHER)
Admission: EM | Admit: 2021-04-14 | Discharge: 2021-04-14 | Disposition: A | Discharge status: Home / Self Care | Payer: BLUE CROSS/BLUE SHIELD | Attending: Emergency Medicine | Admitting: Emergency Medicine

## 2021-04-14 DIAGNOSIS — Z5321 Procedure and treatment not carried out due to patient leaving prior to being seen by health care provider: Secondary | ICD-10-CM | POA: Insufficient documentation

## 2021-04-14 DIAGNOSIS — R58 Hemorrhage, not elsewhere classified: Secondary | ICD-10-CM | POA: Diagnosis present

## 2021-04-14 DIAGNOSIS — Z4800 Encounter for change or removal of nonsurgical wound dressing: Secondary | ICD-10-CM | POA: Insufficient documentation

## 2021-04-14 DIAGNOSIS — E119 Type 2 diabetes mellitus without complications: Secondary | ICD-10-CM | POA: Insufficient documentation

## 2021-04-14 NOTE — ED Triage Notes (Signed)
Pt states peeling thick skin on bottom of left heel which is peeling off, reports bleeding from area last night. Pt is diabetic

## 2021-04-15 ENCOUNTER — Emergency Department (HOSPITAL_COMMUNITY)
Admission: EM | Admit: 2021-04-15 | Discharge: 2021-04-15 | Disposition: A | Discharge status: Home / Self Care | Payer: BLUE CROSS/BLUE SHIELD | Attending: Emergency Medicine | Admitting: Emergency Medicine

## 2021-04-15 ENCOUNTER — Other Ambulatory Visit: Payer: Self-pay

## 2021-04-15 ENCOUNTER — Emergency Department (HOSPITAL_COMMUNITY): Payer: BLUE CROSS/BLUE SHIELD

## 2021-04-15 DIAGNOSIS — Z96641 Presence of right artificial hip joint: Secondary | ICD-10-CM | POA: Insufficient documentation

## 2021-04-15 DIAGNOSIS — I129 Hypertensive chronic kidney disease with stage 1 through stage 4 chronic kidney disease, or unspecified chronic kidney disease: Secondary | ICD-10-CM | POA: Diagnosis not present

## 2021-04-15 DIAGNOSIS — Z87891 Personal history of nicotine dependence: Secondary | ICD-10-CM | POA: Insufficient documentation

## 2021-04-15 DIAGNOSIS — Z7982 Long term (current) use of aspirin: Secondary | ICD-10-CM | POA: Diagnosis not present

## 2021-04-15 DIAGNOSIS — Z7902 Long term (current) use of antithrombotics/antiplatelets: Secondary | ICD-10-CM | POA: Diagnosis not present

## 2021-04-15 DIAGNOSIS — E1110 Type 2 diabetes mellitus with ketoacidosis without coma: Secondary | ICD-10-CM | POA: Diagnosis not present

## 2021-04-15 DIAGNOSIS — X58XXXA Exposure to other specified factors, initial encounter: Secondary | ICD-10-CM | POA: Insufficient documentation

## 2021-04-15 DIAGNOSIS — Z794 Long term (current) use of insulin: Secondary | ICD-10-CM | POA: Insufficient documentation

## 2021-04-15 DIAGNOSIS — Z7984 Long term (current) use of oral hypoglycemic drugs: Secondary | ICD-10-CM | POA: Diagnosis not present

## 2021-04-15 DIAGNOSIS — Z8616 Personal history of COVID-19: Secondary | ICD-10-CM | POA: Diagnosis not present

## 2021-04-15 DIAGNOSIS — S91302A Unspecified open wound, left foot, initial encounter: Secondary | ICD-10-CM | POA: Diagnosis not present

## 2021-04-15 DIAGNOSIS — Z7951 Long term (current) use of inhaled steroids: Secondary | ICD-10-CM | POA: Insufficient documentation

## 2021-04-15 DIAGNOSIS — E1122 Type 2 diabetes mellitus with diabetic chronic kidney disease: Secondary | ICD-10-CM | POA: Diagnosis not present

## 2021-04-15 DIAGNOSIS — N189 Chronic kidney disease, unspecified: Secondary | ICD-10-CM | POA: Diagnosis not present

## 2021-04-15 DIAGNOSIS — S99922A Unspecified injury of left foot, initial encounter: Secondary | ICD-10-CM | POA: Diagnosis present

## 2021-04-15 DIAGNOSIS — Z951 Presence of aortocoronary bypass graft: Secondary | ICD-10-CM | POA: Diagnosis not present

## 2021-04-15 DIAGNOSIS — I25119 Atherosclerotic heart disease of native coronary artery with unspecified angina pectoris: Secondary | ICD-10-CM | POA: Diagnosis not present

## 2021-04-15 LAB — CBC WITH DIFFERENTIAL/PLATELET
Abs Immature Granulocytes: 0.06 10*3/uL (ref 0.00–0.07)
Basophils Absolute: 0 10*3/uL (ref 0.0–0.1)
Basophils Relative: 0 %
Eosinophils Absolute: 0.3 10*3/uL (ref 0.0–0.5)
Eosinophils Relative: 3 %
HCT: 34 % — ABNORMAL LOW (ref 39.0–52.0)
Hemoglobin: 11.1 g/dL — ABNORMAL LOW (ref 13.0–17.0)
Immature Granulocytes: 1 %
Lymphocytes Relative: 21 %
Lymphs Abs: 2.1 10*3/uL (ref 0.7–4.0)
MCH: 30.2 pg (ref 26.0–34.0)
MCHC: 32.6 g/dL (ref 30.0–36.0)
MCV: 92.6 fL (ref 80.0–100.0)
Monocytes Absolute: 0.7 10*3/uL (ref 0.1–1.0)
Monocytes Relative: 7 %
Neutro Abs: 7 10*3/uL (ref 1.7–7.7)
Neutrophils Relative %: 68 %
Platelets: 192 10*3/uL (ref 150–400)
RBC: 3.67 MIL/uL — ABNORMAL LOW (ref 4.22–5.81)
RDW: 12.5 % (ref 11.5–15.5)
WBC: 10.2 10*3/uL (ref 4.0–10.5)
nRBC: 0 % (ref 0.0–0.2)

## 2021-04-15 LAB — BASIC METABOLIC PANEL
Anion gap: 10 (ref 5–15)
BUN: 52 mg/dL — ABNORMAL HIGH (ref 6–20)
CO2: 21 mmol/L — ABNORMAL LOW (ref 22–32)
Calcium: 8.9 mg/dL (ref 8.9–10.3)
Chloride: 102 mmol/L (ref 98–111)
Creatinine, Ser: 3.54 mg/dL — ABNORMAL HIGH (ref 0.61–1.24)
GFR, Estimated: 20 mL/min — ABNORMAL LOW (ref >60)
Glucose, Bld: 496 mg/dL — ABNORMAL HIGH (ref 70–99)
Potassium: 4.4 mmol/L (ref 3.5–5.1)
Sodium: 133 mmol/L — ABNORMAL LOW (ref 135–145)

## 2021-04-15 LAB — CBG MONITORING, ED: Glucose-Capillary: 479 mg/dL — ABNORMAL HIGH (ref 70–99)

## 2021-04-15 NOTE — Discharge Instructions (Addendum)
As discussed, your evaluation today has been largely reassuring.  But, it is important that you monitor your condition carefully, and do not hesitate to return to the ED if you develop new, or concerning changes in your condition. ? ?Otherwise, please follow-up with your physician for appropriate ongoing care. ? ?

## 2021-04-15 NOTE — ED Notes (Signed)
2 gold, 1 blue, 1 light green, 1 dark green, 1 lavender tube sent to lab. [employer]

## 2021-04-15 NOTE — ED Provider Notes (Signed)
Emergency Medicine Provider Triage Evaluation Note  Howard Mcmahon , a 50 y.o. male  was evaluated in triage.  Pt complains of left foot wounds after pulling off his callouses, pain and bleeding x 2 days. Diabetic.  Review of Systems  Positive: Wounds to left foot Negative: N/V/fevers, chills  Physical Exam  BP (!) 166/100 (BP Location: Left Arm)   Pulse (!) 102   Temp 98.4 F (36.9 C) (Oral)   Resp 20   Ht '6\' 4"'$  (1.93 m)   SpO2 97%   BMI 32.87 kg/m  Gen:   Awake, no distress   Resp:  Normal effort  MSK:   Moves extremities without difficulty  Other:  Wounds to the left heel, big toe on the right  Medical Decision Making  Medically screening exam initiated at 9:29 AM.  Appropriate orders placed.  Howard Mcmahon was informed that the remainder of the evaluation will be completed by another provider, this initial triage assessment does not replace that evaluation, and the importance of remaining in the ED until their evaluation is complete.  This chart was dictated using voice recognition software, Dragon. Despite the best efforts of this provider to proofread and correct errors, errors may still occur which can change documentation meaning.    Emeline Darling, PA-C 04/15/21 0930    Carmin Muskrat, MD 04/15/21 1121

## 2021-04-15 NOTE — ED Triage Notes (Signed)
Pt POV reports calluses to left foot, which he peeled off.  Pt reports bleeding from bottom of foot x2 days.

## 2021-04-15 NOTE — ED Provider Notes (Signed)
[facility] DEPT Provider Note   CSN: [id number] Arrival date & time: 04/15/21  0850     History Chief Complaint  Patient presents with   Foot Ulcer    Halbert Kendall is a 50 y.o. male.  HPI Patient presents with concern of left foot wound that is open. Patient has diabetes, CKD, does not receive regular wound care.  He notes that over the past day the sclerotic covering of his left heel wound has broken free, and there is mild bleeding.  No fever, nausea, pain no inability to move the toes, ankle, foot.    Past Medical History:  Diagnosis Date   Chronic kidney disease    Diabetes mellitus without complication ([place])    Hypertension    Macular infarction    2012   MI (myocardial infarction) ([place])    Peripheral vascular disease ([place])    Neuropathy   Pneumonia     Patient Active Problem List   Diagnosis Date Noted   COVID-19 virus infection 10/13/2019   AKI (acute kidney injury) ([place]) 10/13/2019   Generalized weakness 10/13/2019   Left sided numbness    Fever    HCAP (healthcare-associated pneumonia)    Blood poisoning    Diabetic ketoacidosis without coma associated with diabetes mellitus due to underlying condition ([place])    SVT (supraventricular tachycardia) ([place])    HHNC (hyperglycemic hyperosmolar nonketotic coma) ([place])    Essential hypertension    Pain in the chest    Coronary artery disease with unspecified angina pectoris    HLD (hyperlipidemia)    Leukocytosis    Numbness and tingling in left hand    Noncompliance with medication regimen    Chest pain 08/26/2014   Paresthesia of left arm 08/26/2014   Hyperglycemia due to type 2 diabetes mellitus ([place]) 08/26/2014   DM (diabetes mellitus) ([place]) 01/25/2014   DKA (diabetic ketoacidoses) 01/09/2014   Type II or unspecified type diabetes mellitus with unspecified complication, uncontrolled 01/09/2014   Essential hypertension, benign 01/09/2014   CAD (coronary artery disease)  01/09/2014   Fever presenting with conditions classified elsewhere 01/09/2014   Leukocytosis, unspecified 01/09/2014   Elevated lactic acid level 01/09/2014    Past Surgical History:  Procedure Laterality Date   CARDIAC SURGERY     CORONARY ANGIOPLASTY WITH STENT PLACEMENT     CORONARY ARTERY BYPASS GRAFT     HIP ARTHROPLASTY Right        Family History  Problem Relation Age of Onset   Stroke Mother     Social History   Tobacco Use   Smoking status: Former    Packs/day: 2.00    Years: 30.00    Pack years: 60.00    Types: Cigarettes   Smokeless tobacco: Current  Vaping Use   Vaping Use: Never used  Substance Use Topics   Alcohol use: No   Drug use: No    Home Medications Prior to Admission medications   Medication Sig Start Date End Date Taking? Authorizing Provider  acetaminophen (TYLENOL) 500 MG tablet Take 2 tablets (1,000 mg total) by mouth every 6 (six) hours as needed. Patient taking differently: Take 1,000 mg by mouth every 6 (six) hours as needed for mild pain. 12/13/20  Yes Charlesetta Shanks, MD  aspirin EC 81 MG tablet Take 81 mg by mouth daily.   Yes [provider]  Blood Glucose Monitoring Suppl (TRUE METRIX METER) w/Device KIT Use as directed Patient taking differently: 1 each by Other route as directed.  01/02/19  Yes Kerin Perna, NP  clopidogrel (PLAVIX) 75 MG tablet Take 1 tablet (75 mg total) by mouth daily with breakfast. 10/20/19  Yes Bonnielee Haff, MD  fluticasone (FLOVENT HFA) 220 MCG/ACT inhaler Inhale 2 puffs into the lungs in the morning and at bedtime. 03/02/21  Yes [provider]  gabapentin (NEURONTIN) 300 MG capsule Take 300 mg by mouth daily. 03/02/21  Yes [provider]  GLIPIZIDE XL 10 MG 24 hr tablet Take 10 mg by mouth daily. 03/02/21  Yes [provider]  glucose blood (TRUE METRIX BLOOD GLUCOSE TEST) test strip Check blood sugar fasting and before meals and again if pt feels bad (symptoms of  hypo). Check sugar three times a day 10/10/19  Yes Upstill, Shari, PA-C  Insulin Isophane & Regular Human (NOVOLIN 70/30 FLEXPEN RELION) (70-30) 100 UNIT/ML PEN Inject 18 Units into the skin 2 (two) times daily. Patient taking differently: Inject 36 Units into the skin 2 (two) times daily. 10/20/19  Yes Bonnielee Haff, MD  Insulin Pen Needle (NOVOFINE) 30G X 8 MM MISC Inject 10 each into the skin as needed. Patient taking differently: Inject 1 packet into the skin as needed (insulin). 10/20/19  Yes Bonnielee Haff, MD  metoprolol tartrate (LOPRESSOR) 50 MG tablet Take 50 mg by mouth in the morning and at bedtime.   Yes [provider]  pantoprazole (PROTONIX) 40 MG tablet Take 1 tablet (40 mg total) by mouth 2 (two) times daily. 10/20/19  Yes Bonnielee Haff, MD  Semaglutide,0.25 or 0.5MG /DOS, 2 MG/1.5ML SOPN Inject 0.25 mg into the skin once a week. 03/02/21  Yes [provider]  VENTOLIN HFA 108 (90 Base) MCG/ACT inhaler Inhale 1-2 puffs into the lungs daily as needed for wheezing or shortness of breath. 03/02/21  Yes [provider]  metFORMIN (GLUCOPHAGE-XR) 500 MG 24 hr tablet Take 1 tablet (500 mg total) by mouth daily with breakfast. Patient not taking: No sig reported 10/20/19   Bonnielee Haff, MD  omega-3 acid ethyl esters (LOVAZA) 1 g capsule Take 2 capsules (2 g total) by mouth 2 (two) times daily. Patient not taking: Reported on 04/15/2021 01/05/19   Kerin Perna, NP  trimethoprim-polymyxin b (POLYTRIM) ophthalmic solution Place 1 drop into both eyes every 4 (four) hours. Patient not taking: No sig reported 02/21/21   Horton, Barbette Hair, MD    Allergies    Lisinopril, Lactose intolerance (gi), and Other  Review of Systems   Review of Systems  Constitutional:        Per HPI, otherwise negative  HENT:         Per HPI, otherwise negative  Respiratory:         Per HPI, otherwise negative  Cardiovascular:        Per HPI, otherwise negative  Gastrointestinal:   Negative for vomiting.  Endocrine:       Negative aside from HPI  Genitourinary:        Neg aside from HPI   Musculoskeletal:        Per HPI, otherwise negative  Skin:  Positive for wound.  Allergic/Immunologic: Negative for immunocompromised state.  Neurological:  Negative for syncope, weakness and numbness.   Physical Exam Updated Vital Signs BP (!) 153/109 (BP Location: Left Arm)   Pulse 94   Temp 98.7 F (37.1 C) (Oral)   Resp 16   Ht 6\' 4"  (1.93 m)   SpO2 (!) 68%   BMI 32.87 kg/m   Physical Exam Vitals and  nursing note reviewed.  Constitutional:      General: He is not in acute distress.    Appearance: He is well-developed.  HENT:     Head: Normocephalic and atraumatic.  Eyes:     Conjunctiva/sclera: Conjunctivae normal.  Cardiovascular:     Rate and Rhythm: Normal rate and regular rhythm.     Pulses: Normal pulses.  Pulmonary:     Effort: Pulmonary effort is normal. No respiratory distress.     Breath sounds: No stridor.  Abdominal:     General: There is no distension.  Musculoskeletal:     Comments: Beyond skin lesion left foot, ankle unremarkable  Skin:    General: Skin is warm and dry.       Neurological:     Mental Status: He is alert and oriented to person, place, and time.    ED Results / Procedures / Treatments   Labs (all labs ordered are listed, but only abnormal results are displayed) Labs Reviewed  BASIC METABOLIC PANEL - Abnormal; Notable for the following components:      Result Value   Sodium 133 (*)    CO2 21 (*)    Glucose, Bld 496 (*)    BUN 52 (*)    Creatinine, Ser 3.54 (*)    GFR, Estimated 20 (*)    All other components within normal limits  CBC WITH DIFFERENTIAL/PLATELET - Abnormal; Notable for the following components:   RBC 3.67 (*)    Hemoglobin 11.1 (*)    HCT 34.0 (*)    All other components within normal limits  CBG MONITORING, ED - Abnormal; Notable for the following components:   Glucose-Capillary 479 (*)    All  other components within normal limits    EKG None  Radiology DG Foot Complete Left  Result Date: 04/15/2021 CLINICAL DATA:  wounds. Wound of the heel of the LEFT foot started 2 weeks ago. Wound on the LEFT great toe. EXAM: LEFT FOOT - COMPLETE 3+ VIEW COMPARISON:  03/18/2021 FINDINGS: There is no evidence of fracture or dislocation. There is no evidence of arthropathy or other focal bone abnormality. Soft tissues are unremarkable. IMPRESSION: Negative. Electronically Signed   By: Nolon Nations M.D.   On: 04/15/2021 10:15    Procedures Procedures   Medications Ordered in ED Medications - No data to display  ED Course  I have reviewed the triage vital signs and the nursing notes.  Pertinent labs & imaging results that were available during my care of the patient were reviewed by me and considered in my medical decision making (see chart for details).  Adult male diabetes presents with concern for an open wound on his left foot.  Here, patient is awake and alert, hemodynamically unremarkable.  SPO2 value of 60% is likely spurious as he has no increased work of breathing, no respiratory complaints. Patient's labs consistent with multiple prior studies, CKD, no notable changes.  Patient encouraged to take his medicine, for better hyperglycemic control in this regard. Patient's x-ray reviewed, discussed, no evidence for osteo-, nor deep infection.  Patient discharged in stable condition after lengthy conversation on wound care with for fresh dressing applied here. Final Clinical Impression(s) / ED Diagnoses Final diagnoses:  Open wound of left heel, initial encounter     Carmin Muskrat, MD 04/15/21 1124

## 2021-05-07 ENCOUNTER — Encounter (HOSPITAL_COMMUNITY): Payer: Self-pay

## 2021-05-07 ENCOUNTER — Emergency Department (HOSPITAL_COMMUNITY)
Admission: EM | Admit: 2021-05-07 | Discharge: 2021-05-08 | Disposition: A | Discharge status: Home / Self Care | Payer: BLUE CROSS/BLUE SHIELD | Attending: Emergency Medicine | Admitting: Emergency Medicine

## 2021-05-07 ENCOUNTER — Other Ambulatory Visit: Payer: Self-pay

## 2021-05-07 DIAGNOSIS — M545 Low back pain, unspecified: Secondary | ICD-10-CM | POA: Diagnosis present

## 2021-05-07 DIAGNOSIS — E1122 Type 2 diabetes mellitus with diabetic chronic kidney disease: Secondary | ICD-10-CM | POA: Diagnosis not present

## 2021-05-07 DIAGNOSIS — M62830 Muscle spasm of back: Secondary | ICD-10-CM

## 2021-05-07 DIAGNOSIS — Z87891 Personal history of nicotine dependence: Secondary | ICD-10-CM | POA: Diagnosis not present

## 2021-05-07 DIAGNOSIS — I129 Hypertensive chronic kidney disease with stage 1 through stage 4 chronic kidney disease, or unspecified chronic kidney disease: Secondary | ICD-10-CM | POA: Insufficient documentation

## 2021-05-07 DIAGNOSIS — N189 Chronic kidney disease, unspecified: Secondary | ICD-10-CM | POA: Insufficient documentation

## 2021-05-07 NOTE — ED Triage Notes (Signed)
Pt complains of chronic back pain that has gotten worse over the past few days. Pain is in the right lower back.

## 2021-05-08 MED ORDER — CYCLOBENZAPRINE HCL 10 MG PO TABS: 10.0000 mg | ORAL_TABLET | Freq: Two times a day (BID) | ORAL | 0 refills | Status: DC | PRN

## 2021-05-08 MED ORDER — OXYCODONE-ACETAMINOPHEN 5-325 MG PO TABS: 1.0000 | ORAL_TABLET | ORAL | 0 refills | Status: DC | PRN

## 2021-05-08 NOTE — Discharge Instructions (Addendum)
Follow up with your doctor for recheck of back pain and muscle spasm. Rest the back, use warm compresses. Take medications as prescribed.   Return to the ED with any new or concerning symptoms.

## 2021-05-08 NOTE — ED Provider Notes (Signed)
[facility] DEPT Provider Note   CSN: [id number] Arrival date & time: 05/07/21  2123     History Chief Complaint  Patient presents with   Back Pain    Howard Mcmahon is a 50 y.o. male.  Patient to ED with acute on chronic low right back pain x 2-3 days. Worse with movement, still painful at rest but better. No bowel/bladder dysfunction. No fever. No recent injury. No sciatic component, numbness or weakness. He has been taking Tylenol without relief.   The history is provided by the patient. No language interpreter was used.  Back Pain Associated symptoms: no abdominal pain, no chest pain, no fever, no numbness and no weakness       Past Medical History:  Diagnosis Date   Chronic kidney disease    Diabetes mellitus without complication ([place])    Hypertension    Macular infarction    2012   MI (myocardial infarction) ([place])    Peripheral vascular disease ([place])    Neuropathy   Pneumonia     Patient Active Problem List   Diagnosis Date Noted   COVID-19 virus infection 10/13/2019   AKI (acute kidney injury) ([place]) 10/13/2019   Generalized weakness 10/13/2019   Left sided numbness    Fever    HCAP (healthcare-associated pneumonia)    Blood poisoning    Diabetic ketoacidosis without coma associated with diabetes mellitus due to underlying condition ([place])    SVT (supraventricular tachycardia) ([facility])    HHNC (hyperglycemic hyperosmolar nonketotic coma) ([place])    Essential hypertension    Pain in the chest    Coronary artery disease with unspecified angina pectoris    HLD (hyperlipidemia)    Leukocytosis    Numbness and tingling in left hand    Noncompliance with medication regimen    Chest pain 08/26/2014   Paresthesia of left arm 08/26/2014   Hyperglycemia due to type 2 diabetes mellitus ([place]) 08/26/2014   DM (diabetes mellitus) ([place]) 01/25/2014   DKA (diabetic ketoacidoses) 01/09/2014   Type II or unspecified type diabetes mellitus with  unspecified complication, uncontrolled 01/09/2014   Essential hypertension, benign 01/09/2014   CAD (coronary artery disease) 01/09/2014   Fever presenting with conditions classified elsewhere 01/09/2014   Leukocytosis, unspecified 01/09/2014   Elevated lactic acid level 01/09/2014    Past Surgical History:  Procedure Laterality Date   CARDIAC SURGERY     CORONARY ANGIOPLASTY WITH STENT PLACEMENT     CORONARY ARTERY BYPASS GRAFT     HIP ARTHROPLASTY Right        Family History  Problem Relation Age of Onset   Stroke Mother     Social History   Tobacco Use   Smoking status: Former    Packs/day: 2.00    Years: 30.00    Pack years: 60.00    Types: Cigarettes   Smokeless tobacco: Current  Vaping Use   Vaping Use: Never used  Substance Use Topics   Alcohol use: No   Drug use: No    Home Medications Prior to Admission medications   Medication Sig Start Date End Date Taking? Authorizing Provider  acetaminophen (TYLENOL) 500 MG tablet Take 2 tablets (1,000 mg total) by mouth every 6 (six) hours as needed. Patient taking differently: Take 1,000 mg by mouth every 6 (six) hours as needed for mild pain. 12/13/20   Charlesetta Shanks, MD  aspirin EC 81 MG tablet Take 81 mg by mouth daily.    [provider]  Blood Glucose  Monitoring Suppl (TRUE METRIX METER) w/Device KIT Use as directed Patient taking differently: 1 each by Other route as directed. 01/02/19   Kerin Perna, NP  clopidogrel (PLAVIX) 75 MG tablet Take 1 tablet (75 mg total) by mouth daily with breakfast. 10/20/19   Bonnielee Haff, MD  fluticasone (FLOVENT HFA) 220 MCG/ACT inhaler Inhale 2 puffs into the lungs in the morning and at bedtime. 03/02/21   [provider]  gabapentin (NEURONTIN) 300 MG capsule Take 300 mg by mouth daily. 03/02/21   [provider]  GLIPIZIDE XL 10 MG 24 hr tablet Take 10 mg by mouth daily. 03/02/21   [provider]  glucose blood (TRUE METRIX BLOOD  GLUCOSE TEST) test strip Check blood sugar fasting and before meals and again if pt feels bad (symptoms of hypo). Check sugar three times a day 10/10/19   Charlann Lange, PA-C  Insulin Isophane & Regular Human (NOVOLIN 70/30 FLEXPEN RELION) (70-30) 100 UNIT/ML PEN Inject 18 Units into the skin 2 (two) times daily. Patient taking differently: Inject 36 Units into the skin 2 (two) times daily. 10/20/19   Bonnielee Haff, MD  Insulin Pen Needle (NOVOFINE) 30G X 8 MM MISC Inject 10 each into the skin as needed. Patient taking differently: Inject 1 packet into the skin as needed (insulin). 10/20/19   Bonnielee Haff, MD  metFORMIN (GLUCOPHAGE-XR) 500 MG 24 hr tablet Take 1 tablet (500 mg total) by mouth daily with breakfast. Patient not taking: No sig reported 10/20/19   Bonnielee Haff, MD  metoprolol tartrate (LOPRESSOR) 50 MG tablet Take 50 mg by mouth in the morning and at bedtime.    [provider]  omega-3 acid ethyl esters (LOVAZA) 1 g capsule Take 2 capsules (2 g total) by mouth 2 (two) times daily. Patient not taking: Reported on 04/15/2021 01/05/19   Kerin Perna, NP  pantoprazole (PROTONIX) 40 MG tablet Take 1 tablet (40 mg total) by mouth 2 (two) times daily. 10/20/19   Bonnielee Haff, MD  Semaglutide,0.25 or 0.5MG /DOS, 2 MG/1.5ML SOPN Inject 0.25 mg into the skin once a week. 03/02/21   [provider]  trimethoprim-polymyxin b (POLYTRIM) ophthalmic solution Place 1 drop into both eyes every 4 (four) hours. Patient not taking: No sig reported 02/21/21   Horton, Barbette Hair, MD  VENTOLIN HFA 108 [phone] Base) MCG/ACT inhaler Inhale 1-2 puffs into the lungs daily as needed for wheezing or shortness of breath. 03/02/21   [provider]    Allergies    Lisinopril, Lactose intolerance (gi), and Other  Review of Systems   Review of Systems  Constitutional:  Negative for fever.  Respiratory:  Negative for shortness of breath.   Cardiovascular:  Negative for chest pain and  leg swelling.  Gastrointestinal:  Negative for abdominal pain.  Genitourinary:        No incontinence  Musculoskeletal:  Positive for back pain.  Skin:  Negative for color change.  Neurological:  Negative for weakness and numbness.   Physical Exam Updated Vital Signs BP (!) 163/96 (BP Location: Right Arm)   Pulse (!) 104   Temp 98.4 F (36.9 C) (Oral)   Resp 18   Ht 6\' 4"  (1.93 m)   Wt 122.5 kg   SpO2 94%   BMI 32.87 kg/m   Physical Exam Vitals and nursing note reviewed.  Constitutional:      General: He is not in acute distress.    Appearance: Normal appearance. He is not ill-appearing.  Cardiovascular:  Rate and Rhythm: Normal rate.  Pulmonary:     Effort: Pulmonary effort is normal.  Abdominal:     Palpations: Abdomen is soft.     Tenderness: There is no abdominal tenderness.  Musculoskeletal:        General: Normal range of motion.       Back:     Comments: Tender to right lower back along the paralumbar area with palpable spasm.   Neurological:     Mental Status: He is alert.    ED Results / Procedures / Treatments   Labs (all labs ordered are listed, but only abnormal results are displayed) Labs Reviewed - No data to display  EKG None  Radiology No results found.  Procedures Procedures   Medications Ordered in ED Medications - No data to display  ED Course  I have reviewed the triage vital signs and the nursing notes.  Pertinent labs & imaging results that were available during my care of the patient were reviewed by me and considered in my medical decision making (see chart for details).    MDM Rules/Calculators/A&P                           Patient to ED with acute flare of chronic/recurrent back pain. No known injury.   No neurologic deficits. There is a palpable muscular spasm along the right paralumbar spine.   Patient reports he is driving and would like prescriptions only.  Score of 000 on database review. Rx Percocet,  Flexeril.  Final Clinical Impression(s) / ED Diagnoses Final diagnoses:  None   Muscle spasm Back pain  Rx / DC Orders ED Discharge Orders     None        Dennie Bible 05/08/21 0135    Palumbo, April, MD 05/08/21 0211

## 2021-05-21 ENCOUNTER — Other Ambulatory Visit: Payer: Self-pay

## 2021-05-21 ENCOUNTER — Emergency Department (HOSPITAL_BASED_OUTPATIENT_CLINIC_OR_DEPARTMENT_OTHER): Payer: BLUE CROSS/BLUE SHIELD | Admitting: Radiology

## 2021-05-21 ENCOUNTER — Emergency Department (HOSPITAL_BASED_OUTPATIENT_CLINIC_OR_DEPARTMENT_OTHER)
Admission: EM | Admit: 2021-05-21 | Discharge: 2021-05-21 | Disposition: A | Discharge status: Home / Self Care | Payer: BLUE CROSS/BLUE SHIELD | Attending: Emergency Medicine | Admitting: Emergency Medicine

## 2021-05-21 ENCOUNTER — Encounter (HOSPITAL_BASED_OUTPATIENT_CLINIC_OR_DEPARTMENT_OTHER): Payer: Self-pay | Admitting: Emergency Medicine

## 2021-05-21 DIAGNOSIS — Z951 Presence of aortocoronary bypass graft: Secondary | ICD-10-CM | POA: Diagnosis not present

## 2021-05-21 DIAGNOSIS — Z20822 Contact with and (suspected) exposure to covid-19: Secondary | ICD-10-CM | POA: Insufficient documentation

## 2021-05-21 DIAGNOSIS — Z87891 Personal history of nicotine dependence: Secondary | ICD-10-CM | POA: Insufficient documentation

## 2021-05-21 DIAGNOSIS — Z7982 Long term (current) use of aspirin: Secondary | ICD-10-CM | POA: Insufficient documentation

## 2021-05-21 DIAGNOSIS — Z8616 Personal history of COVID-19: Secondary | ICD-10-CM | POA: Diagnosis not present

## 2021-05-21 DIAGNOSIS — N189 Chronic kidney disease, unspecified: Secondary | ICD-10-CM | POA: Diagnosis not present

## 2021-05-21 DIAGNOSIS — Z043 Encounter for examination and observation following other accident: Secondary | ICD-10-CM | POA: Diagnosis present

## 2021-05-21 DIAGNOSIS — W19XXXA Unspecified fall, initial encounter: Secondary | ICD-10-CM | POA: Diagnosis not present

## 2021-05-21 DIAGNOSIS — M62830 Muscle spasm of back: Secondary | ICD-10-CM | POA: Insufficient documentation

## 2021-05-21 DIAGNOSIS — Z794 Long term (current) use of insulin: Secondary | ICD-10-CM | POA: Insufficient documentation

## 2021-05-21 DIAGNOSIS — Z7902 Long term (current) use of antithrombotics/antiplatelets: Secondary | ICD-10-CM | POA: Diagnosis not present

## 2021-05-21 DIAGNOSIS — I2510 Atherosclerotic heart disease of native coronary artery without angina pectoris: Secondary | ICD-10-CM | POA: Diagnosis not present

## 2021-05-21 DIAGNOSIS — E1122 Type 2 diabetes mellitus with diabetic chronic kidney disease: Secondary | ICD-10-CM | POA: Insufficient documentation

## 2021-05-21 DIAGNOSIS — Y92000 Kitchen of unspecified non-institutional (private) residence as  the place of occurrence of the external cause: Secondary | ICD-10-CM | POA: Diagnosis not present

## 2021-05-21 DIAGNOSIS — I129 Hypertensive chronic kidney disease with stage 1 through stage 4 chronic kidney disease, or unspecified chronic kidney disease: Secondary | ICD-10-CM | POA: Diagnosis not present

## 2021-05-21 LAB — RESP PANEL BY RT-PCR (FLU A&B, COVID) ARPGX2
Influenza A by PCR: NEGATIVE
Influenza B by PCR: NEGATIVE
SARS Coronavirus 2 by RT PCR: NEGATIVE

## 2021-05-21 MED ORDER — OXYCODONE-ACETAMINOPHEN 5-325 MG PO TABS: 1.0000 | ORAL_TABLET | ORAL | 0 refills | Status: DC | PRN

## 2021-05-21 MED ORDER — CYCLOBENZAPRINE HCL 10 MG PO TABS: 10.0000 mg | ORAL_TABLET | Freq: Two times a day (BID) | ORAL | 0 refills | Status: DC | PRN

## 2021-05-21 MED ORDER — LIDOCAINE 5 % EX PTCH: 1.0000 | MEDICATED_PATCH | CUTANEOUS | 0 refills | Status: DC

## 2021-05-21 NOTE — ED Provider Notes (Signed)
[facility] EMERGENCY DEPT Provider Note   CSN: [id number] Arrival date & time: 05/21/21  1656     History Chief Complaint  Patient presents with   Fall    Howard Mcmahon is a 50 y.o. male.  The history is provided by the patient and medical records. No language interpreter was used.  Fall This is a new problem. The current episode started more than 2 days ago. The problem occurs rarely. The problem has not changed since onset.Pertinent negatives include no chest pain, no abdominal pain, no headaches and no shortness of breath. The symptoms are aggravated by twisting. Nothing relieves the symptoms. He has tried nothing for the symptoms. The treatment provided no relief.      Past Medical History:  Diagnosis Date   Chronic kidney disease    Diabetes mellitus without complication ([place])    Hypertension    Macular infarction    2012   MI (myocardial infarction) ([place])    Peripheral vascular disease ([place])    Neuropathy   Pneumonia     Patient Active Problem List   Diagnosis Date Noted   COVID-19 virus infection 10/13/2019   AKI (acute kidney injury) ([place]) 10/13/2019   Generalized weakness 10/13/2019   Left sided numbness    Fever    HCAP (healthcare-associated pneumonia)    Blood poisoning    Diabetic ketoacidosis without coma associated with diabetes mellitus due to underlying condition ([place])    SVT (supraventricular tachycardia) ([place])    HHNC (hyperglycemic hyperosmolar nonketotic coma) ([place])    Essential hypertension    Pain in the chest    Coronary artery disease with unspecified angina pectoris    HLD (hyperlipidemia)    Leukocytosis    Numbness and tingling in left hand    Noncompliance with medication regimen    Chest pain 08/26/2014   Paresthesia of left arm 08/26/2014   Hyperglycemia due to type 2 diabetes mellitus ([place]) 08/26/2014   DM (diabetes mellitus) ([place]) 01/25/2014   DKA (diabetic ketoacidoses) 01/09/2014   Type II or unspecified type  diabetes mellitus with unspecified complication, uncontrolled 01/09/2014   Essential hypertension, benign 01/09/2014   CAD (coronary artery disease) 01/09/2014   Fever presenting with conditions classified elsewhere 01/09/2014   Leukocytosis, unspecified 01/09/2014   Elevated lactic acid level 01/09/2014    Past Surgical History:  Procedure Laterality Date   CARDIAC SURGERY     CORONARY ANGIOPLASTY WITH STENT PLACEMENT     CORONARY ARTERY BYPASS GRAFT     HIP ARTHROPLASTY Right        Family History  Problem Relation Age of Onset   Stroke Mother     Social History   Tobacco Use   Smoking status: Former    Packs/day: 2.00    Years: 30.00    Pack years: 60.00    Types: Cigarettes   Smokeless tobacco: Current  Vaping Use   Vaping Use: Never used  Substance Use Topics   Alcohol use: No   Drug use: No    Home Medications Prior to Admission medications   Medication Sig Start Date End Date Taking? Authorizing Provider  acetaminophen (TYLENOL) 500 MG tablet Take 2 tablets (1,000 mg total) by mouth every 6 (six) hours as needed. Patient taking differently: Take 1,000 mg by mouth every 6 (six) hours as needed for mild pain. 12/13/20   Charlesetta Shanks, MD  aspirin EC 81 MG tablet Take 81 mg by mouth daily.    [provider]  Blood Glucose  Monitoring Suppl (TRUE METRIX METER) w/Device KIT Use as directed Patient taking differently: 1 each by Other route as directed. 01/02/19   Kerin Perna, NP  clopidogrel (PLAVIX) 75 MG tablet Take 1 tablet (75 mg total) by mouth daily with breakfast. 10/20/19   Bonnielee Haff, MD  cyclobenzaprine (FLEXERIL) 10 MG tablet Take 1 tablet (10 mg total) by mouth 2 (two) times daily as needed for muscle spasms. 05/08/21   Charlann Lange, PA-C  fluticasone (FLOVENT HFA) 220 MCG/ACT inhaler Inhale 2 puffs into the lungs in the morning and at bedtime. 03/02/21   [provider]  gabapentin (NEURONTIN) 300 MG capsule Take 300 mg by  mouth daily. 03/02/21   [provider]  GLIPIZIDE XL 10 MG 24 hr tablet Take 10 mg by mouth daily. 03/02/21   [provider]  glucose blood (TRUE METRIX BLOOD GLUCOSE TEST) test strip Check blood sugar fasting and before meals and again if pt feels bad (symptoms of hypo). Check sugar three times a day 10/10/19   Charlann Lange, PA-C  Insulin Isophane & Regular Human (NOVOLIN 70/30 FLEXPEN RELION) (70-30) 100 UNIT/ML PEN Inject 18 Units into the skin 2 (two) times daily. Patient taking differently: Inject 36 Units into the skin 2 (two) times daily. 10/20/19   Bonnielee Haff, MD  Insulin Pen Needle (NOVOFINE) 30G X 8 MM MISC Inject 10 each into the skin as needed. Patient taking differently: Inject 1 packet into the skin as needed (insulin). 10/20/19   Bonnielee Haff, MD  metFORMIN (GLUCOPHAGE-XR) 500 MG 24 hr tablet Take 1 tablet (500 mg total) by mouth daily with breakfast. Patient not taking: No sig reported 10/20/19   Bonnielee Haff, MD  metoprolol tartrate (LOPRESSOR) 50 MG tablet Take 50 mg by mouth in the morning and at bedtime.    [provider]  omega-3 acid ethyl esters (LOVAZA) 1 g capsule Take 2 capsules (2 g total) by mouth 2 (two) times daily. Patient not taking: Reported on 04/15/2021 01/05/19   Kerin Perna, NP  oxyCODONE-acetaminophen (PERCOCET/ROXICET) 5-325 MG tablet Take 1 tablet by mouth every 4 (four) hours as needed for severe pain. 05/08/21   Charlann Lange, PA-C  pantoprazole (PROTONIX) 40 MG tablet Take 1 tablet (40 mg total) by mouth 2 (two) times daily. 10/20/19   Bonnielee Haff, MD  Semaglutide,0.25 or 0.5MG /DOS, 2 MG/1.5ML SOPN Inject 0.25 mg into the skin once a week. 03/02/21   [provider]  trimethoprim-polymyxin b (POLYTRIM) ophthalmic solution Place 1 drop into both eyes every 4 (four) hours. Patient not taking: No sig reported 02/21/21   Horton, Barbette Hair, MD  VENTOLIN HFA 108 [phone] Base) MCG/ACT inhaler Inhale 1-2 puffs into the  lungs daily as needed for wheezing or shortness of breath. 03/02/21   [provider]    Allergies    Lisinopril, Lactose intolerance (gi), and Other  Review of Systems   Review of Systems  Constitutional:  Negative for chills, diaphoresis, fatigue and fever.  HENT:  Negative for congestion.   Eyes:  Negative for visual disturbance.  Respiratory:  Negative for cough, chest tightness and shortness of breath.   Cardiovascular:  Negative for chest pain.  Gastrointestinal:  Negative for abdominal pain, constipation, diarrhea, nausea and vomiting.  Genitourinary:  Negative for flank pain and frequency.  Musculoskeletal:  Positive for back pain. Negative for neck pain and neck stiffness.  Skin:  Negative for rash and wound.  Neurological:  Positive for numbness (at his baseline). Negative for  weakness and headaches.  Hematological:  Negative for adenopathy.  Psychiatric/Behavioral:  Negative for agitation and confusion.   All other systems reviewed and are negative.  Physical Exam Updated Vital Signs BP (!) 151/91 (BP Location: Right Arm)   Pulse 92   Temp 98.7 F (37.1 C) (Oral)   Resp 16   Ht $R'6\' 4"'CT$  (1.93 m)   Wt 122.9 kg   SpO2 96%   BMI 32.99 kg/m   Physical Exam Vitals and nursing note reviewed.  Constitutional:      General: He is not in acute distress.    Appearance: He is well-developed. He is not ill-appearing, toxic-appearing or diaphoretic.  HENT:     Head: Normocephalic and atraumatic.     Nose: Nose normal.     Mouth/Throat:     Mouth: Mucous membranes are moist.  Eyes:     Conjunctiva/sclera: Conjunctivae normal.     Pupils: Pupils are equal, round, and reactive to light.  Cardiovascular:     Rate and Rhythm: Normal rate and regular rhythm.     Heart sounds: No murmur heard. Pulmonary:     Effort: Pulmonary effort is normal. No respiratory distress.     Breath sounds: Normal breath sounds. No wheezing, rhonchi or rales.  Chest:     Chest wall: No  tenderness.  Abdominal:     General: Abdomen is flat.     Palpations: Abdomen is soft.     Tenderness: There is no abdominal tenderness. There is no right CVA tenderness, left CVA tenderness, guarding or rebound.  Musculoskeletal:        General: Tenderness present.     Cervical back: Neck supple.     Thoracic back: Spasms and tenderness present. No bony tenderness.     Lumbar back: Spasms and tenderness present. No bony tenderness.       Back:  Skin:    General: Skin is warm and dry.     Capillary Refill: Capillary refill takes less than 2 seconds.     Findings: No erythema or rash.  Neurological:     General: No focal deficit present.     Mental Status: He is alert. Mental status is at baseline.     Cranial Nerves: No facial asymmetry.     Sensory: Sensory deficit (at baseline bilateral leg numbness) present.     Motor: No weakness or abnormal muscle tone.     Comments: Symmetric strength in legs.  Symmetric numbness in the legs he reports is unchanged from baseline.  No weakness in arms and exam otherwise unremarkable.  Psychiatric:        Mood and Affect: Mood normal.    ED Results / Procedures / Treatments   Labs (all labs ordered are listed, but only abnormal results are displayed) Labs Reviewed  RESP PANEL BY RT-PCR (FLU A&B, COVID) [id number]    EKG None  Radiology DG Thoracic Spine 2 View  Result Date: 05/21/2021 CLINICAL DATA:  Back pain after fall EXAM: THORACIC SPINE 2 VIEWS COMPARISON:  None. FINDINGS: Thoracic alignment within normal limits. Vertebral body heights are maintained. There are degenerative osteophytes. IMPRESSION: No acute osseous abnormality Electronically Signed   By: Donavan Foil M.D.   On: 05/21/2021 19:47   DG Lumbar Spine Complete  Result Date: 05/21/2021 CLINICAL DATA:  Back pain after fall EXAM: LUMBAR SPINE - COMPLETE 4+ VIEW COMPARISON:  None. FINDINGS: Lumbar alignment within normal limits. Vertebral body heights are maintained. The disc  spaces appear patent. IMPRESSION: No  acute osseous abnormality Electronically Signed   By: Donavan Foil M.D.   On: 05/21/2021 19:46    Procedures Procedures   Medications Ordered in ED Medications - No data to display  ED Course  I have reviewed the triage vital signs and the nursing notes.  Pertinent labs & imaging results that were available during my care of the patient were reviewed by me and considered in my medical decision making (see chart for details).    MDM Rules/Calculators/A&P                           Fatih Teichert is a 50 y.o. male with a past medical history significant for CAD with PCI and CABG , hypertension, CKD, and aspirin/Plavix use who presents with fall.  According to patient, 1 week ago he fell in the kitchen and has had severe pain in his back since.  He reports the pain is in the mid and low back primarily in the bilateral paraspinal areas but worse on the right side.  He does report that he has had some right-sided chronic back pain but this feels worse after the fall.  He denies any new numbness, tingling, or weakness of extremities.  Denies any new incontinence of bowel or bladder.  He reports he has some chronic troubles with his legs but nothing is new or different.  Denies any headache or head injury.  Denies any neck pain.  Denies any anterior torso symptoms with no chest pain or abdominal pain.  Denies fevers, chills, or infectious symptoms.  He does report his mother was diagnosed with COVID this morning who he spends a significant time with and wants to be checked for COVID as well.  On exam, patient does have palpable muscle spasms and tenderness in the paraspinal mid and low back.  No significant midline tenderness, more paraspinal tenderness.  Patient can move both legs and had sensation in both legs.  Abdomen and chest nontender.  No rash seen.  Lungs clear.  Patient will have x-ray of the mid and low back.  We discussed x-ray being ordered initially and  if there are significant fracture seen, will get CT scan but given his lack of significant midline tenderness, we will start with x-ray.  We will also tested for COVID.  Anticipate reassessment and disposition after imaging and COVID test.  8:07 PM COVID and flu test negative.  X-ray showed no evidence of fracture or dislocation.  As the regimen he received last time helped, will give the similar prescription of pain medicine, muscle relaxant, and will add Lidoderm patches given the focal spasms and discomfort.  He will follow-up with a PCP and understood return precautions.  Patient discharged in good condition.    Final Clinical Impression(s) / ED Diagnoses Final diagnoses:  Fall, initial encounter  Back muscle spasm  Close exposure to COVID-19 virus    Rx / DC Orders ED Discharge Orders          Ordered    cyclobenzaprine (FLEXERIL) 10 MG tablet  2 times daily PRN        05/21/21 2010    oxyCODONE-acetaminophen (PERCOCET/ROXICET) 5-325 MG tablet  Every 4 hours PRN        05/21/21 2010    lidocaine (LIDODERM) 5 %  Every 24 hours        05/21/21 2010           Clinical Impression: 1. Fall, initial encounter  2. Back muscle spasm   3. Close exposure to COVID-19 virus     Disposition: Discharge  Condition: Good  I have discussed the results, Dx and Tx plan with the pt(& family if present). He/she/they expressed understanding and agree(s) with the plan. Discharge instructions discussed at great length. Strict return precautions discussed and pt &/or family have verbalized understanding of the instructions. No further questions at time of discharge.    New Prescriptions   CYCLOBENZAPRINE (FLEXERIL) 10 MG TABLET    Take 1 tablet (10 mg total) by mouth 2 (two) times daily as needed for muscle spasms.   LIDOCAINE (LIDODERM) 5 %    Place 1 patch onto the skin daily. Remove & Discard patch within 12 hours or as directed by MD   OXYCODONE-ACETAMINOPHEN (PERCOCET/ROXICET) 5-325  MG TABLET    Take 1 tablet by mouth every 4 (four) hours as needed for severe pain.    Follow Up: No follow-up provider specified.    Loveah Like, Gwenyth Allegra, MD 05/21/21 2011

## 2021-05-21 NOTE — ED Triage Notes (Signed)
Pt presents to ED POV. Pt c/o thoracic back pain after fall. Pt reports mechanical, no LOC, no head injury. +blood thinners.

## 2021-05-21 NOTE — Discharge Instructions (Signed)
Your history, exam, work-up today did not show evidence of acute fracture and your COVID test was negative.  I suspect you have worsened and exacerbated muscle spasms and chronic back pain.  Please use the pain medicine that worked last time and use the numbing patches as well.  Please follow-up with your back doctor and PCP.  If any symptoms change or worsen, please return to the nearest emergency department.

## 2021-07-14 ENCOUNTER — Ambulatory Visit (HOSPITAL_COMMUNITY)
Admission: EM | Admit: 2021-07-14 | Discharge: 2021-07-14 | Disposition: A | Discharge status: Home / Self Care | Payer: BLUE CROSS/BLUE SHIELD

## 2021-07-14 DIAGNOSIS — R45851 Suicidal ideations: Secondary | ICD-10-CM

## 2021-07-14 NOTE — Progress Notes (Signed)
Howard Mcmahon received his AVS, questions answered and he was escorted to the lobby and called his mother to pick him up.
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
## 2021-07-14 NOTE — Discharge Instructions (Addendum)
Please contact one of the following facilities to start medication management and therapy services:   [facility] [place] [address]  [place], [place] [address] [phone]   [facility]  [address] [place], [place] [address] [phone]  [facility]  [address] [place], [place] [address] [phone]  [facility]  [address]  [place], [place] [address] [phone]  [facility]  [place], [place] [address] [phone]  [facility]  [address],  [place],  [address] [phone].  Discharge recommendations:  Patient is to take medications as prescribed. Please see information for follow-up appointment with psychiatry and therapy. Please follow up with your primary care provider for all medical related needs.   Therapy: We recommend that patient participate in individual therapy to address mental health concerns.  Safety:  The patient should abstain from use of illicit substances/drugs and abuse of any medications. If symptoms worsen or do not continue to improve or if the patient becomes actively suicidal or homicidal then it is recommended that the patient return to the closest hospital emergency department, the [facility], or call 911 for further evaluation and treatment. National Suicide Prevention Lifeline 1-800-SUICIDE or 9097816471.  About 988 988 offers 24/7 access to trained crisis counselors who can help people experiencing mental health-related distress. People can call or text 988 or chat 988lifeline.org for themselves or if they are worried about a loved one who may need crisis support.

## 2021-07-17 ENCOUNTER — Telehealth (HOSPITAL_COMMUNITY): Payer: Self-pay | Admitting: Emergency Medicine

## 2021-07-17 NOTE — BH Assessment (Signed)
Care Management - Follow Up [facility] Discharges   Writer attempted to make contact with patient today and was unsuccessful.  Writer left a HIPPA compliant voice message.   Per chart review, patient was provided with outpatient resources.

## 2021-08-14 ENCOUNTER — Emergency Department (HOSPITAL_BASED_OUTPATIENT_CLINIC_OR_DEPARTMENT_OTHER): Payer: BLUE CROSS/BLUE SHIELD | Admitting: Radiology

## 2021-08-14 ENCOUNTER — Emergency Department (HOSPITAL_BASED_OUTPATIENT_CLINIC_OR_DEPARTMENT_OTHER): Payer: BLUE CROSS/BLUE SHIELD

## 2021-08-14 ENCOUNTER — Other Ambulatory Visit: Payer: Self-pay

## 2021-08-14 ENCOUNTER — Emergency Department (HOSPITAL_BASED_OUTPATIENT_CLINIC_OR_DEPARTMENT_OTHER)
Admission: EM | Admit: 2021-08-14 | Discharge: 2021-08-14 | Disposition: A | Discharge status: Home / Self Care | Payer: BLUE CROSS/BLUE SHIELD | Attending: Emergency Medicine | Admitting: Emergency Medicine

## 2021-08-14 ENCOUNTER — Encounter (HOSPITAL_BASED_OUTPATIENT_CLINIC_OR_DEPARTMENT_OTHER): Payer: Self-pay

## 2021-08-14 DIAGNOSIS — Z7984 Long term (current) use of oral hypoglycemic drugs: Secondary | ICD-10-CM | POA: Insufficient documentation

## 2021-08-14 DIAGNOSIS — Z87891 Personal history of nicotine dependence: Secondary | ICD-10-CM | POA: Diagnosis not present

## 2021-08-14 DIAGNOSIS — Z79899 Other long term (current) drug therapy: Secondary | ICD-10-CM | POA: Insufficient documentation

## 2021-08-14 DIAGNOSIS — E1122 Type 2 diabetes mellitus with diabetic chronic kidney disease: Secondary | ICD-10-CM | POA: Diagnosis not present

## 2021-08-14 DIAGNOSIS — M549 Dorsalgia, unspecified: Secondary | ICD-10-CM | POA: Diagnosis not present

## 2021-08-14 DIAGNOSIS — Z96641 Presence of right artificial hip joint: Secondary | ICD-10-CM | POA: Diagnosis not present

## 2021-08-14 DIAGNOSIS — Z7982 Long term (current) use of aspirin: Secondary | ICD-10-CM | POA: Insufficient documentation

## 2021-08-14 DIAGNOSIS — M79605 Pain in left leg: Secondary | ICD-10-CM

## 2021-08-14 DIAGNOSIS — N189 Chronic kidney disease, unspecified: Secondary | ICD-10-CM | POA: Diagnosis not present

## 2021-08-14 DIAGNOSIS — Z7902 Long term (current) use of antithrombotics/antiplatelets: Secondary | ICD-10-CM | POA: Diagnosis not present

## 2021-08-14 DIAGNOSIS — Z794 Long term (current) use of insulin: Secondary | ICD-10-CM | POA: Diagnosis not present

## 2021-08-14 DIAGNOSIS — Z951 Presence of aortocoronary bypass graft: Secondary | ICD-10-CM | POA: Diagnosis not present

## 2021-08-14 DIAGNOSIS — M7989 Other specified soft tissue disorders: Secondary | ICD-10-CM | POA: Diagnosis present

## 2021-08-14 DIAGNOSIS — I129 Hypertensive chronic kidney disease with stage 1 through stage 4 chronic kidney disease, or unspecified chronic kidney disease: Secondary | ICD-10-CM | POA: Insufficient documentation

## 2021-08-14 DIAGNOSIS — I2510 Atherosclerotic heart disease of native coronary artery without angina pectoris: Secondary | ICD-10-CM | POA: Insufficient documentation

## 2021-08-14 DIAGNOSIS — Z8616 Personal history of COVID-19: Secondary | ICD-10-CM | POA: Insufficient documentation

## 2021-08-14 LAB — CBC WITH DIFFERENTIAL/PLATELET
Abs Immature Granulocytes: 0.06 10*3/uL (ref 0.00–0.07)
Basophils Absolute: 0 10*3/uL (ref 0.0–0.1)
Basophils Relative: 0 %
Eosinophils Absolute: 0.3 10*3/uL (ref 0.0–0.5)
Eosinophils Relative: 2 %
HCT: 30.1 % — ABNORMAL LOW (ref 39.0–52.0)
Hemoglobin: 9.5 g/dL — ABNORMAL LOW (ref 13.0–17.0)
Immature Granulocytes: 1 %
Lymphocytes Relative: 25 %
Lymphs Abs: 3 10*3/uL (ref 0.7–4.0)
MCH: 29.1 pg (ref 26.0–34.0)
MCHC: 31.6 g/dL (ref 30.0–36.0)
MCV: 92.3 fL (ref 80.0–100.0)
Monocytes Absolute: 0.9 10*3/uL (ref 0.1–1.0)
Monocytes Relative: 7 %
Neutro Abs: 7.8 10*3/uL — ABNORMAL HIGH (ref 1.7–7.7)
Neutrophils Relative %: 65 %
Platelets: 229 10*3/uL (ref 150–400)
RBC: 3.26 MIL/uL — ABNORMAL LOW (ref 4.22–5.81)
RDW: 12.6 % (ref 11.5–15.5)
WBC: 12.1 10*3/uL — ABNORMAL HIGH (ref 4.0–10.5)
nRBC: 0 % (ref 0.0–0.2)

## 2021-08-14 LAB — COMPREHENSIVE METABOLIC PANEL
ALT: 9 U/L (ref 0–44)
AST: 10 U/L — ABNORMAL LOW (ref 15–41)
Albumin: 3.7 g/dL (ref 3.5–5.0)
Alkaline Phosphatase: 102 U/L (ref 38–126)
Anion gap: 10 (ref 5–15)
BUN: 37 mg/dL — ABNORMAL HIGH (ref 6–20)
CO2: 20 mmol/L — ABNORMAL LOW (ref 22–32)
Calcium: 9.8 mg/dL (ref 8.9–10.3)
Chloride: 110 mmol/L (ref 98–111)
Creatinine, Ser: 2.37 mg/dL — ABNORMAL HIGH (ref 0.61–1.24)
GFR, Estimated: 33 mL/min — ABNORMAL LOW (ref >60)
Glucose, Bld: 89 mg/dL (ref 70–99)
Potassium: 4.1 mmol/L (ref 3.5–5.1)
Sodium: 140 mmol/L (ref 135–145)
Total Bilirubin: 0.5 mg/dL (ref 0.3–1.2)
Total Protein: 8.3 g/dL — ABNORMAL HIGH (ref 6.5–8.1)

## 2021-08-14 LAB — SEDIMENTATION RATE: Sed Rate: 132 mm/hr — ABNORMAL HIGH (ref 0–16)

## 2021-08-14 MED ORDER — CYCLOBENZAPRINE HCL 10 MG PO TABS: 10.0000 mg | ORAL_TABLET | Freq: Two times a day (BID) | ORAL | 0 refills | Status: DC | PRN

## 2021-08-14 MED ORDER — ACETAMINOPHEN 325 MG PO TABS: 650.0000 mg | ORAL_TABLET | Freq: Four times a day (QID) | ORAL | 0 refills | Status: AC | PRN

## 2021-08-14 MED ORDER — CYCLOBENZAPRINE HCL 5 MG PO TABS
5.0000 mg | ORAL_TABLET | Freq: Once | ORAL | Status: AC
  Administered 2021-08-14: 5 mg via ORAL
  Filled 2021-08-14: qty 1

## 2021-08-14 MED ORDER — LACTATED RINGERS IV BOLUS
1000.0000 mL | Freq: Once | INTRAVENOUS | Status: AC
  Administered 2021-08-14: 1000 mL via INTRAVENOUS

## 2021-08-14 NOTE — ED Provider Notes (Signed)
[facility] EMERGENCY DEPT Provider Note   CSN: [id number] Arrival date & time: 08/14/21  1110     History Chief Complaint  Patient presents with   Back Pain   Edema    Howard Mcmahon is a 50 y.o. male with history of chronic kidney disease and diabetes mellitus and coronary disease status post CABG who presents the emergency department with back pain and left leg pain that has been ongoing for the last 2 weeks.  With regards to his back pain, he denies any injury, trauma, or abnormal lifting.  He denies any weakness to his lower extremities, numbness to his lower extremities, loss of bowel, loss of bladder, fever, chills.    With regards to his left leg pain and swelling, patient recently got a cast removed secondary to diabetic ulcer on the left heel.  Been complaining of leg swelling which she rates moderate severity.  Has not taken anything for his back or leg pain.   Back Pain     Past Medical History:  Diagnosis Date   Chronic kidney disease    Diabetes mellitus without complication ([place])    Hypertension    Macular infarction    2012   MI (myocardial infarction) ([place])    Peripheral vascular disease ([place])    Neuropathy   Pneumonia     Patient Active Problem List   Diagnosis Date Noted   COVID-19 virus infection 10/13/2019   AKI (acute kidney injury) ([place]) 10/13/2019   Generalized weakness 10/13/2019   Left sided numbness    Fever    HCAP (healthcare-associated pneumonia)    Blood poisoning    Diabetic ketoacidosis without coma associated with diabetes mellitus due to underlying condition ([place])    SVT (supraventricular tachycardia) ([place])    HHNC (hyperglycemic hyperosmolar nonketotic coma) ([place])    Essential hypertension    Pain in the chest    Coronary artery disease with unspecified angina pectoris    HLD (hyperlipidemia)    Leukocytosis    Numbness and tingling in left hand    Noncompliance with medication regimen    Chest pain 08/26/2014    Paresthesia of left arm 08/26/2014   Hyperglycemia due to type 2 diabetes mellitus ([place]) 08/26/2014   DM (diabetes mellitus) ([place]) 01/25/2014   DKA (diabetic ketoacidoses) 01/09/2014   Type II or unspecified type diabetes mellitus with unspecified complication, uncontrolled 01/09/2014   Essential hypertension, benign 01/09/2014   CAD (coronary artery disease) 01/09/2014   Fever presenting with conditions classified elsewhere 01/09/2014   Leukocytosis, unspecified 01/09/2014   Elevated lactic acid level 01/09/2014    Past Surgical History:  Procedure Laterality Date   CARDIAC SURGERY     CORONARY ANGIOPLASTY WITH STENT PLACEMENT     CORONARY ARTERY BYPASS GRAFT     HIP ARTHROPLASTY Right        Family History  Problem Relation Age of Onset   Stroke Mother     Social History   Tobacco Use   Smoking status: Former    Packs/day: 2.00    Years: 30.00    Pack years: 60.00    Types: Cigarettes   Smokeless tobacco: Current  Vaping Use   Vaping Use: Never used  Substance Use Topics   Alcohol use: No   Drug use: No    Home Medications Prior to Admission medications   Medication Sig Start Date End Date Taking? Authorizing Provider  acetaminophen (TYLENOL) 325 MG tablet Take 2 tablets (650 mg total) by mouth every 6 (  six) hours as needed. 08/14/21  Yes Raul Del, Estiben Mizuno M, PA-C  cyclobenzaprine (FLEXERIL) 10 MG tablet Take 1 tablet (10 mg total) by mouth 2 (two) times daily as needed for muscle spasms. 08/14/21  Yes Myna Bright M, PA-C  aspirin EC 81 MG tablet Take 81 mg by mouth daily.    [provider]  Blood Glucose Monitoring Suppl (TRUE METRIX METER) w/Device KIT Use as directed Patient taking differently: 1 each by Other route as directed. 01/02/19   Kerin Perna, NP  clopidogrel (PLAVIX) 75 MG tablet Take 1 tablet (75 mg total) by mouth daily with breakfast. 10/20/19   Bonnielee Haff, MD  fluticasone (FLOVENT HFA) 220 MCG/ACT inhaler Inhale 2 puffs into  the lungs in the morning and at bedtime. 03/02/21   [provider]  gabapentin (NEURONTIN) 300 MG capsule Take 300 mg by mouth daily. 03/02/21   [provider]  GLIPIZIDE XL 10 MG 24 hr tablet Take 10 mg by mouth daily. 03/02/21   [provider]  glucose blood (TRUE METRIX BLOOD GLUCOSE TEST) test strip Check blood sugar fasting and before meals and again if pt feels bad (symptoms of hypo). Check sugar three times a day 10/10/19   Charlann Lange, PA-C  Insulin Isophane & Regular Human (NOVOLIN 70/30 FLEXPEN RELION) (70-30) 100 UNIT/ML PEN Inject 18 Units into the skin 2 (two) times daily. Patient taking differently: Inject 36 Units into the skin 2 (two) times daily. 10/20/19   Bonnielee Haff, MD  Insulin Pen Needle (NOVOFINE) 30G X 8 MM MISC Inject 10 each into the skin as needed. Patient taking differently: Inject 1 packet into the skin as needed (insulin). 10/20/19   Bonnielee Haff, MD  lidocaine (LIDODERM) 5 % Place 1 patch onto the skin daily. Remove & Discard patch within 12 hours or as directed by MD 05/21/21   Tegeler, Gwenyth Allegra, MD  metFORMIN (GLUCOPHAGE-XR) 500 MG 24 hr tablet Take 1 tablet (500 mg total) by mouth daily with breakfast. Patient not taking: No sig reported 10/20/19   Bonnielee Haff, MD  metoprolol tartrate (LOPRESSOR) 50 MG tablet Take 50 mg by mouth in the morning and at bedtime.    [provider]  pantoprazole (PROTONIX) 40 MG tablet Take 1 tablet (40 mg total) by mouth 2 (two) times daily. 10/20/19   Bonnielee Haff, MD  Semaglutide,0.25 or 0.5MG/DOS, 2 MG/1.5ML SOPN Inject 0.25 mg into the skin once a week. 03/02/21   [provider]  VENTOLIN HFA 108 (90 Base) MCG/ACT inhaler Inhale 1-2 puffs into the lungs daily as needed for wheezing or shortness of breath. 03/02/21   [provider]    Allergies    Lisinopril, Lactose intolerance (gi), and Other  Review of Systems   Review of Systems  Musculoskeletal:  Positive  for back pain.  All other systems reviewed and are negative.  Physical Exam Updated Vital Signs BP (!) 161/81   Pulse 70   Temp 98.1 F (36.7 C) (Oral)   Resp 15   SpO2 100%   Physical Exam Vitals and nursing note reviewed.  Constitutional:      General: He is not in acute distress.    Appearance: Normal appearance.  HENT:     Head: Normocephalic and atraumatic.  Eyes:     General:        Right eye: No discharge.        Left eye: No discharge.  Cardiovascular:     Comments: Regular rate and rhythm.  S1/S2 are distinct without any evidence of murmur, rubs, or gallops.  Radial pulses are 2+ bilaterally.  Dorsalis pedis pulses are 2+ on the left with Doppler. Pulmonary:     Comments: Clear to auscultation bilaterally.  Normal effort.  No respiratory distress.  No evidence of wheezes, rales, or rhonchi heard throughout. Abdominal:     General: Abdomen is flat. Bowel sounds are normal. There is no distension.     Tenderness: There is no abdominal tenderness. There is no guarding or rebound.  Musculoskeletal:        General: Normal range of motion.     Cervical back: Neck supple.     Right lower leg: No edema.     Left lower leg: 3+ Edema present.     Comments: No tenderness over the cervical, thoracic, or lumbar spine.  There is point tenderness of the paralumbar musculature.  Skin:    General: Skin is warm and dry.     Findings: No rash.  Neurological:     General: No focal deficit present.     Mental Status: He is alert.  Psychiatric:        Mood and Affect: Mood normal.        Behavior: Behavior normal.    ED Results / Procedures / Treatments   Labs (all labs ordered are listed, but only abnormal results are displayed) Labs Reviewed  CBC WITH DIFFERENTIAL/PLATELET - Abnormal; Notable for the following components:      Result Value   WBC 12.1 (*)    RBC 3.26 (*)    Hemoglobin 9.5 (*)    HCT 30.1 (*)    Neutro Abs 7.8 (*)    All other components within normal  limits  COMPREHENSIVE METABOLIC PANEL - Abnormal; Notable for the following components:   CO2 20 (*)    BUN 37 (*)    Creatinine, Ser 2.37 (*)    Total Protein 8.3 (*)    AST 10 (*)    GFR, Estimated 33 (*)    All other components within normal limits  SEDIMENTATION RATE - Abnormal; Notable for the following components:   Sed Rate 132 (*)    All other components within normal limits  C-REACTIVE PROTEIN    EKG None  Radiology DG Tibia/Fibula Left  Result Date: 08/14/2021 CLINICAL DATA:  Swelling. EXAM: LEFT TIBIA AND FIBULA - 2 VIEW COMPARISON:  None. FINDINGS: There is diffuse soft tissue swelling. There is no radiopaque foreign body. There are some punctate nonspecific calcifications in the soft tissues. There is a healed proximal fibular diaphyseal fracture. There is no acute fracture or dislocation identified. Joint spaces appear maintained. There is a well corticated density adjacent to the tip of the medial malleolus likely related to old injury. IMPRESSION: 1. No acute bony abnormality. 2. Diffuse soft tissue swelling. Electronically Signed   By: Ronney Asters M.D.   On: 08/14/2021 18:12   US Venous Img Lower Unilateral Left (DVT)  Result Date: 08/14/2021 CLINICAL DATA:  LEFT pain and swelling. EXAM: LEFT LOWER EXTREMITY VENOUS DOPPLER ULTRASOUND TECHNIQUE: Gray-scale sonography with compression, as well as color and duplex ultrasound, were performed to evaluate the deep venous system(s) from the level of the common femoral vein through the popliteal and proximal calf veins. COMPARISON:  None. FINDINGS: VENOUS Normal compressibility of the LEFT common femoral, superficial femoral, popliteal and peroneal and posterior tibial veins. Filling filling defect with expansion of calf vein.  See key image. The visualized portions of profunda femoral  vein and great saphenous vein unremarkable. No filling defects to suggest DVT on grayscale or color Doppler imaging. Doppler waveforms show normal  direction of venous flow, normal respiratory plasticity and response to augmentation. Limited views of the contralateral common femoral vein are unremarkable. OTHER No evidence abnormal fluid collection. Subcutaneous edema within the distal LEFT lower extremity at the level of the ankle. Limitations: none IMPRESSION: 1. No evidence of femoropopliteal DVT within the LEFT lower extremity. 2. Acute superficial thrombophlebitis of the LEFT posterior tibial vein perforator veins. These results will be called to the ordering clinician or representative by the Radiologist Assistant, and communication documented in the PACS or [employer]. Electronically Signed   By: Michaelle Birks M.D.   On: 08/14/2021 16:09   DG Foot 2 Views Left  Result Date: 08/14/2021 CLINICAL DATA:  Left lower extremity x2 weeks with a pre-existing wound to the bottom of the left foot. EXAM: LEFT FOOT - 2 VIEW COMPARISON:  None. FINDINGS: There is no evidence of fracture or dislocation. Moderate severity degenerative changes seen along the dorsal aspect of the mid left foot. There is moderate to marked severity diffuse soft swelling. IMPRESSION: Moderate to marked severity diffuse soft tissue swelling without evidence of acute osseous abnormality. Electronically Signed   By: Virgina Norfolk M.D.   On: 08/14/2021 18:13    Procedures Procedures   Medications Ordered in ED Medications  cyclobenzaprine (FLEXERIL) tablet 5 mg (5 mg Oral Given 08/14/21 1540)  lactated ringers bolus 1,000 mL (0 mLs Intravenous Stopped 08/14/21 1748)    ED Course  I have reviewed the triage vital signs and the nursing notes.  Pertinent labs & imaging results that were available during my care of the patient were reviewed by me and considered in my medical decision making (see chart for details).    MDM Rules/Calculators/A&P                          Isair Corvin is a 50 y.o. male who presents the emergency department for further evaluation of left leg  pain and back pain.  I would low suspicion for any acute pathology of his spine.  This is likely musculoskeletal pain.  With regards to his leg it is swollen and tender given his immobilization in that leg I will evaluate with ultrasound for DVT.  CBC showed leukocytosis.  Chronic ongoing anemia.  CMP showed elevated creatinine which is improved from his baseline.  Elevated BUN.  Seems to be chronic kidney disease.Marland Kitchen  ESR was elevated.  Venous ultrasound was negative for DVT.  Imaging of the foot and tibia on the left were negative.  No signs of osteomyelitis, fracture, or dislocations.  On reevaluation, patient was snoring and resting comfortably in the emergency department.  His back pain is improved but still painful.  I will give him a prescription for Flexeril and tylenol.  We will have him follow-up with his primary care doctor he is safe for discharge. Strict return precautions.   Final Clinical Impression(s) / ED Diagnoses Final diagnoses:  Bilateral back pain, unspecified back location, unspecified chronicity  Left leg swelling    Rx / DC Orders ED Discharge Orders          Ordered    cyclobenzaprine (FLEXERIL) 10 MG tablet  2 times daily PRN        08/14/21 2004    acetaminophen (TYLENOL) 325 MG tablet  Every 6 hours PRN  08/14/21 2004             Myna Bright Anderson, PA-C 08/14/21 2251    Godfrey Pick, MD 08/15/21 1630

## 2021-08-14 NOTE — ED Notes (Signed)
Delay in orders and VS due to pt in testing.

## 2021-08-14 NOTE — ED Notes (Signed)
Pt with swelling to left leg, sore on left great toe and bottom of heel. Pt here due to pain in mid back since 3-4 weeks. He is here today since it is the only day he had the car with a free day.

## 2021-08-14 NOTE — ED Notes (Signed)
Pt ambulated to BR, delay in x-ray due to this.

## 2021-08-14 NOTE — ED Notes (Signed)
Pt resting on side watching TV at present time.

## 2021-08-14 NOTE — ED Triage Notes (Signed)
Pt reports swelling to LLE x2 weeks, pt is going to the wound center in [place] for an pre-existing wound to bottom of Left foot. Pt released from wound center last week.   Pt also reports mid back pain "for a minute"

## 2021-08-14 NOTE — ED Notes (Signed)
Pt resting with head covered.

## 2021-08-14 NOTE — Discharge Instructions (Addendum)
Your work-up was negative for blood clot in your leg.  Please take Flexeril for muscle spasms and Tylenol for pain.  Please follow-up with your primary doctor.  Please return to the emergency room if you experience worsening pain in your leg or your back, or any other concerns you might have.

## 2021-08-14 NOTE — ED Notes (Signed)
Patient continues to rest with head covered. Have placed BP cuff and pulse ox on multiple times since pt has come in, pt keeps removing it.

## 2021-08-15 LAB — C-REACTIVE PROTEIN: CRP: 3.2 mg/dL — ABNORMAL HIGH (ref <1.0)

## 2021-11-03 ENCOUNTER — Emergency Department (HOSPITAL_COMMUNITY)
Admission: EM | Admit: 2021-11-03 | Discharge: 2021-11-03 | Disposition: A | Discharge status: Home / Self Care | Payer: 59 | Attending: Emergency Medicine | Admitting: Emergency Medicine

## 2021-11-03 ENCOUNTER — Encounter (HOSPITAL_COMMUNITY): Payer: Self-pay | Admitting: Emergency Medicine

## 2021-11-03 ENCOUNTER — Other Ambulatory Visit: Payer: Self-pay

## 2021-11-03 DIAGNOSIS — I10 Essential (primary) hypertension: Secondary | ICD-10-CM

## 2021-11-03 DIAGNOSIS — E875 Hyperkalemia: Secondary | ICD-10-CM | POA: Diagnosis not present

## 2021-11-03 DIAGNOSIS — Z7982 Long term (current) use of aspirin: Secondary | ICD-10-CM | POA: Insufficient documentation

## 2021-11-03 DIAGNOSIS — I129 Hypertensive chronic kidney disease with stage 1 through stage 4 chronic kidney disease, or unspecified chronic kidney disease: Secondary | ICD-10-CM | POA: Insufficient documentation

## 2021-11-03 DIAGNOSIS — N184 Chronic kidney disease, stage 4 (severe): Secondary | ICD-10-CM | POA: Insufficient documentation

## 2021-11-03 DIAGNOSIS — Z7902 Long term (current) use of antithrombotics/antiplatelets: Secondary | ICD-10-CM | POA: Insufficient documentation

## 2021-11-03 DIAGNOSIS — R799 Abnormal finding of blood chemistry, unspecified: Secondary | ICD-10-CM | POA: Diagnosis present

## 2021-11-03 LAB — PHOSPHORUS: Phosphorus: 4.8 mg/dL — ABNORMAL HIGH (ref 2.5–4.6)

## 2021-11-03 LAB — COMPREHENSIVE METABOLIC PANEL
ALT: 19 U/L (ref 0–44)
AST: 14 U/L — ABNORMAL LOW (ref 15–41)
Albumin: 3.3 g/dL — ABNORMAL LOW (ref 3.5–5.0)
Alkaline Phosphatase: 70 U/L (ref 38–126)
Anion gap: 8 (ref 5–15)
BUN: 48 mg/dL — ABNORMAL HIGH (ref 6–20)
CO2: 18 mmol/L — ABNORMAL LOW (ref 22–32)
Calcium: 9.1 mg/dL (ref 8.9–10.3)
Chloride: 114 mmol/L — ABNORMAL HIGH (ref 98–111)
Creatinine, Ser: 2.48 mg/dL — ABNORMAL HIGH (ref 0.61–1.24)
GFR, Estimated: 31 mL/min — ABNORMAL LOW (ref >60)
Glucose, Bld: 119 mg/dL — ABNORMAL HIGH (ref 70–99)
Potassium: 5.8 mmol/L — ABNORMAL HIGH (ref 3.5–5.1)
Sodium: 140 mmol/L (ref 135–145)
Total Bilirubin: 0.3 mg/dL (ref 0.3–1.2)
Total Protein: 7.9 g/dL (ref 6.5–8.1)

## 2021-11-03 LAB — CBC WITH DIFFERENTIAL/PLATELET
Abs Immature Granulocytes: 0.02 10*3/uL (ref 0.00–0.07)
Basophils Absolute: 0 10*3/uL (ref 0.0–0.1)
Basophils Relative: 0 %
Eosinophils Absolute: 0.4 10*3/uL (ref 0.0–0.5)
Eosinophils Relative: 4 %
HCT: 33.4 % — ABNORMAL LOW (ref 39.0–52.0)
Hemoglobin: 10.5 g/dL — ABNORMAL LOW (ref 13.0–17.0)
Immature Granulocytes: 0 %
Lymphocytes Relative: 17 %
Lymphs Abs: 1.8 10*3/uL (ref 0.7–4.0)
MCH: 29.9 pg (ref 26.0–34.0)
MCHC: 31.4 g/dL (ref 30.0–36.0)
MCV: 95.2 fL (ref 80.0–100.0)
Monocytes Absolute: 0.7 10*3/uL (ref 0.1–1.0)
Monocytes Relative: 7 %
Neutro Abs: 7.7 10*3/uL (ref 1.7–7.7)
Neutrophils Relative %: 72 %
Platelets: 225 10*3/uL (ref 150–400)
RBC: 3.51 MIL/uL — ABNORMAL LOW (ref 4.22–5.81)
RDW: 13.9 % (ref 11.5–15.5)
WBC: 10.6 10*3/uL — ABNORMAL HIGH (ref 4.0–10.5)
nRBC: 0 % (ref 0.0–0.2)

## 2021-11-03 LAB — MAGNESIUM: Magnesium: 1.9 mg/dL (ref 1.7–2.4)

## 2021-11-03 MED ORDER — DEXTROSE 50 % IV SOLN
25.0000 g | Freq: Once | INTRAVENOUS | Status: AC
  Administered 2021-11-03: 25 g via INTRAVENOUS
  Filled 2021-11-03: qty 50

## 2021-11-03 MED ORDER — INSULIN ASPART 100 UNIT/ML IV SOLN
4.0000 [IU] | Freq: Once | INTRAVENOUS | Status: AC
  Administered 2021-11-03: 4 [IU] via INTRAVENOUS
  Filled 2021-11-03: qty 0.04

## 2021-11-03 MED ORDER — CARVEDILOL 12.5 MG PO TABS
25.0000 mg | ORAL_TABLET | Freq: Once | ORAL | Status: AC
  Administered 2021-11-03: 25 mg via ORAL
  Filled 2021-11-03: qty 2

## 2021-11-03 MED ORDER — LOKELMA 10 G PO PACK: 10.0000 g | PACK | Freq: Every day | ORAL | 0 refills | Status: AC

## 2021-11-03 MED ORDER — SODIUM ZIRCONIUM CYCLOSILICATE 10 G PO PACK
10.0000 g | PACK | Freq: Once | ORAL | Status: AC
  Administered 2021-11-03: 10 g via ORAL
  Filled 2021-11-03: qty 1

## 2021-11-03 MED ORDER — HYDRALAZINE HCL 25 MG PO TABS
25.0000 mg | ORAL_TABLET | Freq: Once | ORAL | Status: AC
Start: 2021-11-03 — End: 2021-11-03
  Administered 2021-11-03: 25 mg via ORAL
  Filled 2021-11-03: qty 1

## 2021-11-03 MED ORDER — SODIUM BICARBONATE 8.4 % IV SOLN
50.0000 meq | Freq: Once | INTRAVENOUS | Status: AC
  Administered 2021-11-03: 50 meq via INTRAVENOUS
  Filled 2021-11-03: qty 50

## 2021-11-03 NOTE — ED Provider Notes (Signed)
[facility] DEPT Provider Note   CSN: [id number] Arrival date & time: 11/03/21  1146     History  Chief Complaint  Patient presents with   abnormal labs    Howard Mcmahon is a 51 y.o. male.  Patient c/o being told K high on labs and need to come to ED.  Patient with hx CKD, and indicates had outpatient labs. Denies feeling acutely sick or ill. No k use. Normal appetite, normal po intake. Urinating normal amount. Denies headache. No chest pain or sob. No abd pain or nvd. No dysuria or gu c/o. No extremity pain, injury or swelling. No fever or chills. Denies trauma, injury or fall.   The history is provided by the patient and medical records.      Home Medications Prior to Admission medications   Medication Sig Start Date End Date Taking? Authorizing Provider  acetaminophen (TYLENOL) 325 MG tablet Take 2 tablets (650 mg total) by mouth every 6 (six) hours as needed. 08/14/21   Hendricks Limes, PA-C  aspirin EC 81 MG tablet Take 81 mg by mouth daily.    [provider]  Blood Glucose Monitoring Suppl (TRUE METRIX METER) w/Device KIT Use as directed Patient taking differently: 1 each by Other route as directed. 01/02/19   Kerin Perna, NP  clopidogrel (PLAVIX) 75 MG tablet Take 1 tablet (75 mg total) by mouth daily with breakfast. 10/20/19   Bonnielee Haff, MD  cyclobenzaprine (FLEXERIL) 10 MG tablet Take 1 tablet (10 mg total) by mouth 2 (two) times daily as needed for muscle spasms. 08/14/21   Myna Bright M, PA-C  fluticasone (FLOVENT HFA) 220 MCG/ACT inhaler Inhale 2 puffs into the lungs in the morning and at bedtime. 03/02/21   [provider]  gabapentin (NEURONTIN) 300 MG capsule Take 300 mg by mouth daily. 03/02/21   [provider]  GLIPIZIDE XL 10 MG 24 hr tablet Take 10 mg by mouth daily. 03/02/21   [provider]  glucose blood (TRUE METRIX BLOOD GLUCOSE TEST) test strip Check blood sugar fasting and  before meals and again if pt feels bad (symptoms of hypo). Check sugar three times a day 10/10/19   Charlann Lange, PA-C  Insulin Isophane & Regular Human (NOVOLIN 70/30 FLEXPEN RELION) (70-30) 100 UNIT/ML PEN Inject 18 Units into the skin 2 (two) times daily. Patient taking differently: Inject 36 Units into the skin 2 (two) times daily. 10/20/19   Bonnielee Haff, MD  Insulin Pen Needle (NOVOFINE) 30G X 8 MM MISC Inject 10 each into the skin as needed. Patient taking differently: Inject 1 packet into the skin as needed (insulin). 10/20/19   Bonnielee Haff, MD  lidocaine (LIDODERM) 5 % Place 1 patch onto the skin daily. Remove & Discard patch within 12 hours or as directed by MD 05/21/21   Tegeler, Gwenyth Allegra, MD  metFORMIN (GLUCOPHAGE-XR) 500 MG 24 hr tablet Take 1 tablet (500 mg total) by mouth daily with breakfast. Patient not taking: No sig reported 10/20/19   Bonnielee Haff, MD  metoprolol tartrate (LOPRESSOR) 50 MG tablet Take 50 mg by mouth in the morning and at bedtime.    [provider]  pantoprazole (PROTONIX) 40 MG tablet Take 1 tablet (40 mg total) by mouth 2 (two) times daily. 10/20/19   Bonnielee Haff, MD  Semaglutide,0.25 or 0.5MG/DOS, 2 MG/1.5ML SOPN Inject 0.25 mg into the skin once a week. 03/02/21   [provider]  VENTOLIN HFA 108 (90 Base) MCG/ACT  inhaler Inhale 1-2 puffs into the lungs daily as needed for wheezing or shortness of breath. 03/02/21   [provider]      Allergies    Lisinopril, Lactose intolerance (gi), and Other    Review of Systems   Review of Systems  Constitutional:  Negative for fever.  HENT:  Negative for sore throat.   Eyes:  Negative for visual disturbance.  Respiratory:  Negative for cough and shortness of breath.   Cardiovascular:  Negative for chest pain.  Gastrointestinal:  Negative for abdominal pain, diarrhea and vomiting.  Genitourinary:  Negative for decreased urine volume and flank pain.  Musculoskeletal:   Negative for back pain and neck pain.  Skin:  Negative for rash.  Neurological:  Negative for headaches.  Hematological:  Does not bruise/bleed easily.  Psychiatric/Behavioral:  Negative for confusion.    Physical Exam Updated Vital Signs BP (!) 157/136 (BP Location: Left Arm)    Pulse 70    Temp 98.3 F (36.8 C) (Oral)    Resp 18    Ht 1.93 m (_0 )    Wt 123 kg    SpO2 97%    BMI 33.01 kg/m  Physical Exam Vitals and nursing note reviewed.  Constitutional:      Appearance: Normal appearance. He is well-developed.  HENT:     Head: Atraumatic.     Nose: Nose normal.     Mouth/Throat:     Mouth: Mucous membranes are moist.     Pharynx: Oropharynx is clear.  Eyes:     General: No scleral icterus.    Conjunctiva/sclera: Conjunctivae normal.  Neck:     Trachea: No tracheal deviation.  Cardiovascular:     Rate and Rhythm: Normal rate and regular rhythm.     Pulses: Normal pulses.     Heart sounds: Normal heart sounds. No murmur heard.   No friction rub. No gallop.  Pulmonary:     Effort: Pulmonary effort is normal. No accessory muscle usage or respiratory distress.     Breath sounds: Normal breath sounds.  Abdominal:     General: Bowel sounds are normal. There is no distension.     Palpations: Abdomen is soft. There is no mass.     Tenderness: There is no abdominal tenderness.  Genitourinary:    Comments: No cva tenderness. Musculoskeletal:        General: No swelling or tenderness.     Cervical back: Normal range of motion and neck supple. No rigidity.     Comments: LLE amputation, site without sign of infection  Skin:    General: Skin is warm and dry.     Findings: No rash.  Neurological:     Mental Status: He is alert.     Comments: Alert, speech clear. Motor/sens grossly intact bil.   Psychiatric:        Mood and Affect: Mood normal.    ED Results / Procedures / Treatments   Labs (all labs ordered are listed, but only abnormal results are displayed) Results for  orders placed or performed during the hospital encounter of 11/03/21  Comprehensive metabolic panel  Result Value Ref Range   Sodium 140 135 - 145 mmol/L   Potassium 5.8 (H) 3.5 - 5.1 mmol/L   Chloride 114 (H) 98 - 111 mmol/L   CO2 18 (L) 22 - 32 mmol/L   Glucose, Bld 119 (H) 70 - 99 mg/dL   BUN 48 (H) 6 - 20 mg/dL   Creatinine, Ser 2.48 (  H) 0.61 - 1.24 mg/dL   Calcium 9.1 8.9 - 10.3 mg/dL   Total Protein 7.9 6.5 - 8.1 g/dL   Albumin 3.3 (L) 3.5 - 5.0 g/dL   AST 14 (L) 15 - 41 U/L   ALT 19 0 - 44 U/L   Alkaline Phosphatase 70 38 - 126 U/L   Total Bilirubin 0.3 0.3 - 1.2 mg/dL   GFR, Estimated 31 (L) >60 mL/min   Anion gap 8 5 - 15  CBC with Differential  Result Value Ref Range   WBC 10.6 (H) 4.0 - 10.5 K/uL   RBC 3.51 (L) 4.22 - 5.81 MIL/uL   Hemoglobin 10.5 (L) 13.0 - 17.0 g/dL   HCT 33.4 (L) 39.0 - 52.0 %   MCV 95.2 80.0 - 100.0 fL   MCH 29.9 26.0 - 34.0 pg   MCHC 31.4 30.0 - 36.0 g/dL   RDW 13.9 11.5 - 15.5 %   Platelets 225 150 - 400 K/uL   nRBC 0.0 0.0 - 0.2 %   Neutrophils Relative % 72 %   Neutro Abs 7.7 1.7 - 7.7 K/uL   Lymphocytes Relative 17 %   Lymphs Abs 1.8 0.7 - 4.0 K/uL   Monocytes Relative 7 %   Monocytes Absolute 0.7 0.1 - 1.0 K/uL   Eosinophils Relative 4 %   Eosinophils Absolute 0.4 0.0 - 0.5 K/uL   Basophils Relative 0 %   Basophils Absolute 0.0 0.0 - 0.1 K/uL   Immature Granulocytes 0 %   Abs Immature Granulocytes 0.02 0.00 - 0.07 K/uL  Magnesium  Result Value Ref Range   Magnesium 1.9 1.7 - 2.4 mg/dL  Phosphorus  Result Value Ref Range   Phosphorus 4.8 (H) 2.5 - 4.6 mg/dL      EKG None  Radiology No results found.  Procedures Procedures    Medications Ordered in ED Medications  sodium bicarbonate injection 50 mEq (has no administration in time range)  dextrose 50 % solution 25 g (has no administration in time range)  insulin aspart (novoLOG) injection 4 Units (has no administration in time range)  sodium zirconium cyclosilicate  (LOKELMA) packet 10 g (has no administration in time range)    ED Course/ Medical Decision Making/ A&P                           Medical Decision Making Problems Addressed: CKD (chronic kidney disease) stage 4, GFR 15-29 ml/min ([place]): chronic illness or injury with exacerbation, progression, or side effects of treatment that poses a threat to life or bodily functions Essential hypertension: chronic illness or injury with exacerbation, progression, or side effects of treatment Hyperkalemia: acute illness or injury that poses a threat to life or bodily functions Uncontrolled hypertension: acute illness or injury  Amount and/or Complexity of Data Reviewed External Data Reviewed: labs and notes. Labs: ordered. Decision-making details documented in ED Course. ECG/medicine tests: ordered and independent interpretation performed. Decision-making details documented in ED Course.  Risk OTC drugs. Prescription drug management. Decision regarding hospitalization.   Iv ns. Continuous pulse ox and cardiac monitoring. Labs ordered. Imaging ordered. Ecg.  Pt reports abn labs, ?high k - considered dispo including admission - will get labs, discussed with renal and reassess.   Reviewed nursing notes and prior charts for additional history.  External reports reviewed. Additional hx from clinic charts.   Cardiac monitor: sinus rhythm, rate 70.  Labs reviewed/interpreted by me - k high, 5.8.   Hco3 iv,  d50 iv, insulin iv. Lokelma po.   Renal consulted. Discussed pt, hx, labs, and ran med list with Dr Jonnie Finner - he indicates other than given meds in ED as ordered, to give lokelma 10 per day for next 3 days, give dietary info, and have f/u pcp for recheck.   Pt with hx htn, indicates did not  take his med today as of yet - will give dose. No headache. No cp or sob. No neuro c/o.   Pt currently appears stable for d/c.   Return precautions provided.            Final Clinical Impression(s)  / ED Diagnoses Final diagnoses:  None    Rx / DC Orders ED Discharge Orders     None         Lajean Saver, MD 11/07/21 [phone]

## 2021-11-03 NOTE — ED Triage Notes (Signed)
BIB EMS from home, PCP reports abnormal labs, K is 5.8, creatinine was 2.63 per lab results.   CBG 120s

## 2021-11-03 NOTE — Discharge Instructions (Addendum)
It was our pleasure to provide your ER care today - we hope that you feel better.  We discussed your case with our kidney doctor. He recommends you take lokelma as prescribed for the next few days, avoid foods high in potassium, and follow up with your doctor for recheck of labs this Monday. Also discuss with your doctor facilitation of outpatient visit with kidney specialist.  Your blood pressure is high today - continue your meds, limit salt intake, and follow up with your doctor this coming week.  Return to ER if worse, new symptoms, fevers, chest pain, trouble breathing, weak/fainting, or other concern.

## 2021-11-03 NOTE — ED Provider Triage Note (Signed)
Emergency Medicine Provider Triage Evaluation Note  Howard Mcmahon , a 51 y.o. male  was evaluated in triage.  Pt complains of abnormal labs.  Patient states that he has been in his normal state of health until he went to [facility] yesterday.  Apparently he had some abnormal labs and they recommended that he come to the emergency department.  He states that his creatinine was 2.63 and potassium is 5.8.  Patient reports that this is abnormal from his baseline as he does not report any kidney issues.  On chart review, we have multiple results of patient having an elevated creatinine and at times an elevated potassium. Patient denies any new symptoms such as nausea, vomiting, chest pain, shortness of breath, abdominal pain, flank pain, dysuria, or hematuria.  Review of Systems  Positive:  Negative:   Physical Exam  BP (!) 188/100    Pulse 70    Temp 98.3 F (36.8 C) (Oral)    Resp 18    Ht 6\' 4"  (1.93 m)    Wt 123 kg    SpO2 100%    BMI 33.01 kg/m  Gen:   Awake, no distress   Resp:  Normal effort  MSK:   Moves extremities without difficulty  Other:    Medical Decision Making  Medically screening exam initiated at 12:31 PM.  Appropriate orders placed.  Herschell Donoghue was informed that the remainder of the evaluation will be completed by another provider, this initial triage assessment does not replace that evaluation, and the importance of remaining in the ED until their evaluation is complete.  We will redraw patient's labs here, however on brief chart review, this does not look like a new finding for the patient.  I will have him evaluated in the back to doing more thorough chart review to figure out if these are new changes versus chronic changes.   Adolphus Birchwood, PA-C 11/03/21 1233

## 2022-01-14 ENCOUNTER — Emergency Department (HOSPITAL_COMMUNITY): Payer: 59

## 2022-01-14 ENCOUNTER — Encounter (HOSPITAL_COMMUNITY): Payer: Self-pay

## 2022-01-14 ENCOUNTER — Other Ambulatory Visit: Payer: Self-pay

## 2022-01-14 ENCOUNTER — Inpatient Hospital Stay (HOSPITAL_COMMUNITY)
Admission: EM | Admit: 2022-01-14 | Discharge: 2022-01-20 | DRG: 304 | Disposition: A | Discharge status: Home / Self Care | Payer: 59 | Attending: Internal Medicine | Admitting: Internal Medicine

## 2022-01-14 DIAGNOSIS — Z951 Presence of aortocoronary bypass graft: Secondary | ICD-10-CM

## 2022-01-14 DIAGNOSIS — E1151 Type 2 diabetes mellitus with diabetic peripheral angiopathy without gangrene: Secondary | ICD-10-CM | POA: Diagnosis present

## 2022-01-14 DIAGNOSIS — K219 Gastro-esophageal reflux disease without esophagitis: Secondary | ICD-10-CM | POA: Diagnosis present

## 2022-01-14 DIAGNOSIS — Z7982 Long term (current) use of aspirin: Secondary | ICD-10-CM

## 2022-01-14 DIAGNOSIS — I25119 Atherosclerotic heart disease of native coronary artery with unspecified angina pectoris: Secondary | ICD-10-CM | POA: Diagnosis present

## 2022-01-14 DIAGNOSIS — E875 Hyperkalemia: Secondary | ICD-10-CM | POA: Diagnosis not present

## 2022-01-14 DIAGNOSIS — E669 Obesity, unspecified: Secondary | ICD-10-CM | POA: Diagnosis present

## 2022-01-14 DIAGNOSIS — E739 Lactose intolerance, unspecified: Secondary | ICD-10-CM | POA: Diagnosis present

## 2022-01-14 DIAGNOSIS — I130 Hypertensive heart and chronic kidney disease with heart failure and stage 1 through stage 4 chronic kidney disease, or unspecified chronic kidney disease: Secondary | ICD-10-CM | POA: Diagnosis present

## 2022-01-14 DIAGNOSIS — Z7902 Long term (current) use of antithrombotics/antiplatelets: Secondary | ICD-10-CM

## 2022-01-14 DIAGNOSIS — E1122 Type 2 diabetes mellitus with diabetic chronic kidney disease: Secondary | ICD-10-CM | POA: Diagnosis present

## 2022-01-14 DIAGNOSIS — Z96641 Presence of right artificial hip joint: Secondary | ICD-10-CM | POA: Diagnosis present

## 2022-01-14 DIAGNOSIS — R079 Chest pain, unspecified: Secondary | ICD-10-CM | POA: Diagnosis present

## 2022-01-14 DIAGNOSIS — E8809 Other disorders of plasma-protein metabolism, not elsewhere classified: Secondary | ICD-10-CM | POA: Diagnosis present

## 2022-01-14 DIAGNOSIS — I255 Ischemic cardiomyopathy: Secondary | ICD-10-CM | POA: Diagnosis present

## 2022-01-14 DIAGNOSIS — I252 Old myocardial infarction: Secondary | ICD-10-CM

## 2022-01-14 DIAGNOSIS — E872 Acidosis, unspecified: Secondary | ICD-10-CM | POA: Diagnosis present

## 2022-01-14 DIAGNOSIS — F32A Depression, unspecified: Secondary | ICD-10-CM | POA: Diagnosis present

## 2022-01-14 DIAGNOSIS — Z79899 Other long term (current) drug therapy: Secondary | ICD-10-CM

## 2022-01-14 DIAGNOSIS — Z794 Long term (current) use of insulin: Secondary | ICD-10-CM

## 2022-01-14 DIAGNOSIS — Z888 Allergy status to other drugs, medicaments and biological substances status: Secondary | ICD-10-CM

## 2022-01-14 DIAGNOSIS — D631 Anemia in chronic kidney disease: Secondary | ICD-10-CM | POA: Diagnosis present

## 2022-01-14 DIAGNOSIS — I5043 Acute on chronic combined systolic (congestive) and diastolic (congestive) heart failure: Secondary | ICD-10-CM | POA: Diagnosis present

## 2022-01-14 DIAGNOSIS — E785 Hyperlipidemia, unspecified: Secondary | ICD-10-CM | POA: Diagnosis present

## 2022-01-14 DIAGNOSIS — Z720 Tobacco use: Secondary | ICD-10-CM

## 2022-01-14 DIAGNOSIS — Z823 Family history of stroke: Secondary | ICD-10-CM

## 2022-01-14 DIAGNOSIS — Z89512 Acquired absence of left leg below knee: Secondary | ICD-10-CM

## 2022-01-14 DIAGNOSIS — Z955 Presence of coronary angioplasty implant and graft: Secondary | ICD-10-CM

## 2022-01-14 DIAGNOSIS — I160 Hypertensive urgency: Principal | ICD-10-CM | POA: Diagnosis present

## 2022-01-14 DIAGNOSIS — Z993 Dependence on wheelchair: Secondary | ICD-10-CM

## 2022-01-14 DIAGNOSIS — N179 Acute kidney failure, unspecified: Secondary | ICD-10-CM | POA: Diagnosis present

## 2022-01-14 DIAGNOSIS — F419 Anxiety disorder, unspecified: Secondary | ICD-10-CM | POA: Diagnosis present

## 2022-01-14 DIAGNOSIS — G4733 Obstructive sleep apnea (adult) (pediatric): Secondary | ICD-10-CM | POA: Diagnosis present

## 2022-01-14 DIAGNOSIS — E1165 Type 2 diabetes mellitus with hyperglycemia: Secondary | ICD-10-CM | POA: Diagnosis present

## 2022-01-14 DIAGNOSIS — Z6828 Body mass index (BMI) 28.0-28.9, adult: Secondary | ICD-10-CM

## 2022-01-14 DIAGNOSIS — N1832 Chronic kidney disease, stage 3b: Secondary | ICD-10-CM | POA: Diagnosis present

## 2022-01-14 NOTE — ED Triage Notes (Addendum)
Patient arrived with complaints of left sided chest pain over the last week. Was taking a blood thinner due previous medical hx but stopped due to leg amputation in January. Reports taking a nitroglycerin an hour ago with no relief.  ?

## 2022-01-15 ENCOUNTER — Observation Stay (HOSPITAL_COMMUNITY): Payer: 59

## 2022-01-15 ENCOUNTER — Observation Stay (HOSPITAL_BASED_OUTPATIENT_CLINIC_OR_DEPARTMENT_OTHER): Payer: 59

## 2022-01-15 ENCOUNTER — Encounter (HOSPITAL_COMMUNITY): Payer: Self-pay | Admitting: Student

## 2022-01-15 DIAGNOSIS — R079 Chest pain, unspecified: Secondary | ICD-10-CM

## 2022-01-15 DIAGNOSIS — I161 Hypertensive emergency: Secondary | ICD-10-CM

## 2022-01-15 DIAGNOSIS — I25110 Atherosclerotic heart disease of native coronary artery with unstable angina pectoris: Secondary | ICD-10-CM

## 2022-01-15 DIAGNOSIS — N179 Acute kidney failure, unspecified: Secondary | ICD-10-CM | POA: Diagnosis not present

## 2022-01-15 DIAGNOSIS — I5020 Unspecified systolic (congestive) heart failure: Secondary | ICD-10-CM | POA: Diagnosis not present

## 2022-01-15 DIAGNOSIS — R609 Edema, unspecified: Secondary | ICD-10-CM | POA: Diagnosis not present

## 2022-01-15 LAB — BASIC METABOLIC PANEL
Anion gap: 6 (ref 5–15)
Anion gap: 8 (ref 5–15)
BUN: 41 mg/dL — ABNORMAL HIGH (ref 6–20)
BUN: 43 mg/dL — ABNORMAL HIGH (ref 6–20)
CO2: 20 mmol/L — ABNORMAL LOW (ref 22–32)
CO2: 22 mmol/L (ref 22–32)
Calcium: 7.3 mg/dL — ABNORMAL LOW (ref 8.9–10.3)
Calcium: 7.6 mg/dL — ABNORMAL LOW (ref 8.9–10.3)
Chloride: 114 mmol/L — ABNORMAL HIGH (ref 98–111)
Chloride: 116 mmol/L — ABNORMAL HIGH (ref 98–111)
Creatinine, Ser: 3.66 mg/dL — ABNORMAL HIGH (ref 0.61–1.24)
Creatinine, Ser: 3.77 mg/dL — ABNORMAL HIGH (ref 0.61–1.24)
GFR, Estimated: 19 mL/min — ABNORMAL LOW (ref >60)
GFR, Estimated: 19 mL/min — ABNORMAL LOW (ref >60)
Glucose, Bld: 141 mg/dL — ABNORMAL HIGH (ref 70–99)
Glucose, Bld: 250 mg/dL — ABNORMAL HIGH (ref 70–99)
Potassium: 4.7 mmol/L (ref 3.5–5.1)
Potassium: 4.9 mmol/L (ref 3.5–5.1)
Sodium: 142 mmol/L (ref 135–145)
Sodium: 144 mmol/L (ref 135–145)

## 2022-01-15 LAB — CBG MONITORING, ED: Glucose-Capillary: 138 mg/dL — ABNORMAL HIGH (ref 70–99)

## 2022-01-15 LAB — URINALYSIS, ROUTINE W REFLEX MICROSCOPIC
Bacteria, UA: NONE SEEN
Bilirubin Urine: NEGATIVE
Glucose, UA: 50 mg/dL — AB
Ketones, ur: NEGATIVE mg/dL
Leukocytes,Ua: NEGATIVE
Nitrite: NEGATIVE
Protein, ur: >=300 mg/dL — AB
Specific Gravity, Urine: 1.013 (ref 1.005–1.030)
pH: 6 (ref 5.0–8.0)

## 2022-01-15 LAB — GLUCOSE, CAPILLARY
Glucose-Capillary: 120 mg/dL — ABNORMAL HIGH (ref 70–99)
Glucose-Capillary: 129 mg/dL — ABNORMAL HIGH (ref 70–99)
Glucose-Capillary: 125 mg/dL — ABNORMAL HIGH (ref 70–99)

## 2022-01-15 LAB — CBC
HCT: 30.5 % — ABNORMAL LOW (ref 39.0–52.0)
Hemoglobin: 9.7 g/dL — ABNORMAL LOW (ref 13.0–17.0)
MCH: 30.5 pg (ref 26.0–34.0)
MCHC: 31.8 g/dL (ref 30.0–36.0)
MCV: 95.9 fL (ref 80.0–100.0)
Platelets: 184 10*3/uL (ref 150–400)
RBC: 3.18 MIL/uL — ABNORMAL LOW (ref 4.22–5.81)
RDW: 13.1 % (ref 11.5–15.5)
WBC: 9.8 10*3/uL (ref 4.0–10.5)
nRBC: 0 % (ref 0.0–0.2)

## 2022-01-15 LAB — ECHOCARDIOGRAM COMPLETE
Area-P 1/2: 3.44 cm2
Calc EF: 51 %
Height: 76 in
S' Lateral: 3.95 cm
Single Plane A2C EF: 54.6 %
Single Plane A4C EF: 47.6 %
Weight: 3680 oz

## 2022-01-15 LAB — TROPONIN I (HIGH SENSITIVITY)
Troponin I (High Sensitivity): 10 ng/L (ref <18)
Troponin I (High Sensitivity): 9 ng/L (ref <18)

## 2022-01-15 LAB — IRON AND TIBC
Iron: 46 ug/dL (ref 45–182)
Saturation Ratios: 22 % (ref 17.9–39.5)
TIBC: 214 ug/dL — ABNORMAL LOW (ref 250–450)
UIBC: 168 ug/dL

## 2022-01-15 LAB — HEMOGLOBIN A1C
Hgb A1c MFr Bld: 6 % — ABNORMAL HIGH (ref 4.8–5.6)
Mean Plasma Glucose: 125.5 mg/dL

## 2022-01-15 LAB — D-DIMER, QUANTITATIVE: D-Dimer, Quant: 1.33 ug/mL-FEU — ABNORMAL HIGH (ref 0.00–0.50)

## 2022-01-15 LAB — BRAIN NATRIURETIC PEPTIDE: B Natriuretic Peptide: 225.1 pg/mL — ABNORMAL HIGH (ref 0.0–100.0)

## 2022-01-15 LAB — SODIUM, URINE, RANDOM: Sodium, Ur: 116 mmol/L

## 2022-01-15 LAB — FERRITIN: Ferritin: 155 ng/mL (ref 24–336)

## 2022-01-15 MED ORDER — IPRATROPIUM-ALBUTEROL 0.5-2.5 (3) MG/3ML IN SOLN
3.0000 mL | Freq: Four times a day (QID) | RESPIRATORY_TRACT | Status: DC
  Administered 2022-01-15: 3 mL via RESPIRATORY_TRACT
  Filled 2022-01-15: qty 3

## 2022-01-15 MED ORDER — CARVEDILOL 12.5 MG PO TABS
12.5000 mg | ORAL_TABLET | Freq: Two times a day (BID) | ORAL | Status: DC
Start: 2022-01-15 — End: 2022-01-15
  Administered 2022-01-15: 12.5 mg via ORAL
  Filled 2022-01-15: qty 1

## 2022-01-15 MED ORDER — TECHNETIUM TO 99M ALBUMIN AGGREGATED: 4.3000 | Freq: Once | INTRAVENOUS | Status: DC

## 2022-01-15 MED ORDER — HYDRALAZINE HCL 25 MG PO TABS
25.0000 mg | ORAL_TABLET | Freq: Three times a day (TID) | ORAL | Status: DC
Start: 2022-01-15 — End: 2022-01-16
  Administered 2022-01-15 – 2022-01-16 (×4): 25 mg via ORAL
  Filled 2022-01-15 (×4): qty 1

## 2022-01-15 MED ORDER — ISOSORBIDE MONONITRATE ER 60 MG PO TB24
60.0000 mg | ORAL_TABLET | Freq: Every day | ORAL | Status: DC
  Administered 2022-01-15 – 2022-01-20 (×6): 60 mg via ORAL
  Filled 2022-01-15 (×7): qty 1

## 2022-01-15 MED ORDER — INSULIN ASPART 100 UNIT/ML IJ SOLN
0.0000 [IU] | Freq: Every day | INTRAMUSCULAR | Status: DC
  Filled 2022-01-15: qty 0.05

## 2022-01-15 MED ORDER — PERFLUTREN LIPID MICROSPHERE
1.0000 mL | INTRAVENOUS | Status: AC | PRN
  Administered 2022-01-15: 2 mL via INTRAVENOUS
  Filled 2022-01-15: qty 10

## 2022-01-15 MED ORDER — CARVEDILOL 12.5 MG PO TABS
25.0000 mg | ORAL_TABLET | Freq: Two times a day (BID) | ORAL | Status: DC
Start: 2022-01-15 — End: 2022-01-15

## 2022-01-15 MED ORDER — ENOXAPARIN SODIUM 40 MG/0.4ML IJ SOSY
40.0000 mg | PREFILLED_SYRINGE | INTRAMUSCULAR | Status: DC
  Administered 2022-01-15 – 2022-01-16 (×2): 40 mg via SUBCUTANEOUS
  Filled 2022-01-15 (×2): qty 0.4

## 2022-01-15 MED ORDER — CARVEDILOL 25 MG PO TABS
25.0000 mg | ORAL_TABLET | Freq: Two times a day (BID) | ORAL | Status: DC
  Administered 2022-01-15 – 2022-01-20 (×10): 25 mg via ORAL
  Filled 2022-01-15 (×10): qty 1

## 2022-01-15 MED ORDER — IPRATROPIUM-ALBUTEROL 0.5-2.5 (3) MG/3ML IN SOLN
3.0000 mL | Freq: Three times a day (TID) | RESPIRATORY_TRACT | Status: DC
  Administered 2022-01-16 – 2022-01-17 (×4): 3 mL via RESPIRATORY_TRACT
  Filled 2022-01-15 (×5): qty 3

## 2022-01-15 MED ORDER — PANTOPRAZOLE SODIUM 40 MG PO TBEC
40.0000 mg | DELAYED_RELEASE_TABLET | Freq: Two times a day (BID) | ORAL | Status: DC
  Administered 2022-01-15 – 2022-01-20 (×11): 40 mg via ORAL
  Filled 2022-01-15 (×11): qty 1

## 2022-01-15 MED ORDER — SODIUM BICARBONATE 650 MG PO TABS
1300.0000 mg | ORAL_TABLET | Freq: Two times a day (BID) | ORAL | Status: AC
  Administered 2022-01-15 (×2): 1300 mg via ORAL
  Filled 2022-01-15 (×2): qty 2

## 2022-01-15 MED ORDER — ACETAMINOPHEN 325 MG PO TABS
650.0000 mg | ORAL_TABLET | Freq: Four times a day (QID) | ORAL | Status: AC | PRN
  Administered 2022-01-15 – 2022-01-16 (×2): 650 mg via ORAL
  Filled 2022-01-15 (×2): qty 2

## 2022-01-15 MED ORDER — ATORVASTATIN CALCIUM 40 MG PO TABS
80.0000 mg | ORAL_TABLET | Freq: Every day | ORAL | Status: DC
  Administered 2022-01-15 – 2022-01-20 (×6): 80 mg via ORAL
  Filled 2022-01-15 (×6): qty 2

## 2022-01-15 MED ORDER — INSULIN ASPART 100 UNIT/ML IJ SOLN
0.0000 [IU] | Freq: Three times a day (TID) | INTRAMUSCULAR | Status: DC
  Administered 2022-01-15 (×2): 2 [IU] via SUBCUTANEOUS
  Administered 2022-01-16: 3 [IU] via SUBCUTANEOUS
  Administered 2022-01-16: 2 [IU] via SUBCUTANEOUS
  Administered 2022-01-16: 3 [IU] via SUBCUTANEOUS
  Administered 2022-01-17 – 2022-01-18 (×5): 2 [IU] via SUBCUTANEOUS
  Administered 2022-01-19: 3 [IU] via SUBCUTANEOUS
  Administered 2022-01-20: 2 [IU] via SUBCUTANEOUS
  Filled 2022-01-15: qty 0.15

## 2022-01-15 MED ORDER — NITROGLYCERIN 0.4 MG SL SUBL: 0.4000 mg | SUBLINGUAL_TABLET | SUBLINGUAL | Status: DC | PRN

## 2022-01-15 MED ORDER — ASPIRIN 81 MG PO CHEW
324.0000 mg | CHEWABLE_TABLET | Freq: Once | ORAL | Status: AC
  Administered 2022-01-15: 324 mg via ORAL
  Filled 2022-01-15: qty 4

## 2022-01-15 NOTE — Progress Notes (Signed)
Patient reports a set of black keys in the ER. When transported to the floor the ER NT reports placing the keys in the patients belonging bag. This RN did not investigate the contents of the bag. Family arrived to retrieve keys but keys were not in the bag. Linen bags also gone through and no evidence of keys found. ER contacted and no keys were found in the room the patient was holding in down there. Charge RN made aware. ?

## 2022-01-15 NOTE — Progress Notes (Incomplete)
?PROGRESS NOTE ? ? ? ?Howard Mcmahon  BJY:[id number] DOB: [birth year] DOA: 01/14/2022 ?PCP: [facility], [place]  ? ?Brief Narrative: ? ? ? ?Assessment & Plan: ?  ?Principal Problem: ?  Chest pain ? ? ?  ? ?Estimated body mass index is 28 kg/m? as calculated from the following: ?  Height as of this encounter: 6\' 4"  (1.93 m). ?  Weight as of this encounter: 104.3 kg. ? ? ?DVT prophylaxis:  ?Code Status:  ?Family Communication: ?Disposition Plan:  ?Status is: Observation ?{Observation:23811} ? ? ? ?Consultants:  ?*** ? ?Procedures:  ?*** ? ?Antimicrobials:  ? ? ?Subjective: ?*** ? ?Objective: ?Vitals:  ? 01/15/22 0615 01/15/22 0630 01/15/22 0645 01/15/22 0830  ?BP: (!) 173/92 (!) 172/91 (!) 143/79 (!) 179/107  ?Pulse: 74 71 70 67  ?Resp: 14 11 13 15   ?Temp:      ?TempSrc:      ?SpO2: 94% 94% 96% 97%  ?Weight:      ?Height:      ? ?No intake or output data in the 24 hours ending 01/15/22 0917 ?Filed Weights  ? 01/15/22 0002  ?Weight: 104.3 kg  ? ? ?Examination: ? ?General exam: Appears calm and comfortable  ?Respiratory system: Clear to auscultation. Respiratory effort normal. ?Cardiovascular system: S1 & S2 heard, RRR. No JVD, murmurs, rubs, gallops or clicks. No pedal edema. ?Gastrointestinal system: Abdomen is nondistended, soft and nontender. No organomegaly or masses felt. Normal bowel sounds heard. ?Central nervous system: Alert and oriented. No focal neurological deficits. ?Extremities: Symmetric 5 x 5 power. ?Skin: No rashes, lesions or ulcers ?Psychiatry: Judgement and insight appear normal. Mood & affect appropriate.  ? ? ? ?Data Reviewed: I have personally reviewed following labs and imaging studies ? ?CBC: ?Recent Labs  ?Lab 01/15/22 ?0001  ?WBC 9.8  ?HGB 9.7*  ?HCT 30.5*  ?MCV 95.9  ?PLT 184  ? ?Basic Metabolic Panel: ?Recent Labs  ?Lab 01/15/22 ?0001  ?NA 142  ?K 4.7  ?CL 114*  ?CO2 20*  ?GLUCOSE 250*  ?BUN 41*  ?CREATININE 3.77*  ?CALCIUM 7.3*  ? ?GFR: ?Estimated Creatinine Clearance: 31.4 mL/min (A) (by C-G  formula based on SCr of 3.77 mg/dL (H)). ?Liver Function Tests: ?No results for input(s): AST, ALT, ALKPHOS, BILITOT, PROT, ALBUMIN in the last 168 hours. ?No results for input(s): LIPASE, AMYLASE in the last 168 hours. ?No results for input(s): AMMONIA in the last 168 hours. ?Coagulation Profile: ?No results for input(s): INR, PROTIME in the last 168 hours. ?Cardiac Enzymes: ?No results for input(s): CKTOTAL, CKMB, CKMBINDEX, TROPONINI in the last 168 hours. ?BNP (last 3 results) ?No results for input(s): PROBNP in the last 8760 hours. ?HbA1C: ?No results for input(s): HGBA1C in the last 72 hours. ?CBG: ?Recent Labs  ?Lab 01/15/22 ?0916  ?GLUCAP 138*  ? ?Lipid Profile: ?No results for input(s): CHOL, HDL, LDLCALC, TRIG, CHOLHDL, LDLDIRECT in the last 72 hours. ?Thyroid Function Tests: ?No results for input(s): TSH, T4TOTAL, FREET4, T3FREE, THYROIDAB in the last 72 hours. ?Anemia Panel: ?No results for input(s): VITAMINB12, FOLATE, FERRITIN, TIBC, IRON, RETICCTPCT in the last 72 hours. ?Sepsis Labs: ?No results for input(s): PROCALCITON, LATICACIDVEN in the last 168 hours. ? ?No results found for this or any previous visit (from the past 240 hour(s)).  ? ? ? ? ? ?Radiology Studies: ?DG Chest 2 View ? ?Result Date: 01/14/2022 ?CLINICAL DATA:  Chest pain.  Left-sided pain for 1 week. EXAM: CHEST - 2 VIEW COMPARISON:  01/07/2021 FINDINGS: Post median sternotomy. Stable heart size and  mediastinal contours. There is diffuse peribronchial thickening that is increased from prior exam, possible mild septal thickening. Confluent consolidation, pleural effusion, or pneumothorax. Thoracic spondylosis IMPRESSION: Diffuse peribronchial thickening that is increased from prior exam, possible mild pulmonary edema superimposed on chronic bronchial thickening. Electronically Signed   By: Keith Rake M.D.   On: 01/14/2022 23:57   ? ? ? ? ? ?Scheduled Meds: ? atorvastatin  80 mg Oral Daily  ? carvedilol  12.5 mg Oral BID  ?  enoxaparin (LOVENOX) injection  40 mg Subcutaneous Q24H  ? hydrALAZINE  25 mg Oral TID  ? insulin aspart  0-15 Units Subcutaneous TID WC  ? insulin aspart  0-5 Units Subcutaneous QHS  ? ipratropium-albuterol  3 mL Nebulization Q6H  ? sodium bicarbonate  1,300 mg Oral BID  ? ?Continuous Infusions: ? ? LOS: 0 days  ? ? ?Time spent: 35 minutes ? ? ? ?Elmarie Shiley, MD ?[facility] ? ? ?If 7PM-7AM, please contact night-coverage ?[email or web address] ? ?01/15/2022, 9:17 AM   ?

## 2022-01-15 NOTE — Consult Note (Addendum)
?Cardiology Consultation:  ? ?Patient ID: Howard Mcmahon ?MRN: [id number]; DOB: 1971-07-31 ? ?Admit date: 01/14/2022 ?Date of Consult: 01/15/2022 ? ?PCP:  [place], [place] ?  ?[facility] Providers ?Cardiologist:  Werner Lean, MD new, [facility] Dr. Grier Rocher  ? ? ?Patient Profile:  ? ?Howard Mcmahon is a 51 y.o. male with a hx of CAD with remote PCI to OM1 and mRCA now s/p CABG (LIMA-LAD, SVG-dRCA, SVG-OM2 in 2019, HFpEF, CKD, OSA not on CPAP, obesity, HLD, HTN, DM wit foot wounds, and depression with hx of suicidal ideation, who is being seen 01/15/2022 for the evaluation of chest pain at the request of Dr. Tyrell Antonio. ? ?History of Present Illness:  ? ?Howard Mcmahon was following with [facility] cardiology as of 06/2021. He has the above cardiac history with CABG in 2019. He was seen by cards 06/22/21 for chest pain relieved with nitro. Imdur was increased and he underwent PET stress test 07/25/21 with no evidence of inducible ischemia or scarring, LVEF 50%, diffuse microvascular disease.  ? ?He was maintained on ASA, plavix, 80 mg lipitor, 20 mg lasix daily, and imdur.  ? ?He was hospitalized 09/08/21 with sepsis and AoCKD, COVID infection subsequently had left BKA 09/13/21 - plavix stopped at this time.  He was discharged to inpatient rehab.  ? ?He presented to [facility] with left sided chest pain x 1 week that was not improved with home nitro. CP was intermittent and now constant, aching with radiation to left arm. CE have been negative, EKG nonischemic. He was admitted for observation and cardiology consulted.  ? ?He reports to me that he has had chest pain 2-3 times daily for the last 2-3 weeks. CP lasts 10-20 minutes at a time. He had chest pain while driving last night and took a nitro without relief prompting ER evaluation. His biggest complaint now is back pain, not having chest pain despite BP 179/102. Exertional symptoms difficult as he is in a wheelchair after BKA and has not been fitted for a prosthesis  yet.  ? ?Past Medical History:  ?Diagnosis Date  ? Chronic kidney disease   ? Diabetes mellitus without complication ([place])   ? Hypertension   ? Macular infarction   ? 2012  ? MI (myocardial infarction) ([place])   ? Peripheral vascular disease ([place])   ? Neuropathy  ? Pneumonia   ? ? ?Past Surgical History:  ?Procedure Laterality Date  ? CARDIAC SURGERY    ? CORONARY ANGIOPLASTY WITH STENT PLACEMENT    ? CORONARY ARTERY BYPASS GRAFT    ? HIP ARTHROPLASTY Right   ?  ? ?Home Medications:  ?Prior to Admission medications   ?Medication Sig Start Date End Date Taking? Authorizing Provider  ?acetaminophen (TYLENOL) 325 MG tablet Take 2 tablets (650 mg total) by mouth every 6 (six) hours as needed. ?Patient taking differently: Take 650 mg by mouth every 6 (six) hours as needed for mild pain. 08/14/21  Yes Raul Del, Conner M, PA-C  ?atorvastatin (LIPITOR) 80 MG tablet Take 80 mg by mouth daily. 10/03/21  Yes [provider]  ?carvedilol (COREG) 25 MG tablet Take 25 mg by mouth 2 (two) times daily. 10/03/21  Yes [provider]  ?hydrALAZINE (APRESOLINE) 25 MG tablet Take 25 mg by mouth 3 (three) times daily. 10/27/21  Yes [provider]  ?Insulin Isophane & Regular Human (NOVOLIN 70/30 FLEXPEN RELION) (70-30) 100 UNIT/ML PEN Inject 18 Units into the skin 2 (two) times daily. ?Patient taking differently: Inject 36 Units into  the skin 2 (two) times daily. 10/20/19  Yes Bonnielee Haff, MD  ?nitroGLYCERIN (NITROSTAT) 0.4 MG SL tablet Place 0.4 mg under the tongue every 5 (five) minutes as needed for chest pain. 08/10/21  Yes [provider]  ?VENTOLIN HFA 108 (90 Base) MCG/ACT inhaler Inhale 1-2 puffs into the lungs daily as needed for wheezing or shortness of breath. 03/02/21  Yes [provider]  ?Blood Glucose Monitoring Suppl (TRUE METRIX METER) w/Device KIT Use as directed ?Patient taking differently: 1 each by Other route as directed. 01/02/19   Kerin Perna, NP  ?glucose blood (TRUE  METRIX BLOOD GLUCOSE TEST) test strip Check blood sugar fasting and before meals and again if pt feels bad (symptoms of hypo). Check sugar three times a day 10/10/19   Charlann Lange, PA-C  ?Insulin Pen Needle (NOVOFINE) 30G X 8 MM MISC Inject 10 each into the skin as needed. ?Patient taking differently: Inject 1 packet into the skin as needed (insulin). 10/20/19   Bonnielee Haff, MD  ?metFORMIN (GLUCOPHAGE-XR) 500 MG 24 hr tablet Take 1 tablet (500 mg total) by mouth daily with breakfast. ?Patient not taking: Reported on 01/07/2021 10/20/19   Bonnielee Haff, MD  ?Semaglutide,0.25 or 0.5MG /DOS, 2 MG/1.5ML SOPN Inject 0.25 mg into the skin once a week. ?Patient not taking: Reported on 01/15/2022 03/02/21   [provider]  ? ? ?Inpatient Medications: ?Scheduled Meds: ? atorvastatin  80 mg Oral Daily  ? carvedilol  12.5 mg Oral BID  ? enoxaparin (LOVENOX) injection  40 mg Subcutaneous Q24H  ? hydrALAZINE  25 mg Oral TID  ? insulin aspart  0-15 Units Subcutaneous TID WC  ? insulin aspart  0-5 Units Subcutaneous QHS  ? ipratropium-albuterol  3 mL Nebulization Q6H  ? pantoprazole  40 mg Oral BID  ? sodium bicarbonate  1,300 mg Oral BID  ? ?Continuous Infusions: ? ?PRN Meds: ?nitroGLYCERIN ? ?Allergies:    ?Allergies  ?Allergen Reactions  ? Lisinopril Swelling  ? Lactose Intolerance (Gi)   ? Other Diarrhea  ?  Lactulose intolerant ?Lactulose intolerant  ? ? ?Social History:   ?Social History  ? ?Socioeconomic History  ? Marital status: Widowed  ?  Spouse name: Not on file  ? Number of children: Not on file  ? Years of education: Not on file  ? Highest education level: Not on file  ?Occupational History  ? Not on file  ?Tobacco Use  ? Smoking status: Former  ?  Packs/day: 2.00  ?  Years: 30.00  ?  Pack years: 60.00  ?  Types: Cigarettes  ? Smokeless tobacco: Current  ?Vaping Use  ? Vaping Use: Never used  ?Substance and Sexual Activity  ? Alcohol use: No  ? Drug use: No  ? Sexual activity: Not on file  ?Other Topics  Concern  ? Not on file  ?Social History Narrative  ? Not on file  ? ?Social Determinants of Health  ? ?Financial Resource Strain: Not on file  ?Food Insecurity: Not on file  ?Transportation Needs: Not on file  ?Physical Activity: Not on file  ?Stress: Not on file  ?Social Connections: Not on file  ?Intimate Partner Violence: Not on file  ?  ?Family History:   ? ?Family History  ?Problem Relation Age of Onset  ? Stroke Mother   ?  ? ?ROS:  ?Please see the history of present illness.  ? ?All other ROS reviewed and negative.    ? ?Physical Exam/Data:  ? ?Vitals:  ?  01/15/22 0830 01/15/22 0915 01/15/22 0930 01/15/22 0945  ?BP: (!) 179/107 (!) 185/140 (!) 164/125 (!) 172/98  ?Pulse: 67 69 70 65  ?Resp: _0 ?Temp:      ?TempSrc:      ?SpO2: 97% 94% 97% 99%  ?Weight:      ?Height:      ? ?No intake or output data in the 24 hours ending 01/15/22 1043 ? ?  01/15/2022  ? 12:02 AM 11/03/2021  ? 11:53 AM 05/21/2021  ?  5:39 PM  ?Last 3 Weights  ?Weight (lbs) 230 lb 271 lb 2.7 oz 271 lb  ?Weight (kg) 104.327 kg 123 kg 122.925 kg  ?   ?Body mass index is 28 kg/m?.  ?General:  obese male in NAD ?HEENT: normal ?Neck: no JVD - exam difficult ?Vascular: No carotid bruits; Distal pulses 2+ bilaterally ?Cardiac:  normal S1, S2; RRR; no murmur  ?Lungs:  clear to auscultation bilaterally, no wheezing, rhonchi or rales  ?Abd: soft, nontender, no hepatomegaly  ?Ext: RLE edema, left BKA ?Musculoskeletal:  No deformities, BUE and BLE strength normal and equal ?Skin: warm and dry  ?Neuro:  CNs 2-12 intact, no focal abnormalities noted ?Psych:  Normal affect  ? ?EKG:  The EKG was personally reviewed and demonstrates:  sinus rhythm with HR 78, TWI III seen previously ?Telemetry:  Telemetry was personally reviewed and demonstrates:  sinus rhythm HR 60-70s ? ?Relevant CV Studies: ? ?PET stress 07/25/21: ?1.  No evidence of inducible ischemia or scarring.  ?2.  Left ventricular ejection fraction is 50%%.  ?3.  Decreased coronary flow reserve  in all territories and decreased myocardial blood flow compatible with diffuse microvascular disease. ? ? ?Echo 03/2020: ?Left Ventricle  ?Normal left ventricle. Wall thickness is normal. Systolic function

## 2022-01-15 NOTE — Progress Notes (Incomplete)
{  Select Note:[id number]} 

## 2022-01-15 NOTE — Progress Notes (Signed)
BLE venous duplex has been completed. ? ? ?Results can be found under chart review under CV PROC. ?01/15/2022 12:54 PM ?Garison Genova RVT, RDMS ? ?

## 2022-01-15 NOTE — TOC Progression Note (Addendum)
Transition of Care (TOC) - Progression Note  ? ? ?Patient Details  ?Name: Maxximus Gotay ?MRN: [id number] ?Date of Birth: 05-Mar-1971 ? ?Transition of Care (TOC) CM/SW Contact  ?Purcell Mouton, RN ?Phone Number: ?01/15/2022, 2:59 PM ? ?Clinical Narrative:    ? ?Transition of Care (TOC) Screening Note ? ? ?Patient Details  ?Name: Honor Fairbank ?Date of Birth: [birth year] ? ? ?Transition of Care (TOC) CM/SW Contact:    ?Purcell Mouton, RN ?Phone Number: ?01/15/2022, 3:01 PM ? ? ? ?Transition of Care Department [facility]) has reviewed patient and no TOC needs have been identified at this time. We will continue to monitor patient advancement through interdisciplinary progression rounds. If new patient transition needs arise, please place a TOC consult. ?  ? ?  ?  ? ?Expected Discharge Plan and Services ?  ?  ?  ?  ?  ?                ?  ?  ?  ?  ?  ?  ?  ?  ?  ?  ? ? ?Social Determinants of Health (SDOH) Interventions ?  ? ?Readmission Risk Interventions ?   ? View : No data to display.  ?  ?  ?  ? ? ?

## 2022-01-15 NOTE — ED Provider Notes (Signed)
?Howard Mcmahon DEPT ?Provider Note ? ? ?CSN: [id number] ?Arrival date & time: 01/14/22  2332 ? ?  ? ?History ? ?Chief Complaint  ?Patient presents with  ? Chest Pain  ? ? ?Howard Mcmahon is a 51 y.o. male with a hx of CAD S/p stent placement, CKD, diabetes mellitus, peripheral vascular disease, hypertension, and hyperlipidemia who presents to the emergency department with complaints of chest pain over the past week that has progressively worsened.  Patient states pain was initially intermittent, it is now constant, pain is to the left chest, aching in nature, at times radiates into the left upper extremity.  He has had associated shortness of breath.  No alleviating or aggravating factors.  He tried nitroglycerin at home without relief.  He denies nausea, vomiting, diaphoresis, dizziness, syncope, or hemoptysis.  Denies recent surgery/trauma, recent long travel, hormone use, personal hx of cancer, or hx of DVT/PE.  Previously on Plavix, this was discontinued around the time of his left BKA surgery in January of this year.  He has noted some increase swelling in his legs. ? ? ? ?HPI ? ?  ? ?Home Medications ?Prior to Admission medications   ?Medication Sig Start Date End Date Taking? Authorizing Provider  ?acetaminophen (TYLENOL) 325 MG tablet Take 2 tablets (650 mg total) by mouth every 6 (six) hours as needed. 08/14/21   Hendricks Limes, PA-C  ?aspirin EC 81 MG tablet Take 81 mg by mouth daily.    [provider]  ?Blood Glucose Monitoring Suppl (TRUE METRIX METER) w/Device KIT Use as directed ?Patient taking differently: 1 each by Other route as directed. 01/02/19   Kerin Perna, NP  ?clopidogrel (PLAVIX) 75 MG tablet Take 1 tablet (75 mg total) by mouth daily with breakfast. 10/20/19   Bonnielee Haff, MD  ?cyclobenzaprine (FLEXERIL) 10 MG tablet Take 1 tablet (10 mg total) by mouth 2 (two) times daily as needed for muscle spasms. 08/14/21   Hendricks Limes, PA-C  ?fluticasone  (FLOVENT HFA) 220 MCG/ACT inhaler Inhale 2 puffs into the lungs in the morning and at bedtime. 03/02/21   [provider]  ?gabapentin (NEURONTIN) 300 MG capsule Take 300 mg by mouth daily. 03/02/21   [provider]  ?GLIPIZIDE XL 10 MG 24 hr tablet Take 10 mg by mouth daily. 03/02/21   [provider]  ?glucose blood (TRUE METRIX BLOOD GLUCOSE TEST) test strip Check blood sugar fasting and before meals and again if pt feels bad (symptoms of hypo). Check sugar three times a day 10/10/19   Charlann Lange, PA-C  ?Insulin Isophane & Regular Human (NOVOLIN 70/30 FLEXPEN RELION) (70-30) 100 UNIT/ML PEN Inject 18 Units into the skin 2 (two) times daily. ?Patient taking differently: Inject 36 Units into the skin 2 (two) times daily. 10/20/19   Bonnielee Haff, MD  ?Insulin Pen Needle (NOVOFINE) 30G X 8 MM MISC Inject 10 each into the skin as needed. ?Patient taking differently: Inject 1 packet into the skin as needed (insulin). 10/20/19   Bonnielee Haff, MD  ?lidocaine (LIDODERM) 5 % Place 1 patch onto the skin daily. Remove & Discard patch within 12 hours or as directed by MD 05/21/21   Tegeler, Gwenyth Allegra, MD  ?metFORMIN (GLUCOPHAGE-XR) 500 MG 24 hr tablet Take 1 tablet (500 mg total) by mouth daily with breakfast. ?Patient not taking: No sig reported 10/20/19   Bonnielee Haff, MD  ?metoprolol tartrate (LOPRESSOR) 50 MG tablet Take 50 mg by mouth in the morning and at  bedtime.    [provider]  ?pantoprazole (PROTONIX) 40 MG tablet Take 1 tablet (40 mg total) by mouth 2 (two) times daily. 10/20/19   Bonnielee Haff, MD  ?Semaglutide,0.25 or 0.5MG/DOS, 2 MG/1.5ML SOPN Inject 0.25 mg into the skin once a week. 03/02/21   [provider]  ?VENTOLIN HFA 108 (90 Base) MCG/ACT inhaler Inhale 1-2 puffs into the lungs daily as needed for wheezing or shortness of breath. 03/02/21   [provider]  ?   ? ?Allergies    ?Lisinopril, Lactose intolerance (gi), and Other   ? ?Review of  Systems   ?Review of Systems  ?Constitutional:  Negative for chills, diaphoresis and fever.  ?Respiratory:  Positive for shortness of breath. Negative for cough.   ?Cardiovascular:  Positive for chest pain and leg swelling.  ?Gastrointestinal:  Negative for nausea and vomiting.  ?Neurological:  Negative for syncope.  ?All other systems reviewed and are negative. ? ?Physical Exam ?Updated Vital Signs ?BP (!) 167/94   Pulse 72   Temp 98 ?F (36.7 ?C) (Oral)   Resp 20   Ht _0  (1.93 m)   Wt 104.3 kg   SpO2 97%   BMI 28.00 kg/m?  ?Physical Exam ?Vitals and nursing note reviewed.  ?Constitutional:   ?   General: He is not in acute distress. ?   Appearance: He is well-developed. He is not toxic-appearing.  ?HENT:  ?   Head: Normocephalic and atraumatic.  ?Eyes:  ?   General:     ?   Right eye: No discharge.     ?   Left eye: No discharge.  ?   Conjunctiva/sclera: Conjunctivae normal.  ?Cardiovascular:  ?   Rate and Rhythm: Normal rate and regular rhythm.  ?Pulmonary:  ?   Effort: No respiratory distress.  ?   Breath sounds: Normal breath sounds. No wheezing or rales.  ?Abdominal:  ?   General: There is no distension.  ?   Palpations: Abdomen is soft.  ?   Tenderness: There is no abdominal tenderness.  ?Musculoskeletal:  ?   Cervical back: Neck supple.  ?   Comments: Left lower extremity BKA.  Peripheral edema noted.  No calf tenderness to the right lower extremity.  ?Skin: ?   General: Skin is warm and dry.  ?Neurological:  ?   Mental Status: He is alert.  ?   Comments: Clear speech.   ?Psychiatric:     ?   Behavior: Behavior normal.  ? ? ?ED Results / Procedures / Treatments   ?Labs ?(all labs ordered are listed, but only abnormal results are displayed) ?Labs Reviewed  ?BASIC METABOLIC PANEL - Abnormal; Notable for the following components:  ?    Result Value  ? Chloride 114 (*)   ? CO2 20 (*)   ? Glucose, Bld 250 (*)   ? BUN 41 (*)   ? Creatinine, Ser 3.77 (*)   ? Calcium 7.3 (*)   ? GFR, Estimated 19 (*)   ?  All other components within normal limits  ?CBC - Abnormal; Notable for the following components:  ? RBC 3.18 (*)   ? Hemoglobin 9.7 (*)   ? HCT 30.5 (*)   ? All other components within normal limits  ?TROPONIN I (HIGH SENSITIVITY)  ? ? ?EKG ?EKG Interpretation ? ?Date/Time:  Sunday Jan 14 2022 23:39:59 EDT ?Ventricular Rate:  78 ?PR Interval:  169 ?QRS Duration: 92 ?QT Interval:  400 ?QTC Calculation: 456 ?R Axis:  37 ?Text Interpretation: Sinus rhythm Low voltage, precordial leads Artifact Confirmed by Molpus, John [phone]) on 01/15/2022 12:00:49 AM ? ?Radiology ?DG Chest 2 View ? ?Result Date: 01/14/2022 ?CLINICAL DATA:  Chest pain.  Left-sided pain for 1 week. EXAM: CHEST - 2 VIEW COMPARISON:  01/07/2021 FINDINGS: Post median sternotomy. Stable heart size and mediastinal contours. There is diffuse peribronchial thickening that is increased from prior exam, possible mild septal thickening. Confluent consolidation, pleural effusion, or pneumothorax. Thoracic spondylosis IMPRESSION: Diffuse peribronchial thickening that is increased from prior exam, possible mild pulmonary edema superimposed on chronic bronchial thickening. Electronically Signed   By: Keith Rake M.D.   On: 01/14/2022 23:57   ? ?Procedures ?Procedures  ? ? ?Medications Ordered in ED ?Medications - No data to display ? ?ED Course/ Medical Decision Making/ A&P ?  ?                        ?Medical Decision Making ?Amount and/or Complexity of Data Reviewed ?Labs: ordered. ?Radiology: ordered. ? ?Risk ?OTC drugs. ?Prescription drug management. ?Decision regarding hospitalization. ? ?Patient presents to the emergency department with chest pain. Patient nontoxic appearing, in no apparent distress, vitals with elevated blood pressure. Fairly benign physical exam, some peripheral edema noted.  ? ?DDX including but not limited to: ACS, pulmonary embolism, dissection, pneumothorax, pneumonia, arrhythmia, severe anemia, MSK, GERD, anxiety, abdominal process,  CHF.  ? ?Additional history obtained:  ?Chart & nursing note reviewed.  ?Reviewed most recent labs to compare to today's. ? ?EKG: No STEMI. ? ?Lab Tests:  ?I reviewed & interpreted labs including:  ?CBC: Mild a

## 2022-01-15 NOTE — H&P (Signed)
?History and Physical ? ?Howard Mcmahon HZG:[id number] DOB: 12/07/1970 DOA: 01/14/2022 ? ?Referring physician: Micheal Likens, PA-EDP  ?PCP: [facility]  ?Outpatient Specialists: Cardiology. ?Patient coming from: Home. ? ?Chief Complaint: Chest pain. ? ?HPI: Howard Mcmahon is a 51 y.o. male with medical history significant for type 2 diabetes, peripheral vascular disease status post left BKA, OSA, chronic anxiety/depression, GERD, coronary artery disease, hyperlipidemia, who presented to [facility] ED due to chest pain while shopping.  Associated with shortness of breath.  Left-sided, nonradiating sharp 7 out of 10.  He took a nitroglycerin in his car and drove himself to the ED.  States this chest pain was different than prior chest chest pain that led to CABG in 2019, the pain he was having at that time was centrally located at that time and causing tingling to his arm.  Work-up in the ED revealed high-sensitivity troponin negative x2.  No evidence of acute ischemia on twelve-lead EKG.  EDP requested admission for observation.  Admitted by hospitalist service. ? ?ED Course: Tmax 98.  BP 172/85, pulse 84, respiratory rate 13, saturation 88% on room air.  Lab studies significant for WBC 9.8, hemoglobin 9.7 at baseline, MCV 95, platelet 184.  Serum bicarb 20, glucose 250, BUN 41, creatinine 3.77 with baseline of 2.48, GFR of 31.  BNP 225.  Chest x-ray mild pulmonary edema noted. ? ?Review of Systems: ?Review of systems as noted in the HPI. All other systems reviewed and are negative. ? ? ?Past Medical History:  ?Diagnosis Date  ? Chronic kidney disease   ? Diabetes mellitus without complication (HCC)   ? Hypertension   ? Macular infarction   ? 2012  ? MI (myocardial infarction) (HCC)   ? Peripheral vascular disease (HCC)   ? Neuropathy  ? Pneumonia   ? ?Past Surgical History:  ?Procedure Laterality Date  ? CARDIAC SURGERY    ? CORONARY ANGIOPLASTY WITH STENT PLACEMENT    ? CORONARY ARTERY BYPASS GRAFT    ? HIP ARTHROPLASTY  Right   ? ? ?Social History:  reports that he has quit smoking. His smoking use included cigarettes. He has a 60.00 pack-year smoking history. He uses smokeless tobacco. He reports that he does not drink alcohol and does not use drugs. ? ? ?Allergies  ?Allergen Reactions  ? Lisinopril Swelling  ? Lactose Intolerance (Gi)   ? Other Diarrhea  ?  Lactulose intolerant ?Lactulose intolerant  ? ? ?Family History  ?Problem Relation Age of Onset  ? Stroke Mother   ?  ? ? ?Prior to Admission medications   ?Medication Sig Start Date End Date Taking? Authorizing Provider  ?acetaminophen (TYLENOL) 325 MG tablet Take 2 tablets (650 mg total) by mouth every 6 (six) hours as needed. 08/14/21   Teressa Lower, PA-C  ?aspirin EC 81 MG tablet Take 81 mg by mouth daily.    [provider]  ?Blood Glucose Monitoring Suppl (TRUE METRIX METER) w/Device KIT Use as directed ?Patient taking differently: 1 each by Other route as directed. 01/02/19   Grayce Sessions, NP  ?clopidogrel (PLAVIX) 75 MG tablet Take 1 tablet (75 mg total) by mouth daily with breakfast. 10/20/19   Osvaldo Shipper, MD  ?cyclobenzaprine (FLEXERIL) 10 MG tablet Take 1 tablet (10 mg total) by mouth 2 (two) times daily as needed for muscle spasms. 08/14/21   Teressa Lower, PA-C  ?fluticasone (FLOVENT HFA) 220 MCG/ACT inhaler Inhale 2 puffs into the lungs in the morning and at bedtime. 03/02/21  [provider]  ?gabapentin (NEURONTIN) 300 MG capsule Take 300 mg by mouth daily. 03/02/21   [provider]  ?GLIPIZIDE XL 10 MG 24 hr tablet Take 10 mg by mouth daily. 03/02/21   [provider]  ?glucose blood (TRUE METRIX BLOOD GLUCOSE TEST) test strip Check blood sugar fasting and before meals and again if pt feels bad (symptoms of hypo). Check sugar three times a day 10/10/19   Charlann Lange, PA-C  ?Insulin Isophane & Regular Human (NOVOLIN 70/30 FLEXPEN RELION) (70-30) 100 UNIT/ML PEN Inject 18 Units into the skin 2 (two) times  daily. ?Patient taking differently: Inject 36 Units into the skin 2 (two) times daily. 10/20/19   Bonnielee Haff, MD  ?Insulin Pen Needle (NOVOFINE) 30G X 8 MM MISC Inject 10 each into the skin as needed. ?Patient taking differently: Inject 1 packet into the skin as needed (insulin). 10/20/19   Bonnielee Haff, MD  ?lidocaine (LIDODERM) 5 % Place 1 patch onto the skin daily. Remove & Discard patch within 12 hours or as directed by MD 05/21/21   Tegeler, Gwenyth Allegra, MD  ?metFORMIN (GLUCOPHAGE-XR) 500 MG 24 hr tablet Take 1 tablet (500 mg total) by mouth daily with breakfast. ?Patient not taking: No sig reported 10/20/19   Bonnielee Haff, MD  ?metoprolol tartrate (LOPRESSOR) 50 MG tablet Take 50 mg by mouth in the morning and at bedtime.    [provider]  ?pantoprazole (PROTONIX) 40 MG tablet Take 1 tablet (40 mg total) by mouth 2 (two) times daily. 10/20/19   Bonnielee Haff, MD  ?Semaglutide,0.25 or 0.5MG /DOS, 2 MG/1.5ML SOPN Inject 0.25 mg into the skin once a week. 03/02/21   [provider]  ?VENTOLIN HFA 108 (90 Base) MCG/ACT inhaler Inhale 1-2 puffs into the lungs daily as needed for wheezing or shortness of breath. 03/02/21   [provider]  ? ? ?Physical Exam: ?BP 140/85   Pulse 84   Temp 98 ?F (36.7 ?C) (Oral)   Resp 16   Ht 6\' 4"  (1.93 m)   Wt 104.3 kg   SpO2 91%   BMI 28.00 kg/m?  ? ?General: 51 y.o. year-old male well developed well nourished in no acute distress.  Alert and oriented x3. ?Cardiovascular: Regular rate and rhythm with no rubs or gallops.  No thyromegaly or JVD noted.  Trace lower extremity edema in right lower extremity.  Left BKA. ?Respiratory: Mild rales at bases with no wheezes or rales. Good inspiratory effort. ?Abdomen: Soft nontender nondistended with normal bowel sounds x4 quadrants. ?Muskuloskeletal: Left below the knee amputation.  Right lower extremity with trace edema. ?Neuro: CN II-XII intact, strength, sensation, reflexes ?Skin: No ulcerative  lesions noted or rashes ?Psychiatry: Judgement and insight appear normal. Mood is appropriate for condition and setting ?   ?   ?   ?Labs on Admission:  ?Basic Metabolic Panel: ?Recent Labs  ?Lab 01/15/22 ?0001  ?NA 142  ?K 4.7  ?CL 114*  ?CO2 20*  ?GLUCOSE 250*  ?BUN 41*  ?CREATININE 3.77*  ?CALCIUM 7.3*  ? ?Liver Function Tests: ?No results for input(s): AST, ALT, ALKPHOS, BILITOT, PROT, ALBUMIN in the last 168 hours. ?No results for input(s): LIPASE, AMYLASE in the last 168 hours. ?No results for input(s): AMMONIA in the last 168 hours. ?CBC: ?Recent Labs  ?Lab 01/15/22 ?0001  ?WBC 9.8  ?HGB 9.7*  ?HCT 30.5*  ?MCV 95.9  ?PLT 184  ? ?Cardiac Enzymes: ?No results for input(s): CKTOTAL, CKMB, CKMBINDEX, TROPONINI in the last  168 hours. ? ?BNP (last 3 results) ?Recent Labs  ?  01/15/22 ?0147  ?BNP 225.1*  ? ? ?ProBNP (last 3 results) ?No results for input(s): PROBNP in the last 8760 hours. ? ?CBG: ?No results for input(s): GLUCAP in the last 168 hours. ? ?Radiological Exams on Admission: ?DG Chest 2 View ? ?Result Date: 01/14/2022 ?CLINICAL DATA:  Chest pain.  Left-sided pain for 1 week. EXAM: CHEST - 2 VIEW COMPARISON:  01/07/2021 FINDINGS: Post median sternotomy. Stable heart size and mediastinal contours. There is diffuse peribronchial thickening that is increased from prior exam, possible mild septal thickening. Confluent consolidation, pleural effusion, or pneumothorax. Thoracic spondylosis IMPRESSION: Diffuse peribronchial thickening that is increased from prior exam, possible mild pulmonary edema superimposed on chronic bronchial thickening. Electronically Signed   By: Keith Rake M.D.   On: 01/14/2022 23:57   ? ?EKG: I independently viewed the EKG done and my findings are as followed: Sinus rhythm rate of 78.  Nonspecific ST-T changes.  QTc 456. ? ?Assessment/Plan ?Present on Admission: ? Chest pain ? ?Principal Problem: ?  Chest pain ? ?Chest pain, rule out ACS. ?Presented with chest pain, sudden onset  while he was driving. ?Took a nitroglycerin with some improvement.  Chest pain improved in the ED. ?High-sensitivity troponin negative x2. ?No evidence of acute ischemia on 12 EKG. ?Repeat 12 EKG in the morning ?Obtain

## 2022-01-15 NOTE — Progress Notes (Signed)
Patient seen and examined. HPI reviewed. He presents complaining of chest pain. Left side, sharp in quality. Having chest pain for last week.  ? ?1-Chest pain;  ?Concern for angina.  ?Prior History CAD>  ?Troponin negative.  ?Cardiology consulted.  ?ECHO order. Global Hypokinesis, EF 45 % ?Check D dimer/ positive check VQ scan.  ? ?2-HFpEF 55 to 65 % ?Resume carvedilol on hydralazine ? ?3-Mild pulmonary edema: ? ?4-AKI on CKD stage IIIb ?Creatinine baseline 2.4 ?Presented with a creatinine of 3.7 ?Check renal ultrasound: normal renal US.  ?Monitor  urine output ?Check UA ? ? ?Mild anion gap metabolic acidosis in the setting of AKI: Continue with bicarb supplements ? ?Anemia of chronic kidney disease: Monitor hemoglobin ? ?LE edema; check doppler.  ? ? ?

## 2022-01-16 DIAGNOSIS — D631 Anemia in chronic kidney disease: Secondary | ICD-10-CM | POA: Diagnosis present

## 2022-01-16 DIAGNOSIS — E785 Hyperlipidemia, unspecified: Secondary | ICD-10-CM | POA: Diagnosis present

## 2022-01-16 DIAGNOSIS — Z89512 Acquired absence of left leg below knee: Secondary | ICD-10-CM | POA: Diagnosis not present

## 2022-01-16 DIAGNOSIS — I160 Hypertensive urgency: Secondary | ICD-10-CM | POA: Diagnosis present

## 2022-01-16 DIAGNOSIS — E669 Obesity, unspecified: Secondary | ICD-10-CM | POA: Diagnosis present

## 2022-01-16 DIAGNOSIS — F419 Anxiety disorder, unspecified: Secondary | ICD-10-CM | POA: Diagnosis present

## 2022-01-16 DIAGNOSIS — I5020 Unspecified systolic (congestive) heart failure: Secondary | ICD-10-CM | POA: Diagnosis not present

## 2022-01-16 DIAGNOSIS — G4733 Obstructive sleep apnea (adult) (pediatric): Secondary | ICD-10-CM | POA: Diagnosis present

## 2022-01-16 DIAGNOSIS — E1122 Type 2 diabetes mellitus with diabetic chronic kidney disease: Secondary | ICD-10-CM | POA: Diagnosis present

## 2022-01-16 DIAGNOSIS — E739 Lactose intolerance, unspecified: Secondary | ICD-10-CM | POA: Diagnosis present

## 2022-01-16 DIAGNOSIS — E119 Type 2 diabetes mellitus without complications: Secondary | ICD-10-CM | POA: Diagnosis not present

## 2022-01-16 DIAGNOSIS — I161 Hypertensive emergency: Secondary | ICD-10-CM | POA: Diagnosis not present

## 2022-01-16 DIAGNOSIS — I130 Hypertensive heart and chronic kidney disease with heart failure and stage 1 through stage 4 chronic kidney disease, or unspecified chronic kidney disease: Secondary | ICD-10-CM | POA: Diagnosis present

## 2022-01-16 DIAGNOSIS — I25118 Atherosclerotic heart disease of native coronary artery with other forms of angina pectoris: Secondary | ICD-10-CM | POA: Diagnosis not present

## 2022-01-16 DIAGNOSIS — K219 Gastro-esophageal reflux disease without esophagitis: Secondary | ICD-10-CM | POA: Diagnosis present

## 2022-01-16 DIAGNOSIS — F32A Depression, unspecified: Secondary | ICD-10-CM | POA: Diagnosis present

## 2022-01-16 DIAGNOSIS — N1832 Chronic kidney disease, stage 3b: Secondary | ICD-10-CM | POA: Diagnosis present

## 2022-01-16 DIAGNOSIS — I25119 Atherosclerotic heart disease of native coronary artery with unspecified angina pectoris: Secondary | ICD-10-CM | POA: Diagnosis present

## 2022-01-16 DIAGNOSIS — E872 Acidosis, unspecified: Secondary | ICD-10-CM | POA: Diagnosis present

## 2022-01-16 DIAGNOSIS — E8809 Other disorders of plasma-protein metabolism, not elsewhere classified: Secondary | ICD-10-CM | POA: Diagnosis present

## 2022-01-16 DIAGNOSIS — N179 Acute kidney failure, unspecified: Secondary | ICD-10-CM | POA: Diagnosis present

## 2022-01-16 DIAGNOSIS — Z993 Dependence on wheelchair: Secondary | ICD-10-CM | POA: Diagnosis not present

## 2022-01-16 DIAGNOSIS — E875 Hyperkalemia: Secondary | ICD-10-CM | POA: Diagnosis not present

## 2022-01-16 DIAGNOSIS — R079 Chest pain, unspecified: Secondary | ICD-10-CM | POA: Diagnosis present

## 2022-01-16 DIAGNOSIS — E1151 Type 2 diabetes mellitus with diabetic peripheral angiopathy without gangrene: Secondary | ICD-10-CM | POA: Diagnosis present

## 2022-01-16 DIAGNOSIS — Z794 Long term (current) use of insulin: Secondary | ICD-10-CM | POA: Diagnosis not present

## 2022-01-16 DIAGNOSIS — I5043 Acute on chronic combined systolic (congestive) and diastolic (congestive) heart failure: Secondary | ICD-10-CM | POA: Diagnosis present

## 2022-01-16 DIAGNOSIS — E1165 Type 2 diabetes mellitus with hyperglycemia: Secondary | ICD-10-CM | POA: Diagnosis present

## 2022-01-16 DIAGNOSIS — Z888 Allergy status to other drugs, medicaments and biological substances status: Secondary | ICD-10-CM | POA: Diagnosis not present

## 2022-01-16 LAB — CBC WITH DIFFERENTIAL/PLATELET
Abs Immature Granulocytes: 0.02 10*3/uL (ref 0.00–0.07)
Basophils Absolute: 0 10*3/uL (ref 0.0–0.1)
Basophils Relative: 0 %
Eosinophils Absolute: 0.4 10*3/uL (ref 0.0–0.5)
Eosinophils Relative: 4 %
HCT: 29 % — ABNORMAL LOW (ref 39.0–52.0)
Hemoglobin: 9 g/dL — ABNORMAL LOW (ref 13.0–17.0)
Immature Granulocytes: 0 %
Lymphocytes Relative: 19 %
Lymphs Abs: 1.7 10*3/uL (ref 0.7–4.0)
MCH: 30.1 pg (ref 26.0–34.0)
MCHC: 31 g/dL (ref 30.0–36.0)
MCV: 97 fL (ref 80.0–100.0)
Monocytes Absolute: 0.6 10*3/uL (ref 0.1–1.0)
Monocytes Relative: 6 %
Neutro Abs: 6.6 10*3/uL (ref 1.7–7.7)
Neutrophils Relative %: 71 %
Platelets: 183 10*3/uL (ref 150–400)
RBC: 2.99 MIL/uL — ABNORMAL LOW (ref 4.22–5.81)
RDW: 12.9 % (ref 11.5–15.5)
WBC: 9.3 10*3/uL (ref 4.0–10.5)
nRBC: 0 % (ref 0.0–0.2)

## 2022-01-16 LAB — COMPREHENSIVE METABOLIC PANEL
ALT: 11 U/L (ref 0–44)
AST: 10 U/L — ABNORMAL LOW (ref 15–41)
Albumin: 2.8 g/dL — ABNORMAL LOW (ref 3.5–5.0)
Alkaline Phosphatase: 70 U/L (ref 38–126)
Anion gap: 9 (ref 5–15)
BUN: 47 mg/dL — ABNORMAL HIGH (ref 6–20)
CO2: 21 mmol/L — ABNORMAL LOW (ref 22–32)
Calcium: 7.4 mg/dL — ABNORMAL LOW (ref 8.9–10.3)
Chloride: 114 mmol/L — ABNORMAL HIGH (ref 98–111)
Creatinine, Ser: 4.07 mg/dL — ABNORMAL HIGH (ref 0.61–1.24)
GFR, Estimated: 17 mL/min — ABNORMAL LOW (ref >60)
Glucose, Bld: 162 mg/dL — ABNORMAL HIGH (ref 70–99)
Potassium: 4.9 mmol/L (ref 3.5–5.1)
Sodium: 144 mmol/L (ref 135–145)
Total Bilirubin: 0.3 mg/dL (ref 0.3–1.2)
Total Protein: 6.5 g/dL (ref 6.5–8.1)

## 2022-01-16 LAB — GLUCOSE, CAPILLARY
Glucose-Capillary: 149 mg/dL — ABNORMAL HIGH (ref 70–99)
Glucose-Capillary: 187 mg/dL — ABNORMAL HIGH (ref 70–99)
Glucose-Capillary: 143 mg/dL — ABNORMAL HIGH (ref 70–99)
Glucose-Capillary: 130 mg/dL — ABNORMAL HIGH (ref 70–99)

## 2022-01-16 LAB — PHOSPHORUS: Phosphorus: 5.7 mg/dL — ABNORMAL HIGH (ref 2.5–4.6)

## 2022-01-16 LAB — MAGNESIUM: Magnesium: 1.6 mg/dL — ABNORMAL LOW (ref 1.7–2.4)

## 2022-01-16 LAB — HIV ANTIBODY (ROUTINE TESTING W REFLEX): HIV Screen 4th Generation wRfx: NONREACTIVE

## 2022-01-16 MED ORDER — HYDRALAZINE HCL 50 MG PO TABS
75.0000 mg | ORAL_TABLET | Freq: Three times a day (TID) | ORAL | Status: DC
  Administered 2022-01-16 (×2): 75 mg via ORAL
  Filled 2022-01-16 (×2): qty 1

## 2022-01-16 MED ORDER — HYDRALAZINE HCL 50 MG PO TABS: 50.0000 mg | ORAL_TABLET | Freq: Three times a day (TID) | ORAL | Status: DC

## 2022-01-16 MED ORDER — ASPIRIN EC 81 MG PO TBEC
81.0000 mg | DELAYED_RELEASE_TABLET | Freq: Every day | ORAL | Status: DC
Start: 2022-01-16 — End: 2022-01-20
  Administered 2022-01-16 – 2022-01-20 (×5): 81 mg via ORAL
  Filled 2022-01-16 (×5): qty 1

## 2022-01-16 MED ORDER — HYDROCODONE-ACETAMINOPHEN 5-325 MG PO TABS
1.0000 | ORAL_TABLET | Freq: Four times a day (QID) | ORAL | Status: DC | PRN
  Administered 2022-01-16 – 2022-01-20 (×9): 1 via ORAL
  Filled 2022-01-16 (×9): qty 1

## 2022-01-16 MED ORDER — MAGNESIUM SULFATE 2 GM/50ML IV SOLN
2.0000 g | Freq: Once | INTRAVENOUS | Status: AC
  Administered 2022-01-16: 2 g via INTRAVENOUS
  Filled 2022-01-16: qty 50

## 2022-01-16 MED ORDER — ENOXAPARIN SODIUM 30 MG/0.3ML IJ SOSY
30.0000 mg | PREFILLED_SYRINGE | INTRAMUSCULAR | Status: DC
Start: 2022-01-17 — End: 2022-01-20
  Administered 2022-01-17 – 2022-01-20 (×4): 30 mg via SUBCUTANEOUS
  Filled 2022-01-16 (×4): qty 0.3

## 2022-01-16 NOTE — Progress Notes (Addendum)
? ?Progress Note ? ?Patient Name: Howard Mcmahon ?Date of Encounter: 01/16/2022 ? ?[facility] Cardiologist: Werner Lean, MD  ? ?Subjective  ? ?Patient states he is no longer having any chest pain. He does not check his BP at home, drinks a lot of water and consumes salty diet. He is not sure why he was not placed back on aspirin or Plavix after his foot amputation in Jan 2023. He states he is urinating well.  ? ?Inpatient Medications  ?  ?Scheduled Meds: ? aspirin EC  81 mg Oral Daily  ? atorvastatin  80 mg Oral Daily  ? carvedilol  25 mg Oral BID WC  ? [START ON 01/17/2022] enoxaparin (LOVENOX) injection  30 mg Subcutaneous Q24H  ? hydrALAZINE  50 mg Oral TID  ? insulin aspart  0-15 Units Subcutaneous TID WC  ? insulin aspart  0-5 Units Subcutaneous QHS  ? ipratropium-albuterol  3 mL Nebulization TID  ? isosorbide mononitrate  60 mg Oral Daily  ? pantoprazole  40 mg Oral BID  ? technetium albumin aggregated  4.3 millicurie Intravenous Once  ? ?Continuous Infusions: ? ?PRN Meds: ?nitroGLYCERIN  ? ?Vital Signs  ?  ?Vitals:  ? 01/15/22 2153 01/16/22 0149 01/16/22 0350 01/16/22 0729  ?BP: (!) 175/93 (!) 150/81 (!) 160/77   ?Pulse: 76 73 76   ?Resp: 20 18 20    ?Temp: 98.2 ?F (36.8 ?C) 97.9 ?F (36.6 ?C) 98 ?F (36.7 ?C)   ?TempSrc: Oral Oral Oral   ?SpO2: 94% 100% 98% 98%  ?Weight:      ?Height:      ? ? ?Intake/Output Summary (Last 24 hours) at 01/16/2022 1044 ?Last data filed at 01/16/2022 0700 ?Gross per 24 hour  ?Intake 646.21 ml  ?Output 2050 ml  ?Net -1403.79 ml  ? ? ?  01/15/2022  ? 12:02 AM 11/03/2021  ? 11:53 AM 05/21/2021  ?  5:39 PM  ?Last 3 Weights  ?Weight (lbs) 230 lb 271 lb 2.7 oz 271 lb  ?Weight (kg) 104.327 kg 123 kg 122.925 kg  ?   ? ?Telemetry  ?  ?Sinus rhythm  - Personally Reviewed ? ?ECG  ?  ?No new tracing today - Personally Reviewed ? ?Physical Exam  ? ?GEN: No acute distress.   ?Neck: No JVD ?Cardiac: RRR, no murmurs, rubs, or gallops.  ?Respiratory: Clear to auscultation bilaterally. On room  air ?GI: Soft, nontender ?MS: Left BKA, RLE no edema ?Neuro:  Nonfocal  ?Psych: Normal affect, limited insight  ? ?Labs  ?  ?High Sensitivity Troponin:   ?Recent Labs  ?Lab 01/15/22 ?0001 01/15/22 ?0147  ?TROPONINIHS 10 9  ?   ?Chemistry ?Recent Labs  ?Lab 01/15/22 ?0001 01/15/22 ?1157 01/16/22 ?0415  ?NA 142 144 144  ?K 4.7 4.9 4.9  ?CL 114* 116* 114*  ?CO2 20* 22 21*  ?GLUCOSE 250* 141* 162*  ?BUN 41* 43* 47*  ?CREATININE 3.77* 3.66* 4.07*  ?CALCIUM 7.3* 7.6* 7.4*  ?MG  --   --  1.6*  ?PROT  --   --  6.5  ?ALBUMIN  --   --  2.8*  ?AST  --   --  10*  ?ALT  --   --  11  ?ALKPHOS  --   --  70  ?BILITOT  --   --  0.3  ?GFRNONAA 19* 19* 17*  ?ANIONGAP 8 6 9   ?  ?Lipids No results for input(s): CHOL, TRIG, HDL, LABVLDL, LDLCALC, CHOLHDL in the last 168 hours.  ?Hematology ?  Recent Labs  ?Lab 01/15/22 ?0001 01/16/22 ?0415  ?WBC 9.8 9.3  ?RBC 3.18* 2.99*  ?HGB 9.7* 9.0*  ?HCT 30.5* 29.0*  ?MCV 95.9 97.0  ?MCH 30.5 30.1  ?MCHC 31.8 31.0  ?RDW 13.1 12.9  ?PLT 184 183  ? ?Thyroid No results for input(s): TSH, FREET4 in the last 168 hours.  ?BNP ?Recent Labs  ?Lab 01/15/22 ?0147  ?BNP 225.1*  ?  ?DDimer  ?Recent Labs  ?Lab 01/15/22 ?0100  ?DDIMER 1.33*  ?  ? ?Radiology  ?  ?DG Chest 2 View ? ?Result Date: 01/14/2022 ?CLINICAL DATA:  Chest pain.  Left-sided pain for 1 week. EXAM: CHEST - 2 VIEW COMPARISON:  01/07/2021 FINDINGS: Post median sternotomy. Stable heart size and mediastinal contours. There is diffuse peribronchial thickening that is increased from prior exam, possible mild septal thickening. Confluent consolidation, pleural effusion, or pneumothorax. Thoracic spondylosis IMPRESSION: Diffuse peribronchial thickening that is increased from prior exam, possible mild pulmonary edema superimposed on chronic bronchial thickening. Electronically Signed   By: Keith Rake M.D.   On: 01/14/2022 23:57  ? ?NM Pulmonary Perfusion ? ?Result Date: 01/15/2022 ?CLINICAL DATA:  Chest pain EXAM: NUCLEAR MEDICINE PERFUSION LUNG SCAN  TECHNIQUE: Perfusion images were obtained in multiple projections after intravenous injection of radiopharmaceutical. Ventilation scans intentionally deferred if perfusion scan and chest x-ray adequate for interpretation during COVID 19 epidemic. RADIOPHARMACEUTICALS:  4.3 mCi Tc-55m MAA IV COMPARISON:  Chest radiograph done on 01/14/2022 FINDINGS: No focal wedge-shaped perfusion defects are seen. IMPRESSION: Normal perfusion lung scan. There are no wedge-shaped perfusion defects. Electronically Signed   By: Elmer Picker M.D.   On: 01/15/2022 18:50  ? ?US RENAL ? ?Result Date: 01/15/2022 ?CLINICAL DATA:  Acute kidney injury EXAM: RENAL / URINARY TRACT ULTRASOUND COMPLETE COMPARISON:  CT abdomen 09/11/2021 FINDINGS: Right Kidney: Renal measurements: 12.7 x 4.5 x 6.3 cm = volume: 187.8 mL. Echogenicity within normal limits. No mass or hydronephrosis visualized. Left Kidney: Renal measurements: 11.1 x 5.3 x 5.6 cm = volume: 172.3 mL. Echogenicity within normal limits. No mass or hydronephrosis visualized. Bladder: Appears normal for degree of bladder distention. Other: None. IMPRESSION: Normal renal ultrasound. Electronically Signed   By: Kathreen Devoid M.D.   On: 01/15/2022 10:07  ? ?ECHOCARDIOGRAM COMPLETE ? ?Result Date: 01/15/2022 ?   ECHOCARDIOGRAM REPORT   Patient Name:   Howard Mcmahon Date of Exam: 01/15/2022 Medical Rec #:  [id number]   Height:       76.0 in Accession #:    [id number]  Weight:       230.0 lb Date of Birth:  [birth year]   BSA:          2.351 m? Patient Age:    51 years    BP:           179/107 mmHg Patient Gender: M           HR:           73 bpm. Exam Location:  Inpatient Procedure: 2D Echo, 3D Echo, Cardiac Doppler, Color Doppler and Intracardiac            Opacification Agent Indications:    R07.9* Chest pain, unspecified  History:        Patient has prior history of Echocardiogram examinations, most                 recent 08/26/2014. CAD, Abnormal ECG, Arrythmias:SVT,  Signs/Symptoms:Fever; Risk Factors:Hypertension, Diabetes and                 Dyslipidemia.  Sonographer:    Roseanna Rainbow RDCS Referring Phys: [id number] [place]  Sonographer Comments: Technically difficult study due to poor echo windows and patient is morbidly obese. Image acquisition challenging due to patient body habitus. IMPRESSIONS  1. Left ventricular ejection fraction, by estimation, is 40 to 45%. The left ventricle has mildly decreased function. The left ventricle demonstrates global hypokinesis. There is moderate left ventricular hypertrophy. Left ventricular diastolic parameters are consistent with Grade I diastolic dysfunction (impaired relaxation).  2. Right ventricular systolic function is normal. The right ventricular size is normal. There is normal pulmonary artery systolic pressure.  3. The mitral valve is normal in structure. Trivial mitral valve regurgitation. No evidence of mitral stenosis.  4. The aortic valve is normal in structure. Aortic valve regurgitation is not visualized. No aortic stenosis is present.  5. The inferior vena cava is normal in size with greater than 50% respiratory variability, suggesting right atrial pressure of 3 mmHg. FINDINGS  Left Ventricle: Left ventricular ejection fraction, by estimation, is 40 to 45%. The left ventricle has mildly decreased function. The left ventricle demonstrates global hypokinesis. Definity contrast agent was given IV to delineate the left ventricular  endocardial borders. The left ventricular internal cavity size was normal in size. There is moderate left ventricular hypertrophy. Left ventricular diastolic parameters are consistent with Grade I diastolic dysfunction (impaired relaxation). Right Ventricle: The right ventricular size is normal. No increase in right ventricular wall thickness. Right ventricular systolic function is normal. There is normal pulmonary artery systolic pressure. The tricuspid regurgitant velocity is 2.42 m/s, and  with  an assumed right atrial pressure of 3 mmHg, the estimated right ventricular systolic pressure is 07.3 mmHg. Left Atrium: Left atrial size was normal in size. Right Atrium: Right atrial size was normal in size. [place]

## 2022-01-16 NOTE — Progress Notes (Signed)
?PROGRESS NOTE ? ? ? ?Howard Mcmahon  BOF:[id number] DOB: Feb 29, 1971 DOA: 01/14/2022 ?PCP: [place], [place]  ? ?Brief Narrative: ?51 year old with past medical history significant for diabetes type 2, peripheral vascular disease a status post left BKA, OSA, chronic anxiety depression,  hyperlipidemia who presents complaining of chest pain, associated with shortness of breath troponin x2 negative.  He reports having pain on and off for the last week..  VQ scan was negative for PE.  Cardiology was consulted due to concern for edema.  She was a started on amiodarone. ? ?On admission he was noted to have worsening of his renal function, creatinine today increased to 4.  Nephrology was consulted. ? ? ?Assessment & Plan: ?  ?Principal Problem: ?  Chest pain ? ?1-Chest pain; CAD, History of CABG:  ?Concern for angina. In the setting of HTN urgency.  ?Prior History CAD>  ?Troponin negative.  ?Cardiology consulted.  ?ECHO order. Global Hypokinesis, EF 45 % ?VQ scan negative for PE.  ?He was started on Imdur by cardiology.  ?Chest pain free.  ?He was also started on aspirin.  ?Plan for medical treatment.  ? ?2-HFpEF 55 to 65 % ?Continue with  carvedilol on hydralazine ?  ?3-Mild pulmonary edema vs Bronchitis on  x ray.  ?Diuresis on hold due to AKI.  ? Denies dyspnea.  ? ?4-AKI on CKD stage IIIb ?Creatinine baseline 2.4 ?Presented with a creatinine of 3.7 ?Renal ultrasound: normal renal US.  ?Monitor  urine output ?Nephrology consulted. Recommend monitor for now.  ?  ?  ?Mild anion gap metabolic acidosis in the setting of AKI: Continue with bicarb supplements ?  ?Anemia of chronic kidney disease: Monitor hemoglobin ?  ?LE edema; Doppler negative.  ?  ?  ? ?Estimated body mass index is 28 kg/m? as calculated from the following: ?  Height as of this encounter: 6\' 4"  (1.93 m). ?  Weight as of this encounter: 104.3 kg. ? ? ?DVT prophylaxis: Lovenox ?Code Status: Full code ?Family Communication: Care discussed with patient.   ?Disposition Plan:  ?Status is: Inpatient ?Remains inpatient appropriate because: management of AKI, angina ? ? ? ?Consultants:  ?Cardiology ?Nephrology  ? ?Procedures:  ?ECHO ?Renal US ? ?Antimicrobials:  ? ? ?Subjective: ?He is chest pain free. Denies dyspnea.  ? ? ?Objective: ?Vitals:  ? 01/16/22 0639 01/16/22 0729 01/16/22 1258 01/16/22 1416  ?BP: (!) 160/77  (!) 147/86   ?Pulse: 76  72   ?Resp: 20  16 20   ?Temp: 98 ?F (36.7 ?C)  98.7 ?F (37.1 ?C)   ?TempSrc: Oral  Oral   ?SpO2: 98% 98% 96% 96%  ?Weight:      ?Height:      ? ? ?Intake/Output Summary (Last 24 hours) at 01/16/2022 1559 ?Last data filed at 01/16/2022 1500 ?Gross per 24 hour  ?Intake 766.21 ml  ?Output 2800 ml  ?Net -2033.79 ml  ? ?Filed Weights  ? 01/15/22 0002  ?Weight: 104.3 kg  ? ? ?Examination: ? ?General exam: Appears calm and comfortable  ?Respiratory system: Clear to auscultation. Respiratory effort normal. ?Cardiovascular system: S1 & S2 heard, RRR. No JVD, murmurs, rubs, gallops or clicks. No pedal edema. ?Gastrointestinal system: Abdomen is nondistended, soft and nontender. No organomegaly or masses felt. Normal bowel sounds heard. ?Central nervous system: Alert and oriented. No focal neurological deficits. ?Extremities: Left BKA.  ? ? ?Data Reviewed: I have personally reviewed following labs and imaging studies ? ?CBC: ?Recent Labs  ?Lab 01/15/22 ?0001 01/16/22 ?0415  ?WBC 9.8  9.3  ?NEUTROABS  --  6.6  ?HGB 9.7* 9.0*  ?HCT 30.5* 29.0*  ?MCV 95.9 97.0  ?PLT 184 183  ? ?Basic Metabolic Panel: ?Recent Labs  ?Lab 01/15/22 ?0001 01/15/22 ?1157 01/16/22 ?0415  ?NA 142 144 144  ?K 4.7 4.9 4.9  ?CL 114* 116* 114*  ?CO2 20* 22 21*  ?GLUCOSE 250* 141* 162*  ?BUN 41* 43* 47*  ?CREATININE 3.77* 3.66* 4.07*  ?CALCIUM 7.3* 7.6* 7.4*  ?MG  --   --  1.6*  ?PHOS  --   --  5.7*  ? ?GFR: ?Estimated Creatinine Clearance: 29.1 mL/min (A) (by C-G formula based on SCr of 4.07 mg/dL (H)). ?Liver Function Tests: ?Recent Labs  ?Lab 01/16/22 ?3151  ?AST 10*  ?ALT 11   ?ALKPHOS 70  ?BILITOT 0.3  ?PROT 6.5  ?ALBUMIN 2.8*  ? ?No results for input(s): LIPASE, AMYLASE in the last 168 hours. ?No results for input(s): AMMONIA in the last 168 hours. ?Coagulation Profile: ?No results for input(s): INR, PROTIME in the last 168 hours. ?Cardiac Enzymes: ?No results for input(s): CKTOTAL, CKMB, CKMBINDEX, TROPONINI in the last 168 hours. ?BNP (last 3 results) ?No results for input(s): PROBNP in the last 8760 hours. ?HbA1C: ?Recent Labs  ?  01/15/22 ?7616  ?HGBA1C 6.0*  ? ?CBG: ?Recent Labs  ?Lab 01/15/22 ?1325 01/15/22 ?1613 01/15/22 ?2154 01/16/22 ?0732 01/16/22 ?1128  ?GLUCAP 120* 129* 125* 149* 187*  ? ?Lipid Profile: ?No results for input(s): CHOL, HDL, LDLCALC, TRIG, CHOLHDL, LDLDIRECT in the last 72 hours. ?Thyroid Function Tests: ?No results for input(s): TSH, T4TOTAL, FREET4, T3FREE, THYROIDAB in the last 72 hours. ?Anemia Panel: ?Recent Labs  ?  01/15/22 ?0737  ?FERRITIN 155  ?TIBC 214*  ?IRON 46  ? ?Sepsis Labs: ?No results for input(s): PROCALCITON, LATICACIDVEN in the last 168 hours. ? ?No results found for this or any previous visit (from the past 240 hour(s)).  ? ? ? ? ? ?Radiology Studies: ?DG Chest 2 View ? ?Result Date: 01/14/2022 ?CLINICAL DATA:  Chest pain.  Left-sided pain for 1 week. EXAM: CHEST - 2 VIEW COMPARISON:  01/07/2021 FINDINGS: Post median sternotomy. Stable heart size and mediastinal contours. There is diffuse peribronchial thickening that is increased from prior exam, possible mild septal thickening. Confluent consolidation, pleural effusion, or pneumothorax. Thoracic spondylosis IMPRESSION: Diffuse peribronchial thickening that is increased from prior exam, possible mild pulmonary edema superimposed on chronic bronchial thickening. Electronically Signed   By: Keith Rake M.D.   On: 01/14/2022 23:57  ? ?NM Pulmonary Perfusion ? ?Result Date: 01/15/2022 ?CLINICAL DATA:  Chest pain EXAM: NUCLEAR MEDICINE PERFUSION LUNG SCAN TECHNIQUE: Perfusion images were  obtained in multiple projections after intravenous injection of radiopharmaceutical. Ventilation scans intentionally deferred if perfusion scan and chest x-ray adequate for interpretation during COVID 19 epidemic. RADIOPHARMACEUTICALS:  4.3 mCi Tc-94m MAA IV COMPARISON:  Chest radiograph done on 01/14/2022 FINDINGS: No focal wedge-shaped perfusion defects are seen. IMPRESSION: Normal perfusion lung scan. There are no wedge-shaped perfusion defects. Electronically Signed   By: Elmer Picker M.D.   On: 01/15/2022 18:50  ? ?US RENAL ? ?Result Date: 01/15/2022 ?CLINICAL DATA:  Acute kidney injury EXAM: RENAL / URINARY TRACT ULTRASOUND COMPLETE COMPARISON:  CT abdomen 09/11/2021 FINDINGS: Right Kidney: Renal measurements: 12.7 x 4.5 x 6.3 cm = volume: 187.8 mL. Echogenicity within normal limits. No mass or hydronephrosis visualized. Left Kidney: Renal measurements: 11.1 x 5.3 x 5.6 cm = volume: 172.3 mL. Echogenicity within normal limits. No mass or hydronephrosis visualized. Bladder:  Appears normal for degree of bladder distention. Other: None. IMPRESSION: Normal renal ultrasound. Electronically Signed   By: Kathreen Devoid M.D.   On: 01/15/2022 10:07  ? ?ECHOCARDIOGRAM COMPLETE ? ?Result Date: 01/15/2022 ?   ECHOCARDIOGRAM REPORT   Patient Name:   Howard Mcmahon Date of Exam: 01/15/2022 Medical Rec #:  [id number]   Height:       76.0 in Accession #:    [id number]  Weight:       230.0 lb Date of Birth:  [birth year]   BSA:          2.351 m? Patient Age:    64 years    BP:           179/107 mmHg Patient Gender: M           HR:           73 bpm. Exam Location:  Inpatient Procedure: 2D Echo, 3D Echo, Cardiac Doppler, Color Doppler and Intracardiac            Opacification Agent Indications:    R07.9* Chest pain, unspecified  History:        Patient has prior history of Echocardiogram examinations, most                 recent 08/26/2014. CAD, Abnormal ECG, Arrythmias:SVT,                 Signs/Symptoms:Fever; Risk  Factors:Hypertension, Diabetes and                 Dyslipidemia.  Sonographer:    Roseanna Rainbow RDCS Referring Phys: [id number] [place]  Sonographer Comments: Technically difficult study due to poor echo windows and patient is mo

## 2022-01-16 NOTE — Consult Note (Addendum)
[facility]  ?INPATIENT CONSULTATION ? ?Reason for Consultation: AKI on CKD ?Requesting Provider: Dr. Tyrell Antonio ? ?HPI: Howard Mcmahon is an 51 y.o. male with DM, HTN, PVD with h/o L BKA, OSA, anxiety, depression, CAD s/p CABG 2019, HFpEF, HL currently admitted for CP and nephrology is consulted regarding evaluation and management of AKI on CKD.  ? ?Admitted from ED 5/7 for CP.  CXR with mild pulm edema.  BP 172 initially, 88% ra.   VQ scan neg. Trop cycled neg.  Cardiology consulted and added imdur and plavix.  Suspected  microvascular dz.  Pt reports CP improved. ? ?Cr typcially in the 2.70m/dL range as recently as 10/2021; was 3.8 on arrival and was 4.1 this AM.  5/8 renal [place]L 11.1, R 12.7, normal, no obstruction.  UA + blood, + protein similar to priors. Urine sodium 116.  No iodinated contrast. Denies NSAIDs PTA.  No difficulty voiding.  Denies gross hematuria. Denies orthostatic symptoms or GI issues PTA.  No nephrology care.  Denies fam hx.  Lives with mom.  ? ?PMH: ?Past Medical History:  ?Diagnosis Date  ? Chronic kidney disease   ? Diabetes mellitus without complication ([place]   ? Hypertension   ? Macular infarction   ? 2012  ? [place] infarction) ([place]   ? Peripheral vascular disease ([place]   ? Neuropathy  ? Pneumonia   ? ?PSH: ?Past Surgical History:  ?Procedure Laterality Date  ? CARDIAC SURGERY    ? CORONARY ANGIOPLASTY WITH STENT PLACEMENT    ? CORONARY ARTERY BYPASS GRAFT    ? HIP ARTHROPLASTY Right   ? ? ?Past Medical History:  ?Diagnosis Date  ? Chronic kidney disease   ? Diabetes mellitus without complication ([place]   ? Hypertension   ? Macular infarction   ? 2012  ? [place] (myocardial infarction) ([place]   ? Peripheral vascular disease ([place]   ? Neuropathy  ? Pneumonia   ? ? ?Medications: ? I have reviewed the patient's current medications. ? ?Medications Prior to Admission  ?Medication Sig Dispense Refill  ? acetaminophen (TYLENOL) 325 MG tablet Take 2 tablets (650 mg total) by mouth  every 6 (six) hours as needed. (Patient taking differently: Take 650 mg by mouth every 6 (six) hours as needed for mild pain.) 20 tablet 0  ? atorvastatin (LIPITOR) 80 MG tablet Take 80 mg by mouth daily.    ? carvedilol (COREG) 25 MG tablet Take 25 mg by mouth 2 (two) times daily.    ? hydrALAZINE (APRESOLINE) 25 MG tablet Take 25 mg by mouth 3 (three) times daily.    ? Insulin Isophane & Regular Human (NOVOLIN 70/30 FLEXPEN RELION) (70-30) 100 UNIT/ML PEN Inject 18 Units into the skin 2 (two) times daily. (Patient taking differently: Inject 36 Units into the skin 2 (two) times daily.) 15 mL 1  ? nitroGLYCERIN (NITROSTAT) 0.4 MG SL tablet Place 0.4 mg under the tongue every 5 (five) minutes as needed for chest pain.    ? VENTOLIN HFA 108 (90 Base) MCG/ACT inhaler Inhale 1-2 puffs into the lungs daily as needed for wheezing or shortness of breath.    ? Blood Glucose Monitoring Suppl (TRUE METRIX METER) w/Device KIT Use as directed (Patient taking differently: 1 each by Other route as directed.) 1 kit 0  ? glucose blood (TRUE METRIX BLOOD GLUCOSE TEST) test strip Check blood sugar fasting and before meals and again if pt feels bad (symptoms of hypo). Check sugar three times a day  100 each 12  ? Insulin Pen Needle (NOVOFINE) 30G X 8 MM MISC Inject 10 each into the skin as needed. (Patient taking differently: Inject 1 packet into the skin as needed (insulin).) 3 packet 1  ? metFORMIN (GLUCOPHAGE-XR) 500 MG 24 hr tablet Take 1 tablet (500 mg total) by mouth daily with breakfast. (Patient not taking: Reported on 01/07/2021) 30 tablet 1  ? Semaglutide,0.25 or 0.5MG/DOS, 2 MG/1.5ML SOPN Inject 0.25 mg into the skin once a week. (Patient not taking: Reported on 01/15/2022)    ? ?ALLERGIES: ?  ?Allergies  ?Allergen Reactions  ? Lisinopril Swelling  ? Lactose Intolerance (Gi)   ? Other Diarrhea  ?  Lactulose intolerant ?Lactulose intolerant  ? ? ?FAM HX: ?Family History  ?Problem Relation Age of Onset  ? Stroke Mother    ? ? ?Social History:  ? reports that he has quit smoking. His smoking use included cigarettes. He has a 60.00 pack-year smoking history. He uses smokeless tobacco. He reports that he does not drink alcohol and does not use drugs. ? ?ROS: 12 system ROS neg except per HPI above ? ?Blood pressure (!) 160/77, pulse 76, temperature 98 ?F (36.7 ?C), temperature source Oral, resp. rate 20, height _0  (1.93 m), weight 104.3 kg, SpO2 98 %. ?PHYSICAL EXAM: ?Gen: comfortably lying flat in bed  ?Eyes: anicteric ?ENT: MMM ?CV:  RRR, no rub ?Abd:  soft, obese, nontender ?Lungs: normal WOB on RA, no rales ?Extr: 1+ pitting RLE edema, L BKA incision healed, R 2nd digit DIP missing (spider bite) ?Neuro: grossly nonfocal ?Skin: no rashes ?  ?Results for orders placed or performed during the hospital encounter of 01/14/22 (from the past 48 hour(s))  ?Basic metabolic panel     Status: Abnormal  ? Collection Time: 01/15/22 12:01 AM  ?Result Value Ref Range  ? Sodium 142 135 - 145 mmol/L  ? Potassium 4.7 3.5 - 5.1 mmol/L  ? Chloride 114 (H) 98 - 111 mmol/L  ? CO2 20 (L) 22 - 32 mmol/L  ? Glucose, Bld 250 (H) 70 - 99 mg/dL  ?  Comment: Glucose reference range applies only to samples taken after fasting for at least 8 hours.  ? BUN 41 (H) 6 - 20 mg/dL  ? Creatinine, Ser 3.77 (H) 0.61 - 1.24 mg/dL  ? Calcium 7.3 (L) 8.9 - 10.3 mg/dL  ? GFR, Estimated 19 (L) >60 mL/min  ?  Comment: (NOTE) ?Calculated using the CKD-EPI Creatinine Equation (2021) ?  ? Anion gap 8 5 - 15  ?  Comment: Performed at [facility], [place] [address], [place], [place] [address]  ?CBC     Status: Abnormal  ? Collection Time: 01/15/22 12:01 AM  ?Result Value Ref Range  ? WBC 9.8 4.0 - 10.5 K/uL  ? RBC 3.18 (L) 4.22 - 5.81 MIL/uL  ? Hemoglobin 9.7 (L) 13.0 - 17.0 g/dL  ? HCT 30.5 (L) 39.0 - 52.0 %  ? MCV 95.9 80.0 - 100.0 fL  ? MCH 30.5 26.0 - 34.0 pg  ? MCHC 31.8 30.0 - 36.0 g/dL  ? RDW 13.1 11.5 - 15.5 %  ? Platelets 184 150 - 400 K/uL  ? nRBC 0.0  0.0 - 0.2 %  ?  Comment: Performed at [facility], [place] [address], [place], [place] [address]  ?Troponin I (High Sensitivity)     Status: None  ? Collection Time: 01/15/22 12:01 AM  ?Result Value Ref Range  ? Troponin I (High  Sensitivity) 10 <18 ng/L  ?  Comment: (NOTE) ?Elevated high sensitivity troponin I (hsTnI) values and significant  ?changes across serial measurements may suggest ACS but many other  ?chronic and acute conditions are known to elevate hsTnI results.  ?Refer to the "Links" section for chest pain algorithms and additional  ?guidance. ?Performed at [facility], [place] [address] ?[place], [place] [address] ?  ?Troponin I (High Sensitivity)     Status: None  ? Collection Time: 01/15/22  1:47 AM  ?Result Value Ref Range  ? Troponin I (High Sensitivity) 9 <18 ng/L  ?  Comment: (NOTE) ?Elevated high sensitivity troponin I (hsTnI) values and significant  ?changes across serial measurements may suggest ACS but many other  ?chronic and acute conditions are known to elevate hsTnI results.  ?Refer to the "Links" section for chest pain algorithms and additional  ?guidance. ?Performed at [facility], [place] [address] ?[place], [place] [address] ?  ?Brain natriuretic peptide     Status: Abnormal  ? Collection Time: 01/15/22  1:47 AM  ?Result Value Ref Range  ? B Natriuretic Peptide 225.1 (H) 0.0 - 100.0 pg/mL  ?  Comment: Performed at [facility], [place] [address] [place], [place] [address]  ?CBG monitoring, ED     Status: Abnormal  ? Collection Time: 01/15/22  9:16 AM  ?Result Value Ref Range  ? Glucose-Capillary 138 (H) 70 - 99 mg/dL  ?  Comment: Glucose reference range applies only to samples taken after fasting for at least 8 hours.  ?Hemoglobin A1c     Status: Abnormal  ? Collection Time: 01/15/22 11:57 AM  ?Result Value Ref Range  ? Hgb A1c MFr Bld 6.0 (H) 4.8 - 5.6 %  ?  Comment: (NOTE) ?Pre diabetes:           5.7%-6.4% ? ?Diabetes:              >6.4% ? ?Glycemic control for   <7.0% ?adults with diabetes ?  ? Mean Plasma Glucose 125.5 mg/dL  ?  Comment: Performed at [facility], [place] [address] [place],  [address]  ?Iron a

## 2022-01-16 NOTE — Plan of Care (Signed)
Plan of care reviewed and discussed. °

## 2022-01-17 ENCOUNTER — Encounter (HOSPITAL_COMMUNITY): Payer: 59

## 2022-01-17 DIAGNOSIS — N179 Acute kidney failure, unspecified: Secondary | ICD-10-CM

## 2022-01-17 DIAGNOSIS — I5020 Unspecified systolic (congestive) heart failure: Secondary | ICD-10-CM | POA: Diagnosis not present

## 2022-01-17 DIAGNOSIS — I25118 Atherosclerotic heart disease of native coronary artery with other forms of angina pectoris: Secondary | ICD-10-CM | POA: Diagnosis not present

## 2022-01-17 LAB — CBC
HCT: 30.8 % — ABNORMAL LOW (ref 39.0–52.0)
Hemoglobin: 9.3 g/dL — ABNORMAL LOW (ref 13.0–17.0)
MCH: 29.6 pg (ref 26.0–34.0)
MCHC: 30.2 g/dL (ref 30.0–36.0)
MCV: 98.1 fL (ref 80.0–100.0)
Platelets: 179 10*3/uL (ref 150–400)
RBC: 3.14 MIL/uL — ABNORMAL LOW (ref 4.22–5.81)
RDW: 12.8 % (ref 11.5–15.5)
WBC: 8.6 10*3/uL (ref 4.0–10.5)
nRBC: 0 % (ref 0.0–0.2)

## 2022-01-17 LAB — BASIC METABOLIC PANEL
Anion gap: 8 (ref 5–15)
BUN: 52 mg/dL — ABNORMAL HIGH (ref 6–20)
CO2: 22 mmol/L (ref 22–32)
Calcium: 7.8 mg/dL — ABNORMAL LOW (ref 8.9–10.3)
Chloride: 112 mmol/L — ABNORMAL HIGH (ref 98–111)
Creatinine, Ser: 4.74 mg/dL — ABNORMAL HIGH (ref 0.61–1.24)
GFR, Estimated: 14 mL/min — ABNORMAL LOW (ref >60)
Glucose, Bld: 151 mg/dL — ABNORMAL HIGH (ref 70–99)
Potassium: 5.3 mmol/L — ABNORMAL HIGH (ref 3.5–5.1)
Sodium: 142 mmol/L (ref 135–145)

## 2022-01-17 LAB — GLUCOSE, CAPILLARY
Glucose-Capillary: 135 mg/dL — ABNORMAL HIGH (ref 70–99)
Glucose-Capillary: 132 mg/dL — ABNORMAL HIGH (ref 70–99)
Glucose-Capillary: 131 mg/dL — ABNORMAL HIGH (ref 70–99)
Glucose-Capillary: 134 mg/dL — ABNORMAL HIGH (ref 70–99)

## 2022-01-17 MED ORDER — ONDANSETRON HCL 4 MG/2ML IJ SOLN
4.0000 mg | Freq: Four times a day (QID) | INTRAMUSCULAR | Status: DC | PRN
  Administered 2022-01-18 (×2): 4 mg via INTRAVENOUS
  Filled 2022-01-17 (×2): qty 2

## 2022-01-17 MED ORDER — SENNOSIDES-DOCUSATE SODIUM 8.6-50 MG PO TABS
1.0000 | ORAL_TABLET | Freq: Two times a day (BID) | ORAL | Status: DC
  Administered 2022-01-17 – 2022-01-20 (×7): 1 via ORAL
  Filled 2022-01-17 (×7): qty 1

## 2022-01-17 MED ORDER — POLYETHYLENE GLYCOL 3350 17 G PO PACK
17.0000 g | PACK | Freq: Every day | ORAL | Status: DC
  Administered 2022-01-17 – 2022-01-20 (×4): 17 g via ORAL
  Filled 2022-01-17 (×4): qty 1

## 2022-01-17 MED ORDER — HYDRALAZINE HCL 50 MG PO TABS
100.0000 mg | ORAL_TABLET | Freq: Three times a day (TID) | ORAL | Status: DC
Start: 2022-01-17 — End: 2022-01-20
  Administered 2022-01-17 – 2022-01-20 (×9): 100 mg via ORAL
  Filled 2022-01-17 (×9): qty 2

## 2022-01-17 MED ORDER — IPRATROPIUM-ALBUTEROL 0.5-2.5 (3) MG/3ML IN SOLN: 3.0000 mL | Freq: Four times a day (QID) | RESPIRATORY_TRACT | Status: DC | PRN

## 2022-01-17 NOTE — Progress Notes (Signed)
?PROGRESS NOTE ? ? ? ?Howard Mcmahon  ERX:[id number] DOB: 12/21/1970 DOA: 01/14/2022 ?PCP: [place], [place]  ? ?Brief Narrative: ?51 year old with past medical history significant for diabetes type 2, peripheral vascular disease a status post left BKA, OSA, chronic anxiety depression,  hyperlipidemia who presents complaining of chest pain, associated with shortness of breath troponin x2 negative.  He reports having pain on and off for the last week..  VQ scan was negative for PE.  Cardiology was consulted due to concern for edema.  She was a started on amiodarone. ? ?On admission he was noted to have worsening of his renal function, creatinine today increased to 4.  Nephrology was consulted. ? ? ?Assessment & Plan: ?  ?Principal Problem: ?  Chest pain ? ?1-Chest pain; CAD, History of CABG:  ?Concern for angina. In the setting of HTN urgency.  ?Prior History CAD>  ?Troponin negative.  ?Cardiology consulted.  ?ECHO order. Global Hypokinesis, EF 45 % ?VQ scan negative for PE.  ?He was started on Imdur by cardiology.  ?Chest pain free.  ?He was also started on aspirin.  ?Plan for medical treatment.  ? ?2-HFpEF 55 to 65 % ?Continue with  carvedilol on hydralazine ?  ?3-Mild pulmonary edema vs Bronchitis on  x ray.  ?Diuresis on hold due to AKI.  ? Denies dyspnea.  ? ?4-AKI on CKD stage IIIb/hyperkalemia  ?Creatinine baseline 2.4 ?Presented with a creatinine of 3.7 ?Renal ultrasound: normal renal US.  ?Monitor  urine output ?Nephrology consulted, will follow recommendation   ?  ?  ?Mild anion gap metabolic acidosis in the setting of AKI: Continue with bicarb supplements ?  ?Anemia of chronic kidney disease: Monitor hemoglobin ?  ?LE edema; Doppler negative for DVT ?  ?  ? ?Estimated body mass index is 28 kg/m? as calculated from the following: ?  Height as of this encounter: 6\' 4"  (1.93 m). ?  Weight as of this encounter: 104.3 kg. ? ? ?DVT prophylaxis: Lovenox ?Code Status: Full code ?Family Communication: Care discussed  with patient.  ?Disposition Plan:  ?Status is: Inpatient ?Remains inpatient appropriate because: management of AKI ? ? ? ?Consultants:  ?Cardiology ?Nephrology  ? ?Procedures:  ?ECHO ?Renal US ? ?Antimicrobials:  ? ? ?Subjective: ? ?Reports Headache, no chest pain, reports feeling sob this am, currently on room air, denies sob ?Right leg edema for a few months ?Cr continue to increase  ? ? ?Objective: ?Vitals:  ? 01/16/22 2142 01/17/22 0745 01/17/22 0747 01/17/22 1115  ?BP: (!) 160/85   (!) 163/87  ?Pulse: 63   73  ?Resp: 18   16  ?Temp: 98.4 ?F (36.9 ?C)   98.2 ?F (36.8 ?C)  ?TempSrc: Oral   Oral  ?SpO2: 99% 99% 99% 98%  ?Weight:      ?Height:      ? ? ?Intake/Output Summary (Last 24 hours) at 01/17/2022 1902 ?Last data filed at 01/17/2022 1635 ?Gross per 24 hour  ?Intake 960 ml  ?Output 2300 ml  ?Net -1340 ml  ? ?Filed Weights  ? 01/15/22 0002  ?Weight: 104.3 kg  ? ? ?Examination: ? ?General exam: Appears calm and comfortable  ?Respiratory system: Clear to auscultation. Respiratory effort normal. ?Cardiovascular system: S1 & S2 heard, RRR. No JVD, murmurs, rubs, gallops or clicks. No pedal edema. ?Gastrointestinal system: Abdomen is nondistended, soft and nontender. No organomegaly or masses felt. Normal bowel sounds heard. ?Central nervous system: Alert and oriented. No focal neurological deficits. ?Extremities: Left BKA, right lower extremity edema ? ? ?  Data Reviewed: I have personally reviewed following labs and imaging studies ? ?CBC: ?Recent Labs  ?Lab 01/15/22 ?0001 01/16/22 ?0415 01/17/22 ?0410  ?WBC 9.8 9.3 8.6  ?NEUTROABS  --  6.6  --   ?HGB 9.7* 9.0* 9.3*  ?HCT 30.5* 29.0* 30.8*  ?MCV 95.9 97.0 98.1  ?PLT 184 183 179  ? ?Basic Metabolic Panel: ?Recent Labs  ?Lab 01/15/22 ?0001 01/15/22 ?1157 01/16/22 ?0415 01/17/22 ?0410  ?NA 142 144 144 142  ?K 4.7 4.9 4.9 5.3*  ?CL 114* 116* 114* 112*  ?CO2 20* 22 21* 22  ?GLUCOSE 250* 141* 162* 151*  ?BUN 41* 43* 47* 52*  ?CREATININE 3.77* 3.66* 4.07* 4.74*  ?CALCIUM  7.3* 7.6* 7.4* 7.8*  ?MG  --   --  1.6*  --   ?PHOS  --   --  5.7*  --   ? ?GFR: ?Estimated Creatinine Clearance: 25 mL/min (A) (by C-G formula based on SCr of 4.74 mg/dL (H)). ?Liver Function Tests: ?Recent Labs  ?Lab 01/16/22 ?1610  ?AST 10*  ?ALT 11  ?ALKPHOS 70  ?BILITOT 0.3  ?PROT 6.5  ?ALBUMIN 2.8*  ? ?No results for input(s): LIPASE, AMYLASE in the last 168 hours. ?No results for input(s): AMMONIA in the last 168 hours. ?Coagulation Profile: ?No results for input(s): INR, PROTIME in the last 168 hours. ?Cardiac Enzymes: ?No results for input(s): CKTOTAL, CKMB, CKMBINDEX, TROPONINI in the last 168 hours. ?BNP (last 3 results) ?No results for input(s): PROBNP in the last 8760 hours. ?HbA1C: ?Recent Labs  ?  01/15/22 ?9604  ?HGBA1C 6.0*  ? ?CBG: ?Recent Labs  ?Lab 01/16/22 ?1603 01/16/22 ?2144 01/17/22 ?0732 01/17/22 ?1112 01/17/22 ?1605  ?GLUCAP 143* 130* 135* 132* 131*  ? ?Lipid Profile: ?No results for input(s): CHOL, HDL, LDLCALC, TRIG, CHOLHDL, LDLDIRECT in the last 72 hours. ?Thyroid Function Tests: ?No results for input(s): TSH, T4TOTAL, FREET4, T3FREE, THYROIDAB in the last 72 hours. ?Anemia Panel: ?Recent Labs  ?  01/15/22 ?5409  ?FERRITIN 155  ?TIBC 214*  ?IRON 46  ? ?Sepsis Labs: ?No results for input(s): PROCALCITON, LATICACIDVEN in the last 168 hours. ? ?No results found for this or any previous visit (from the past 240 hour(s)).  ? ? ? ? ? ?Radiology Studies: ?No results found. ? ? ? ? ? ?Scheduled Meds: ? aspirin EC  81 mg Oral Daily  ? atorvastatin  80 mg Oral Daily  ? carvedilol  25 mg Oral BID WC  ? enoxaparin (LOVENOX) injection  30 mg Subcutaneous Q24H  ? hydrALAZINE  100 mg Oral TID  ? insulin aspart  0-15 Units Subcutaneous TID WC  ? insulin aspart  0-5 Units Subcutaneous QHS  ? isosorbide mononitrate  60 mg Oral Daily  ? pantoprazole  40 mg Oral BID  ? polyethylene glycol  17 g Oral Daily  ? senna-docusate  1 tablet Oral BID  ? technetium albumin aggregated  4.3 millicurie Intravenous Once   ? ?Continuous Infusions: ? ? LOS: 1 day  ? ? ?Time spent: 35 minutes.  ? ? ? ?Florencia Reasons, MD PhD FACP ?[facility] ? ? ?If 7PM-7AM, please contact night-coverage ?[email or web address] ? ?01/17/2022, 7:02 PM   ?

## 2022-01-17 NOTE — Progress Notes (Signed)
? ?Progress Note ? ?Patient Name: Howard Mcmahon ?Date of Encounter: 01/17/2022 ? ?[facility] Cardiologist: Werner Lean, MD  ? ?Subjective  ? ?No CP. ?Worried about headaches. ?Clarifies prior subjective:  he has headaches that predate his nitro and Imdur use.  No change or worsening on nitrates. ? ?Inpatient Medications  ?  ?Scheduled Meds: ? aspirin EC  81 mg Oral Daily  ? atorvastatin  80 mg Oral Daily  ? carvedilol  25 mg Oral BID WC  ? enoxaparin (LOVENOX) injection  30 mg Subcutaneous Q24H  ? hydrALAZINE  75 mg Oral TID  ? insulin aspart  0-15 Units Subcutaneous TID WC  ? insulin aspart  0-5 Units Subcutaneous QHS  ? isosorbide mononitrate  60 mg Oral Daily  ? pantoprazole  40 mg Oral BID  ? technetium albumin aggregated  4.3 millicurie Intravenous Once  ? ?Continuous Infusions: ? ?PRN Meds: ?HYDROcodone-acetaminophen, ipratropium-albuterol, nitroGLYCERIN  ? ?Vital Signs  ?  ?Vitals:  ? 01/16/22 2042 01/16/22 2142 01/17/22 0745 01/17/22 0747  ?BP:  (!) 160/85    ?Pulse:  63    ?Resp:  18    ?Temp:  98.4 ?F (36.9 ?C)    ?TempSrc:  Oral    ?SpO2: 94% 99% 99% 99%  ?Weight:      ?Height:      ? ? ?Intake/Output Summary (Last 24 hours) at 01/17/2022 0901 ?Last data filed at 01/17/2022 0500 ?Gross per 24 hour  ?Intake 1320 ml  ?Output 2050 ml  ?Net -730 ml  ? ? ?  01/15/2022  ? 12:02 AM 11/03/2021  ? 11:53 AM 05/21/2021  ?  5:39 PM  ?Last 3 Weights  ?Weight (lbs) 230 lb 271 lb 2.7 oz 271 lb  ?Weight (kg) 104.327 kg 123 kg 122.925 kg  ?   ? ?Telemetry  ?  ?Off Telemetry ? ?Physical Exam  ? ?GEN: No acute distress.   ?Neck: No JVD ?Cardiac: RRR, no murmurs, rubs, or gallops.  ?Respiratory: Clear to auscultation bilaterally. On room air ?GI: Soft, nontender ?MS: Left BKA, RLE no edema ?Neuro:  Nonfocal  ?Psych: Normal affect ? ?Labs  ?  ?High Sensitivity Troponin:   ?Recent Labs  ?Lab 01/15/22 ?0001 01/15/22 ?0147  ?TROPONINIHS 10 9  ?   ?Chemistry ?Recent Labs  ?Lab 01/15/22 ?1157 01/16/22 ?0415 01/17/22 ?0410   ?NA 144 144 142  ?K 4.9 4.9 5.3*  ?CL 116* 114* 112*  ?CO2 22 21* 22  ?GLUCOSE 141* 162* 151*  ?BUN 43* 47* 52*  ?CREATININE 3.66* 4.07* 4.74*  ?CALCIUM 7.6* 7.4* 7.8*  ?MG  --  1.6*  --   ?PROT  --  6.5  --   ?ALBUMIN  --  2.8*  --   ?AST  --  10*  --   ?ALT  --  11  --   ?ALKPHOS  --  22  --   ?BILITOT  --  0.3  --   ?GFRNONAA 19* 17* 14*  ?ANIONGAP 6 9 8   ?  ?Lipids No results for input(s): CHOL, TRIG, HDL, LABVLDL, LDLCALC, CHOLHDL in the last 168 hours.  ?Hematology ?Recent Labs  ?Lab 01/15/22 ?0001 01/16/22 ?0415 01/17/22 ?0410  ?WBC 9.8 9.3 8.6  ?RBC 3.18* 2.99* 3.14*  ?HGB 9.7* 9.0* 9.3*  ?HCT 30.5* 29.0* 30.8*  ?MCV 95.9 97.0 98.1  ?MCH 30.5 30.1 29.6  ?MCHC 31.8 31.0 30.2  ?RDW 13.1 12.9 12.8  ?PLT 184 183 179  ? ?Thyroid No results for input(s): TSH, FREET4 in  the last 168 hours.  ?BNP ?Recent Labs  ?Lab 01/15/22 ?0147  ?BNP 225.1*  ?  ?DDimer  ?Recent Labs  ?Lab 01/15/22 ?5009  ?DDIMER 1.33*  ?  ? ?Radiology  ?  ?NM Pulmonary Perfusion ? ?Result Date: 01/15/2022 ?CLINICAL DATA:  Chest pain EXAM: NUCLEAR MEDICINE PERFUSION LUNG SCAN TECHNIQUE: Perfusion images were obtained in multiple projections after intravenous injection of radiopharmaceutical. Ventilation scans intentionally deferred if perfusion scan and chest x-ray adequate for interpretation during COVID 19 epidemic. RADIOPHARMACEUTICALS:  4.3 mCi Tc-47m MAA IV COMPARISON:  Chest radiograph done on 01/14/2022 FINDINGS: No focal wedge-shaped perfusion defects are seen. IMPRESSION: Normal perfusion lung scan. There are no wedge-shaped perfusion defects. Electronically Signed   By: Elmer Picker M.D.   On: 01/15/2022 18:50  ? ?US RENAL ? ?Result Date: 01/15/2022 ?CLINICAL DATA:  Acute kidney injury EXAM: RENAL / URINARY TRACT ULTRASOUND COMPLETE COMPARISON:  CT abdomen 09/11/2021 FINDINGS: Right Kidney: Renal measurements: 12.7 x 4.5 x 6.3 cm = volume: 187.8 mL. Echogenicity within normal limits. No mass or hydronephrosis visualized. Left Kidney:  Renal measurements: 11.1 x 5.3 x 5.6 cm = volume: 172.3 mL. Echogenicity within normal limits. No mass or hydronephrosis visualized. Bladder: Appears normal for degree of bladder distention. Other: None. IMPRESSION: Normal renal ultrasound. Electronically Signed   By: Kathreen Devoid M.D.   On: 01/15/2022 10:07  ? ?ECHOCARDIOGRAM COMPLETE ? ?Result Date: 01/15/2022 ?   ECHOCARDIOGRAM REPORT   Patient Name:   Howard Mcmahon Date of Exam: 01/15/2022 Medical Rec #:  [id number]   Height:       76.0 in Accession #:    [id number]  Weight:       230.0 lb Date of Birth:  [birth year]   BSA:          2.351 m? Patient Age:    51 years    BP:           179/107 mmHg Patient Gender: M           HR:           73 bpm. Exam Location:  Inpatient Procedure: 2D Echo, 3D Echo, Cardiac Doppler, Color Doppler and Intracardiac            Opacification Agent Indications:    R07.9* Chest pain, unspecified  History:        Patient has prior history of Echocardiogram examinations, most                 recent 08/26/2014. CAD, Abnormal ECG, Arrythmias:SVT,                 Signs/Symptoms:Fever; Risk Factors:Hypertension, Diabetes and                 Dyslipidemia.  Sonographer:    Roseanna Rainbow RDCS Referring Phys: [id number] [place]  Sonographer Comments: Technically difficult study due to poor echo windows and patient is morbidly obese. Image acquisition challenging due to patient body habitus. IMPRESSIONS  1. Left ventricular ejection fraction, by estimation, is 40 to 45%. The left ventricle has mildly decreased function. The left ventricle demonstrates global hypokinesis. There is moderate left ventricular hypertrophy. Left ventricular diastolic parameters are consistent with Grade I diastolic dysfunction (impaired relaxation).  2. Right ventricular systolic function is normal. The right ventricular size is normal. There is normal pulmonary artery systolic pressure.  3. The mitral valve is normal in structure. Trivial mitral valve regurgitation. No evidence  of mitral stenosis.  4. The aortic valve  is normal in structure. Aortic valve regurgitation is not visualized. No aortic stenosis is present.  5. The inferior vena cava is normal in size with greater than 50% respiratory variability, suggesting right atrial pressure of 3 mmHg. FINDINGS  Left Ventricle: Left ventricular ejection fraction, by estimation, is 40 to 45%. The left ventricle has mildly decreased function. The left ventricle demonstrates global hypokinesis. Definity contrast agent was given IV to delineate the left ventricular  endocardial borders. The left ventricular internal cavity size was normal in size. There is moderate left ventricular hypertrophy. Left ventricular diastolic parameters are consistent with Grade I diastolic dysfunction (impaired relaxation). Right Ventricle: The right ventricular size is normal. No increase in right ventricular wall thickness. Right ventricular systolic function is normal. There is normal pulmonary artery systolic pressure. The tricuspid regurgitant velocity is 2.42 m/s, and  with an assumed right atrial pressure of 3 mmHg, the estimated right ventricular systolic pressure is 28.3 mmHg. Left Atrium: Left atrial size was normal in size. Right Atrium: Right atrial size was normal in size. Pericardium: There is no evidence of pericardial effusion. Mitral Valve: The mitral valve is normal in structure. Trivial mitral valve regurgitation. No evidence of mitral valve stenosis. Tricuspid Valve: The tricuspid valve is normal in structure. Tricuspid valve regurgitation is not demonstrated. No evidence of tricuspid stenosis. Aortic Valve: The aortic valve is normal in structure. Aortic valve regurgitation is not visualized. No aortic stenosis is present. Pulmonic Valve: The pulmonic valve was normal in structure. Pulmonic valve regurgitation is not visualized. No evidence of pulmonic stenosis. Aorta: The aortic root is normal in size and structure. Venous: The inferior vena  cava is normal in size with greater than 50% respiratory variability, suggesting right atrial pressure of 3 mmHg. IAS/Shunts: No atrial level shunt detected by color flow Doppler.  LEFT VENTRICLE PLAX 2

## 2022-01-17 NOTE — Progress Notes (Signed)
?[facility] ?Progress Note  ? ?Subjective:   Overnight felt ok - ate dinner ok.  No dyspnea, no CP.  I/Os yesterday 1320 / 2050 all UOP.  HA this AM - chronic issue.  Nausea this AM with emesis.  ? ?Objective ?Vitals:  ? 01/16/22 2042 01/16/22 2142 01/17/22 0745 01/17/22 0747  ?BP:  (!) 160/85    ?Pulse:  63    ?Resp:  18    ?Temp:  98.4 ?F (36.9 ?C)    ?TempSrc:  Oral    ?SpO2: 94% 99% 99% 99%  ?Weight:      ?Height:      ? ?Physical Exam ?General: comfortable in bed, awake and alert ?Heart: RRR ?Lungs: clear to bases ?Abdomen: soft ?Extremities: no edema ?Neuro: nonfocal, no asterixis  ? ?Additional Objective ?Labs: ?Basic Metabolic Panel: ?Recent Labs  ?Lab 01/15/22 ?1157 01/16/22 ?0415 01/17/22 ?0410  ?NA 144 144 142  ?K 4.9 4.9 5.3*  ?CL 116* 114* 112*  ?CO2 22 21* 22  ?GLUCOSE 141* 162* 151*  ?BUN 43* 47* 52*  ?CREATININE 3.66* 4.07* 4.74*  ?CALCIUM 7.6* 7.4* 7.8*  ?PHOS  --  5.7*  --   ? ?Liver Function Tests: ?Recent Labs  ?Lab 01/16/22 ?0960  ?AST 10*  ?ALT 11  ?ALKPHOS 70  ?BILITOT 0.3  ?PROT 6.5  ?ALBUMIN 2.8*  ? ?No results for input(s): LIPASE, AMYLASE in the last 168 hours. ?CBC: ?Recent Labs  ?Lab 01/15/22 ?0001 01/16/22 ?0415 01/17/22 ?0410  ?WBC 9.8 9.3 8.6  ?NEUTROABS  --  6.6  --   ?HGB 9.7* 9.0* 9.3*  ?HCT 30.5* 29.0* 30.8*  ?MCV 95.9 97.0 98.1  ?PLT 184 183 179  ? ?Blood Culture ?   ?Component Value Date/Time  ? [place], CLEAN CATCH 10/10/2019 0056  ? SPECREQUEST  10/10/2019 0056  ?  NONE ?Performed at [facility], [place] [address], [place], [place] [address] ?  ? CULT MULTIPLE SPECIES PRESENT, SUGGEST RECOLLECTION (A) 10/10/2019 0056  ? REPTSTATUS 10/11/2019 FINAL 10/10/2019 0056  ? ? ?Cardiac Enzymes: ?No results for input(s): CKTOTAL, CKMB, CKMBINDEX, TROPONINI in the last 168 hours. ?CBG: ?Recent Labs  ?Lab 01/16/22 ?0732 01/16/22 ?1128 01/16/22 ?1603 01/16/22 ?2144 01/17/22 ?0732  ?GLUCAP 149* 187* 143* 130* 135*  ? ?Iron Studies:  ?Recent Labs  ?  01/15/22 ?8119   ?IRON 46  ?TIBC 214*  ?FERRITIN 155  ? ?@[email or web address] @ ?Studies/Results: ?NM Pulmonary Perfusion ? ?Result Date: 01/15/2022 ?CLINICAL DATA:  Chest pain EXAM: NUCLEAR MEDICINE PERFUSION LUNG SCAN TECHNIQUE: Perfusion images were obtained in multiple projections after intravenous injection of radiopharmaceutical. Ventilation scans intentionally deferred if perfusion scan and chest x-ray adequate for interpretation during COVID 19 epidemic. RADIOPHARMACEUTICALS:  4.3 mCi Tc-43m MAA IV COMPARISON:  Chest radiograph done on 01/14/2022 FINDINGS: No focal wedge-shaped perfusion defects are seen. IMPRESSION: Normal perfusion lung scan. There are no wedge-shaped perfusion defects. Electronically Signed   By: Elmer Picker M.D.   On: 01/15/2022 18:50  ? ?ECHOCARDIOGRAM COMPLETE ? ?Result Date: 01/15/2022 ?   ECHOCARDIOGRAM REPORT   Patient Name:   Howard Mcmahon Date of Exam: 01/15/2022 Medical Rec #:  [id number]   Height:       76.0 in Accession #:    [id number]  Weight:       230.0 lb Date of Birth:  13-Sep-1970   BSA:          2.351 m? Patient Age:    51 years    BP:  179/107 mmHg Patient Gender: M           HR:           73 bpm. Exam Location:  Inpatient Procedure: 2D Echo, 3D Echo, Cardiac Doppler, Color Doppler and Intracardiac            Opacification Agent Indications:    R07.9* Chest pain, unspecified  History:        Patient has prior history of Echocardiogram examinations, most                 recent 08/26/2014. CAD, Abnormal ECG, Arrythmias:SVT,                 Signs/Symptoms:Fever; Risk Factors:Hypertension, Diabetes and                 Dyslipidemia.  Sonographer:    Roseanna Rainbow RDCS Referring Phys: [id number] [place]  Sonographer Comments: Technically difficult study due to poor echo windows and patient is morbidly obese. Image acquisition challenging due to patient body habitus. IMPRESSIONS  1. Left ventricular ejection fraction, by estimation, is 40 to 45%. The left ventricle has mildly decreased function.  The left ventricle demonstrates global hypokinesis. There is moderate left ventricular hypertrophy. Left ventricular diastolic parameters are consistent with Grade I diastolic dysfunction (impaired relaxation).  2. Right ventricular systolic function is normal. The right ventricular size is normal. There is normal pulmonary artery systolic pressure.  3. The mitral valve is normal in structure. Trivial mitral valve regurgitation. No evidence of mitral stenosis.  4. The aortic valve is normal in structure. Aortic valve regurgitation is not visualized. No aortic stenosis is present.  5. The inferior vena cava is normal in size with greater than 50% respiratory variability, suggesting right atrial pressure of 3 mmHg. FINDINGS  Left Ventricle: Left ventricular ejection fraction, by estimation, is 40 to 45%. The left ventricle has mildly decreased function. The left ventricle demonstrates global hypokinesis. Definity contrast agent was given IV to delineate the left ventricular  endocardial borders. The left ventricular internal cavity size was normal in size. There is moderate left ventricular hypertrophy. Left ventricular diastolic parameters are consistent with Grade I diastolic dysfunction (impaired relaxation). Right Ventricle: The right ventricular size is normal. No increase in right ventricular wall thickness. Right ventricular systolic function is normal. There is normal pulmonary artery systolic pressure. The tricuspid regurgitant velocity is 2.42 m/s, and  with an assumed right atrial pressure of 3 mmHg, the estimated right ventricular systolic pressure is 42.5 mmHg. Left Atrium: Left atrial size was normal in size. Right Atrium: Right atrial size was normal in size. Pericardium: There is no evidence of pericardial effusion. Mitral Valve: The mitral valve is normal in structure. Trivial mitral valve regurgitation. No evidence of mitral valve stenosis. Tricuspid Valve: The tricuspid valve is normal in structure.  Tricuspid valve regurgitation is not demonstrated. No evidence of tricuspid stenosis. Aortic Valve: The aortic valve is normal in structure. Aortic valve regurgitation is not visualized. No aortic stenosis is present. Pulmonic Valve: The pulmonic valve was normal in structure. Pulmonic valve regurgitation is not visualized. No evidence of pulmonic stenosis. Aorta: The aortic root is normal in size and structure. Venous: The inferior vena cava is normal in size with greater than 50% respiratory variability, suggesting right atrial pressure of 3 mmHg. IAS/Shunts: No atrial level shunt detected by color flow Doppler.  LEFT VENTRICLE PLAX 2D LVIDd:         4.85 cm  Diastology LVIDs:         3.95 cm      LV e' medial:    6.56 cm/s LV PW:         1.40 cm      LV E/e' medial:  17.1 LV IVS:        1.30 cm      LV e' lateral:   5.81 cm/s LVOT diam:     2.40 cm      LV E/e' lateral: 19.3 LV SV:         82 LV SV Index:   35 LVOT Area:     4.52 cm?  LV Volumes (MOD) LV vol d, MOD A2C: 162.0 ml LV vol d, MOD A4C: 161.0 ml LV vol s, MOD A2C: 73.5 ml LV vol s, MOD A4C: 84.4 ml LV SV MOD A2C:     88.5 ml LV SV MOD A4C:     161.0 ml LV SV MOD BP:      83.5 ml RIGHT VENTRICLE            IVC RV S prime:     6.75 cm/s  IVC diam: 3.05 cm LEFT ATRIUM             Index        RIGHT ATRIUM           Index LA diam:        3.50 cm 1.49 cm/m?   RA Area:     21.70 cm? LA Vol (A2C):   47.6 ml 20.25 ml/m?  RA Volume:   65.50 ml  27.86 ml/m? LA Vol (A4C):   61.2 ml 26.03 ml/m? LA Biplane Vol: 57.8 ml 24.59 ml/m?  AORTIC VALVE LVOT Vmax:   86.30 cm/s LVOT Vmean:  55.300 cm/s LVOT VTI:    0.182 m  AORTA Ao Root diam: 3.00 cm Ao Asc diam:  3.60 cm MITRAL VALVE                TRICUSPID VALVE MV Area (PHT): 3.44 cm?     TR Peak grad:   23.4 mmHg MV Decel Time: 221 msec     TR Vmax:        242.00 cm/s MV E velocity: 112.00 cm/s MV A velocity: 102.37 cm/s  SHUNTS MV E/A ratio:  1.09         Systemic VTI:  0.18 m                             Systemic  Diam: 2.40 cm Candee Furbish MD Electronically signed by Candee Furbish MD Signature Date/Time: 01/15/2022/1:12:21 PM    Final   ? ?VAS [place] LOWER EXTREMITY VENOUS (DVT) ? ?Result Date: 01/15/2022 ? Lower Venous

## 2022-01-17 NOTE — Progress Notes (Signed)
Per MD order, urine collection container placed in patient bathroom for 24-hour urine collection. Patient given instructions about notifying NT/RN when he has urine to add to container. NT/RN will track intake/input into container. ?

## 2022-01-17 NOTE — Evaluation (Signed)
Physical Therapy Evaluation ?Patient Details ?Name: Howard Mcmahon ?MRN: [id number] ?DOB: 07/14/1971 ?Today's Date: 01/17/2022 ? ?History of Present Illness ? Patient is 50 y.o. male with PMH of CAD with remote PCI to OM1 and mRCA now s/p CABG (LIMA-LAD, SVG-dRCA, SVG-OM2 in 2019, HFpEF, CKD, OSA not on CPAP, obesity, HLD, HTN, DM wit foot wounds, PAD s/p multiple amputations, depression with hx of suicidal ideation, cardiology is following since 01/15/2022 for the evaluation of chest pain at the request of Dr. Tyrell Antonio. ? ?  ?Clinical Impression ? Patient evaluated by Physical Therapy with no further acute PT needs identified. All education has been completed and the patient has no further questions. Patient is mobilizing at supervision level to transfer from bed<>WC and is independent to manage wheelchair on unit. He is limited with community mobility due to wheelchair limitations and will benefit from follow up OP eval for lightweight wheelchair with removable armrests and wheels to reduce risk of injury and maximize independence with car transfers. See below for any follow-up Physical Therapy or equipment needs. PT is signing off. Thank you for this referral.    ?   ? ?Recommendations for follow up therapy are one component of a multi-disciplinary discharge planning process, led by the attending physician.  Recommendations may be updated based on patient status, additional functional criteria and insurance authorization. ? ?Follow Up Recommendations Outpatient PT (for prosthetic (follow up with surgeon)) ? ?  ?Assistance Recommended at Discharge PRN  ?Patient can return home with the following ?   ? ?  ?Equipment Recommendations Wheelchair cushion (measurements PT);Wheelchair (measurements PT) (needs to follow up with OP for wheelchair evaluation (pt will need light weight wheelchair with removable arm rests and removable wheels))  ?Recommendations for Other Services ?    ?  ?Functional Status Assessment Patient has not  had a recent decline in their functional status  ? ?  ?Precautions / Restrictions Precautions ?Precautions: Fall ?Precaution Comments: Lt BKA ?Restrictions ?Weight Bearing Restrictions: No  ? ?  ? ?Mobility ? Bed Mobility ?Overal bed mobility: Needs Assistance ?Bed Mobility: Supine to Sit ?  ?  ?  ?  ?  ?  ?  ? ?Transfers ?Overall transfer level: Needs assistance ?Equipment used: None ?Transfers: Sit to/from Stand, Bed to chair/wheelchair/BSC ?Sit to Stand: Supervision ?Stand pivot transfers: Supervision ?  ?  ?  ?  ?General transfer comment: no cues or assit needed. pt completed 180* for stand pivot transfer bed<>wheelchair. ?  ? ?Ambulation/Gait ?  ?  ?  ?  ?  ?  ?  ?  ? ?Stairs ?  ?  ?  ?  ?  ? ?Wheelchair Mobility ?Wheelchair Mobility ?Wheelchair mobility: Yes ?Wheelchair propulsion: Both upper extremities, Right lower extremity ?Wheelchair parts: Independent ?Distance: 400 ?Wheelchair Assistance Details (indicate cue type and reason): pt demonstrated good safety awareness for wheelchair managaement. no cues needed for safe navigation in hallway. pt took 3 rest breaks throughout. ? ?Modified Rankin (Stroke Patients Only) ?  ? ?  ? ?Balance Overall balance assessment: Needs assistance ?Sitting-balance support: Feet supported ?Sitting balance-Leahy Scale: Normal ?  ?  ?Standing balance support: Reliant on assistive device for balance ?Standing balance-Leahy Scale: Poor ?Standing balance comment: reliant on external support for stand pivot transfer ?  ?  ?  ?  ?  ?  ?  ?  ?  ?  ?  ?   ? ? ? ?Pertinent Vitals/Pain Pain Assessment ?Pain Assessment: Faces ?Faces Pain Scale: Hurts a little bit ?  Pain Location: headache ?Pain Descriptors / Indicators: Headache ?Pain Intervention(s): Monitored during session  ? ? ?Home Living Family/patient expects to be discharged to:: Private residence ?Living Arrangements: Parent ?Available Help at Discharge: Family ?Type of Home: House ?Home Access: Stairs to enter ?Entrance  Stairs-Rails: Left;Right ?Entrance Stairs-Number of Steps: 3 ?  ?Home Layout: One level ?  ?   ?  ?Prior Function Prior Level of Function : Needs assist;Independent/Modified Independent;Driving ?  ?  ?  ?  ?  ?  ?Mobility Comments: patient is independent with transfers and wheelchair transfers. he reports he asecnds/descend stairs with his risdual limb by half kneeling to step down and moves his wheelchair down/up himself. he does require assist to move [facility] into his car for car transfers due to the armrests and wheels not being removable. ?  ?  ? ? ?Hand Dominance  ?   ? ?  ?Extremity/Trunk Assessment  ? Upper Extremity Assessment ?Upper Extremity Assessment: Overall WFL for tasks assessed ?  ? ?Lower Extremity Assessment ?Lower Extremity Assessment: RLE deficits/detail;LLE deficits/detail ?RLE Deficits / Details: good functional strength to stand pivot transfer. ?RLE Sensation: WNL ?RLE Coordination: WNL ?LLE Deficits / Details: BKA, pt with functional use of residual limb ?LLE Sensation: WNL ?LLE Coordination: WNL ?  ? ?Cervical / Trunk Assessment ?Cervical / Trunk Assessment: Normal  ?Communication  ? Communication: No difficulties  ?Cognition Arousal/Alertness: Awake/alert ?Behavior During Therapy: [facility] for tasks assessed/performed ?Overall Cognitive Status: Within Functional Limits for tasks assessed ?  ?  ?  ?  ?  ?  ?  ?  ?  ?  ?  ?  ?  ?  ?  ?  ?  ?  ?  ? ?  ?General Comments   ? ?  ?Exercises    ? ?Assessment/Plan  ?  ?PT Assessment Patient does not need any further PT services  ?PT Problem List   ? ?   ?  ?PT Treatment Interventions     ? ?PT Goals (Current goals can be found in the Care Plan section)  ?Acute Rehab PT Goals ?Patient Stated Goal: get new chair and learn to use prosthesis to increase independence ?PT Goal Formulation: All assessment and education complete, DC therapy ?Time For Goal Achievement: 01/18/22 ?Potential to Achieve Goals: Good ? ?  ?Frequency   ?  ? ? ?Co-evaluation   ?  ?  ?  ?   ? ? ?  ?AM-PAC PT "6 Clicks" Mobility  ?Outcome Measure Help needed turning from your back to your side while in a flat bed without using bedrails?: None ?Help needed moving from lying on your back to sitting on the side of a flat bed without using bedrails?: None ?Help needed moving to and from a bed to a chair (including a wheelchair)?: A Little ?Help needed standing up from a chair using your arms (e.g., wheelchair or bedside chair)?: A Little ?Help needed to walk in hospital room?: A Little ?Help needed climbing 3-5 steps with a railing? : A Little ?6 Click Score: 20 ? ?  ?End of Session   ?Activity Tolerance: Patient tolerated treatment well ?Patient left: in bed;with call bell/phone within reach;with bed alarm set ?Nurse Communication: Mobility status ?PT Visit Diagnosis: Other abnormalities of gait and mobility (R26.89);Difficulty in walking, not elsewhere classified (R26.2) ?  ? ?Time: 5053-9767 ?PT Time Calculation (min) (ACUTE ONLY): 30 min ? ? ?Charges:   PT Evaluation ?$PT Eval Low Complexity: 1 Low ?PT Treatments ?$Wheel Chair Management:  8-22 mins ?  ?   ? ? ?Gwynneth Albright PT, DPT ?Acute Rehabilitation Services ?Office [phone] ?Pager [phone]  ? ?Jacques Navy ?01/17/2022, 4:08 PM ? ?

## 2022-01-18 ENCOUNTER — Inpatient Hospital Stay (HOSPITAL_COMMUNITY): Payer: 59

## 2022-01-18 DIAGNOSIS — R079 Chest pain, unspecified: Secondary | ICD-10-CM | POA: Diagnosis not present

## 2022-01-18 LAB — KAPPA/LAMBDA LIGHT CHAINS
Kappa free light chain: 277 mg/L — ABNORMAL HIGH (ref 3.3–19.4)
Kappa, lambda light chain ratio: 2.72 — ABNORMAL HIGH (ref 0.26–1.65)
Lambda free light chains: 102 mg/L — ABNORMAL HIGH (ref 5.7–26.3)

## 2022-01-18 LAB — RENAL FUNCTION PANEL
Albumin: 2.8 g/dL — ABNORMAL LOW (ref 3.5–5.0)
Anion gap: 6 (ref 5–15)
BUN: 61 mg/dL — ABNORMAL HIGH (ref 6–20)
CO2: 23 mmol/L (ref 22–32)
Calcium: 8.2 mg/dL — ABNORMAL LOW (ref 8.9–10.3)
Chloride: 112 mmol/L — ABNORMAL HIGH (ref 98–111)
Creatinine, Ser: 4.63 mg/dL — ABNORMAL HIGH (ref 0.61–1.24)
GFR, Estimated: 15 mL/min — ABNORMAL LOW (ref >60)
Glucose, Bld: 153 mg/dL — ABNORMAL HIGH (ref 70–99)
Phosphorus: 5.9 mg/dL — ABNORMAL HIGH (ref 2.5–4.6)
Potassium: 5.4 mmol/L — ABNORMAL HIGH (ref 3.5–5.1)
Sodium: 141 mmol/L (ref 135–145)

## 2022-01-18 LAB — MICROALBUMIN / CREATININE URINE RATIO
Creatinine, Urine: 97.7 mg/dL
Microalb Creat Ratio: 2140 mg/g creat — ABNORMAL HIGH (ref 0–29)
Microalb, Ur: 2091 ug/mL — ABNORMAL HIGH

## 2022-01-18 LAB — GLUCOSE, CAPILLARY
Glucose-Capillary: 167 mg/dL — ABNORMAL HIGH (ref 70–99)
Glucose-Capillary: 131 mg/dL — ABNORMAL HIGH (ref 70–99)
Glucose-Capillary: 147 mg/dL — ABNORMAL HIGH (ref 70–99)
Glucose-Capillary: 128 mg/dL — ABNORMAL HIGH (ref 70–99)

## 2022-01-18 MED ORDER — SALINE SPRAY 0.65 % NA SOLN
1.0000 | NASAL | Status: DC | PRN
  Administered 2022-01-18: 1 via NASAL
  Filled 2022-01-18: qty 44

## 2022-01-18 MED ORDER — SODIUM CHLORIDE 0.9 % IV SOLN: INTRAVENOUS | Status: DC

## 2022-01-18 MED ORDER — SODIUM ZIRCONIUM CYCLOSILICATE 10 G PO PACK
10.0000 g | PACK | Freq: Once | ORAL | Status: AC
  Administered 2022-01-18: 10 g via ORAL
  Filled 2022-01-18: qty 1

## 2022-01-18 MED ORDER — SODIUM CHLORIDE 0.9 % IV SOLN: INTRAVENOUS | Status: AC

## 2022-01-18 NOTE — Progress Notes (Addendum)
? ?Progress Note ? ?Patient Name: Howard Mcmahon ?Date of Encounter: 01/18/2022 ? ?[facility] Cardiologist: Werner Lean, MD  ? ?Subjective  ? ?Has chronic headache prior to starting Imdur.  No recurrent chest pain.  Denies dyspnea. ? ?Inpatient Medications  ?  ?Scheduled Meds: ? aspirin EC  81 mg Oral Daily  ? atorvastatin  80 mg Oral Daily  ? carvedilol  25 mg Oral BID WC  ? enoxaparin (LOVENOX) injection  30 mg Subcutaneous Q24H  ? hydrALAZINE  100 mg Oral TID  ? insulin aspart  0-15 Units Subcutaneous TID WC  ? insulin aspart  0-5 Units Subcutaneous QHS  ? isosorbide mononitrate  60 mg Oral Daily  ? pantoprazole  40 mg Oral BID  ? polyethylene glycol  17 g Oral Daily  ? senna-docusate  1 tablet Oral BID  ? technetium albumin aggregated  4.3 millicurie Intravenous Once  ? ?Continuous Infusions: ? ?PRN Meds: ?HYDROcodone-acetaminophen, ipratropium-albuterol, nitroGLYCERIN, ondansetron (ZOFRAN) IV  ? ?Vital Signs  ?  ?Vitals:  ? 01/17/22 0747 01/17/22 1115 01/17/22 2211 01/18/22 0648  ?BP:  (!) 163/87 132/89 (!) 147/77  ?Pulse:  73 74 73  ?Resp:  16 20 18   ?Temp:  98.2 ?F (36.8 ?C) 98.4 ?F (36.9 ?C) 97.9 ?F (36.6 ?C)  ?TempSrc:  Oral Oral Oral  ?SpO2: 99% 98% 98% 99%  ?Weight:      ?Height:      ? ? ?Intake/Output Summary (Last 24 hours) at 01/18/2022 1028 ?Last data filed at 01/17/2022 2220 ?Gross per 24 hour  ?Intake --  ?Output 650 ml  ?Net -650 ml  ? ? ?  01/15/2022  ? 12:02 AM 11/03/2021  ? 11:53 AM 05/21/2021  ?  5:39 PM  ?Last 3 Weights  ?Weight (lbs) 230 lb 271 lb 2.7 oz 271 lb  ?Weight (kg) 104.327 kg 123 kg 122.925 kg  ?   ? ?Telemetry  ?  ?Currently not on telemetry ? ?ECG  ?  ? ? ?Physical Exam  ? ?GEN: No acute distress.   ?Neck: No JVD ?Cardiac: RRR, no murmurs, rubs, or gallops.  ?Respiratory: Clear to auscultation bilaterally. ?GI: Soft, nontender, non-distended  ?MS:  s/p Left BKA, RLE no edema ?Neuro:  Nonfocal  ?Psych: Normal affect  ? ?Labs  ?  ?High Sensitivity Troponin:   ?Recent Labs   ?Lab 01/15/22 ?0001 01/15/22 ?0147  ?TROPONINIHS 10 9  ?   ?Chemistry ?Recent Labs  ?Lab 01/16/22 ?0415 01/17/22 ?0410 01/18/22 ?0418  ?NA 144 142 141  ?K 4.9 5.3* 5.4*  ?CL 114* 112* 112*  ?CO2 21* 22 23  ?GLUCOSE 162* 151* 153*  ?BUN 47* 52* 61*  ?CREATININE 4.07* 4.74* 4.63*  ?CALCIUM 7.4* 7.8* 8.2*  ?MG 1.6*  --   --   ?PROT 6.5  --   --   ?ALBUMIN 2.8*  --  2.8*  ?AST 10*  --   --   ?ALT 11  --   --   ?ALKPHOS 70  --   --   ?BILITOT 0.3  --   --   ?GFRNONAA 17* 14* 15*  ?ANIONGAP 9 8 6   ?  ?Lipids No results for input(s): CHOL, TRIG, HDL, LABVLDL, LDLCALC, CHOLHDL in the last 168 hours.  ?Hematology ?Recent Labs  ?Lab 01/15/22 ?0001 01/16/22 ?0415 01/17/22 ?0410  ?WBC 9.8 9.3 8.6  ?RBC 3.18* 2.99* 3.14*  ?HGB 9.7* 9.0* 9.3*  ?HCT 30.5* 29.0* 30.8*  ?MCV 95.9 97.0 98.1  ?MCH 30.5 30.1 29.6  ?MCHC  31.8 31.0 30.2  ?RDW 13.1 12.9 12.8  ?PLT 184 183 179  ? ?Thyroid No results for input(s): TSH, FREET4 in the last 168 hours.  ?BNP ?Recent Labs  ?Lab 01/15/22 ?0147  ?BNP 225.1*  ?  ?DDimer  ?Recent Labs  ?Lab 01/15/22 ?4650  ?DDIMER 1.33*  ?  ? ?Radiology  ?  ?DG Abd 1 View ? ?Result Date: 01/18/2022 ?CLINICAL DATA:  Nausea, vomiting. EXAM: ABDOMEN - 1 VIEW COMPARISON:  September 22, 2021. FINDINGS: The bowel gas pattern is normal. No radio-opaque calculi or other significant radiographic abnormality are seen. IMPRESSION: Negative. Electronically Signed   By: Marijo Conception M.D.   On: 01/18/2022 10:20   ? ?Cardiac Studies  ? ?Echo from 01/15/22: ? 1. Left ventricular ejection fraction, by estimation, is 40 to 45%. The  ?left ventricle has mildly decreased function. The left ventricle  ?demonstrates global hypokinesis. There is moderate left ventricular  ?hypertrophy. Left ventricular diastolic  ?parameters are consistent with Grade I diastolic dysfunction (impaired  ?relaxation).  ? 2. Right ventricular systolic function is normal. The right ventricular  ?size is normal. There is normal pulmonary artery systolic  pressure.  ? 3. The mitral valve is normal in structure. Trivial mitral valve  ?regurgitation. No evidence of mitral stenosis.  ? 4. The aortic valve is normal in structure. Aortic valve regurgitation is  ?not visualized. No aortic stenosis is present.  ? 5. The inferior vena cava is normal in size with greater than 50%  ?respiratory variability, suggesting right atrial pressure of 3 mmHg.  ? ?Patient Profile  ?   ?Howard Mcmahon is a 51 y.o. male with a hx of CAD with remote PCI to OM1 and mRCA now s/p CABG (LIMA-LAD, SVG-dRCA, SVG-OM2 in 2019, HFpEF, CKD, OSA not on CPAP, obesity, HLD, HTN, DM wit foot wounds, PAD s/p multiple amputations, depression with hx of suicidal ideation, cardiology is following since 01/15/2022 for the evaluation of chest pain at the request of Dr. Tyrell Antonio. ? ?Assessment & Plan  ?  ?Chest pain with history of CAD s/p CABG ?Troponin negative.  EKG without acute ischemic changes.  VQ scan negative for pulmonary embolism. ?- PET Stress November 2022 with negative ischemia ?-No plan for cath given CKD and resolution of symptoms on Imdur ?-Continue aspirin 81 mg daily, carvedilol 25 mg twice daily, Lipitor 80 mg daily, Imdur 60 mg daily and hydralazine 100 mg 3 times daily ? ?2.  Ischemic cardiomyopathy ?2.  Acute combined CHF ?-Echo 01/15/22 with LVEF 45-50%, mildly reduced LV function, mod LVH, grade I DD, nomral RV and PSAP, trivial MR ?-Chest x-ray with mild pulmonary edema ?-Appears euvolemic. ?-Continue Coreg, Imdur and hydralazine ?-Not a candidate for ACE/ARB/ARNI/SGLT 2i due to CKD ?-Any diuretics will be managed by nephrology ? ?Will sign off.  Call with questions.  Medication recommendation as above.  We will arrange follow-up. Also followed with [facility] Dr. Grier Rocher.  ? ?For questions or updates, please contact [place] ?Please consult [email or web address] for contact info under  ? ?  ?   ?Signed, ?Leanor Kail, PA  ?01/18/2022, 10:28 AM    ? ?Personally seen and examined. Agree  with APP above with the following comments: ?Patient notes no symptoms.  CP, SOB, and palpitations gone.  Had am n/v but is sleeping comfortably now.  ?Exam is unchanged from 01/17/22, has some R hand digits missing but agree with above ?Creatinine is still 4+ ? ?He is on coreg, Imdur, and hydralazine with better BP  control and no cardiac sx.  No plans fot kidney based GDMT until his kidney issues resolve. He would like to transition his care to [facility] and we have follow up arranged.  Will SO; please call with new issues/concerns. ? ?Rudean Haskell, MD ?Cardiologist ?Structural Imaging/Hypertrophic Cardiomyopathy ?[facility]  ?[address], [address] ?[place], [place] [address] ?([phone]  ?12:35 PM ? ? ?

## 2022-01-18 NOTE — Progress Notes (Signed)
?PROGRESS NOTE ? ? ? ?Howard Mcmahon  OXB:[id number] DOB: 1971/06/17 DOA: 01/14/2022 ?PCP: [place], Bonnie  ? ?Brief Narrative: ?51 year old with past medical history significant for diabetes type 2, peripheral vascular disease a status post left BKA, OSA, chronic anxiety depression,  hyperlipidemia who presents complaining of chest pain, associated with shortness of breath troponin x2 negative.  He reports having pain on and off for the last week..  VQ scan was negative for PE.  Cardiology was consulted due to concern for edema.  She was a started on amiodarone. ? ?On admission he was noted to have worsening of his renal function, creatinine today increased to 4.  Nephrology was consulted. ? ? ?Assessment & Plan: ?  ?Principal Problem: ?  Chest pain ? ?1-Chest pain; CAD, History of CABG:  ?Concern for angina. In the setting of HTN urgency.  ?Prior History CAD>  ?Troponin negative.  ?Cardiology consulted.  ?ECHO order. Global Hypokinesis, EF 45 % ?VQ scan negative for PE.  ?He was started on Imdur by cardiology.  ?Chest pain free.  ?He was also started on aspirin.  ?Plan for medical treatment.  ? ?2-HFpEF 55 to 65 % ?Continue with  carvedilol on hydralazine ?  ?3-Mild pulmonary edema vs Bronchitis on  x ray.  ?Diuresis on hold due to AKI.  ? Denies dyspnea.  ? ?4-AKI on CKD stage IIIb/hyperkalemia  ?Creatinine baseline 2.4 ?Presented with a creatinine of 3.7 ?Renal ultrasound: normal renal US.  ?Monitor  urine output ?Nephrology consulted, will follow recommendation   ?  ?  ?Mild anion gap metabolic acidosis in the setting of AKI: Continue with bicarb supplements ?  ?Anemia of chronic kidney disease: Monitor hemoglobin ?  ?LE edema; Doppler negative for DVT ?  ? h/o left BKA, currently wheelchair bound, awaiting to get prosthesis , f/u with [facility] ?Bilateral hand deformity, reports chronic , follow with [facility] ? ?Estimated body mass index is 28 kg/m? as calculated from the following: ?  Height  as of this encounter: 6\' 4"  (1.93 m). ?  Weight as of this encounter: 104.3 kg. ? ? ?DVT prophylaxis: Lovenox ?Code Status: Full code ?Family Communication: Care discussed with patient.  ?Disposition Plan:  ?Status is: Inpatient ?Remains inpatient appropriate because: management of AKI ? ? ? ?Consultants:  ?Cardiology ?Nephrology  ? ?Procedures:  ?ECHO ?Renal US ? ?Antimicrobials:  ? ? ?Subjective: ? ?Reports Headache is chronic for at least a few month if not longer, reports have a sleep study scheduled  ? no chest pain, no sob ?Right leg edema for a few months, today no significant leg edema ?Cr/k remain elevated ? ? ?Objective: ?Vitals:  ? 01/17/22 1115 01/17/22 2211 01/18/22 0648 01/18/22 1348  ?BP: (!) 163/87 132/89 (!) 147/77 133/81  ?Pulse: 73 74 73 70  ?Resp: 16 20 18 18   ?Temp: 98.2 ?F (36.8 ?C) 98.4 ?F (36.9 ?C) 97.9 ?F (36.6 ?C) 98.3 ?F (36.8 ?C)  ?TempSrc: Oral Oral Oral Oral  ?SpO2: 98% 98% 99% 95%  ?Weight:      ?Height:      ? ? ?Intake/Output Summary (Last 24 hours) at 01/18/2022 1853 ?Last data filed at 01/18/2022 1500 ?Gross per 24 hour  ?Intake 391.1 ml  ?Output 350 ml  ?Net 41.1 ml  ? ?Filed Weights  ? 01/15/22 0002  ?Weight: 104.3 kg  ? ? ?Examination: ? ?General exam: Appears calm and comfortable  ?Respiratory system: Clear to auscultation. Respiratory effort normal. ?Cardiovascular system: S1 & S2 heard, RRR. No JVD,  murmurs, rubs, gallops or clicks. No pedal edema. ?Gastrointestinal system: Abdomen is nondistended, soft and nontender. No organomegaly or masses felt. Normal bowel sounds heard. ?Central nervous system: Alert and oriented. No focal neurological deficits. ?Extremities: Left BKA, right lower extremity edema has much improved  ? ? ?Data Reviewed: I have personally reviewed following labs and imaging studies ? ?CBC: ?Recent Labs  ?Lab 01/15/22 ?0001 01/16/22 ?0415 01/17/22 ?0410  ?WBC 9.8 9.3 8.6  ?NEUTROABS  --  6.6  --   ?HGB 9.7* 9.0* 9.3*  ?HCT 30.5* 29.0* 30.8*  ?MCV 95.9 97.0  98.1  ?PLT 184 183 179  ? ?Basic Metabolic Panel: ?Recent Labs  ?Lab 01/15/22 ?0001 01/15/22 ?1157 01/16/22 ?0415 01/17/22 ?0410 01/18/22 ?0418  ?NA 142 144 144 142 141  ?K 4.7 4.9 4.9 5.3* 5.4*  ?CL 114* 116* 114* 112* 112*  ?CO2 20* 22 21* 22 23  ?GLUCOSE 250* 141* 162* 151* 153*  ?BUN 41* 43* 47* 52* 61*  ?CREATININE 3.77* 3.66* 4.07* 4.74* 4.63*  ?CALCIUM 7.3* 7.6* 7.4* 7.8* 8.2*  ?MG  --   --  1.6*  --   --   ?PHOS  --   --  5.7*  --  5.9*  ? ?GFR: ?Estimated Creatinine Clearance: 25.6 mL/min (A) (by C-G formula based on SCr of 4.63 mg/dL (H)). ?Liver Function Tests: ?Recent Labs  ?Lab 01/16/22 ?0415 01/18/22 ?0418  ?AST 10*  --   ?ALT 11  --   ?ALKPHOS 70  --   ?BILITOT 0.3  --   ?PROT 6.5  --   ?ALBUMIN 2.8* 2.8*  ? ?No results for input(s): LIPASE, AMYLASE in the last 168 hours. ?No results for input(s): AMMONIA in the last 168 hours. ?Coagulation Profile: ?No results for input(s): INR, PROTIME in the last 168 hours. ?Cardiac Enzymes: ?No results for input(s): CKTOTAL, CKMB, CKMBINDEX, TROPONINI in the last 168 hours. ?BNP (last 3 results) ?No results for input(s): PROBNP in the last 8760 hours. ?HbA1C: ?No results for input(s): HGBA1C in the last 72 hours. ? ?CBG: ?Recent Labs  ?Lab 01/17/22 ?9528 01/17/22 ?2209 01/18/22 ?4132 01/18/22 ?1213 01/18/22 ?1657  ?GLUCAP 131* 134* 167* 131* 147*  ? ?Lipid Profile: ?No results for input(s): CHOL, HDL, LDLCALC, TRIG, CHOLHDL, LDLDIRECT in the last 72 hours. ?Thyroid Function Tests: ?No results for input(s): TSH, T4TOTAL, FREET4, T3FREE, THYROIDAB in the last 72 hours. ?Anemia Panel: ?No results for input(s): VITAMINB12, FOLATE, FERRITIN, TIBC, IRON, RETICCTPCT in the last 72 hours. ? ?Sepsis Labs: ?No results for input(s): PROCALCITON, LATICACIDVEN in the last 168 hours. ? ?No results found for this or any previous visit (from the past 240 hour(s)).  ? ? ? ? ? ?Radiology Studies: ?DG Abd 1 View ? ?Result Date: 01/18/2022 ?CLINICAL DATA:  Nausea, vomiting. EXAM:  ABDOMEN - 1 VIEW COMPARISON:  September 22, 2021. FINDINGS: The bowel gas pattern is normal. No radio-opaque calculi or other significant radiographic abnormality are seen. IMPRESSION: Negative. Electronically Signed   By: Marijo Conception M.D.   On: 01/18/2022 10:20   ? ? ? ? ? ?Scheduled Meds: ? aspirin EC  81 mg Oral Daily  ? atorvastatin  80 mg Oral Daily  ? carvedilol  25 mg Oral BID WC  ? enoxaparin (LOVENOX) injection  30 mg Subcutaneous Q24H  ? hydrALAZINE  100 mg Oral TID  ? insulin aspart  0-15 Units Subcutaneous TID WC  ? insulin aspart  0-5 Units Subcutaneous QHS  ? isosorbide mononitrate  60 mg Oral Daily  ?  pantoprazole  40 mg Oral BID  ? polyethylene glycol  17 g Oral Daily  ? senna-docusate  1 tablet Oral BID  ? technetium albumin aggregated  4.3 millicurie Intravenous Once  ? ?Continuous Infusions: ? sodium chloride 100 mL/hr at 01/18/22 1217  ? ? ? LOS: 2 days  ? ? ? ? ? ? ?Florencia Reasons, MD PhD FACP ?[facility] ? ? ?If 7PM-7AM, please contact night-coverage ?[email or web address] ? ?01/18/2022, 6:53 PM   ?

## 2022-01-18 NOTE — Progress Notes (Signed)
Patient complained of nausea and vomiting since early AM. Pt. Stated he had 5x episodes of vomiting. MD was notified. Stat US Abdomen ordered. Will continue to monitor. ?

## 2022-01-18 NOTE — Progress Notes (Signed)
?[facility] ?Progress Note  ? ?Subjective:   Continued N/V - he thinks from "HA medication" - looks like has rec'd norco several times.  Abd x ray normal.  Abd [place] pending.  I/Os yest 360 / 1350.  No weights.  BPs improved.  Labs BUN/Cr 61 / 4.6 from 52 / 4.74.  No voiding difficulty ? ?Objective ?Vitals:  ? 01/17/22 0747 01/17/22 1115 01/17/22 2211 01/18/22 0648  ?BP:  (!) 163/87 132/89 (!) 147/77  ?Pulse:  73 74 73  ?Resp:  16 20 18   ?Temp:  98.2 ?F (36.8 ?C) 98.4 ?F (36.9 ?C) 97.9 ?F (36.6 ?C)  ?TempSrc:  Oral Oral Oral  ?SpO2: 99% 98% 98% 99%  ?Weight:      ?Height:      ? ?Physical Exam ?General:  looks nontoxic but having some wretching ?Heart: RRR ?Lungs: clear to bases ?Abdomen: soft ?Extremities: no edema ?Neuro: nonfocal, no asterixis  ? ?Additional Objective ?Labs: ?Basic Metabolic Panel: ?Recent Labs  ?Lab 01/16/22 ?0415 01/17/22 ?0410 01/18/22 ?0418  ?NA 144 142 141  ?K 4.9 5.3* 5.4*  ?CL 114* 112* 112*  ?CO2 21* 22 23  ?GLUCOSE 162* 151* 153*  ?BUN 47* 52* 61*  ?CREATININE 4.07* 4.74* 4.63*  ?CALCIUM 7.4* 7.8* 8.2*  ?PHOS 5.7*  --  5.9*  ? ? ?Liver Function Tests: ?Recent Labs  ?Lab 01/16/22 ?0415 01/18/22 ?0418  ?AST 10*  --   ?ALT 11  --   ?ALKPHOS 70  --   ?BILITOT 0.3  --   ?PROT 6.5  --   ?ALBUMIN 2.8* 2.8*  ? ? ?No results for input(s): LIPASE, AMYLASE in the last 168 hours. ?CBC: ?Recent Labs  ?Lab 01/15/22 ?0001 01/16/22 ?0415 01/17/22 ?0410  ?WBC 9.8 9.3 8.6  ?NEUTROABS  --  6.6  --   ?HGB 9.7* 9.0* 9.3*  ?HCT 30.5* 29.0* 30.8*  ?MCV 95.9 97.0 98.1  ?PLT 184 183 179  ? ? ?Blood Culture ?   ?Component Value Date/Time  ? [place], CLEAN CATCH 10/10/2019 0056  ? SPECREQUEST  10/10/2019 0056  ?  NONE ?Performed at [facility], [place] [address], [place], [place] [address] ?  ? CULT MULTIPLE SPECIES PRESENT, SUGGEST RECOLLECTION (A) 10/10/2019 0056  ? REPTSTATUS 10/11/2019 FINAL 10/10/2019 0056  ? ? ?Cardiac Enzymes: ?No results for input(s): CKTOTAL, CKMB, CKMBINDEX,  TROPONINI in the last 168 hours. ?CBG: ?Recent Labs  ?Lab 01/17/22 ?0732 01/17/22 ?1112 01/17/22 ?1605 01/17/22 ?2209 01/18/22 ?4580  ?GLUCAP 135* 132* 131* 134* 167*  ? ? ?Iron Studies:  ?Recent Labs  ?  01/15/22 ?9983  ?IRON 46  ?TIBC 214*  ?FERRITIN 155  ? ? ?@[email or web address] @ ?Studies/Results: ?DG Abd 1 View ? ?Result Date: 01/18/2022 ?CLINICAL DATA:  Nausea, vomiting. EXAM: ABDOMEN - 1 VIEW COMPARISON:  September 22, 2021. FINDINGS: The bowel gas pattern is normal. No radio-opaque calculi or other significant radiographic abnormality are seen. IMPRESSION: Negative. Electronically Signed   By: Marijo Conception M.D.   On: 01/18/2022 10:20   ?Medications: ? ? aspirin EC  81 mg Oral Daily  ? atorvastatin  80 mg Oral Daily  ? carvedilol  25 mg Oral BID WC  ? enoxaparin (LOVENOX) injection  30 mg Subcutaneous Q24H  ? hydrALAZINE  100 mg Oral TID  ? insulin aspart  0-15 Units Subcutaneous TID WC  ? insulin aspart  0-5 Units Subcutaneous QHS  ? isosorbide mononitrate  60 mg Oral Daily  ? pantoprazole  40 mg Oral  BID  ? polyethylene glycol  17 g Oral Daily  ? senna-docusate  1 tablet Oral BID  ? technetium albumin aggregated  4.3 millicurie Intravenous Once  ? ? ?Assessment/Plan ?  ?**CP:  resolved, cardiology following.  VQ neg, trop cycled neg -- thought to be microvascular in etiology. No plans for LHC. ? ?**N/V: thinks med related, exam ok.  Imaging pending per primary. Has zofran.  ?  ?**AKI on CKD:  CKD 4 at BL with Cr 2.5mg /dL, GFR 20-30 in past year.  Now with AKI Cr 3.5 > 4 > 4.7 > 4.6. Renal US ok.  UA with blood and protein historically, similar this admission. Check MACR was 91 in 2020; pending.  With anemia will r/o myeloma - labs pending.  No systemic s/s vasculitis to suggest GN and I suspect this is underlying DM + arterionephrosclerosis in light of comorbids.   Unclear etiology for AKI.  No hypovolemia, hypotension, nephrotoxins by history.  Cont supportive care - no indications for RRT currently.  Given N/V  will give isotonic fluids today 100/hr x 10h.  Avoid nephrotoxins, maintain euvolemia, avoid hypotension.   ? ?**Hyperkalemia:  mild 5.4 today.  Lokelma 10g.  Trend.  ?  ?**HFpEF: Also with grade 1 DD.  BB, hydral, isosorbide.  Has been euvolemic, in light of N/V will give gentle fluids today. Cont 2g sodium fluid restricted diet.  ?  ?**HTN:  cont imdur and hydral, coreg.  ?  ?**DM:  A1c 6 but h/o poor control.  Meds per primary. Metformin on hold.  ?  ?**Metabolic acidosis: in setting of AKI/CKD. On po bicarb. Serum bicarb 23. ?  ?**Anemia, normocytic: in 9-10s for past year.  Iron sat 22%.  Monitor.  No current indication for ESA.  R/o myeloma.  ?  ?Will follow - call with concerns.  ? ?Jannifer Hick MD ?01/18/2022, 10:55 AM  ?[facility] ?Pager: [phone] ? ? ?

## 2022-01-19 ENCOUNTER — Inpatient Hospital Stay (HOSPITAL_COMMUNITY): Payer: 59

## 2022-01-19 DIAGNOSIS — R079 Chest pain, unspecified: Secondary | ICD-10-CM | POA: Diagnosis not present

## 2022-01-19 LAB — PROTEIN ELECTROPHORESIS, SERUM
A/G Ratio: 0.9 (ref 0.7–1.7)
Albumin ELP: 2.9 g/dL (ref 2.9–4.4)
Alpha-1-Globulin: 0.2 g/dL (ref 0.0–0.4)
Alpha-2-Globulin: 1 g/dL (ref 0.4–1.0)
Beta Globulin: 0.8 g/dL (ref 0.7–1.3)
Gamma Globulin: 1.4 g/dL (ref 0.4–1.8)
Globulin, Total: 3.4 g/dL (ref 2.2–3.9)
Total Protein ELP: 6.3 g/dL (ref 6.0–8.5)

## 2022-01-19 LAB — RENAL FUNCTION PANEL
Albumin: 2.8 g/dL — ABNORMAL LOW (ref 3.5–5.0)
Anion gap: 6 (ref 5–15)
BUN: 56 mg/dL — ABNORMAL HIGH (ref 6–20)
CO2: 22 mmol/L (ref 22–32)
Calcium: 8.2 mg/dL — ABNORMAL LOW (ref 8.9–10.3)
Chloride: 112 mmol/L — ABNORMAL HIGH (ref 98–111)
Creatinine, Ser: 4.63 mg/dL — ABNORMAL HIGH (ref 0.61–1.24)
GFR, Estimated: 15 mL/min — ABNORMAL LOW (ref >60)
Glucose, Bld: 166 mg/dL — ABNORMAL HIGH (ref 70–99)
Phosphorus: 5.5 mg/dL — ABNORMAL HIGH (ref 2.5–4.6)
Potassium: 5.1 mmol/L (ref 3.5–5.1)
Sodium: 140 mmol/L (ref 135–145)

## 2022-01-19 LAB — GLUCOSE, CAPILLARY
Glucose-Capillary: 123 mg/dL — ABNORMAL HIGH (ref 70–99)
Glucose-Capillary: 181 mg/dL — ABNORMAL HIGH (ref 70–99)
Glucose-Capillary: 137 mg/dL — ABNORMAL HIGH (ref 70–99)
Glucose-Capillary: 163 mg/dL — ABNORMAL HIGH (ref 70–99)

## 2022-01-19 NOTE — Progress Notes (Signed)
?[facility] ?Progress Note  ? ?Subjective:   N/V resolved now.  Abd x ray normal.  I/Os yest 630 / 1450.  No weights.    Labs BUN/Cr 56/4.6 from  61 / 4.6 from 52 / 4.74.  No voiding difficulty.   ? ?Objective ?Vitals:  ? 01/18/22 0648 01/18/22 1348 01/18/22 2204 01/19/22 0609  ?BP: (!) 147/77 133/81 (!) 153/92 (!) 162/82  ?Pulse: 73 70 74 68  ?Resp: 18 18 20 20   ?Temp: 97.9 ?F (36.6 ?C) 98.3 ?F (36.8 ?C) 98.2 ?F (36.8 ?C) 98 ?F (36.7 ?C)  ?TempSrc: Oral Oral Oral Oral  ?SpO2: 99% 95% 100% 100%  ?Weight:      ?Height:      ? ?Physical Exam ?General:  looks nontoxic  ?Heart: RRR ?Lungs: clear to bases ?Abdomen: soft ?Extremities: no edema ?Neuro: nonfocal, no asterixis  ? ?Additional Objective ?Labs: ?Basic Metabolic Panel: ?Recent Labs  ?Lab 01/16/22 ?0415 01/17/22 ?0410 01/18/22 ?7124 01/19/22 ?5809  ?NA 144 142 141 140  ?K 4.9 5.3* 5.4* 5.1  ?CL 114* 112* 112* 112*  ?CO2 21* 22 23 22   ?GLUCOSE 162* 151* 153* 166*  ?BUN 47* 52* 61* 56*  ?CREATININE 4.07* 4.74* 4.63* 4.63*  ?CALCIUM 7.4* 7.8* 8.2* 8.2*  ?PHOS 5.7*  --  5.9* 5.5*  ? ? ?Liver Function Tests: ?Recent Labs  ?Lab 01/16/22 ?0415 01/18/22 ?0418 01/19/22 ?9833  ?AST 10*  --   --   ?ALT 11  --   --   ?ALKPHOS 70  --   --   ?BILITOT 0.3  --   --   ?PROT 6.5  --   --   ?ALBUMIN 2.8* 2.8* 2.8*  ? ? ?No results for input(s): LIPASE, AMYLASE in the last 168 hours. ?CBC: ?Recent Labs  ?Lab 01/15/22 ?0001 01/16/22 ?0415 01/17/22 ?0410  ?WBC 9.8 9.3 8.6  ?NEUTROABS  --  6.6  --   ?HGB 9.7* 9.0* 9.3*  ?HCT 30.5* 29.0* 30.8*  ?MCV 95.9 97.0 98.1  ?PLT 184 183 179  ? ? ?Blood Culture ?   ?Component Value Date/Time  ? [place], CLEAN CATCH 10/10/2019 0056  ? SPECREQUEST  10/10/2019 0056  ?  NONE ?Performed at [facility], [place] [address] [place], [place] [address] ?  ? CULT MULTIPLE SPECIES PRESENT, SUGGEST RECOLLECTION (A) 10/10/2019 0056  ? REPTSTATUS 10/11/2019 FINAL 10/10/2019 0056  ? ? ?Cardiac Enzymes: ?No results for input(s): CKTOTAL,  CKMB, CKMBINDEX, TROPONINI in the last 168 hours. ?CBG: ?Recent Labs  ?Lab 01/18/22 ?0733 01/18/22 ?1213 01/18/22 ?1657 01/18/22 ?2202 01/19/22 ?3976  ?GLUCAP 167* 131* 147* 128* 123*  ? ? ?Iron Studies:  ?No results for input(s): IRON, TIBC, TRANSFERRIN, FERRITIN in the last 72 hours. ? ?@lablastinr3 @ ?Studies/Results: ?DG Abd 1 View ? ?Result Date: 01/18/2022 ?CLINICAL DATA:  Nausea, vomiting. EXAM: ABDOMEN - 1 VIEW COMPARISON:  September 22, 2021. FINDINGS: The bowel gas pattern is normal. No radio-opaque calculi or other significant radiographic abnormality are seen. IMPRESSION: Negative. Electronically Signed   By: Marijo Conception M.D.   On: 01/18/2022 10:20   ?Medications: ? ? aspirin EC  81 mg Oral Daily  ? atorvastatin  80 mg Oral Daily  ? carvedilol  25 mg Oral BID WC  ? enoxaparin (LOVENOX) injection  30 mg Subcutaneous Q24H  ? hydrALAZINE  100 mg Oral TID  ? insulin aspart  0-15 Units Subcutaneous TID WC  ? insulin aspart  0-5 Units Subcutaneous QHS  ? isosorbide mononitrate  60 mg Oral Daily  ? pantoprazole  40 mg Oral BID  ? polyethylene glycol  17 g Oral Daily  ? senna-docusate  1 tablet Oral BID  ? technetium albumin aggregated  4.3 millicurie Intravenous Once  ? ? ?Assessment/Plan ?  ?**CP:  resolved, cardiology following.  VQ neg, trop cycled neg -- thought to be microvascular in etiology. No plans for LHC. ? ?**N/V: thinks med related, exam ok.  Abd X ray fine.  Resolved.  ?  ?**AKI on CKD:  CKD 4 at BL with Cr 2.5mg /dL, GFR 20-30 in past year.  Now with AKI Cr 3.5 > 4 > 4.7 > 4.6 > 4.6; good UOP. Renal US ok.  UA with blood and protein historically, similar this admission. MACR was 91 in 2020; MACR 2140 now.  With anemia will r/o myeloma - labs pending.  No systemic s/s vasculitis to suggest GN and I suspect this is underlying DM + arterionephrosclerosis in light of comorbids.   Unclear etiology for AKI, ? Progression of CKD.  No hypovolemia, hypotension, nephrotoxins by history.  Cont supportive  care - no indications for RRT currently.  Discussed advanced CKD with him likely secondary to longstanding poorly controlled DM + arterionephrosclerosis.   Frankly discussed likely will need dialysis in the next year or 2 at best.  Vascular dz may preclude transplant, discussed.  He has a pending appt with [facility] nephrology early 02/2022 and I think that's fine to f/u there.  He prefers medical care there typically.  Avoid nephrotoxins, maintain euvolemia, avoid hypotension.   ? ?**Hyperkalemia:  mild 5.1 today.  Low K diet, avoid RAAS inhibition.    ?  ?**HFpEF: Also with grade 1 DD.  BB, hydral, isosorbide.  Euvolemic today.  Cont 2g sodium fluid restricted diet.  ?  ?**HTN:  cont imdur and hydral, coreg.  Variable BPs, avoid overtreatment/hypotension.  ?  ?**DM:  A1c 6 but h/o poor control.  Meds per primary. Metformin on hold, do not resume. ?  ?**Metabolic acidosis: in setting of AKI/CKD. On po bicarb. Serum bicarb 23. ?  ?**Anemia, normocytic: in 9-10s for past year.  Iron sat 22%.  Monitor.  No current indication for ESA.  R/o myeloma, per above.  ?  ?Nothing to add, has appt in a few weeks with [facility] nephrology he reports - reiterated to him importance of f/u with nephrology.   If he changes mind I'd be happy to follow up with him locally for CKD care.  ? ?Jannifer Hick MD ?01/19/2022, 10:59 AM  ?[facility] ?Pager: [phone] ? ? ?

## 2022-01-19 NOTE — Progress Notes (Signed)
?PROGRESS NOTE ? ? ? ?Howard Mcmahon  VCB:[id number] DOB: 1971-05-08 DOA: 01/14/2022 ?PCP: [place], [place]  ? ?Brief Narrative: ?51 year old with past medical history significant for diabetes type 2, peripheral vascular disease a status post left BKA, OSA, chronic anxiety depression,  hyperlipidemia who presents complaining of chest pain, associated with shortness of breath troponin x2 negative.  He reports having pain on and off for the last week..  VQ scan was negative for PE.  Cardiology was consulted due to concern for edema.  She was a started on amiodarone. ? ?On admission he was noted to have worsening of his renal function, creatinine today increased to 4.  Nephrology was consulted. ? ? ?Assessment & Plan: ?  ?Principal Problem: ?  Chest pain ? ?Chest pain; CAD, History of CABG:  ?Concern for angina. In the setting of HTN urgency.  ?Prior History CAD>  ?Troponin negative.  ?Cardiology consulted.  ?ECHO order. Global Hypokinesis, EF 45 % ?VQ scan negative for PE.  ?He was started on Imdur by cardiology.  ?Chest pain free.  ?He was also started on aspirin.  ?Plan for medical treatment.  ? ?HFpEF 55 to 65 % ?Continue with  carvedilol on hydralazine ?  ?-Mild pulmonary edema vs Bronchitis on  x ray.  ?Diuresis on hold due to AKI.  ? Denies dyspnea.  ? ?AKI on CKD stage IIIb/hyperkalemia  ?Creatinine baseline 2.4 ?Presented with a creatinine of 3.7 ?Renal ultrasound: normal renal US.  ?Monitor  urine output ?Nephrology consulted, will follow recommendation   ?  ?  ?Mild anion gap metabolic acidosis in the setting of AKI: Continue with bicarb supplements ?  ?Anemia of chronic kidney disease: Monitor hemoglobin ?  ?LE edema; Doppler negative for DVT ? ?Headache, reports ongoing for several months, reports in the process of  getting outpatient sleep study arranged ?Will get CT head ?  ? h/o left BKA, currently wheelchair bound, awaiting to get prosthesis , f/u with [facility] ?Bilateral hand deformity,  reports chronic , follow with [facility] ? ?Estimated body mass index is 28 kg/m? as calculated from the following: ?  Height as of this encounter: 6\' 4"  (1.93 m). ?  Weight as of this encounter: 104.3 kg. ? ? ?DVT prophylaxis: Lovenox ?Code Status: Full code ?Family Communication: Care discussed with patient.  ?Disposition Plan:  ?Home on 5/13 if CT head negative  ? ? ?Consultants:  ?Cardiology ?Nephrology  ? ?Procedures:  ?ECHO ?Renal US ? ?Antimicrobials:  ? ? ?Subjective: ? ?Reports Headache is chronic for at least a few month if not longer, reports have a sleep study scheduled  ? no chest pain, no sob ?Right leg edema for a few months, today no significant leg edema ?Cr/k remain elevated ? ? ?Objective: ?Vitals:  ? 01/18/22 0648 01/18/22 1348 01/18/22 2204 01/19/22 0609  ?BP: (!) 147/77 133/81 (!) 153/92 (!) 162/82  ?Pulse: 73 70 74 68  ?Resp: 18 18 20 20   ?Temp: 97.9 ?F (36.6 ?C) 98.3 ?F (36.8 ?C) 98.2 ?F (36.8 ?C) 98 ?F (36.7 ?C)  ?TempSrc: Oral Oral Oral Oral  ?SpO2: 99% 95% 100% 100%  ?Weight:      ?Height:      ? ? ?Intake/Output Summary (Last 24 hours) at 01/19/2022 1705 ?Last data filed at 01/19/2022 1010 ?Gross per 24 hour  ?Intake 480 ml  ?Output 2100 ml  ?Net -1620 ml  ? ?Filed Weights  ? 01/15/22 0002  ?Weight: 104.3 kg  ? ? ?Examination: ? ?General exam: Appears calm  and comfortable  ?Respiratory system: Clear to auscultation. Respiratory effort normal. ?Cardiovascular system: S1 & S2 heard, RRR. No JVD, murmurs, rubs, gallops or clicks. No pedal edema. ?Gastrointestinal system: Abdomen is nondistended, soft and nontender. No organomegaly or masses felt. Normal bowel sounds heard. ?Central nervous system: Alert and oriented. No focal neurological deficits. ?Extremities: Left BKA, right lower extremity edema has much improved  ? ? ?Data Reviewed: I have personally reviewed following labs and imaging studies ? ?CBC: ?Recent Labs  ?Lab 01/15/22 ?0001 01/16/22 ?0415 01/17/22 ?0410  ?WBC 9.8 9.3 8.6   ?NEUTROABS  --  6.6  --   ?HGB 9.7* 9.0* 9.3*  ?HCT 30.5* 29.0* 30.8*  ?MCV 95.9 97.0 98.1  ?PLT 184 183 179  ? ?Basic Metabolic Panel: ?Recent Labs  ?Lab 01/15/22 ?1157 01/16/22 ?0415 01/17/22 ?0410 01/18/22 ?3846 01/19/22 ?6599  ?NA 144 144 142 141 140  ?K 4.9 4.9 5.3* 5.4* 5.1  ?CL 116* 114* 112* 112* 112*  ?CO2 22 21* 22 23 22   ?GLUCOSE 141* 162* 151* 153* 166*  ?BUN 43* 47* 52* 61* 56*  ?CREATININE 3.66* 4.07* 4.74* 4.63* 4.63*  ?CALCIUM 7.6* 7.4* 7.8* 8.2* 8.2*  ?MG  --  1.6*  --   --   --   ?PHOS  --  5.7*  --  5.9* 5.5*  ? ?GFR: ?Estimated Creatinine Clearance: 25.6 mL/min (A) (by C-G formula based on SCr of 4.63 mg/dL (H)). ?Liver Function Tests: ?Recent Labs  ?Lab 01/16/22 ?0415 01/18/22 ?0418 01/19/22 ?3570  ?AST 10*  --   --   ?ALT 11  --   --   ?ALKPHOS 70  --   --   ?BILITOT 0.3  --   --   ?PROT 6.5  --   --   ?ALBUMIN 2.8* 2.8* 2.8*  ? ?No results for input(s): LIPASE, AMYLASE in the last 168 hours. ?No results for input(s): AMMONIA in the last 168 hours. ?Coagulation Profile: ?No results for input(s): INR, PROTIME in the last 168 hours. ?Cardiac Enzymes: ?No results for input(s): CKTOTAL, CKMB, CKMBINDEX, TROPONINI in the last 168 hours. ?BNP (last 3 results) ?No results for input(s): PROBNP in the last 8760 hours. ?HbA1C: ?No results for input(s): HGBA1C in the last 72 hours. ? ?CBG: ?Recent Labs  ?Lab 01/18/22 ?1657 01/18/22 ?2202 01/19/22 ?1779 01/19/22 ?1222 01/19/22 ?3903  ?GLUCAP 147* 128* 123* 181* 137*  ? ?Lipid Profile: ?No results for input(s): CHOL, HDL, LDLCALC, TRIG, CHOLHDL, LDLDIRECT in the last 72 hours. ?Thyroid Function Tests: ?No results for input(s): TSH, T4TOTAL, FREET4, T3FREE, THYROIDAB in the last 72 hours. ?Anemia Panel: ?No results for input(s): VITAMINB12, FOLATE, FERRITIN, TIBC, IRON, RETICCTPCT in the last 72 hours. ? ?Sepsis Labs: ?No results for input(s): PROCALCITON, LATICACIDVEN in the last 168 hours. ? ?No results found for this or any previous visit (from the past  240 hour(s)).  ? ? ? ? ? ?Radiology Studies: ?DG Abd 1 View ? ?Result Date: 01/18/2022 ?CLINICAL DATA:  Nausea, vomiting. EXAM: ABDOMEN - 1 VIEW COMPARISON:  September 22, 2021. FINDINGS: The bowel gas pattern is normal. No radio-opaque calculi or other significant radiographic abnormality are seen. IMPRESSION: Negative. Electronically Signed   By: Marijo Conception M.D.   On: 01/18/2022 10:20   ? ? ? ? ? ?Scheduled Meds: ? aspirin EC  81 mg Oral Daily  ? atorvastatin  80 mg Oral Daily  ? carvedilol  25 mg Oral BID WC  ? enoxaparin (LOVENOX) injection  30 mg Subcutaneous Q24H  ?  hydrALAZINE  100 mg Oral TID  ? insulin aspart  0-15 Units Subcutaneous TID WC  ? insulin aspart  0-5 Units Subcutaneous QHS  ? isosorbide mononitrate  60 mg Oral Daily  ? pantoprazole  40 mg Oral BID  ? polyethylene glycol  17 g Oral Daily  ? senna-docusate  1 tablet Oral BID  ? technetium albumin aggregated  4.3 millicurie Intravenous Once  ? ?Continuous Infusions: ? ? ? ? LOS: 3 days  ? ? ? ? ?Florencia Reasons, MD PhD FACP ?[facility] ? ? ?If 7PM-7AM, please contact night-coverage ?[email or web address] ? ?01/19/2022, 5:05 PM   ?

## 2022-01-20 DIAGNOSIS — E119 Type 2 diabetes mellitus without complications: Secondary | ICD-10-CM | POA: Diagnosis not present

## 2022-01-20 DIAGNOSIS — N184 Chronic kidney disease, stage 4 (severe): Secondary | ICD-10-CM

## 2022-01-20 DIAGNOSIS — Z89512 Acquired absence of left leg below knee: Secondary | ICD-10-CM | POA: Diagnosis not present

## 2022-01-20 DIAGNOSIS — Z794 Long term (current) use of insulin: Secondary | ICD-10-CM

## 2022-01-20 DIAGNOSIS — Z951 Presence of aortocoronary bypass graft: Secondary | ICD-10-CM

## 2022-01-20 DIAGNOSIS — R079 Chest pain, unspecified: Secondary | ICD-10-CM | POA: Diagnosis not present

## 2022-01-20 DIAGNOSIS — N179 Acute kidney failure, unspecified: Secondary | ICD-10-CM | POA: Diagnosis not present

## 2022-01-20 LAB — GLUCOSE, CAPILLARY: Glucose-Capillary: 121 mg/dL — ABNORMAL HIGH (ref 70–99)

## 2022-01-20 MED ORDER — SEMAGLUTIDE(0.25 OR 0.5MG/DOS) 2 MG/1.5ML ~~LOC~~ SOPN: 0.2500 mg | PEN_INJECTOR | SUBCUTANEOUS | Status: DC

## 2022-01-20 MED ORDER — HYDRALAZINE HCL 100 MG PO TABS: 100.0000 mg | ORAL_TABLET | Freq: Three times a day (TID) | ORAL | 0 refills | Status: DC

## 2022-01-20 MED ORDER — ISOSORBIDE MONONITRATE ER 60 MG PO TB24: 60.0000 mg | ORAL_TABLET | Freq: Every day | ORAL | 0 refills | Status: DC

## 2022-01-20 MED ORDER — PANTOPRAZOLE SODIUM 40 MG PO TBEC: 40.0000 mg | DELAYED_RELEASE_TABLET | Freq: Every day | ORAL | 0 refills | Status: DC

## 2022-01-20 MED ORDER — ASPIRIN 81 MG PO TBEC: 81.0000 mg | DELAYED_RELEASE_TABLET | Freq: Every day | ORAL | 11 refills | Status: AC

## 2022-01-20 MED ORDER — ASPIRIN 81 MG PO TBEC: 81.0000 mg | DELAYED_RELEASE_TABLET | Freq: Every day | ORAL | 11 refills | Status: DC

## 2022-01-20 NOTE — Discharge Summary (Addendum)
?Discharge Summary ? ?Howard Mcmahon RCV:[id number] DOB: 05/06/1971 ? ?PCP: [place], [place] ? ?Admit date: 01/14/2022 ?Discharge date: 01/20/2022 ? ?Time spent:  71mins ? ?Recommendations for Outpatient Follow-up:  ?F/u with PCP within a week  for hospital discharge follow up, repeat cbc/bmp at follow up ?Follow-up with cardiology on 5/30 ?Follow-up with nephrology, report already scheduled to see nephrology in [facility] ?Ambulatory referral to dietitian for diabetes diet and renal diet education  ?Outpatient sleep study for chronic headache ? ?Patient is advised to check blood pressure at home, bring blood pressure record for PCP to review ? ?Unresulted Labs (From admission, onward)  ? ?  Start     Ordered  ? 01/17/22 1121  UPEP/UIFE/Light Chains/TP, 24-Hr Ur  Once,   R       ? 01/17/22 1121  ? ?  ?  ? ?  ?  ? ?Discharge Diagnoses:  ?Active Hospital Problems  ? Diagnosis Date Noted  ? Chest pain 08/26/2014  ?  ?Resolved Hospital Problems  ?No resolved problems to display.  ? ? ?Discharge Condition: stable ? ?Diet recommendation: heart healthy/carb modified/renal diet ? ?Filed Weights  ? 01/15/22 0002  ?Weight: 104.3 kg  ? ? ?History of present illness: (Per admitting provider Dr. Nevada Crane) ?Chief Complaint: Chest pain. ?  ?HPI: Howard Mcmahon is a 51 y.o. male with medical history significant for type 2 diabetes, peripheral vascular disease status post left BKA, OSA, chronic anxiety/depression, GERD, coronary artery disease, hyperlipidemia, who presented to [facility] ED due to chest pain while shopping.  Associated with shortness of breath.  Left-sided, nonradiating sharp 7 out of 10.  He took a nitroglycerin in his car and drove himself to the ED.  States this chest pain was different than prior chest chest pain that led to CABG in 2019, the pain he was having at that time was centrally located at that time and causing tingling to his arm.  Work-up in the ED revealed high-sensitivity troponin negative x2.  No evidence of  acute ischemia on twelve-lead EKG.  EDP requested admission for observation.  Admitted by hospitalist service. ?  ?ED Course: Tmax 98.  BP 172/85, pulse 84, respiratory rate 13, saturation 88% on room air.  Lab studies significant for WBC 9.8, hemoglobin 9.7 at baseline, MCV 95, platelet 184.  Serum bicarb 20, glucose 250, BUN 41, creatinine 3.77 with baseline of 2.48, GFR of 31.  BNP 225.  Chest x-ray mild pulmonary edema noted. ? ?Hospital Course:  ?Principal Problem: ?  Chest pain ? ? ?Assessment and Plan: ? ?Chest pain, hypertension urgency ?Troponin negative, EKG no acute changes ?Prior History CAD, s/p CABG ?Troponin negative.  ?Seen by cardiology, no plan for cardiac cath due to CKD ?-Recommend aspirin 81 mg daily, Coreg 25 mg twice a day, Lipitor 80 mg daily, Imdur 60 mg daily, hydralazine 100 mg 3 times daily ?-chest pain resolved ?-f/u with cardiology on 5/30 ? ?Ischemic cardiomyopathy/acute on chronic combined CHF ? -Echo 01/15/22 with LVEF 45-50%, mildly reduced LV function, mod LVH, grade I DD, nomral RV and PSAP, trivial MR ?-Chest x-ray with mild pulmonary edema ?-Seen by cardiology who recommend Continue Coreg, Imdur and hydralazine ?-Not a candidate for ACE/ARB/ARNI/SGLT 2i due to CKD ?-Lower extremity edema is resolved at discharge  ( venous doppler RLE negative for DVT), currently appears euvolemic. ? ?Insulin-dependent type 2 diabetes ?Appear controlled on current regimen, continue home regimen ?Follow-up with PCP ? ?   ?AKI on CKD stage IIIb/hyperkalemia  ?Creatinine baseline 2.4, cr  peaked at 4.6 ?Renal ultrasound: normal renal US.  ?Spep no m spike, slightly elevated kappa/lamda ration ?Upep pending ?Seen by nephrology: "No systemic s/s vasculitis to suggest GN and I suspect this is underlying DM + arterionephrosclerosis in light of comorbids.   Unclear etiology for AKI, ? Progression of CKD.  No hypovolemia, hypotension, nephrotoxins by history.  Cont supportive care - no indications for RRT  currently.  Discussed advanced CKD with him likely secondary to longstanding poorly controlled DM + arterionephrosclerosis.   Frankly discussed likely will need dialysis in the next year or 2 at best.  Vascular dz may preclude transplant, discussed. " ?-nephrology recommend outpatient nephrology follow up, patient reports already has on scheduled  ? ?   ?Anemia of chronic kidney disease: Hgb stable above 9, follow-up with PCP and nephrology ? ?Acid reflux, reports on protonix at home and it helped , continue  ?  ?  ?Headache, reports ongoing for several months, reports in the process of  getting outpatient sleep study arranged ?CT head no acute findings ?  ? h/o left BKA, currently wheelchair bound, awaiting to get prosthesis , f/u with [facility] ?Bilateral hand deformity, reports chronic , follow with [facility] ?  ? ?Discharge Exam: ?BP 122/70 (BP Location: Left Arm)   Pulse 69   Temp 98 ?F (36.7 ?C) (Oral)   Resp 18   Ht $R'6\' 4"'wf$  (1.93 m)   Wt 104.3 kg   SpO2 99%   BMI 28.00 kg/m?  ? ?General: NAD, aaox3, left BKA ?Cardiovascular: RRR ?Respiratory: Normal respiratory effort ? ? ? ? ?Discharge Instructions   ? ? Ambulatory referral to Nutrition and Diabetic Education   Complete by: As directed ?  ? Please also provide education on renal diet  ? Diet - low sodium heart healthy   Complete by: As directed ?  ? Low potassium, low phos renal diet ?Carb modified  ? Increase activity slowly   Complete by: As directed ?  ? ?  ? ?Allergies as of 01/20/2022   ? ?   Reactions  ? Lisinopril Swelling  ? Lactose Intolerance (gi)   ? Other Diarrhea  ? Lactulose intolerant ?Lactulose intolerant  ? ?  ? ?  ?Medication List  ?  ? ?STOP taking these medications   ? ?metFORMIN 500 MG 24 hr tablet ?Commonly known as: GLUCOPHAGE-XR ?  ? ?  ? ?TAKE these medications   ? ?acetaminophen 325 MG tablet ?Commonly known as: Tylenol ?Take 2 tablets (650 mg total) by mouth every 6 (six) hours as needed. ?What changed: reasons to  take this ?  ?aspirin 81 MG EC tablet ?Take 1 tablet (81 mg total) by mouth at bedtime. Swallow whole. ?  ?atorvastatin 80 MG tablet ?Commonly known as: LIPITOR ?Take 80 mg by mouth daily. ?  ?carvedilol 25 MG tablet ?Commonly known as: COREG ?Take 25 mg by mouth 2 (two) times daily. ?  ?hydrALAZINE 100 MG tablet ?Commonly known as: APRESOLINE ?Take 1 tablet (100 mg total) by mouth 3 (three) times daily. ?What changed:  ?medication strength ?how much to take ?  ?Insulin Pen Needle 30G X 8 MM Misc ?Commonly known as: NOVOFINE ?Inject 10 each into the skin as needed. ?What changed: reasons to take this ?  ?isosorbide mononitrate 60 MG 24 hr tablet ?Commonly known as: IMDUR ?Take 1 tablet (60 mg total) by mouth daily. ?Start taking on: Jan 21, 2022 ?  ?nitroGLYCERIN 0.4 MG SL tablet ?Commonly known as: NITROSTAT ?Place 0.4  mg under the tongue every 5 (five) minutes as needed for chest pain. ?  ?NovoLIN 70/30 Kwikpen (70-30) 100 UNIT/ML KwikPen ?Generic drug: insulin isophane & regular human KwikPen ?Inject 18 Units into the skin 2 (two) times daily. ?What changed: how much to take ?  ?pantoprazole 40 MG tablet ?Commonly known as: PROTONIX ?Take 1 tablet (40 mg total) by mouth daily. ?  ?Semaglutide(0.25 or 0.5MG /DOS) 2 MG/1.5ML Sopn ?Inject 0.25 mg into the skin once a week. Please follow up with your pcp regarding this meds ?What changed: additional instructions ?  ?True Metrix Blood Glucose Test test strip ?Generic drug: glucose blood ?Check blood sugar fasting and before meals and again if pt feels bad (symptoms of hypo). Check sugar three times a day ?  ?True Metrix Meter w/Device Kit ?Use as directed ?What changed:  ?how much to take ?how to take this ?when to take this ?additional instructions ?  ?Ventolin HFA 108 (90 Base) MCG/ACT inhaler ?Generic drug: albuterol ?Inhale 1-2 puffs into the lungs daily as needed for wheezing or shortness of breath. ?  ? ?  ? ?Allergies  ?Allergen Reactions  ? Lisinopril Swelling   ? Lactose Intolerance (Gi)   ? Other Diarrhea  ?  Lactulose intolerant ?Lactulose intolerant  ? ? Follow-up Information   ? ? Swinyer, Lanice Schwab, NP Follow up on 02/06/2022.   ?Specialty: Nurse Practitioner ?

## 2022-01-22 LAB — UPEP/UIFE/LIGHT CHAINS/TP, 24-HR UR
% BETA, Urine: 12.3 %
ALPHA 1 URINE: 6.6 %
Albumin, U: 58.6 %
Alpha 2, Urine: 5.4 %
Free Kappa Lt Chains,Ur: 320.74 mg/L — ABNORMAL HIGH (ref 1.17–86.46)
Free Kappa/Lambda Ratio: 6.16 (ref 1.83–14.26)
Free Lambda Lt Chains,Ur: 52.1 mg/L — ABNORMAL HIGH (ref 0.27–15.21)
GAMMA GLOBULIN URINE: 17.1 %
Total Protein, Urine-Ur/day: 7725 mg/24 hr — ABNORMAL HIGH (ref 30–150)
Total Protein, Urine: 309 mg/dL
Total Volume: 2500

## 2022-02-01 ENCOUNTER — Encounter: Payer: Self-pay | Admitting: Skilled Nursing Facility1

## 2022-02-01 ENCOUNTER — Encounter: Payer: 59 | Attending: Internal Medicine | Admitting: Skilled Nursing Facility1

## 2022-02-01 DIAGNOSIS — E1169 Type 2 diabetes mellitus with other specified complication: Secondary | ICD-10-CM

## 2022-02-01 DIAGNOSIS — N189 Chronic kidney disease, unspecified: Secondary | ICD-10-CM | POA: Insufficient documentation

## 2022-02-01 DIAGNOSIS — Z713 Dietary counseling and surveillance: Secondary | ICD-10-CM | POA: Diagnosis not present

## 2022-02-01 DIAGNOSIS — N179 Acute kidney failure, unspecified: Secondary | ICD-10-CM | POA: Diagnosis not present

## 2022-02-01 NOTE — Progress Notes (Signed)
DM medications: Semaglitide insulin  A1C 6.0 down from 12-14.  Pronounced Mosh-A  Pt states he needs a tooth pulled but cannot due to not being able to due to his heart.   Pt arrives in a wheel chair with a BKA stating he is working on getting a prosthesis. Pt states he has no trouble getting around just an issue with getting his own wheel chair out of the car.  Pt states he has a lot of friends and has one in particular friend he can call on if he needs help with anything. Pt states he loves to swim and wonders if he still can with his BKA.   Pt states he was first diagnosed with DM they told him to lose weight so he drank a gallon of water for 3 months and gto from 340 to 300 pounds and then did it again and is now 230 pounds. Pt states he does not want to continue the semaglitide due to a fear of too much weight loss.   Pt states his blood pressure this morning was 122/72 stating he checks his blood pressure daily after he eats.   Pt states he has lactose tolerance now.  Pt states he doe snot eat bread and knows he is not supposed to eat fried food and not eat pasta but he likes pasta.   Pt states he eats the hell out of cookies and sweets.  Pt states he does check his blood sugars daily before eating in the morning and in the evening before dinner: fasting: 120 and then before dinner 140.   Pt states he has a healthy sleep pattern. Pt states he will feel bad at blood sugar 80 stating once he started eating snacks he did not have any lows.   Pt states she has a girlfriend that they are currently on a break but still hangs out so they have most meals together and she tells him things he cannot and can eat, maybe a bit naggy so stressing him out but still a good influence for him.   Pt states he lives with his uncle who has alzheimer's and his mom who works third shift at [employer].  Pt states he worries about his mom because she does not hardly eat.  Pt sates he is still having chest  pains. Pt states he gets a lot of headaches.   Pt states he is trying to get an eye appt but there are no openings until October.   Pt states he has been wearing the same sock for about 3 weeks so not been bathing it and has not been bathing but has been doing a "bird bath" with soap and water: Dietitian will message his cardiologist since that is the next upcoming appt to see if they will check his foot. Pt states he is waiting on a new sleep study.   Pt states he has a lot of back pain.   Pt states he wakes 7am and starts eatign about 9am.    Goals: Snacks: -grapes in a snack bag -1 orange  -1 banana -kiwi -3/4 cup strawberries or blueberries or any other berry  Meals: -microwavable brown rice -frozen chicken thighs -frozen or canned (low sodium) vegetables  -packets of tuna -egg salad -chicken salad Make most of your meals home Water, water, water and then more water; cut back on sweet tea  Diabetes Self-Management Education  Visit Type: First/Initial  02/01/2022  Mr. Howard Mcmahon, identified by name and date of  birth, is a 51 y.o. male with a diagnosis of Diabetes: Type 2.   ASSESSMENT  There were no vitals taken for this visit. There is no height or weight on file to calculate BMI.   Diabetes Self-Management Education - 02/01/22 1206       Visit Information   Visit Type First/Initial      Initial Visit   Diabetes Type Type 2    Date Diagnosed many years ago    Are you currently following a meal plan? No    Are you taking your medications as prescribed? Yes      Health Coping   How would you rate your overall health? Fair      Psychosocial Assessment   Patient Belief/Attitude about Diabetes Motivated to manage diabetes    What is the hardest part about your diabetes right now, causing you the most concern, or is the most worrisome to you about your diabetes?   Making healty food and beverage choices    Self-care barriers Debilitated state due to current  medical condition;Unsteady gait/risk for falls;Low literacy    Self-management support Friends    Patient Concerns Nutrition/Meal planning    Special Needs Simplified materials;Verbal instruction    Preferred Learning Style Visual;Auditory    Learning Readiness Contemplating    How often do you need to have someone help you when you read instructions, pamphlets, or other written materials from your doctor or pharmacy? 3 - Sometimes      Pre-Education Assessment   Patient understands the diabetes disease and treatment process. Needs Instruction    Patient understands incorporating nutritional management into lifestyle. Needs Instruction    Patient undertands incorporating physical activity into lifestyle. Needs Instruction    Patient understands using medications safely. Needs Instruction    Patient understands monitoring blood glucose, interpreting and using results Needs Instruction    Patient understands prevention, detection, and treatment of acute complications. Needs Instruction    Patient understands prevention, detection, and treatment of chronic complications. Needs Instruction    Patient understands how to develop strategies to address psychosocial issues. Needs Instruction    Patient understands how to develop strategies to promote health/change behavior. Needs Instruction      Complications   Last HgB A1C per patient/outside source 6 %    How often do you check your blood sugar? 1-2 times/day    Fasting Blood glucose range (mg/dL) 130-179    Postprandial Blood glucose range (mg/dL) 180-200    Number of hypoglycemic episodes per month 0    Number of hyperglycemic episodes ( >200mg /dL): Occasional    Can you tell when your blood sugar is high? Yes    What do you do if your blood sugar is high? drnik water    Have you had a dilated eye exam in the past 12 months? No    Have you had a dental exam in the past 12 months? No    Are you checking your feet? No      Dietary Intake    Breakfast fats food: 2 biscuits    Lunch 2 sandwiches    Dinner fast food    Beverage(s) sweet tea, diet soda      Activity / Exercise   Activity / Exercise Type ADL's    How many days per week do you exercise? 0    How many minutes per day do you exercise? 0    Total minutes per week of exercise 0  Patient Education   Previous Diabetes Education No    Healthy Eating Role of diet in the treatment of diabetes and the relationship between the three main macronutrients and blood glucose level;Food label reading, portion sizes and measuring food.;Carbohydrate counting;Information on hints to eating out and maintain blood glucose control.    Being Active Role of exercise on diabetes management, blood pressure control and cardiac health.    Medications Taught/reviewed insulin/injectables, injection, site rotation, insulin/injectables storage and needle disposal.    Monitoring Taught/evaluated SMBG meter.;Purpose and frequency of SMBG.;Interpreting lab values - A1C, lipid, urine microalbumina.;Identified appropriate SMBG and/or A1C goals.    Acute complications Taught prevention, symptoms, and  treatment of hypoglycemia - the 15 rule.    Chronic complications Relationship between chronic complications and blood glucose control;Assessed and discussed foot care and prevention of foot problems;Dental care;Retinopathy and reason for yearly dilated eye exams;Nephropathy, what it is, prevention of, the use of ACE, ARB's and early detection of through urine microalbumia.    Diabetes Stress and Support Identified and addressed patients feelings and concerns about diabetes      Individualized Goals (developed by patient)   Nutrition Follow meal plan discussed;General guidelines for healthy choices and portions discussed    Physical Activity Exercise 5-7 days per week;15 minutes per day    Medications take my medication as prescribed    Problem Solving Addressing barriers to behavior change    Reducing  Risk do foot checks daily;treat hypoglycemia with 15 grams of carbs if blood glucose less than 70mg /dL    Health Coping Discuss barriers to diabetes care with support person/system (comment specifics as needed)      Post-Education Assessment   Patient understands the diabetes disease and treatment process. Demonstrates understanding / competency    Patient understands incorporating nutritional management into lifestyle. Demonstrates understanding / competency    Patient undertands incorporating physical activity into lifestyle. Demonstrates understanding / competency    Patient understands using medications safely. Demonstrates understanding / competency    Patient understands monitoring blood glucose, interpreting and using results Demonstrates understanding / competency    Patient understands prevention, detection, and treatment of acute complications. Demonstrates understanding / competency    Patient understands prevention, detection, and treatment of chronic complications. Demonstrates understanding / competency    Patient understands how to develop strategies to address psychosocial issues. Demonstrates understanding / competency    Patient understands how to develop strategies to promote health/change behavior. Demonstrates understanding / competency      Outcomes   Expected Outcomes Demonstrated interest in learning but significant barriers to change    Future DMSE 4-6 wks    Program Status Completed             Individualized Plan for Diabetes Self-Management Training:   Learning Objective:  Patient will have a greater understanding of diabetes self-management. Patient education plan is to attend individual and/or group sessions per assessed needs and concerns.   Expected Outcomes:  Demonstrated interest in learning but significant barriers to change  Education material provided: ADA - How to Thrive: A Guide for Your Journey with Diabetes, My Plate, and Snack sheet  If  problems or questions, patient to contact team via:  Phone  Future DSME appointment: 4-6 wks

## 2022-02-04 NOTE — Progress Notes (Deleted)
Cardiology Office Note:    Date:  02/04/2022   ID:  Howard Mcmahon, DOB October 10, 1970, MRN [id number]  PCP:  [facility], [place] Providers Cardiologist:  Werner Lean, MD { Click to update primary MD,subspecialty MD or APP then REFRESH:1}    Referring MD: [place], [place]   Chief Complaint: ***  History of Present Illness:    Howard Mcmahon is a *** 51 y.o. male with a hx of CAD with remote PCI to OM1 and mRCA, s/p CABG (LIMA-LAD, SVG-dRCA, SVG-OM2 in 2019), chronic HFpEF, CKD, OSA, obesity, diabetes with foot wounds, hypertension, hyperlipidemia, and depression with history of suicidal ideation  Hospitalized 09/08/2021 with sepsis and acute on chronic CKD, COVID infection and subsequently had left BKA 09/13/2021.  Plavix was stopped at that time and he was discharged to inpatient rehab.  He was following with [facility] cardiology as of 06/2021. [facility] was consulted during patient's hospitalization at [facility] 5/8-5/13/23 for evaluation of chest pain. Last seen by cards 06/22/21 for chest pain relieved with nitro.  Imdur was increased and he underwent PET stress test 07/25/2021 with no evidence of inducible ischemia or scarring, LVEF 50%, diffuse microvascular disease.  He was maintained on aspirin, Plavix, Lipitor, Lasix, and Imdur.  He presented to [facility] ED on 01/15/2022 with reported chest pain x1 week that was not improved with nitroglycerin.  He reported chest pain 2-3 times daily for the last 2 to 3 weeks, with pain lasting 10 to 20 minutes at the time.  He also reported some back pain.  BP was 179/102, EKG nonischemic, and cardiac enzymes were negative. No plan for cath since enzymes negative and pain resolved on Imdur. He was discharged home on aspirin, carvedilol 25 mg twice daily, Lipitor 80 mg, Imdur 60 mg, hydralazine 100 mg 3 times daily.  Today, he is here   Past Medical History:  Diagnosis Date   Chronic kidney disease    Diabetes mellitus  without complication ([place])    Hypertension    Macular infarction    2012   MI (myocardial infarction) ([place])    Peripheral vascular disease ([place])    Neuropathy   Pneumonia     Past Surgical History:  Procedure Laterality Date   CARDIAC SURGERY     CORONARY ANGIOPLASTY WITH STENT PLACEMENT     CORONARY ARTERY BYPASS GRAFT     HIP ARTHROPLASTY Right     Current Medications: No outpatient medications have been marked as taking for the 02/06/22 encounter (Appointment) with Ann Maki, Lanice Schwab, NP.     Allergies:   Lisinopril, Lactose intolerance (gi), and Other   Social History   Socioeconomic History   Marital status: Widowed    Spouse name: Not on file   Number of children: Not on file   Years of education: Not on file   Highest education level: Not on file  Occupational History   Not on file  Tobacco Use   Smoking status: Former    Packs/day: 2.00    Years: 30.00    Pack years: 60.00    Types: Cigarettes   Smokeless tobacco: Current  Vaping Use   Vaping Use: Never used  Substance and Sexual Activity   Alcohol use: No   Drug use: No   Sexual activity: Not on file  Other Topics Concern   Not on file  Social History Narrative   Not on file   Social Determinants of Health   Financial Resource Strain: Not  on file  Food Insecurity: Not on file  Transportation Needs: Not on file  Physical Activity: Not on file  Stress: Not on file  Social Connections: Not on file     Family History: The patient's ***family history includes Stroke in his mother.  ROS:   Please see the history of present illness.    *** All other systems reviewed and are negative.  Labs/Other Studies Reviewed:    The following studies were reviewed today:  Echo 01/15/22   1. Left ventricular ejection fraction, by estimation, is 40 to 45%. The  left ventricle has mildly decreased function. The left ventricle  demonstrates global hypokinesis. There is moderate left ventricular   hypertrophy. Left ventricular diastolic  parameters are consistent with Grade I diastolic dysfunction (impaired  relaxation).   2. Right ventricular systolic function is normal. The right ventricular  size is normal. There is normal pulmonary artery systolic pressure.   3. The mitral valve is normal in structure. Trivial mitral valve  regurgitation. No evidence of mitral stenosis.   4. The aortic valve is normal in structure. Aortic valve regurgitation is  not visualized. No aortic stenosis is present.   5. The inferior vena cava is normal in size with greater than 50%  respiratory variability, suggesting right atrial pressure of 3 mmHg.   PET stress 07/25/21: 1.  No evidence of inducible ischemia or scarring.  2.  Left ventricular ejection fraction is 50%%.  3.  Decreased coronary flow reserve in all territories and decreased myocardial blood flow compatible with diffuse microvascular disease.    NM SPECT 03/2020: FINDINGS:  There are no areas of decreased uptake to suggest ischemia or infarct.   Mild hypokinesia.   Calculated ejection fraction is 40%.    Recent Labs: 01/15/2022: B Natriuretic Peptide 225.1 01/16/2022: ALT 11; Magnesium 1.6 01/17/2022: Hemoglobin 9.3; Platelets 179 01/19/2022: BUN 56; Creatinine, Ser 4.63; Potassium 5.1; Sodium 140  Recent Lipid Panel    Component Value Date/Time   CHOL 224 (H) 01/02/2019 1014   TRIG 1,070 (HH) 01/02/2019 1014   HDL 19 (L) 01/02/2019 1014   CHOLHDL 11.8 (H) 01/02/2019 1014   CHOLHDL NOT REPORTED DUE TO HIGH TRIGLYCERIDES 08/27/2014 0327   VLDL UNABLE TO CALCULATE IF TRIGLYCERIDE OVER 400 mg/dL 08/27/2014 0327   LDLCALC Comment 01/02/2019 1014     Risk Assessment/Calculations:   {Does this patient have ATRIAL FIBRILLATION?:[phone]}       Physical Exam:    VS:  There were no vitals taken for this visit.    Wt Readings from Last 3 Encounters:  01/15/22 230 lb (104.3 kg)  11/03/21 271 lb 2.7 oz (123 kg)  05/21/21  271 lb (122.9 kg)     GEN: *** Well nourished, well developed in no acute distress HEENT: Normal NECK: No JVD; No carotid bruits CARDIAC: ***RRR, no murmurs, rubs, gallops RESPIRATORY:  Clear to auscultation without rales, wheezing or rhonchi  ABDOMEN: Soft, non-tender, non-distended MUSCULOSKELETAL:  No edema; No deformity. *** pedal pulses, ***bilaterally SKIN: Warm and dry NEUROLOGIC:  Alert and oriented x 3 PSYCHIATRIC:  Normal affect   EKG:  EKG is *** ordered today.  The ekg ordered today demonstrates ***  Diagnoses:    No diagnosis found. Assessment and Plan:     CAD   {Are you ordering a CV Procedure (e.g. stress test, cath, DCCV, TEE, etc)?   Press F2        :[id number]}    Medication Adjustments/Labs and Tests Ordered: Current medicines are  reviewed at length with the patient today.  Concerns regarding medicines are outlined above.  No orders of the defined types were placed in this encounter.  No orders of the defined types were placed in this encounter.   There are no Patient Instructions on file for this visit.   Signed, Emmaline Life, NP  02/04/2022 9:07 AM    [place]

## 2022-02-06 ENCOUNTER — Ambulatory Visit: Payer: 59 | Admitting: Nurse Practitioner

## 2022-03-05 ENCOUNTER — Ambulatory Visit: Payer: 59 | Admitting: Skilled Nursing Facility1

## 2022-03-12 ENCOUNTER — Ambulatory Visit: Payer: 59 | Admitting: Skilled Nursing Facility1

## 2022-03-19 ENCOUNTER — Encounter: Payer: 59 | Attending: Internal Medicine | Admitting: Skilled Nursing Facility1

## 2022-03-19 DIAGNOSIS — E1169 Type 2 diabetes mellitus with other specified complication: Secondary | ICD-10-CM

## 2022-03-19 DIAGNOSIS — N189 Chronic kidney disease, unspecified: Secondary | ICD-10-CM

## 2022-03-19 DIAGNOSIS — N179 Acute kidney failure, unspecified: Secondary | ICD-10-CM | POA: Diagnosis not present

## 2022-03-19 DIAGNOSIS — Z713 Dietary counseling and surveillance: Secondary | ICD-10-CM | POA: Insufficient documentation

## 2022-03-19 NOTE — Progress Notes (Signed)
Medical Nutrition Therapy    NUTRITION ASSESSMENT    Clinical Medical Hx: DM, CKD, BKA Medications: see list Labs: A1C 6.2, hyperkalemia hx Notable Signs/Symptoms:   Lifestyle & Dietary Hx  Pt arrived with his supportive girlfriend Nichole who is pregnant.   DM medications: Semaglitide insulin  A1C 6.2 down from 12-14.  Pronounced Mosh-A  Pt states he needs a tooth pulled but cannot due to not being able to due to his heart.   Pt arrives in a wheel chair with a BKA stating he is working on getting a prosthesis. Pt states he has no trouble getting around just an issue with getting his own wheel chair out of the car.  Pt states he has a lot of friends and has one in particular friend he can call on if he needs help with anything. Pt states he loves to swim and wonders if he still can with his BKA.    Pt states his blood pressure this morning was 122/72 stating he checks his blood pressure daily after he eats.   Pt states he has lactose tolerance now.  Pt states he does not eat bread and knows he is not supposed to eat fried food and not eat pasta but he likes pasta.   Pt states he eats the hell out of cookies and sweets.  Pt states he does check his blood sugars daily before eating in the morning and in the evening before dinner: fasting: 120 and then before dinner 140.    Pt sates he did get the doctor to look at his foot and he has removed his sock to clean his foot. Pt states he has been working to get his prosthesis and is about to get it placed.  Pt states he quit smoking about 4 years ago.  In reading nephrology note pt has hyperkalemia.  Pt states he is worried to drink water because he is going to crave it and lsoe too much weight.    Pt is having a hard time making dietary changes as they are new also has trouble with the concept of balance for example if something is healthy he can eat or drink as much of it as he wants so Dietitian worked with pt on this but  suspect will need to reiterate at next appt.    Estimated daily fluid intake: unknown oz Supplements:  Sleep:  Stress / self-care: limited self care Current average weekly physical activity: ADL's  24 hr recall: Breakfast: fruity pebbles  Snack: Lunch: skipped Snack: Dinner: baked chicken Snack:  Beverages: apple juice, lactaid milk  NUTRITION INTERVENTION  Nutrition education (E-1) on the following topics:  Blood sugar control and avoidance of sugary drinks Hyperkalemia  Acidosis and fruits/vegetables   Handouts Provided Include  Meal ideas sheet  Learning Style & Readiness for Change Teaching method utilized: Visual & Auditory  Demonstrated degree of understanding via: Teach Back  Barriers to learning/adherence to lifestyle change: difficulty making dietary changes  Goals Established by Pt Make 3 meals a day Drink 64 ounces of water per day: do not drink a gallon   MONITORING & EVALUATION Dietary intake, weekly physical activity  Next Steps  Patient is to follow up in 6 weeks.

## 2022-05-02 ENCOUNTER — Ambulatory Visit: Payer: 59 | Admitting: Skilled Nursing Facility1

## 2022-05-24 ENCOUNTER — Encounter (HOSPITAL_COMMUNITY): Payer: Self-pay | Admitting: Emergency Medicine

## 2022-05-24 ENCOUNTER — Emergency Department (HOSPITAL_COMMUNITY)
Admission: EM | Admit: 2022-05-24 | Discharge: 2022-05-24 | Disposition: A | Discharge status: Home / Self Care | Payer: Medicaid Other | Attending: Emergency Medicine | Admitting: Emergency Medicine

## 2022-05-24 DIAGNOSIS — E119 Type 2 diabetes mellitus without complications: Secondary | ICD-10-CM | POA: Insufficient documentation

## 2022-05-24 DIAGNOSIS — N4889 Other specified disorders of penis: Secondary | ICD-10-CM | POA: Diagnosis present

## 2022-05-24 DIAGNOSIS — K610 Anal abscess: Secondary | ICD-10-CM | POA: Insufficient documentation

## 2022-05-24 DIAGNOSIS — Z794 Long term (current) use of insulin: Secondary | ICD-10-CM | POA: Insufficient documentation

## 2022-05-24 DIAGNOSIS — Z7982 Long term (current) use of aspirin: Secondary | ICD-10-CM | POA: Diagnosis not present

## 2022-05-24 DIAGNOSIS — L02215 Cutaneous abscess of perineum: Secondary | ICD-10-CM

## 2022-05-24 MED ORDER — AMOXICILLIN-POT CLAVULANATE 875-125 MG PO TABS: 1.0000 | ORAL_TABLET | Freq: Two times a day (BID) | ORAL | 0 refills | Status: AC

## 2022-05-24 NOTE — ED Provider Notes (Signed)
[facility] DEPT Provider Note   CSN: [id number] Arrival date & time: 05/24/22  8937     History  Chief Complaint  Patient presents with   Abscess    Howard Mcmahon is a 51 y.o. male.  HPI 51 year old male presents with concern for perineal abscess.  He states that originally started about 5 days ago and it was quite painful to sit or touch.  At some point started draining and the drainage seemed and yesterday.  He states it might be a little sore when he touches it but he can sit and has no pain at rest.  No scrotal pain.  No systemic symptoms such as fever or chills.  His comorbidities include diabetes which she states has been well controlled recently.  He went to his doctor's office yesterday where they told him to go to the ER for concern he might need incision and drainage.   Home Medications Prior to Admission medications   Medication Sig Start Date End Date Taking? Authorizing Provider  acetaminophen (TYLENOL) 325 MG tablet Take 2 tablets (650 mg total) by mouth every 6 (six) hours as needed. Patient taking differently: Take 650 mg by mouth every 6 (six) hours as needed for mild pain. 08/14/21   Hendricks Limes, PA-C  aspirin 81 MG EC tablet Take 1 tablet (81 mg total) by mouth at bedtime. Swallow whole. 01/20/22   Florencia Reasons, MD  atorvastatin (LIPITOR) 80 MG tablet Take 80 mg by mouth daily. 10/03/21   [provider]  Blood Glucose Monitoring Suppl (TRUE METRIX METER) w/Device KIT Use as directed Patient taking differently: 1 each by Other route as directed. 01/02/19   Kerin Perna, NP  carvedilol (COREG) 25 MG tablet Take 25 mg by mouth 2 (two) times daily. 10/03/21   [provider]  glucose blood (TRUE METRIX BLOOD GLUCOSE TEST) test strip Check blood sugar fasting and before meals and again if pt feels bad (symptoms of hypo). Check sugar three times a day 10/10/19   Charlann Lange, PA-C  hydrALAZINE (APRESOLINE) 100 MG  tablet Take 1 tablet (100 mg total) by mouth 3 (three) times daily. 01/20/22   Florencia Reasons, MD  Insulin Isophane & Regular Human (NOVOLIN 70/30 FLEXPEN RELION) (70-30) 100 UNIT/ML PEN Inject 18 Units into the skin 2 (two) times daily. Patient taking differently: Inject 36 Units into the skin 2 (two) times daily. 10/20/19   Bonnielee Haff, MD  Insulin Pen Needle (NOVOFINE) 30G X 8 MM MISC Inject 10 each into the skin as needed. Patient taking differently: Inject 1 packet into the skin as needed (insulin). 10/20/19   Bonnielee Haff, MD  isosorbide mononitrate (IMDUR) 60 MG 24 hr tablet Take 1 tablet (60 mg total) by mouth daily. 01/21/22   Florencia Reasons, MD  nitroGLYCERIN (NITROSTAT) 0.4 MG SL tablet Place 0.4 mg under the tongue every 5 (five) minutes as needed for chest pain. 08/10/21   [provider]  pantoprazole (PROTONIX) 40 MG tablet Take 1 tablet (40 mg total) by mouth daily. 01/20/22   Florencia Reasons, MD  Semaglutide,0.25 or 0.5MG /DOS, 2 MG/1.5ML SOPN Inject 0.25 mg into the skin once a week. Please follow up with your pcp regarding this meds 01/20/22   Florencia Reasons, MD  VENTOLIN HFA [phone] Base) MCG/ACT inhaler Inhale 1-2 puffs into the lungs daily as needed for wheezing or shortness of breath. 03/02/21   [provider]      Allergies    Lisinopril, Lactose  intolerance (gi), and Other    Review of Systems   Review of Systems  Constitutional:  Negative for chills and fever.  Genitourinary:  Negative for scrotal swelling and testicular pain.  Skin:  Positive for wound.    Physical Exam Updated Vital Signs BP 135/84 (BP Location: Left Arm)   Pulse 80   Temp 97.9 F (36.6 C) (Oral)   Resp 20   SpO2 100%  Physical Exam Vitals and nursing note reviewed.  Constitutional:      Appearance: He is well-developed.  HENT:     Head: Normocephalic and atraumatic.  Abdominal:     General: There is no distension.  Genitourinary:   Skin:    General: Skin is warm and dry.     Findings: No  erythema.  Neurological:     Mental Status: He is alert.     ED Results / Procedures / Treatments   Labs (all labs ordered are listed, but only abnormal results are displayed) Labs Reviewed - No data to display  EKG None  Radiology No results found.  Procedures Ultrasound ED Soft Tissue  Date/Time: 05/24/2022 11:02 AM  Performed by: Sherwood Gambler, MD Authorized by: Sherwood Gambler, MD   Procedure details:    Indications: localization of abscess     Transverse view:  Visualized   Longitudinal view:  Visualized   Images: archived   Location:    Location: perineum     Side:  Left Findings:     no abscess present Comments:     No significant fluid collection seen     Medications Ordered in ED Medications - No data to display  ED Course/ Medical Decision Making/ A&P                           Medical Decision Making Amount and/or Complexity of Data Reviewed External Data Reviewed: notes.    Details: PCP visit yesterday  Risk Prescription drug management.   Bedside ultrasound was used as there is not an obvious fluid collection though I can see where he had the abscess before.  Seems like it mostly drained as the ultrasound does not show any obvious significant fluid collection.  Given this, will treat with warm compresses and Augmentin given his history of diabetes.  However my suspicion for a deep space infection such as Fournier gangrene is quite low.  He is well-appearing overall and has normal vitals.  I do not think incision/drainage is warranted.  He will be given return precautions.        Final Clinical Impression(s) / ED Diagnoses Final diagnoses:  Perineal abscess, superficial    Rx / DC Orders ED Discharge Orders     None         Sherwood Gambler, MD 05/24/22 1103

## 2022-05-24 NOTE — Discharge Instructions (Addendum)
It seems that most of your abscess is already drained.  Apply warm compresses multiple times per day and take the antibiotics.  You can also do sitz baths to help with this infection.  If at any point you develop fever, chills, new or worsening swelling or pain, or any other new/concerning symptoms then return to the ER or call 911.

## 2022-05-24 NOTE — ED Notes (Addendum)
Here for L perineal/scrotum abscess. First noticed Saturday, drained on its own Sunday. Denies pain, NVD, fever, sob, CP, abd or back pain. Denies known or obvious rectal, or testicular involvement. Not TTP. H/o neuropathy and DM. Minimal 1cm focal collection with minimal/possible redness. No current drainage, or induration.

## 2022-05-24 NOTE — ED Triage Notes (Signed)
Pt here from home with c/o od a small abscesses to his scrotum , pt went to his MD yesterday to have it looked at but they advised him to come to the ED

## 2022-05-24 NOTE — ED Notes (Addendum)
EDP at [facility] 

## 2022-05-30 IMAGING — CR DG CHEST 2V
2 series · 2 of 2 positions shown · non-contrast
Comparison: 01/07/2021

CLINICAL DATA: Chest pain.  Left-sided pain for 1 week.

EXAM:
CHEST - 2 VIEW

[w chest lat]
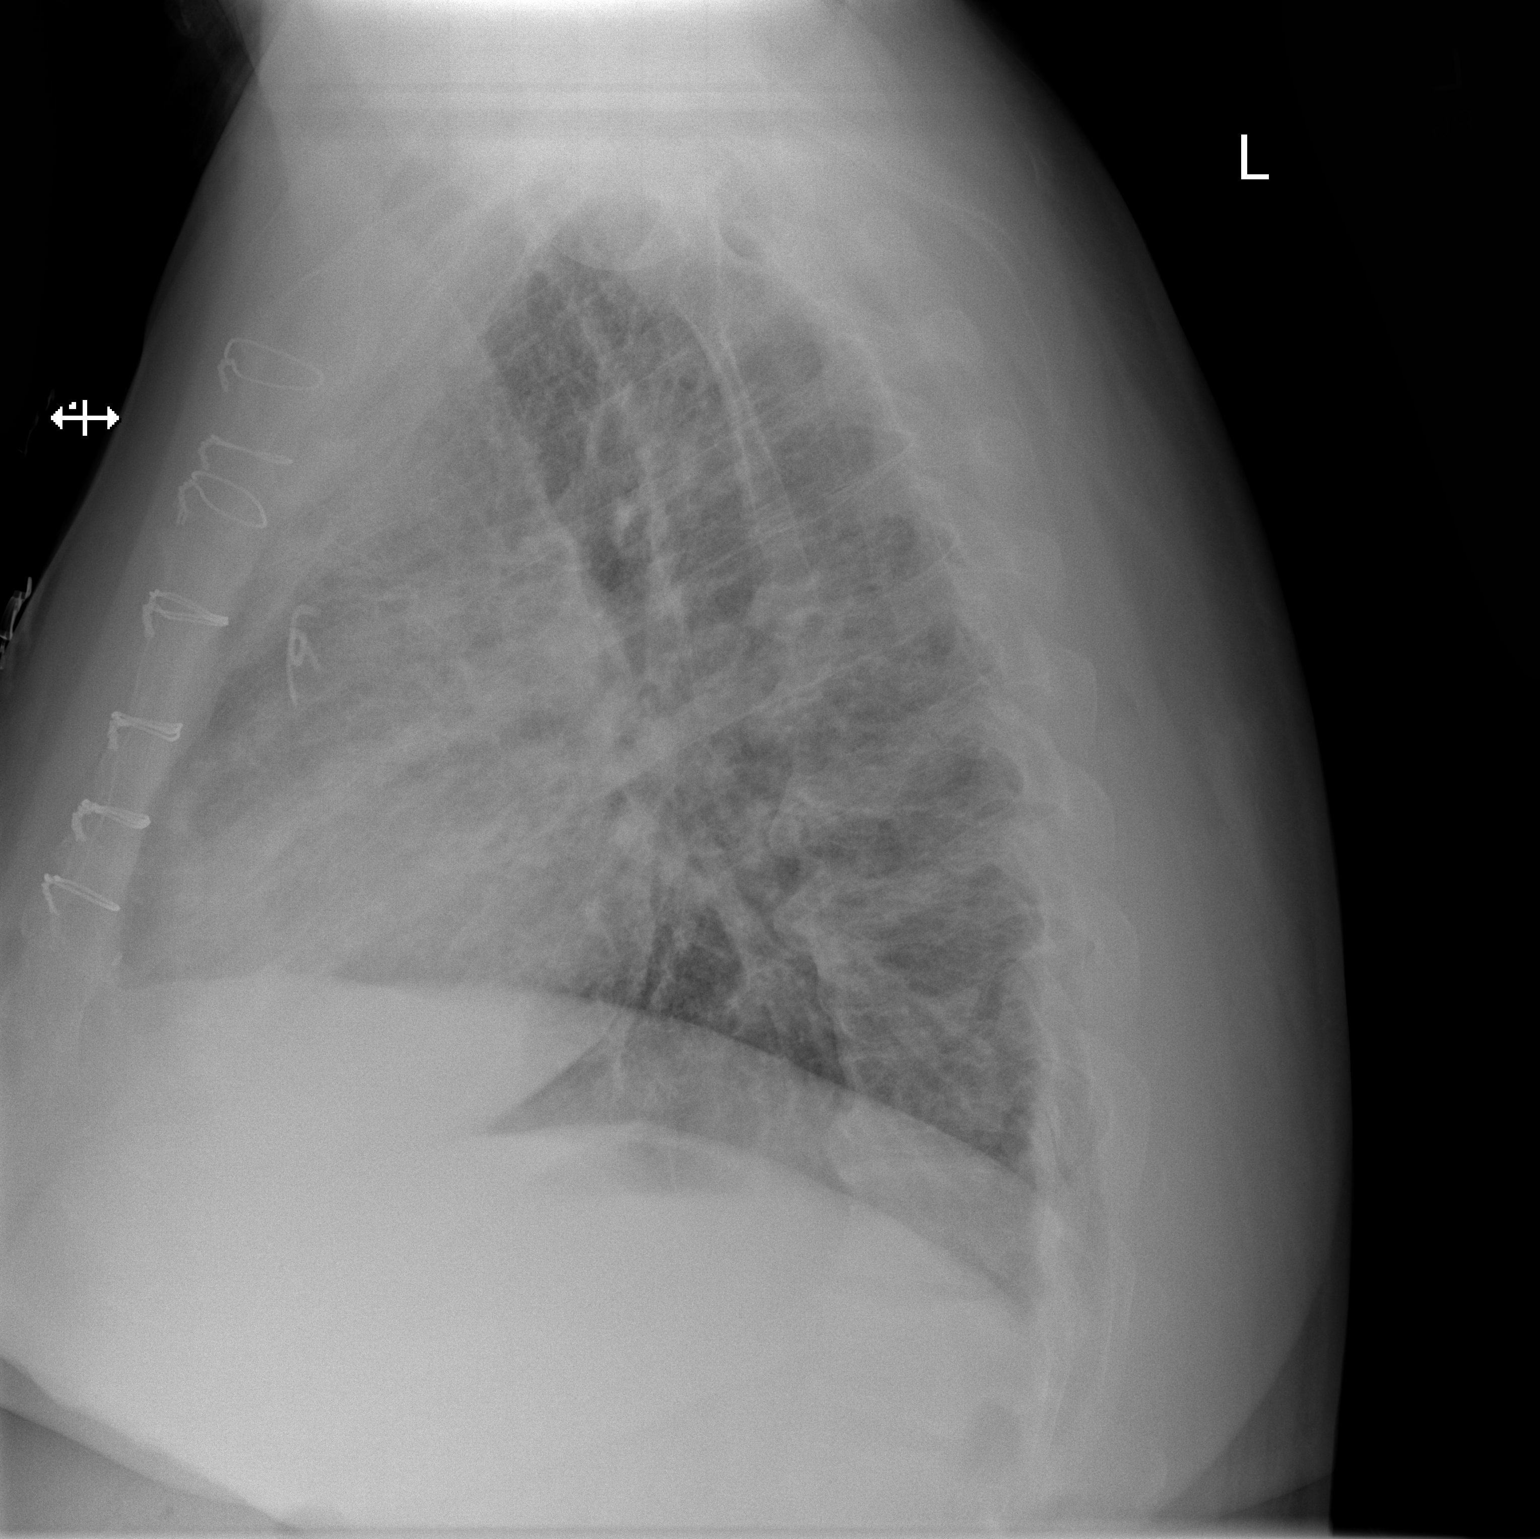

[w chest pa]
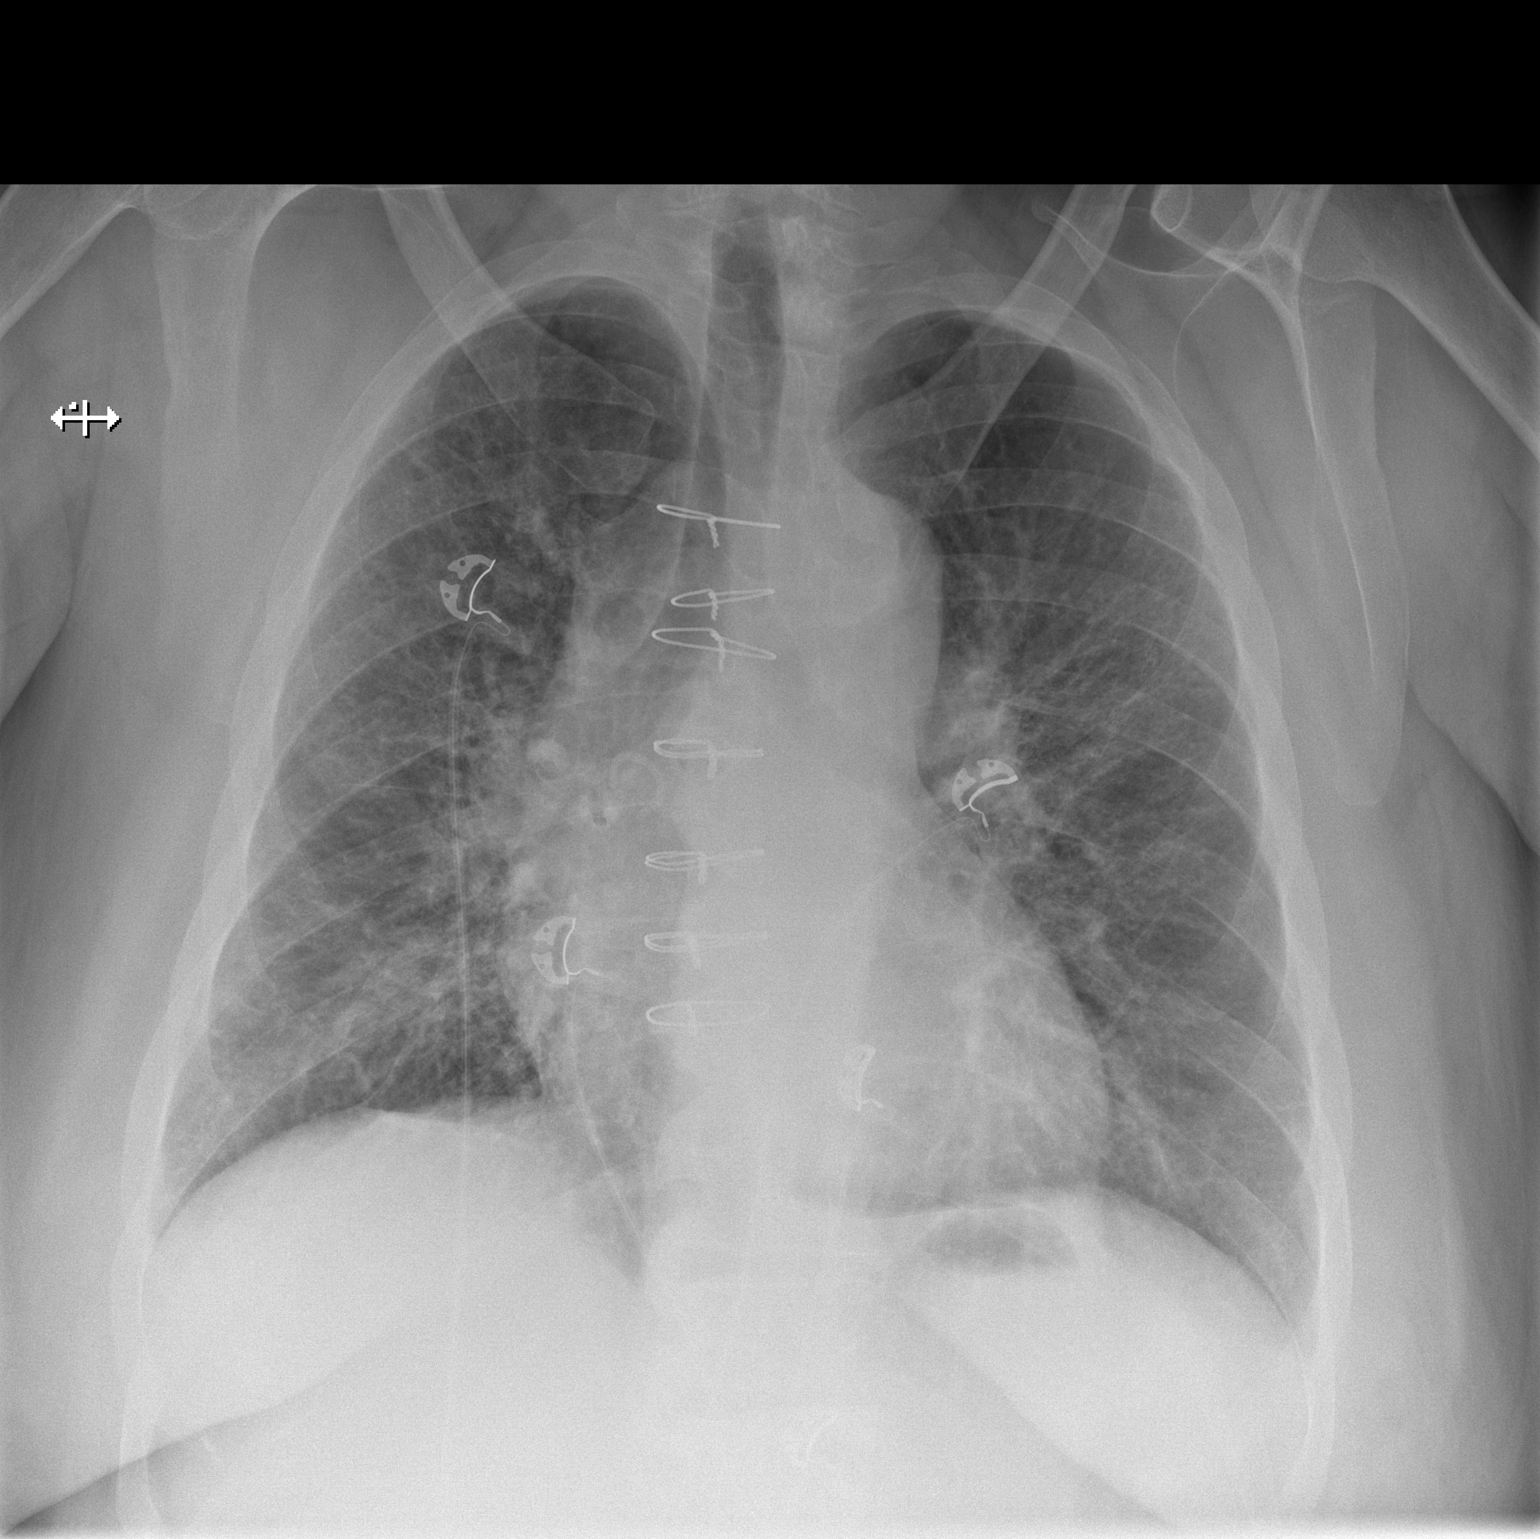

[2 of 2 positions shown; findings below may reference images not displayed]

FINDINGS: Post median sternotomy. Stable heart size and mediastinal contours.
There is diffuse peribronchial thickening that is increased from
prior exam, possible mild septal thickening. Confluent
consolidation, pleural effusion, or pneumothorax. Thoracic
spondylosis
IMPRESSION: Diffuse peribronchial thickening that is increased from prior exam,
possible mild pulmonary edema superimposed on chronic bronchial
thickening.

## 2022-05-31 IMAGING — US US RENAL
1 series · 14 of 25 positions shown · non-contrast
Comparison: CT abdomen 09/11/2021

CLINICAL DATA: Acute kidney injury

EXAM:
RENAL / URINARY TRACT ULTRASOUND COMPLETE

[Series 1: us renal · 14 of 44 slices shown]
[im 1/44]
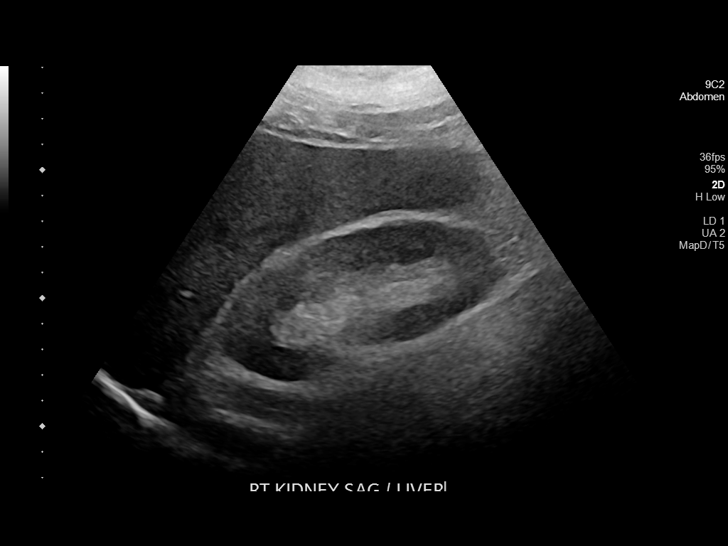
[im 4/44]
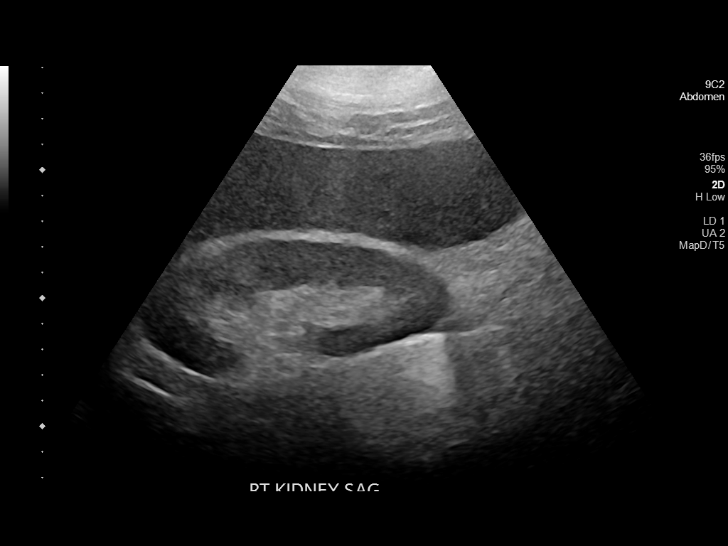
[im 8/44]
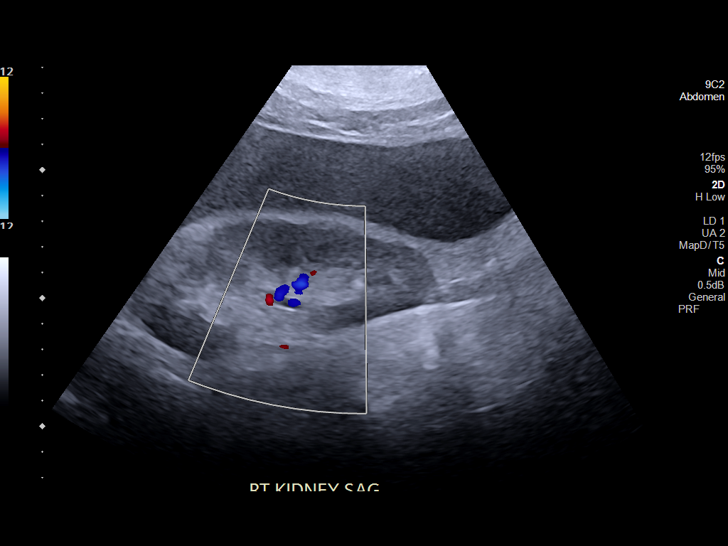
[im 11/44]
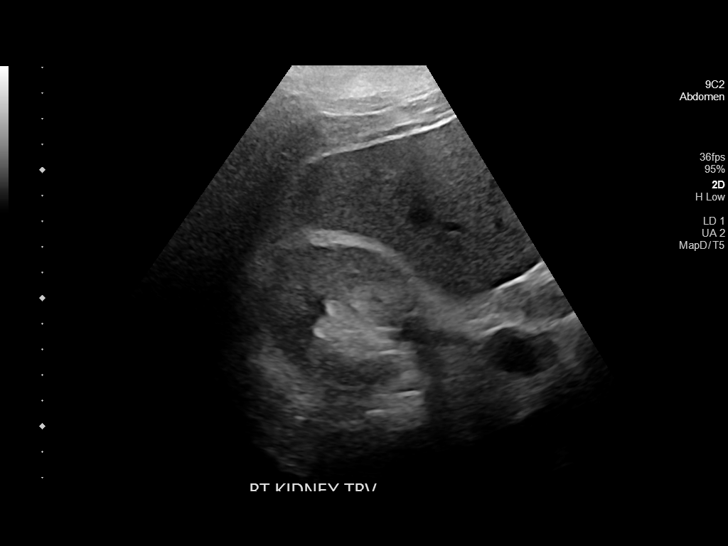
[im 15/44]
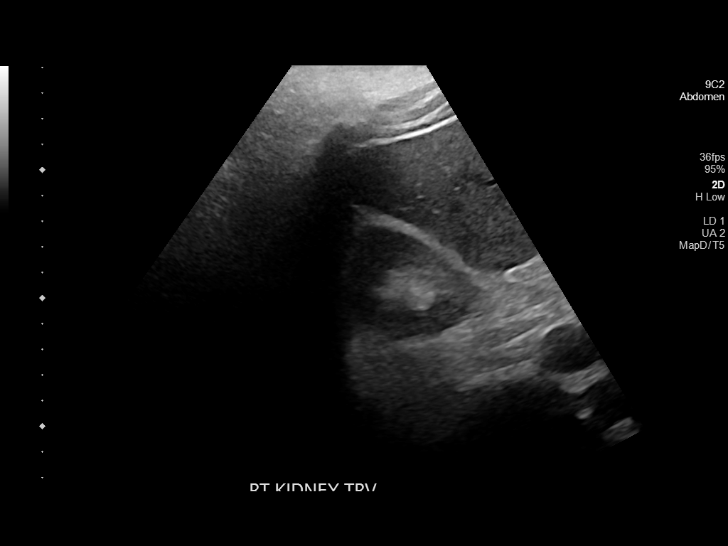
[im 17/44]
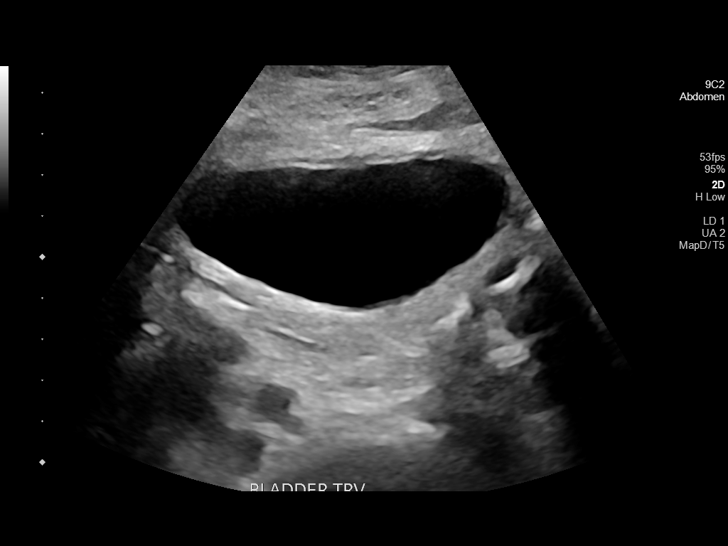
[im 20/44]
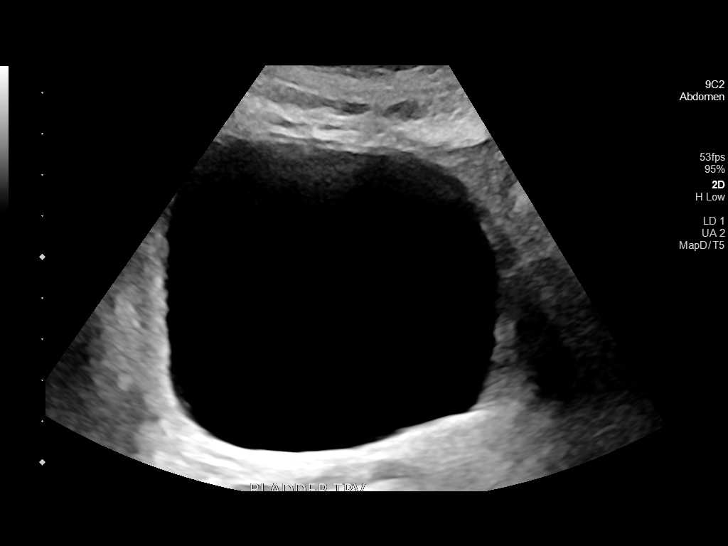
[im 24/44]
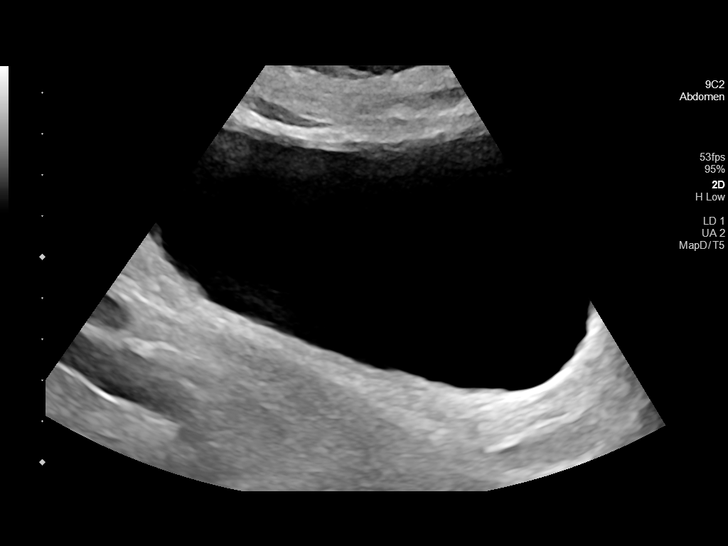
[im 27/44]
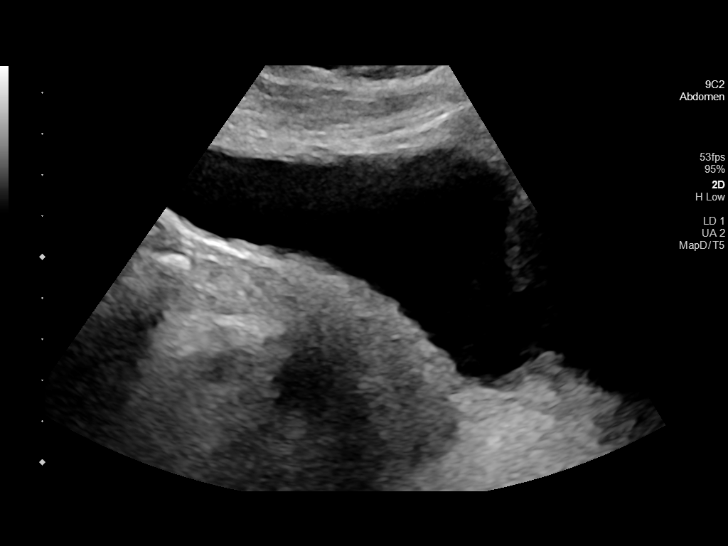
[im 29/44]
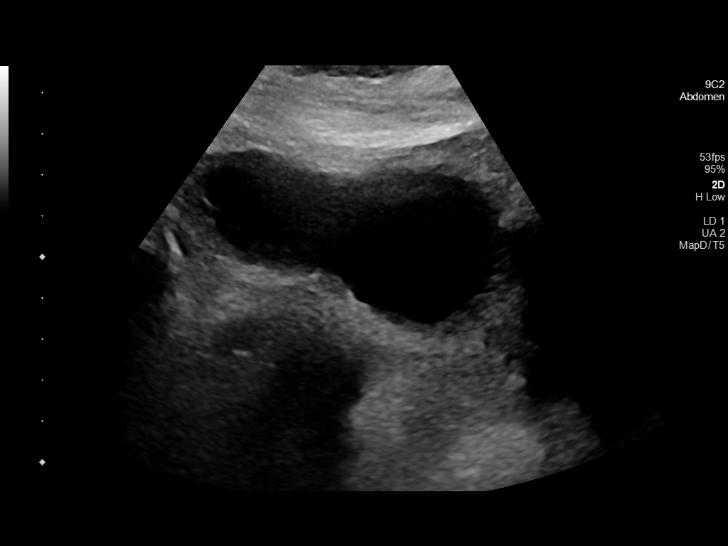
[im 33/44]
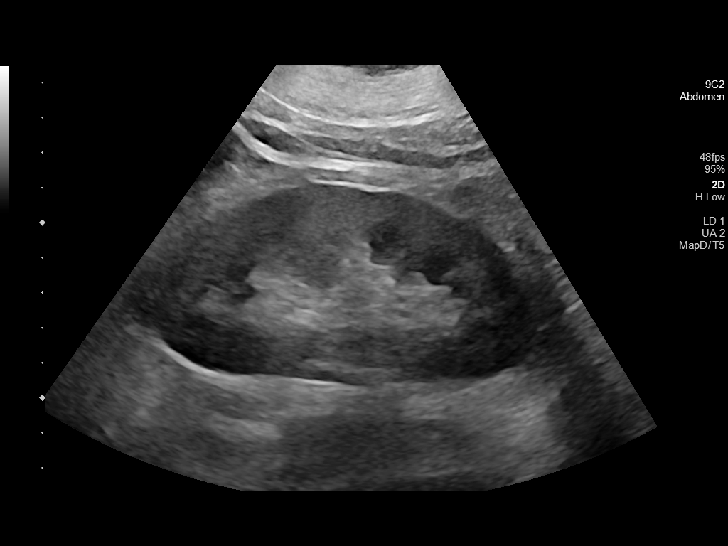
[im 36/44]
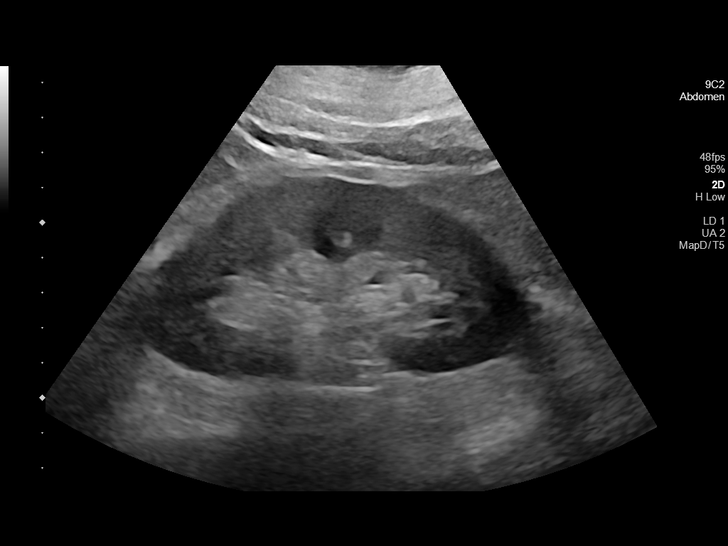
[im 40/44]
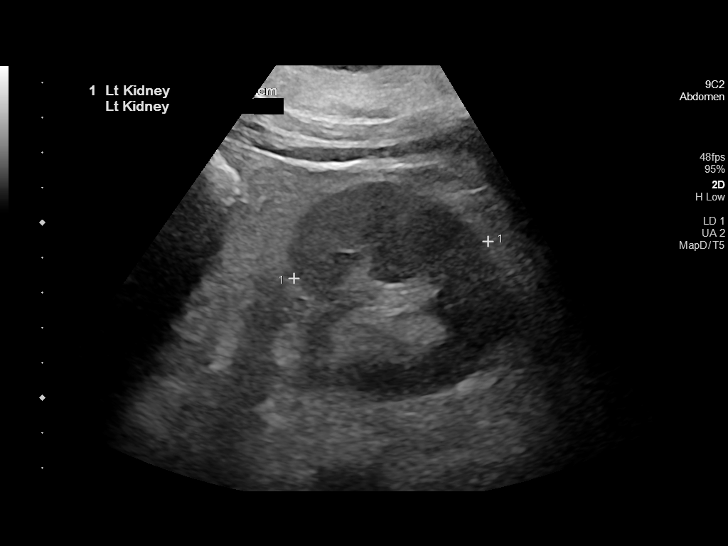
[im 44/44]
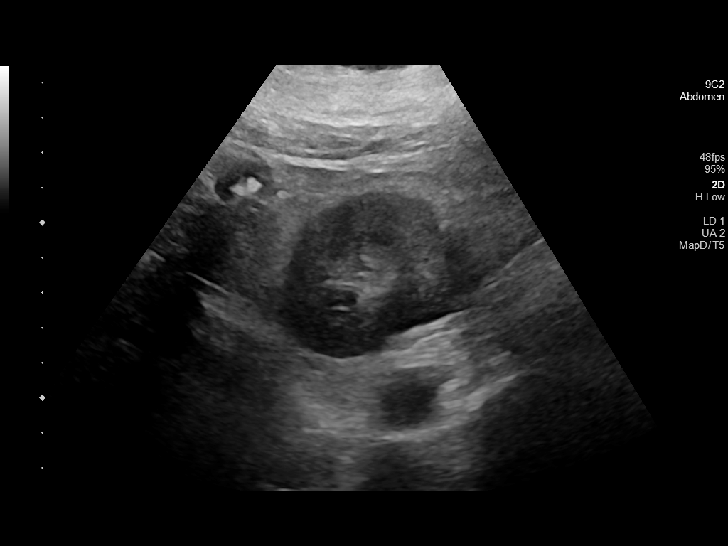

[14 of 25 positions shown; findings below may reference images not displayed]

FINDINGS: Right Kidney:

Renal measurements: 12.7 x 4.5 x 6.3 cm = volume: 187.8 mL.
Echogenicity within normal limits. No mass or hydronephrosis
visualized.

Left Kidney:

Renal measurements: 11.1 x 5.3 x 5.6 cm = volume: 172.3 mL.
Echogenicity within normal limits. No mass or hydronephrosis
visualized.

Bladder:

Appears normal for degree of bladder distention.

Other:

None.
IMPRESSION: Normal renal ultrasound.

## 2022-06-03 IMAGING — DX DG ABDOMEN 1V
2 series · 2 of 2 positions shown · non-contrast
Comparison: September 22, 2021.

CLINICAL DATA: Nausea, vomiting.

EXAM:
ABDOMEN - 1 VIEW

[abdomen kub (1 of 2)]
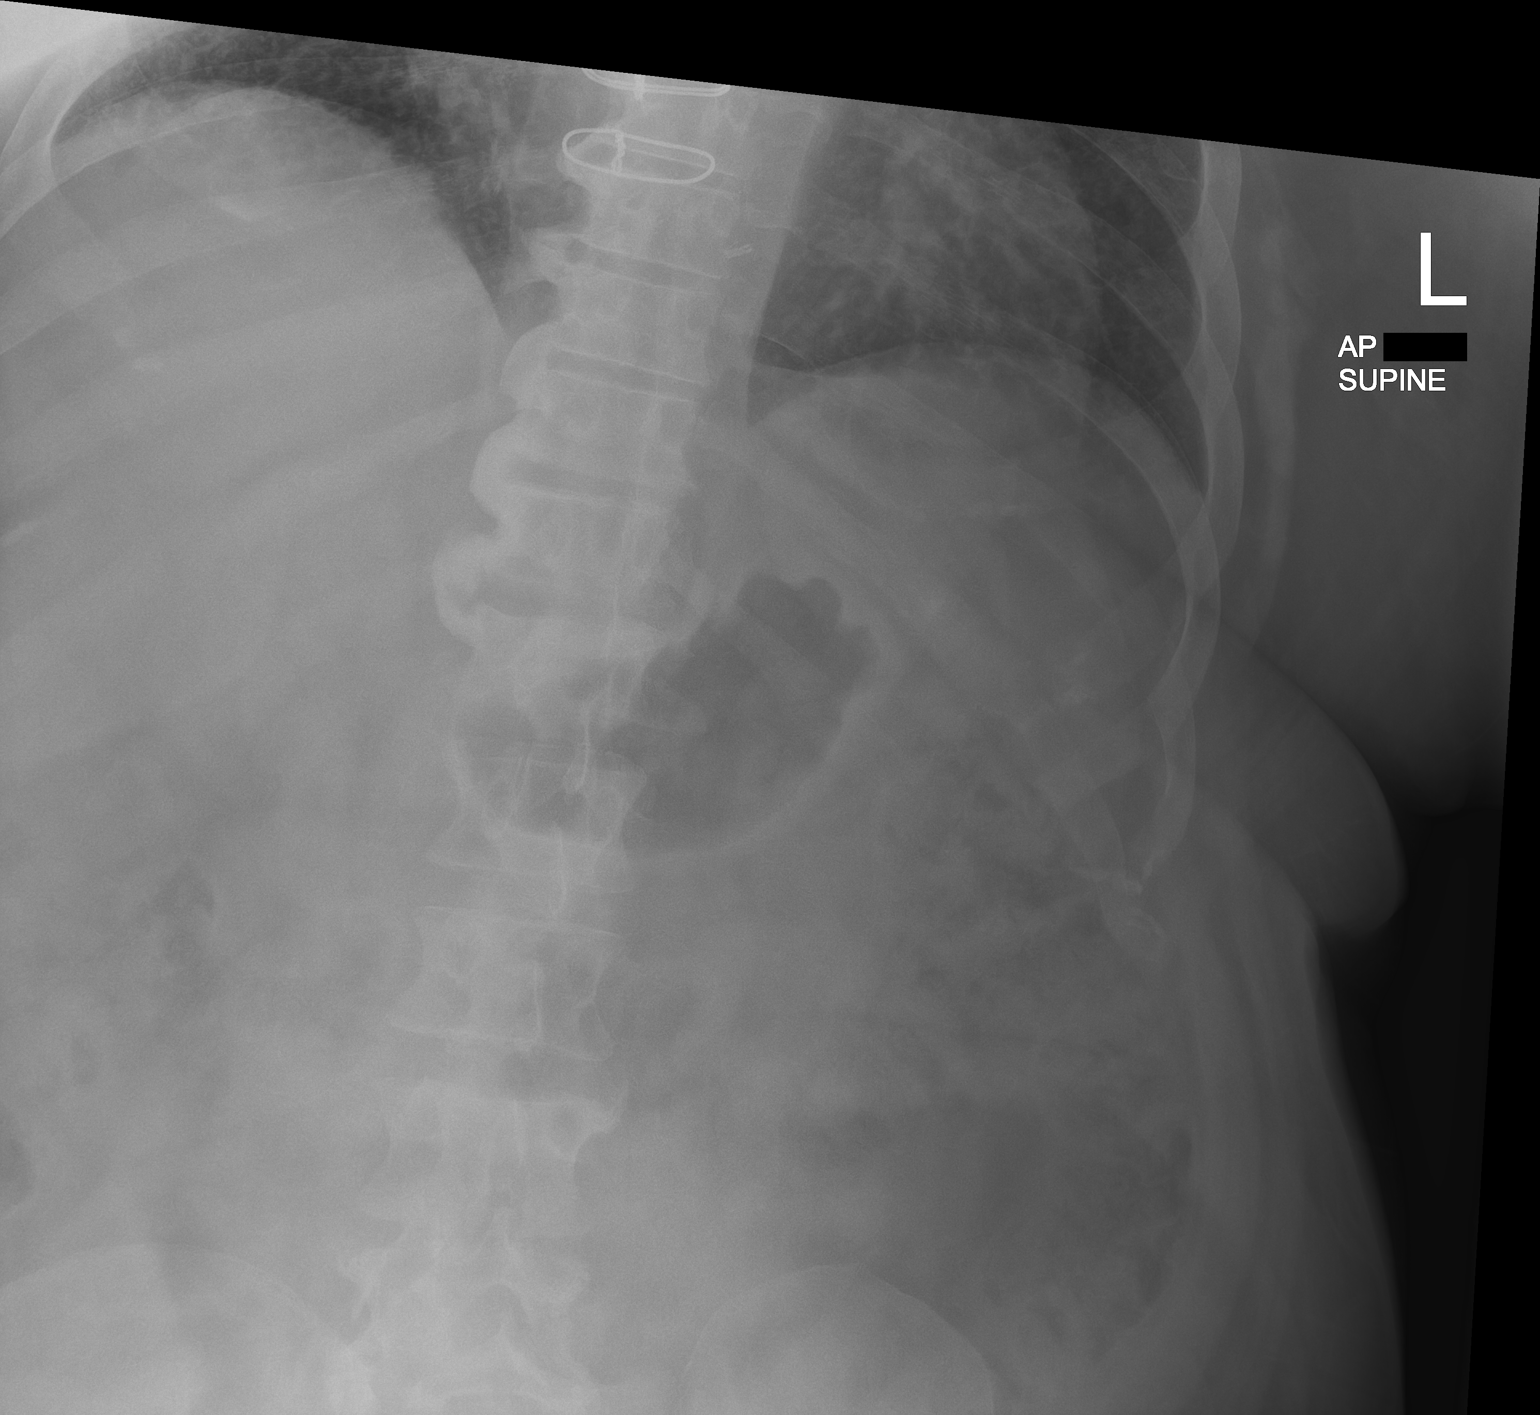

[abdomen kub (2 of 2)]
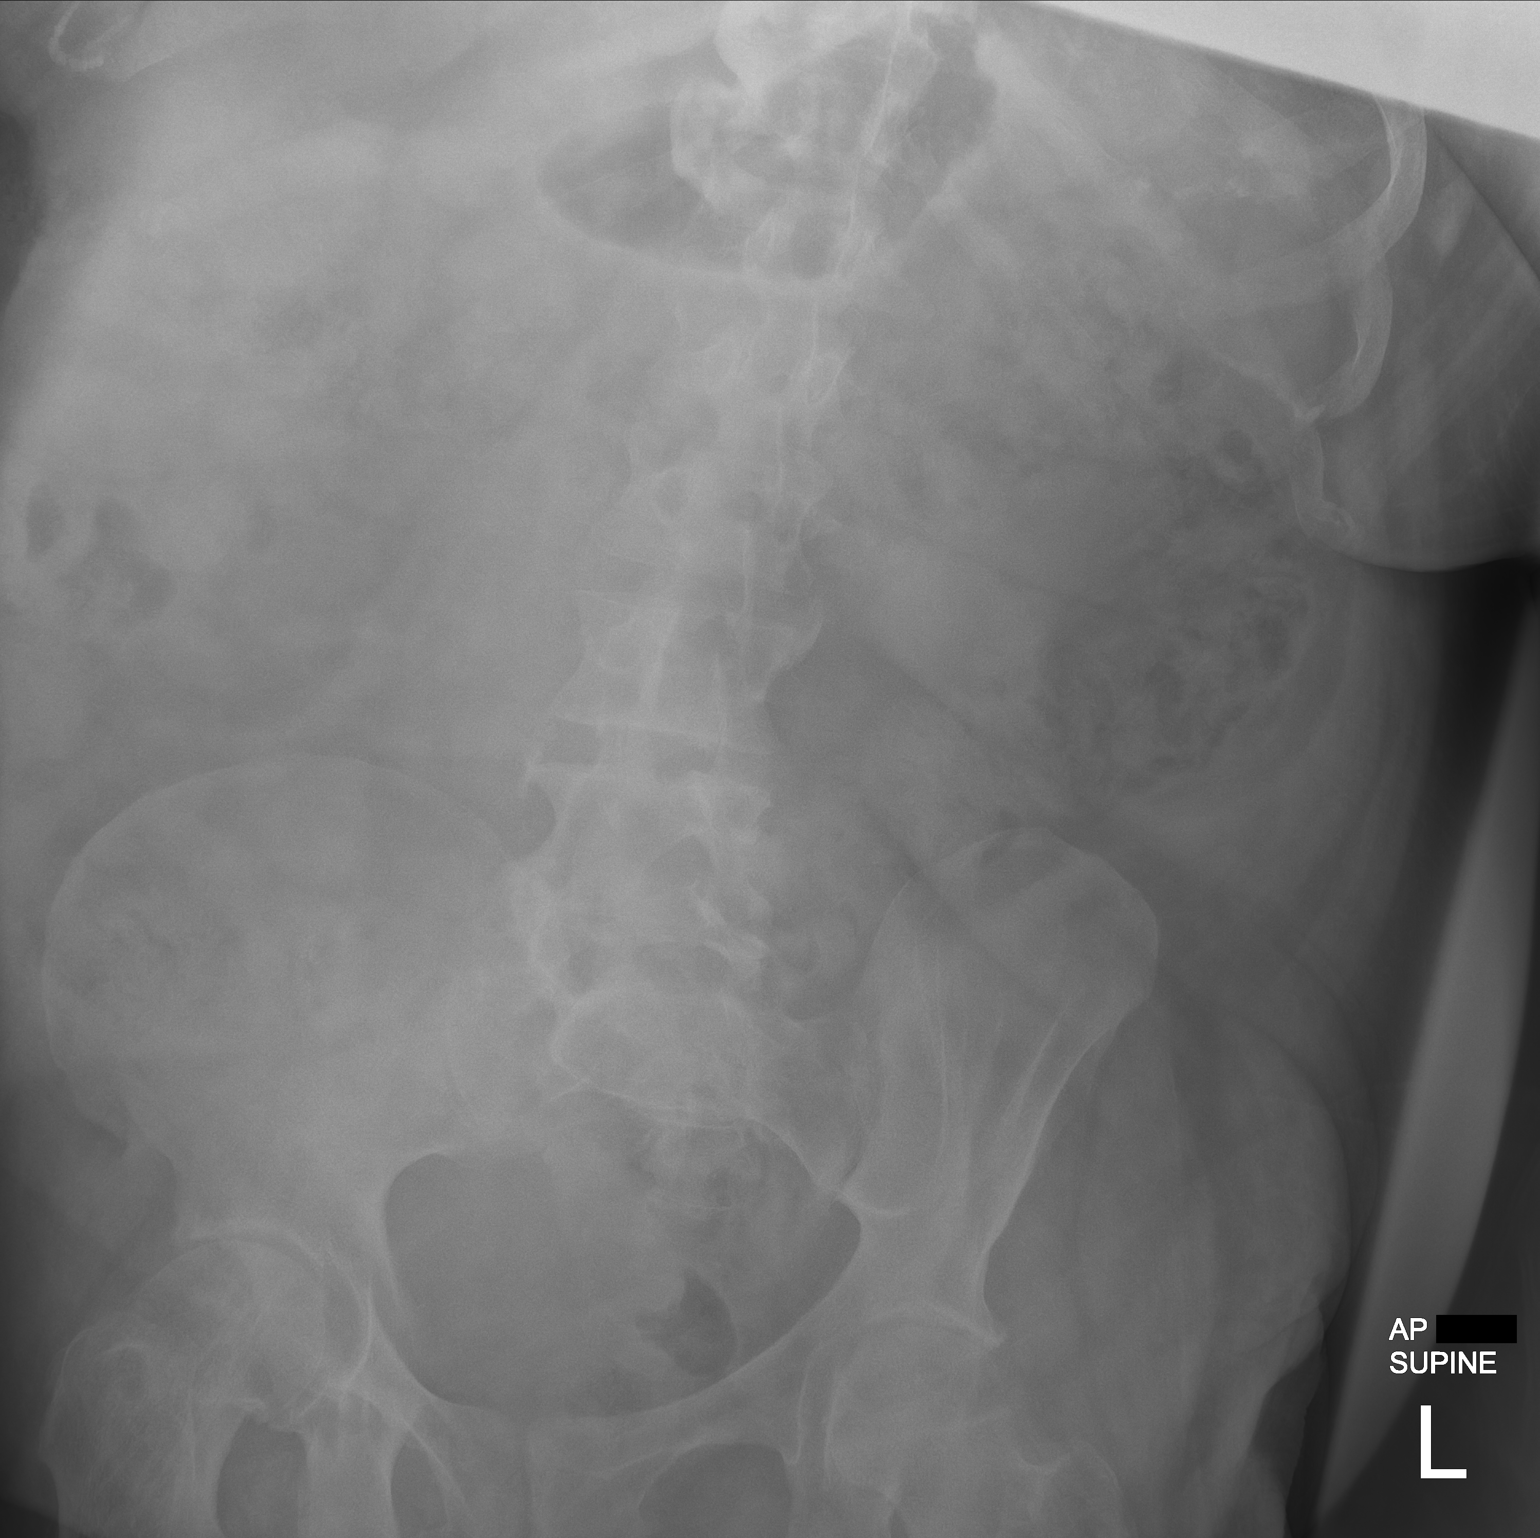

[2 of 2 positions shown; findings below may reference images not displayed]

FINDINGS: The bowel gas pattern is normal. No radio-opaque calculi or other
significant radiographic abnormality are seen.
IMPRESSION: Negative.

## 2022-06-04 ENCOUNTER — Encounter: Payer: Self-pay | Admitting: Skilled Nursing Facility1

## 2022-06-04 ENCOUNTER — Encounter: Payer: 59 | Attending: Internal Medicine | Admitting: Skilled Nursing Facility1

## 2022-06-04 DIAGNOSIS — E1169 Type 2 diabetes mellitus with other specified complication: Secondary | ICD-10-CM | POA: Diagnosis present

## 2022-06-04 DIAGNOSIS — N189 Chronic kidney disease, unspecified: Secondary | ICD-10-CM | POA: Insufficient documentation

## 2022-06-04 NOTE — Progress Notes (Signed)
Medical Nutrition Therapy    NUTRITION ASSESSMENT    Clinical Medical Hx: DM, CKD, BKA Medications: see list Labs: A1C 6.2, hyperkalemia hx, RBC 3.22, Hemoglobin 9.8, hematocrit 29.3, magnesium 1.2, phosphorus 5.8 Notable Signs/Symptoms:   Lifestyle & Dietary Hx  DM medications: Semaglitide insulin  A1C 6.2 down from 12-14.  Pronounced Mosh-A   Pt states he did get a new wheel chair he uses when he leaves the house and different one he uses around the house.  Pt states he has been seeing about 140-150 for blood sugars.  Pt states he did get his tooth pulled so is no longer experiencing mouth pain.  Pt sates he can tell his throat is swollen so will be going to the doctor for that.  Pt states he know she has not supposed to eat red but he made spagetti. Pt states he does choose whole wheat pasta.  Pt states he was drinking 3 Gatorade a day.  Pt states he starts going to the gym next week.   Pt states sometimes he will eat an entire rotisserie chicken in one sitting: Dietitian advised pt behaviors like that will have an impact on his kidney health so it is extremely important to not skip eating all day and then over consume in one sitting especially with his phosphorus already being high.   Estimated daily fluid intake: unknown oz Supplements:  Sleep: pt states he is having trouble sleeping stating he just got a sleep study done  Stress / self-care: limited self care Current average weekly physical activity: ADL's  24 hr recall: Breakfast 8am: sausage biscuit or sausage and 2 packets oatmeal Snack: Lunch:  Snack: Dinner 8pm: red sauce and pasta + hamburger and sausage  Snack:  Beverages: water  NUTRITION INTERVENTION  Nutrition education (E-1) on the following topics: continued Blood sugar control and avoidance of sugary drinks Hyperkalemia  Acidosis and fruits/vegetables  Hyperphosphatemia   Handouts Previously Provided Include  Meal ideas sheet  Learning  Style & Readiness for Change Teaching method utilized: Visual & Auditory  Demonstrated degree of understanding via: Teach Back  Barriers to learning/adherence to lifestyle change: difficulty making dietary changes  Goals Established by Pt Make 3 meals a day Drink 64 ounces of water per day NEW: eat lunch every day NEW: do not skip meals and do not over consume in one sitting    MONITORING & EVALUATION Dietary intake, weekly physical activity

## 2022-07-02 ENCOUNTER — Other Ambulatory Visit: Payer: Self-pay

## 2022-07-02 ENCOUNTER — Encounter (HOSPITAL_COMMUNITY): Payer: Self-pay

## 2022-07-02 ENCOUNTER — Emergency Department (HOSPITAL_COMMUNITY)
Admission: EM | Admit: 2022-07-02 | Discharge: 2022-07-02 | Disposition: A | Discharge status: Home / Self Care | Payer: Medicaid Other | Attending: Emergency Medicine | Admitting: Emergency Medicine

## 2022-07-02 DIAGNOSIS — E875 Hyperkalemia: Secondary | ICD-10-CM | POA: Insufficient documentation

## 2022-07-02 DIAGNOSIS — I132 Hypertensive heart and chronic kidney disease with heart failure and with stage 5 chronic kidney disease, or end stage renal disease: Secondary | ICD-10-CM | POA: Insufficient documentation

## 2022-07-02 DIAGNOSIS — R7989 Other specified abnormal findings of blood chemistry: Secondary | ICD-10-CM | POA: Diagnosis present

## 2022-07-02 DIAGNOSIS — Z951 Presence of aortocoronary bypass graft: Secondary | ICD-10-CM | POA: Insufficient documentation

## 2022-07-02 DIAGNOSIS — Z87891 Personal history of nicotine dependence: Secondary | ICD-10-CM | POA: Insufficient documentation

## 2022-07-02 DIAGNOSIS — N189 Chronic kidney disease, unspecified: Secondary | ICD-10-CM

## 2022-07-02 DIAGNOSIS — Z7982 Long term (current) use of aspirin: Secondary | ICD-10-CM | POA: Insufficient documentation

## 2022-07-02 DIAGNOSIS — E1122 Type 2 diabetes mellitus with diabetic chronic kidney disease: Secondary | ICD-10-CM | POA: Diagnosis not present

## 2022-07-02 DIAGNOSIS — Z794 Long term (current) use of insulin: Secondary | ICD-10-CM | POA: Diagnosis not present

## 2022-07-02 DIAGNOSIS — N186 End stage renal disease: Secondary | ICD-10-CM | POA: Diagnosis not present

## 2022-07-02 DIAGNOSIS — Z79899 Other long term (current) drug therapy: Secondary | ICD-10-CM | POA: Insufficient documentation

## 2022-07-02 DIAGNOSIS — I2510 Atherosclerotic heart disease of native coronary artery without angina pectoris: Secondary | ICD-10-CM | POA: Diagnosis not present

## 2022-07-02 DIAGNOSIS — Z992 Dependence on renal dialysis: Secondary | ICD-10-CM | POA: Insufficient documentation

## 2022-07-02 DIAGNOSIS — I509 Heart failure, unspecified: Secondary | ICD-10-CM | POA: Insufficient documentation

## 2022-07-02 LAB — I-STAT CHEM 8, ED
BUN: 82 mg/dL — ABNORMAL HIGH (ref 6–20)
Calcium, Ion: 1.07 mmol/L — ABNORMAL LOW (ref 1.15–1.40)
Chloride: 119 mmol/L — ABNORMAL HIGH (ref 98–111)
Creatinine, Ser: 7.1 mg/dL — ABNORMAL HIGH (ref 0.61–1.24)
Glucose, Bld: 203 mg/dL — ABNORMAL HIGH (ref 70–99)
HCT: 28 % — ABNORMAL LOW (ref 39.0–52.0)
Hemoglobin: 9.5 g/dL — ABNORMAL LOW (ref 13.0–17.0)
Potassium: 6.5 mmol/L (ref 3.5–5.1)
Sodium: 143 mmol/L (ref 135–145)
TCO2: 15 mmol/L — ABNORMAL LOW (ref 22–32)

## 2022-07-02 LAB — URINALYSIS, ROUTINE W REFLEX MICROSCOPIC
Bacteria, UA: NONE SEEN
Bilirubin Urine: NEGATIVE
Glucose, UA: 50 mg/dL — AB
Ketones, ur: NEGATIVE mg/dL
Nitrite: NEGATIVE
Protein, ur: 100 mg/dL — AB
Specific Gravity, Urine: 1.009 (ref 1.005–1.030)
pH: 5 (ref 5.0–8.0)

## 2022-07-02 LAB — BASIC METABOLIC PANEL
Anion gap: 8 (ref 5–15)
BUN: 72 mg/dL — ABNORMAL HIGH (ref 6–20)
CO2: 14 mmol/L — ABNORMAL LOW (ref 22–32)
Calcium: 7.2 mg/dL — ABNORMAL LOW (ref 8.9–10.3)
Chloride: 118 mmol/L — ABNORMAL HIGH (ref 98–111)
Creatinine, Ser: 6.04 mg/dL — ABNORMAL HIGH (ref 0.61–1.24)
GFR, Estimated: 11 mL/min — ABNORMAL LOW (ref >60)
Glucose, Bld: 174 mg/dL — ABNORMAL HIGH (ref 70–99)
Potassium: 5.5 mmol/L — ABNORMAL HIGH (ref 3.5–5.1)
Sodium: 140 mmol/L (ref 135–145)

## 2022-07-02 LAB — CBC WITH DIFFERENTIAL/PLATELET
Abs Immature Granulocytes: 0.07 10*3/uL (ref 0.00–0.07)
Basophils Absolute: 0 10*3/uL (ref 0.0–0.1)
Basophils Relative: 0 %
Eosinophils Absolute: 0.3 10*3/uL (ref 0.0–0.5)
Eosinophils Relative: 4 %
HCT: 31.2 % — ABNORMAL LOW (ref 39.0–52.0)
Hemoglobin: 9.6 g/dL — ABNORMAL LOW (ref 13.0–17.0)
Immature Granulocytes: 1 %
Lymphocytes Relative: 19 %
Lymphs Abs: 1.8 10*3/uL (ref 0.7–4.0)
MCH: 30.1 pg (ref 26.0–34.0)
MCHC: 30.8 g/dL (ref 30.0–36.0)
MCV: 97.8 fL (ref 80.0–100.0)
Monocytes Absolute: 0.5 10*3/uL (ref 0.1–1.0)
Monocytes Relative: 6 %
Neutro Abs: 6.8 10*3/uL (ref 1.7–7.7)
Neutrophils Relative %: 70 %
Platelets: 189 10*3/uL (ref 150–400)
RBC: 3.19 MIL/uL — ABNORMAL LOW (ref 4.22–5.81)
RDW: 12.8 % (ref 11.5–15.5)
WBC: 9.5 10*3/uL (ref 4.0–10.5)
nRBC: 0 % (ref 0.0–0.2)

## 2022-07-02 LAB — COMPREHENSIVE METABOLIC PANEL
ALT: 15 U/L (ref 0–44)
AST: 11 U/L — ABNORMAL LOW (ref 15–41)
Albumin: 3.1 g/dL — ABNORMAL LOW (ref 3.5–5.0)
Alkaline Phosphatase: 88 U/L (ref 38–126)
Anion gap: 7 (ref 5–15)
BUN: 74 mg/dL — ABNORMAL HIGH (ref 6–20)
CO2: 14 mmol/L — ABNORMAL LOW (ref 22–32)
Calcium: 7.7 mg/dL — ABNORMAL LOW (ref 8.9–10.3)
Chloride: 118 mmol/L — ABNORMAL HIGH (ref 98–111)
Creatinine, Ser: 6.37 mg/dL — ABNORMAL HIGH (ref 0.61–1.24)
GFR, Estimated: 10 mL/min — ABNORMAL LOW (ref >60)
Glucose, Bld: 202 mg/dL — ABNORMAL HIGH (ref 70–99)
Potassium: 6.5 mmol/L (ref 3.5–5.1)
Sodium: 139 mmol/L (ref 135–145)
Total Bilirubin: 0.7 mg/dL (ref 0.3–1.2)
Total Protein: 7.3 g/dL (ref 6.5–8.1)

## 2022-07-02 MED ORDER — FUROSEMIDE 10 MG/ML IJ SOLN
40.0000 mg | Freq: Once | INTRAMUSCULAR | Status: AC
  Administered 2022-07-02: 40 mg via INTRAVENOUS
  Filled 2022-07-02: qty 4

## 2022-07-02 MED ORDER — ALBUTEROL SULFATE (2.5 MG/3ML) 0.083% IN NEBU
10.0000 mg | INHALATION_SOLUTION | Freq: Once | RESPIRATORY_TRACT | Status: AC
  Administered 2022-07-02: 10 mg via RESPIRATORY_TRACT
  Filled 2022-07-02: qty 12

## 2022-07-02 MED ORDER — LOKELMA 10 G PO PACK: 10.0000 g | PACK | Freq: Two times a day (BID) | ORAL | 0 refills | Status: AC

## 2022-07-02 MED ORDER — FUROSEMIDE 20 MG PO TABS: 20.0000 mg | ORAL_TABLET | Freq: Every day | ORAL | 0 refills | Status: DC

## 2022-07-02 MED ORDER — SODIUM ZIRCONIUM CYCLOSILICATE 10 G PO PACK
10.0000 g | PACK | Freq: Once | ORAL | Status: AC
  Administered 2022-07-02: 10 g via ORAL
  Filled 2022-07-02: qty 1

## 2022-07-02 MED ORDER — SODIUM CHLORIDE 0.9 % IV BOLUS
1000.0000 mL | Freq: Once | INTRAVENOUS | Status: AC
  Administered 2022-07-02: 1000 mL via INTRAVENOUS

## 2022-07-02 MED ORDER — HYDRALAZINE HCL 25 MG PO TABS
100.0000 mg | ORAL_TABLET | Freq: Once | ORAL | Status: AC
  Administered 2022-07-02: 100 mg via ORAL
  Filled 2022-07-02: qty 4

## 2022-07-02 NOTE — ED Provider Triage Note (Signed)
Emergency Medicine Provider Triage Evaluation Note  Howard Mcmahon , a 51 y.o. male  was evaluated in triage.  Pt complains of abnormal labs.  Was at [facility] for preoperative routine blood work for consideration of dialysis and fistula placement.  Was called today and informed that he had a "high potassium".  Patient states he is unsure what the value was.  Was recommended to come to the hospital for further evaluation and treatment.  No other complaints at this time.  Hx of CAD, CKD stage IV, insulin-dependent DMT2, HTN, CABG, former tobacco use, and CHF.  Hx of left BKA.  On 40 mg Lasix daily.  Review of Systems  Positive: Negative: See above  Physical Exam  BP (!) 174/85 (BP Location: Left Arm)   Pulse 81   Temp 99.1 F (37.3 C) (Oral)   Resp 16   Ht 6\' 4"  (1.93 m)   Wt 117.9 kg   SpO2 99%   BMI 31.65 kg/m  Gen:   Awake, no distress   Resp:  Normal effort, CTAB, equal chest rise MSK:   Moves extremities without difficulty  Other:  Abdomen protuberant, soft, non-TTP.  Prior BKA left leg.  Medical Decision Making  Medically screening exam initiated at 3:06 PM.  Appropriate orders placed.  Rogen Bar was informed that the remainder of the evaluation will be completed by another provider, this initial triage assessment does not replace that evaluation, and the importance of remaining in the ED until their evaluation is complete.     Prince Rome, PA-C 15/40/08 [id number]

## 2022-07-02 NOTE — ED Notes (Signed)
I made MD Belfi aware of the critical I Stat Chem 8

## 2022-07-02 NOTE — ED Triage Notes (Signed)
Patient states that he was doing pre-op at [facility] today and the patient was told he had an extremely high K+ level.  Patient states he is supposed to get his dialysis cath in 3 days.  Patient denies any CP

## 2022-07-02 NOTE — Discharge Instructions (Signed)
You are being treated in the emergency department today for worsening kidney disease, and also for high potassium level.  Potassium was 6.8 earlier today.  After treatment in the ER your potassium came down to 5.5.  This is still high.  We recommended that you increase your Lokelma dose to 10 mg twice a day.  I also restarted you on Lasix, which is a water pill, which you will take in the morning.  This will help you pee out some potassium.  We talked about drinking more water in the daytime.  Most importantly, you need to call your nephrologist office tomorrow at [facility] to schedule a follow-up appointment.  Your kidney doctor needs to know about your kidney function and your rising potassium.  It is very important that you have your blood work rechecked as soon as possible this week, preferably within the next 3 days.  Someone should recheck your potassium and kidney function.  This can be either the kidney doctor or your primary care doctor.

## 2022-07-02 NOTE — ED Provider Notes (Signed)
[place] DEPT Provider Note   CSN: [id number] Arrival date & time: 07/02/22  1413     History  Chief Complaint  Patient presents with   Abnormal Lab    Howard Mcmahon is a 51 y.o. male with a history of chronic kidney disease, pending initiation of dialysis and fistula placement at [facility], history of type 2 diabetes, hypertension, presenting to emergency department with complaint of high potassium level.  The patient reports that routine blood work performed at [facility] and was contacted today his potassium is extremely high and needs to come to the hospital.    From his external records the patient had a potassium of 6.8 today, with BUN 69 and Cr 6.30 (from Cr 5.77 on 9/22).  His medical history per his records including history of CAD, CABG, former smoking, and heart failure.  He has had difficulty with hyperkalemia and metabolic acidosis for some time per his nephrology note from 06/01/22.  He has a history of a left BKA.  He is on Lasix 40 mg daily.  He is also on insulin for diabetes.   HPI     Home Medications Prior to Admission medications   Medication Sig Start Date End Date Taking? Authorizing Provider  furosemide (LASIX) 20 MG tablet Take 1 tablet (20 mg total) by mouth daily for 14 doses. 07/03/22 07/17/22 Yes Meganne Rita, Carola Rhine, MD  sodium zirconium cyclosilicate (LOKELMA) 10 g PACK packet Take 10 g by mouth 2 (two) times daily. 07/02/22 08/01/22 Yes Adrick Kestler, Carola Rhine, MD  acetaminophen (TYLENOL) 325 MG tablet Take 2 tablets (650 mg total) by mouth every 6 (six) hours as needed. Patient taking differently: Take 650 mg by mouth every 6 (six) hours as needed for mild pain. 08/14/21   Hendricks Limes, PA-C  aspirin 81 MG EC tablet Take 1 tablet (81 mg total) by mouth at bedtime. Swallow whole. 01/20/22   Florencia Reasons, MD  atorvastatin (LIPITOR) 80 MG tablet Take 80 mg by mouth daily. 10/03/21   [provider]  Blood Glucose  Monitoring Suppl (TRUE METRIX METER) w/Device KIT Use as directed Patient taking differently: 1 each by Other route as directed. 01/02/19   Kerin Perna, NP  carvedilol (COREG) 25 MG tablet Take 25 mg by mouth 2 (two) times daily. 10/03/21   [provider]  glucose blood (TRUE METRIX BLOOD GLUCOSE TEST) test strip Check blood sugar fasting and before meals and again if pt feels bad (symptoms of hypo). Check sugar three times a day 10/10/19   Charlann Lange, PA-C  hydrALAZINE (APRESOLINE) 100 MG tablet Take 1 tablet (100 mg total) by mouth 3 (three) times daily. 01/20/22   Florencia Reasons, MD  Insulin Isophane & Regular Human (NOVOLIN 70/30 FLEXPEN RELION) (70-30) 100 UNIT/ML PEN Inject 18 Units into the skin 2 (two) times daily. Patient taking differently: Inject 36 Units into the skin 2 (two) times daily. 10/20/19   Bonnielee Haff, MD  Insulin Pen Needle (NOVOFINE) 30G X 8 MM MISC Inject 10 each into the skin as needed. Patient taking differently: Inject 1 packet into the skin as needed (insulin). 10/20/19   Bonnielee Haff, MD  isosorbide mononitrate (IMDUR) 60 MG 24 hr tablet Take 1 tablet (60 mg total) by mouth daily. 01/21/22   Florencia Reasons, MD  nitroGLYCERIN (NITROSTAT) 0.4 MG SL tablet Place 0.4 mg under the tongue every 5 (five) minutes as needed for chest pain. 08/10/21   [provider]  pantoprazole (  PROTONIX) 40 MG tablet Take 1 tablet (40 mg total) by mouth daily. 01/20/22   Florencia Reasons, MD  Semaglutide,0.25 or 0.5MG /DOS, 2 MG/1.5ML SOPN Inject 0.25 mg into the skin once a week. Please follow up with your pcp regarding this meds 01/20/22   Florencia Reasons, MD  VENTOLIN HFA 108 [phone] Base) MCG/ACT inhaler Inhale 1-2 puffs into the lungs daily as needed for wheezing or shortness of breath. 03/02/21   [provider]      Allergies    Lisinopril, Lactose intolerance (gi), and Other    Review of Systems   Review of Systems  Physical Exam Updated Vital Signs BP (!) 174/68   Pulse 82    Temp 98.4 F (36.9 C) (Oral)   Resp 18   Ht 6\' 4"  (1.93 m)   Wt 117.9 kg   SpO2 100%   BMI 31.65 kg/m  Physical Exam Constitutional:      General: He is not in acute distress. HENT:     Head: Normocephalic and atraumatic.  Eyes:     Conjunctiva/sclera: Conjunctivae normal.     Pupils: Pupils are equal, round, and reactive to light.  Cardiovascular:     Rate and Rhythm: Normal rate and regular rhythm.  Pulmonary:     Effort: Pulmonary effort is normal. No respiratory distress.  Abdominal:     General: There is no distension.     Tenderness: There is no abdominal tenderness.  Skin:    General: Skin is warm and dry.     Comments: Left BKA  Neurological:     General: No focal deficit present.     Mental Status: He is alert. Mental status is at baseline.  Psychiatric:        Mood and Affect: Mood normal.        Behavior: Behavior normal.     ED Results / Procedures / Treatments   Labs (all labs ordered are listed, but only abnormal results are displayed) Labs Reviewed  COMPREHENSIVE METABOLIC PANEL - Abnormal; Notable for the following components:      Result Value   Potassium 6.5 (*)    Chloride 118 (*)    CO2 14 (*)    Glucose, Bld 202 (*)    BUN 74 (*)    Creatinine, Ser 6.37 (*)    Calcium 7.7 (*)    Albumin 3.1 (*)    AST 11 (*)    GFR, Estimated 10 (*)    All other components within normal limits  CBC WITH DIFFERENTIAL/PLATELET - Abnormal; Notable for the following components:   RBC 3.19 (*)    Hemoglobin 9.6 (*)    HCT 31.2 (*)    All other components within normal limits  URINALYSIS, ROUTINE W REFLEX MICROSCOPIC - Abnormal; Notable for the following components:   Color, Urine STRAW (*)    Glucose, UA 50 (*)    Hgb urine dipstick SMALL (*)    Protein, ur 100 (*)    Leukocytes,Ua TRACE (*)    All other components within normal limits  BASIC METABOLIC PANEL - Abnormal; Notable for the following components:   Potassium 5.5 (*)    Chloride 118 (*)     CO2 14 (*)    Glucose, Bld 174 (*)    BUN 72 (*)    Creatinine, Ser 6.04 (*)    Calcium 7.2 (*)    GFR, Estimated 11 (*)    All other components within normal limits  I-STAT CHEM 8, ED -  Abnormal; Notable for the following components:   Potassium 6.5 (*)    Chloride 119 (*)    BUN 82 (*)    Creatinine, Ser 7.10 (*)    Glucose, Bld 203 (*)    Calcium, Ion 1.07 (*)    TCO2 15 (*)    Hemoglobin 9.5 (*)    HCT 28.0 (*)    All other components within normal limits    EKG EKG Interpretation  Date/Time:  Monday July 02 2022 14:31:22 EDT Ventricular Rate:  78 PR Interval:  163 QRS Duration: 90 QT Interval:  380 QTC Calculation: 433 R Axis:   10 Text Interpretation: Sinus rhythm Low voltage, precordial leads Confirmed by Octaviano Glow [phone]) on 07/02/2022 3:11:52 PM  Radiology No results found.  Procedures .Critical Care  Performed by: Wyvonnia Dusky, MD Authorized by: Wyvonnia Dusky, MD   Critical care provider statement:    Critical care time (minutes):  45   Critical care time was exclusive of:  Separately billable procedures and treating other patients   Critical care was necessary to treat or prevent imminent or life-threatening deterioration of the following conditions:  Metabolic crisis   Critical care was time spent personally by me on the following activities:  Ordering and performing treatments and interventions, ordering and review of laboratory studies, ordering and review of radiographic studies, pulse oximetry, review of old charts, examination of patient and evaluation of patient's response to treatment Comments:     Treatment for hyperkalemia     Medications Ordered in ED Medications  furosemide (LASIX) injection 40 mg (40 mg Intravenous Given 07/02/22 1624)  sodium zirconium cyclosilicate (LOKELMA) packet 10 g (10 g Oral Given 07/02/22 1625)  albuterol (PROVENTIL) (2.5 MG/3ML) 0.083% nebulizer solution 10 mg (10 mg Nebulization Given 07/02/22  1625)  sodium chloride 0.9 % bolus 1,000 mL (0 mLs Intravenous Stopped 07/02/22 2143)  hydrALAZINE (APRESOLINE) tablet 100 mg (100 mg Oral Given 07/02/22 2155)    ED Course/ Medical Decision Making/ A&P Clinical Course as of 07/02/22 2242  Mon Jul 02, 2022  1923 Patient remains asymptomatic and telemetry is stable.  Plan to complete a liter of fluid and recheck BMP  [MT]  2129 Fluids are finished, nurse to draw repeat labs [MT]  2238 I spoke to Dr Marval Regal from nephrology who reviewed the patient's work-up, and said if there is no concern for uremia or volume overload, and there is his level of improvement of potassium, the patient could be discharged with improved Lokelma dosing to 10 mg twice daily, also restarted on Lasix in the morning, for several days until he sees the nephrologist.  This was discussed with the patient who is in agreement.  I emphasized the need for nephrology follow-up at his own facility at [facility].  He verbalized understanding.  He has a doctor's appointment coming up this week as well where he can ensure her blood work is redone. [MT]    Clinical Course User Index [MT] Shantae Vantol, Carola Rhine, MD                           Medical Decision Making Amount and/or Complexity of Data Reviewed Labs: ordered.  Risk Prescription drug management.   This patient presents to the ED with concern for asymptomatic hyperkalemia.. This involves an extensive number of treatment options, and is a complaint that carries with it a high risk of complications and morbidity.    Hyperkalemia is most  likely related to chronic kidney disease.  The patient also reports to me that he does not drink much water at home, this may be contributing to his worsening kidney disease.  Co-morbidities that complicate the patient evaluation: Chronic kidney disease, diabetes, high risk for kidney disease complications such as electrolyte derangement  External records from outside source obtained and  reviewed including outside hospital system lab work from [facility], as well as nephrology note from September at [facility], due to prolonged waiting times, the patient did have a prolonged waiting period prior to being seen by myself today in the emergency department.  He did receive treatment for hyperkalemia in a probably manner after triage.  I personally reviewed his EKG at that time which did not show any evidence of acute hyperkalemic changes.  I ordered and personally interpreted labs.  The pertinent results include: Initial potassium 6.5 with BUN 74 and Cr 6.37.  Hgb 9.6.  The patient was maintained on a cardiac monitor.  I personally viewed and interpreted the cardiac monitored which showed an underlying rhythm of: NSR  Per my interpretation the patient's ECG shows sinus rhythm no acute ischemic findings  I ordered medication including for hyperkalemia  I have reviewed the patients home medicines and have made adjustments as needed  I requested consultation with the nephrology,  and discussed lab and imaging findings as well as pertinent plan - they recommend: see ed course  After the interventions noted above, I reevaluated the patient and found that they have: improved  Dispostion:  After consideration of the diagnostic results and the patients response to treatment, I feel that the patent would benefit from close outpatient follow-up with both PCP and nephrologist at [facility].         Final Clinical Impression(s) / ED Diagnoses Final diagnoses:  Hyperkalemia  Chronic kidney disease, unspecified CKD stage    Rx / DC Orders ED Discharge Orders          Ordered    furosemide (LASIX) 20 MG tablet  Daily        07/02/22 2241    sodium zirconium cyclosilicate (LOKELMA) 10 g PACK packet  2 times daily        07/02/22 2241              Wyvonnia Dusky, MD 07/02/22 2242

## 2022-11-10 ENCOUNTER — Emergency Department (HOSPITAL_BASED_OUTPATIENT_CLINIC_OR_DEPARTMENT_OTHER): Payer: Medicare Other

## 2022-11-10 ENCOUNTER — Other Ambulatory Visit: Payer: Self-pay

## 2022-11-10 ENCOUNTER — Emergency Department (HOSPITAL_BASED_OUTPATIENT_CLINIC_OR_DEPARTMENT_OTHER)
Admission: EM | Admit: 2022-11-10 | Discharge: 2022-11-10 | Disposition: A | Discharge status: Home / Self Care | Payer: Medicare Other | Attending: Emergency Medicine | Admitting: Emergency Medicine

## 2022-11-10 ENCOUNTER — Emergency Department (HOSPITAL_BASED_OUTPATIENT_CLINIC_OR_DEPARTMENT_OTHER): Payer: Medicare Other | Admitting: Radiology

## 2022-11-10 ENCOUNTER — Encounter (HOSPITAL_BASED_OUTPATIENT_CLINIC_OR_DEPARTMENT_OTHER): Payer: Self-pay | Admitting: Emergency Medicine

## 2022-11-10 DIAGNOSIS — Z7982 Long term (current) use of aspirin: Secondary | ICD-10-CM | POA: Insufficient documentation

## 2022-11-10 DIAGNOSIS — W01198A Fall on same level from slipping, tripping and stumbling with subsequent striking against other object, initial encounter: Secondary | ICD-10-CM | POA: Insufficient documentation

## 2022-11-10 DIAGNOSIS — N186 End stage renal disease: Secondary | ICD-10-CM | POA: Insufficient documentation

## 2022-11-10 DIAGNOSIS — S299XXA Unspecified injury of thorax, initial encounter: Secondary | ICD-10-CM | POA: Diagnosis not present

## 2022-11-10 DIAGNOSIS — Z992 Dependence on renal dialysis: Secondary | ICD-10-CM | POA: Insufficient documentation

## 2022-11-10 DIAGNOSIS — R0781 Pleurodynia: Secondary | ICD-10-CM

## 2022-11-10 LAB — BASIC METABOLIC PANEL
Anion gap: 12 (ref 5–15)
BUN: 46 mg/dL — ABNORMAL HIGH (ref 6–20)
CO2: 21 mmol/L — ABNORMAL LOW (ref 22–32)
Calcium: 9 mg/dL (ref 8.9–10.3)
Chloride: 103 mmol/L (ref 98–111)
Creatinine, Ser: 5.44 mg/dL — ABNORMAL HIGH (ref 0.61–1.24)
GFR, Estimated: 12 mL/min — ABNORMAL LOW (ref >60)
Glucose, Bld: 357 mg/dL — ABNORMAL HIGH (ref 70–99)
Potassium: 4.9 mmol/L (ref 3.5–5.1)
Sodium: 136 mmol/L (ref 135–145)

## 2022-11-10 LAB — TROPONIN I (HIGH SENSITIVITY): Troponin I (High Sensitivity): 11 ng/L (ref <18)

## 2022-11-10 LAB — CBC WITH DIFFERENTIAL/PLATELET
Abs Immature Granulocytes: 0.05 10*3/uL (ref 0.00–0.07)
Basophils Absolute: 0 10*3/uL (ref 0.0–0.1)
Basophils Relative: 0 %
Eosinophils Absolute: 0.3 10*3/uL (ref 0.0–0.5)
Eosinophils Relative: 3 %
HCT: 32.4 % — ABNORMAL LOW (ref 39.0–52.0)
Hemoglobin: 10.4 g/dL — ABNORMAL LOW (ref 13.0–17.0)
Immature Granulocytes: 1 %
Lymphocytes Relative: 18 %
Lymphs Abs: 1.7 10*3/uL (ref 0.7–4.0)
MCH: 30.1 pg (ref 26.0–34.0)
MCHC: 32.1 g/dL (ref 30.0–36.0)
MCV: 93.6 fL (ref 80.0–100.0)
Monocytes Absolute: 0.7 10*3/uL (ref 0.1–1.0)
Monocytes Relative: 7 %
Neutro Abs: 6.9 10*3/uL (ref 1.7–7.7)
Neutrophils Relative %: 71 %
Platelets: 208 10*3/uL (ref 150–400)
RBC: 3.46 MIL/uL — ABNORMAL LOW (ref 4.22–5.81)
RDW: 12.6 % (ref 11.5–15.5)
WBC: 9.7 10*3/uL (ref 4.0–10.5)
nRBC: 0 % (ref 0.0–0.2)

## 2022-11-10 LAB — BRAIN NATRIURETIC PEPTIDE: B Natriuretic Peptide: 297.6 pg/mL — ABNORMAL HIGH (ref 0.0–100.0)

## 2022-11-10 MED ORDER — OXYCODONE-ACETAMINOPHEN 5-325 MG PO TABS: 1.0000 | ORAL_TABLET | Freq: Two times a day (BID) | ORAL | 0 refills | Status: DC | PRN

## 2022-11-10 MED ORDER — OXYCODONE-ACETAMINOPHEN 5-325 MG PO TABS
1.0000 | ORAL_TABLET | Freq: Once | ORAL | Status: AC
  Administered 2022-11-10: 1 via ORAL
  Filled 2022-11-10: qty 1

## 2022-11-10 NOTE — ED Provider Notes (Signed)
[place] Provider Note   CSN: [id number] Arrival date & time: 11/10/22  1325     History  Chief Complaint  Patient presents with   Rib Injury    Howard Mcmahon is a 52 y.o. male w/ ESRD on dialysis presenting with mechanical fall and left rib injury.  Not on A/C.  He makes urine.  HPI     Home Medications Prior to Admission medications   Medication Sig Start Date End Date Taking? Authorizing Provider  oxyCODONE-acetaminophen (PERCOCET/ROXICET) 5-325 MG tablet Take 1 tablet by mouth every 12 (twelve) hours as needed for up to 12 doses for severe pain. 11/10/22  Yes Azlee Monforte, Carola Rhine, MD  acetaminophen (TYLENOL) 325 MG tablet Take 2 tablets (650 mg total) by mouth every 6 (six) hours as needed. Patient taking differently: Take 650 mg by mouth every 6 (six) hours as needed for mild pain. 08/14/21   Hendricks Limes, PA-C  aspirin 81 MG EC tablet Take 1 tablet (81 mg total) by mouth at bedtime. Swallow whole. 01/20/22   Florencia Reasons, MD  atorvastatin (LIPITOR) 80 MG tablet Take 80 mg by mouth daily. 10/03/21   [provider]  Blood Glucose Monitoring Suppl (TRUE METRIX METER) w/Device KIT Use as directed Patient taking differently: 1 each by Other route as directed. 01/02/19   Kerin Perna, NP  carvedilol (COREG) 25 MG tablet Take 25 mg by mouth 2 (two) times daily. 10/03/21   [provider]  furosemide (LASIX) 20 MG tablet Take 1 tablet (20 mg total) by mouth daily for 14 doses. 07/03/22 07/17/22  Wyvonnia Dusky, MD  glucose blood (TRUE METRIX BLOOD GLUCOSE TEST) test strip Check blood sugar fasting and before meals and again if pt feels bad (symptoms of hypo). Check sugar three times a day 10/10/19   Charlann Lange, PA-C  hydrALAZINE (APRESOLINE) 100 MG tablet Take 1 tablet (100 mg total) by mouth 3 (three) times daily. 01/20/22   Florencia Reasons, MD  Insulin Isophane & Regular Human (NOVOLIN 70/30 FLEXPEN RELION) (70-30) 100  UNIT/ML PEN Inject 18 Units into the skin 2 (two) times daily. Patient taking differently: Inject 36 Units into the skin 2 (two) times daily. 10/20/19   Bonnielee Haff, MD  Insulin Pen Needle (NOVOFINE) 30G X 8 MM MISC Inject 10 each into the skin as needed. Patient taking differently: Inject 1 packet into the skin as needed (insulin). 10/20/19   Bonnielee Haff, MD  isosorbide mononitrate (IMDUR) 60 MG 24 hr tablet Take 1 tablet (60 mg total) by mouth daily. 01/21/22   Florencia Reasons, MD  nitroGLYCERIN (NITROSTAT) 0.4 MG SL tablet Place 0.4 mg under the tongue every 5 (five) minutes as needed for chest pain. 08/10/21   [provider]  pantoprazole (PROTONIX) 40 MG tablet Take 1 tablet (40 mg total) by mouth daily. 01/20/22   Florencia Reasons, MD  Semaglutide,0.25 or 0.'5MG'$ /DOS, 2 MG/1.5ML SOPN Inject 0.25 mg into the skin once a week. Please follow up with your pcp regarding this meds 01/20/22   Florencia Reasons, MD  VENTOLIN HFA 108 [phone] Base) MCG/ACT inhaler Inhale 1-2 puffs into the lungs daily as needed for wheezing or shortness of breath. 03/02/21   [provider]      Allergies    Lisinopril, Lactose intolerance (gi), and Other    Review of Systems   Review of Systems  Physical Exam Updated Vital Signs BP (!) 159/84   Pulse 77   Temp  98 F (36.7 C)   Resp 13   SpO2 95%  Physical Exam Constitutional:      General: He is not in acute distress. HENT:     Head: Normocephalic and atraumatic.  Eyes:     Conjunctiva/sclera: Conjunctivae normal.     Pupils: Pupils are equal, round, and reactive to light.  Cardiovascular:     Rate and Rhythm: Normal rate and regular rhythm.  Pulmonary:     Effort: Pulmonary effort is normal. No respiratory distress.  Abdominal:     General: There is no distension.     Tenderness: There is no abdominal tenderness.  Musculoskeletal:     Comments: Left anterior/lateral rib tenderness  Skin:    General: Skin is warm and dry.  Neurological:     General: No  focal deficit present.     Mental Status: He is alert. Mental status is at baseline.  Psychiatric:        Mood and Affect: Mood normal.        Behavior: Behavior normal.     ED Results / Procedures / Treatments   Labs (all labs ordered are listed, but only abnormal results are displayed) Labs Reviewed  CBC WITH DIFFERENTIAL/PLATELET - Abnormal; Notable for the following components:      Result Value   RBC 3.46 (*)    Hemoglobin 10.4 (*)    HCT 32.4 (*)    All other components within normal limits  BRAIN NATRIURETIC PEPTIDE - Abnormal; Notable for the following components:   B Natriuretic Peptide 297.6 (*)    All other components within normal limits  BASIC METABOLIC PANEL - Abnormal; Notable for the following components:   CO2 21 (*)    Glucose, Bld 357 (*)    BUN 46 (*)    Creatinine, Ser 5.44 (*)    GFR, Estimated 12 (*)    All other components within normal limits  TROPONIN I (HIGH SENSITIVITY)    EKG None  Radiology CT ABDOMEN PELVIS WO CONTRAST  Result Date: 11/10/2022 CLINICAL DATA:  Left lower quadrant abdominal pain. While trying to get into the car he slipped and hit his left rib on the car frame. EXAM: CT ABDOMEN AND PELVIS WITHOUT CONTRAST TECHNIQUE: Multidetector CT imaging of the abdomen and pelvis was performed following the standard protocol without IV contrast. RADIATION DOSE REDUCTION: This exam was performed according to the departmental dose-optimization program which includes automated exposure control, adjustment of the mA and/or kV according to patient size and/or use of iterative reconstruction technique. COMPARISON:  None Available. FINDINGS: Lower chest: Bibasilar interlobular septal thickening suggesting mild scarring. Lung bases are otherwise clear. Hepatobiliary: No focal liver abnormality is seen. Gallbladder sludge in the dependent gallbladder without gallbladder wall thickening, or biliary dilatation. Pancreas: Unremarkable. No pancreatic ductal  dilatation or surrounding inflammatory changes. Spleen: Normal in size without focal abnormality. Adrenals/Urinary Tract: Adrenal glands are unremarkable. Kidneys are normal, without renal calculi, focal lesion, or hydronephrosis. Bladder is unremarkable. Stomach/Bowel: Stomach is within normal limits. Appendix appears normal. No evidence of bowel wall thickening, distention, or inflammatory changes. Vascular/Lymphatic: Aortic atherosclerosis. No enlarged abdominal or pelvic lymph nodes. Reproductive: Prostate is unremarkable. Other: Fat containing umbilical hernia. There is also fat containing left inguinal hernia. Musculoskeletal: Mild multilevel degenerate disc disease of the lumbar spine. Schmorl node in the superior endplate of L1 vertebral body. IMPRESSION: 1. No CT evidence of acute intra-abdominal or pelvic process. 2. Fat containing umbilical hernia and fat containing left inguinal hernia. 3.  Gallbladder sludge without evidence of acute cholecystitis. 4. Aortic atherosclerosis. 5. Multilevel degenerate disc disease of the lumbar spine. Aortic Atherosclerosis (ICD10-I70.0). Electronically Signed   By: Keane Police D.O.   On: 11/10/2022 15:32   DG Chest 2 View  Result Date: 11/10/2022 CLINICAL DATA:  Fall. EXAM: CHEST - 2 VIEW COMPARISON:  Chest x-ray Jan 14, 2022 FINDINGS: A right-sided central line terminates near the caval atrial junction. No pneumothorax. The cardiomediastinal silhouette is stable. Possible pulmonary venous congestion/mild edema. No other abnormalities. IMPRESSION: Possible pulmonary venous congestion/mild edema. No other acute abnormalities. Electronically Signed   By: Dorise Bullion III M.D.   On: 11/10/2022 14:10    Procedures Procedures    Medications Ordered in ED Medications  oxyCODONE-acetaminophen (PERCOCET/ROXICET) 5-325 MG per tablet 1 tablet (1 tablet Oral Given 11/10/22 1538)    ED Course/ Medical Decision Making/ A&P                             Medical  Decision Making Amount and/or Complexity of Data Reviewed Labs: ordered. Radiology: ordered.  Risk Prescription drug management.   This patient presents to the ED with concern for left sided chest pain. This involves an extensive number of treatment options, and is a complaint that carries with it a high risk of complications and morbidity.  The differential diagnosis includes rib fx vs contusion vs PNA vs other  CT abdomen pelvis ordered by triage staff on conjunction with previous EDP, prior to my assessment out of reported concern for flank injury - I do not appreciate this on exam, not hematoma or abdominal guarding.  I ordered and personally interpreted labs.  The pertinent results include:  no emergent findings  I ordered imaging studies including dg chest., ct abdomen pelvis I independently visualized and interpreted imaging which showed no emergent findings I agree with the radiologist interpretation  The patient was maintained on a cardiac monitor.  I personally viewed and interpreted the cardiac monitored which showed an underlying rhythm of: Sinus rhythm and sinus tachycardia   I ordered medication including percocet for pain  I have reviewed the patients home medicines and have made adjustments as needed  Test Considered: doubt acute PE  After the interventions noted above, I reevaluated the patient and found that they have: improved   Dispostion:  After consideration of the diagnostic results and the patients response to treatment, I feel that the patent would benefit from outpatient pcp f/u.  Incentive spirometer training provider for suspect rib fx.         Final Clinical Impression(s) / ED Diagnoses Final diagnoses:  Rib pain    Rx / DC Orders ED Discharge Orders          Ordered    oxyCODONE-acetaminophen (PERCOCET/ROXICET) 5-325 MG tablet  Every 12 hours PRN        11/10/22 1602              Wyvonnia Dusky, MD 11/10/22 1811

## 2022-11-10 NOTE — Discharge Instructions (Addendum)
For the next 10 days you should use incentive spirometer taking 10 slow breaths at a time, every hour to while you are awake.  This to prevent pneumonia

## 2022-11-10 NOTE — ED Triage Notes (Signed)
Pt is amputee. While trying to get into car ,he slipped and hit his left ribs on the car frame.

## 2023-03-11 ENCOUNTER — Encounter (HOSPITAL_COMMUNITY): Payer: Self-pay | Admitting: Emergency Medicine

## 2023-03-11 ENCOUNTER — Other Ambulatory Visit: Payer: Self-pay

## 2023-03-11 ENCOUNTER — Inpatient Hospital Stay (HOSPITAL_COMMUNITY)
Admission: EM | Admit: 2023-03-11 | Discharge: 2023-03-14 | DRG: 871 | Discharge status: Against Medical Advice | Payer: 59 | Attending: Internal Medicine | Admitting: Internal Medicine

## 2023-03-11 ENCOUNTER — Emergency Department (HOSPITAL_COMMUNITY): Payer: 59

## 2023-03-11 DIAGNOSIS — Z96641 Presence of right artificial hip joint: Secondary | ICD-10-CM | POA: Diagnosis present

## 2023-03-11 DIAGNOSIS — B181 Chronic viral hepatitis B without delta-agent: Secondary | ICD-10-CM | POA: Diagnosis present

## 2023-03-11 DIAGNOSIS — E1165 Type 2 diabetes mellitus with hyperglycemia: Secondary | ICD-10-CM | POA: Diagnosis present

## 2023-03-11 DIAGNOSIS — Z1152 Encounter for screening for COVID-19: Secondary | ICD-10-CM | POA: Diagnosis not present

## 2023-03-11 DIAGNOSIS — I252 Old myocardial infarction: Secondary | ICD-10-CM

## 2023-03-11 DIAGNOSIS — M898X9 Other specified disorders of bone, unspecified site: Secondary | ICD-10-CM | POA: Diagnosis present

## 2023-03-11 DIAGNOSIS — Z794 Long term (current) use of insulin: Secondary | ICD-10-CM | POA: Diagnosis not present

## 2023-03-11 DIAGNOSIS — Z7982 Long term (current) use of aspirin: Secondary | ICD-10-CM

## 2023-03-11 DIAGNOSIS — I255 Ischemic cardiomyopathy: Secondary | ICD-10-CM | POA: Diagnosis present

## 2023-03-11 DIAGNOSIS — I132 Hypertensive heart and chronic kidney disease with heart failure and with stage 5 chronic kidney disease, or end stage renal disease: Secondary | ICD-10-CM | POA: Diagnosis present

## 2023-03-11 DIAGNOSIS — R051 Acute cough: Secondary | ICD-10-CM

## 2023-03-11 DIAGNOSIS — E1140 Type 2 diabetes mellitus with diabetic neuropathy, unspecified: Secondary | ICD-10-CM | POA: Diagnosis present

## 2023-03-11 DIAGNOSIS — Z89512 Acquired absence of left leg below knee: Secondary | ICD-10-CM

## 2023-03-11 DIAGNOSIS — L02512 Cutaneous abscess of left hand: Secondary | ICD-10-CM | POA: Diagnosis present

## 2023-03-11 DIAGNOSIS — Z6834 Body mass index (BMI) 34.0-34.9, adult: Secondary | ICD-10-CM

## 2023-03-11 DIAGNOSIS — L03012 Cellulitis of left finger: Secondary | ICD-10-CM | POA: Diagnosis present

## 2023-03-11 DIAGNOSIS — E1122 Type 2 diabetes mellitus with diabetic chronic kidney disease: Secondary | ICD-10-CM | POA: Diagnosis present

## 2023-03-11 DIAGNOSIS — Z951 Presence of aortocoronary bypass graft: Secondary | ICD-10-CM

## 2023-03-11 DIAGNOSIS — Z992 Dependence on renal dialysis: Secondary | ICD-10-CM

## 2023-03-11 DIAGNOSIS — K219 Gastro-esophageal reflux disease without esophagitis: Secondary | ICD-10-CM | POA: Diagnosis present

## 2023-03-11 DIAGNOSIS — E785 Hyperlipidemia, unspecified: Secondary | ICD-10-CM | POA: Diagnosis present

## 2023-03-11 DIAGNOSIS — A4102 Sepsis due to Methicillin resistant Staphylococcus aureus: Principal | ICD-10-CM | POA: Diagnosis present

## 2023-03-11 DIAGNOSIS — E1151 Type 2 diabetes mellitus with diabetic peripheral angiopathy without gangrene: Secondary | ICD-10-CM | POA: Diagnosis present

## 2023-03-11 DIAGNOSIS — Z79899 Other long term (current) drug therapy: Secondary | ICD-10-CM

## 2023-03-11 DIAGNOSIS — I2510 Atherosclerotic heart disease of native coronary artery without angina pectoris: Secondary | ICD-10-CM | POA: Diagnosis present

## 2023-03-11 DIAGNOSIS — F419 Anxiety disorder, unspecified: Secondary | ICD-10-CM | POA: Diagnosis present

## 2023-03-11 DIAGNOSIS — I5042 Chronic combined systolic (congestive) and diastolic (congestive) heart failure: Secondary | ICD-10-CM | POA: Diagnosis present

## 2023-03-11 DIAGNOSIS — Z823 Family history of stroke: Secondary | ICD-10-CM

## 2023-03-11 DIAGNOSIS — D638 Anemia in other chronic diseases classified elsewhere: Secondary | ICD-10-CM | POA: Diagnosis not present

## 2023-03-11 DIAGNOSIS — E669 Obesity, unspecified: Secondary | ICD-10-CM | POA: Diagnosis present

## 2023-03-11 DIAGNOSIS — F32A Depression, unspecified: Secondary | ICD-10-CM | POA: Diagnosis present

## 2023-03-11 DIAGNOSIS — N186 End stage renal disease: Secondary | ICD-10-CM | POA: Diagnosis present

## 2023-03-11 DIAGNOSIS — D631 Anemia in chronic kidney disease: Secondary | ICD-10-CM | POA: Diagnosis present

## 2023-03-11 DIAGNOSIS — E872 Acidosis, unspecified: Secondary | ICD-10-CM | POA: Diagnosis present

## 2023-03-11 DIAGNOSIS — Z91011 Allergy to milk products: Secondary | ICD-10-CM

## 2023-03-11 DIAGNOSIS — Z7985 Long-term (current) use of injectable non-insulin antidiabetic drugs: Secondary | ICD-10-CM

## 2023-03-11 DIAGNOSIS — Z5329 Procedure and treatment not carried out because of patient's decision for other reasons: Secondary | ICD-10-CM | POA: Diagnosis present

## 2023-03-11 DIAGNOSIS — Z955 Presence of coronary angioplasty implant and graft: Secondary | ICD-10-CM

## 2023-03-11 DIAGNOSIS — Z888 Allergy status to other drugs, medicaments and biological substances status: Secondary | ICD-10-CM

## 2023-03-11 DIAGNOSIS — A419 Sepsis, unspecified organism: Secondary | ICD-10-CM | POA: Diagnosis present

## 2023-03-11 LAB — CBC WITH DIFFERENTIAL/PLATELET
Abs Immature Granulocytes: 0.03 10*3/uL (ref 0.00–0.07)
Basophils Absolute: 0 10*3/uL (ref 0.0–0.1)
Basophils Relative: 0 %
Eosinophils Absolute: 0.3 10*3/uL (ref 0.0–0.5)
Eosinophils Relative: 3 %
HCT: 32.9 % — ABNORMAL LOW (ref 39.0–52.0)
Hemoglobin: 10.5 g/dL — ABNORMAL LOW (ref 13.0–17.0)
Immature Granulocytes: 0 %
Lymphocytes Relative: 13 %
Lymphs Abs: 0.9 10*3/uL (ref 0.7–4.0)
MCH: 32.1 pg (ref 26.0–34.0)
MCHC: 31.9 g/dL (ref 30.0–36.0)
MCV: 100.6 fL — ABNORMAL HIGH (ref 80.0–100.0)
Monocytes Absolute: 0.7 10*3/uL (ref 0.1–1.0)
Monocytes Relative: 9 %
Neutro Abs: 5.5 10*3/uL (ref 1.7–7.7)
Neutrophils Relative %: 75 %
Platelets: 181 10*3/uL (ref 150–400)
RBC: 3.27 MIL/uL — ABNORMAL LOW (ref 4.22–5.81)
RDW: 14.5 % (ref 11.5–15.5)
WBC: 7.4 10*3/uL (ref 4.0–10.5)
nRBC: 0 % (ref 0.0–0.2)

## 2023-03-11 LAB — HIV ANTIBODY (ROUTINE TESTING W REFLEX): HIV Screen 4th Generation wRfx: NONREACTIVE

## 2023-03-11 LAB — COMPREHENSIVE METABOLIC PANEL
ALT: 12 U/L (ref 0–44)
AST: 18 U/L (ref 15–41)
Albumin: 3.1 g/dL — ABNORMAL LOW (ref 3.5–5.0)
Alkaline Phosphatase: 85 U/L (ref 38–126)
Anion gap: 10 (ref 5–15)
BUN: 20 mg/dL (ref 6–20)
CO2: 26 mmol/L (ref 22–32)
Calcium: 8.9 mg/dL (ref 8.9–10.3)
Chloride: 98 mmol/L (ref 98–111)
Creatinine, Ser: 4.22 mg/dL — ABNORMAL HIGH (ref 0.61–1.24)
GFR, Estimated: 16 mL/min — ABNORMAL LOW (ref >60)
Glucose, Bld: 363 mg/dL — ABNORMAL HIGH (ref 70–99)
Potassium: 4.2 mmol/L (ref 3.5–5.1)
Sodium: 134 mmol/L — ABNORMAL LOW (ref 135–145)
Total Bilirubin: 0.7 mg/dL (ref 0.3–1.2)
Total Protein: 7.8 g/dL (ref 6.5–8.1)

## 2023-03-11 LAB — LACTIC ACID, PLASMA
Lactic Acid, Venous: 3.1 mmol/L (ref 0.5–1.9)
Lactic Acid, Venous: 2.1 mmol/L (ref 0.5–1.9)

## 2023-03-11 LAB — PROTIME-INR
INR: 1.1 (ref 0.8–1.2)
Prothrombin Time: 14.1 seconds (ref 11.4–15.2)

## 2023-03-11 LAB — APTT: aPTT: 28 seconds (ref 24–36)

## 2023-03-11 LAB — RESP PANEL BY RT-PCR (RSV, FLU A&B, COVID)  RVPGX2
Influenza A by PCR: NEGATIVE
Influenza B by PCR: NEGATIVE
Resp Syncytial Virus by PCR: NEGATIVE
SARS Coronavirus 2 by RT PCR: NEGATIVE

## 2023-03-11 LAB — GLUCOSE, CAPILLARY
Glucose-Capillary: 238 mg/dL — ABNORMAL HIGH (ref 70–99)
Glucose-Capillary: 166 mg/dL — ABNORMAL HIGH (ref 70–99)

## 2023-03-11 LAB — HEMOGLOBIN A1C
Hgb A1c MFr Bld: 9.3 % — ABNORMAL HIGH (ref 4.8–5.6)
Mean Plasma Glucose: 220.21 mg/dL

## 2023-03-11 MED ORDER — HYDROMORPHONE HCL 1 MG/ML IJ SOLN: 0.5000 mg | INTRAMUSCULAR | Status: DC | PRN

## 2023-03-11 MED ORDER — NEPRO/CARBSTEADY PO LIQD
237.0000 mL | Freq: Two times a day (BID) | ORAL | Status: DC
  Administered 2023-03-12 (×2): 237 mL via ORAL

## 2023-03-11 MED ORDER — CARVEDILOL 25 MG PO TABS
25.0000 mg | ORAL_TABLET | Freq: Two times a day (BID) | ORAL | Status: DC
  Administered 2023-03-11 – 2023-03-13 (×5): 25 mg via ORAL
  Filled 2023-03-11: qty 2
  Filled 2023-03-11 (×2): qty 1
  Filled 2023-03-11 (×2): qty 2

## 2023-03-11 MED ORDER — ATORVASTATIN CALCIUM 80 MG PO TABS
80.0000 mg | ORAL_TABLET | Freq: Every day | ORAL | Status: DC
  Administered 2023-03-11 – 2023-03-13 (×3): 80 mg via ORAL
  Filled 2023-03-11: qty 1
  Filled 2023-03-11 (×2): qty 2

## 2023-03-11 MED ORDER — VANCOMYCIN HCL 2000 MG/400ML IV SOLN
2000.0000 mg | Freq: Once | INTRAVENOUS | Status: AC
  Administered 2023-03-11: 2000 mg via INTRAVENOUS
  Filled 2023-03-11: qty 400

## 2023-03-11 MED ORDER — DIPHENHYDRAMINE HCL 50 MG/ML IJ SOLN
25.0000 mg | Freq: Once | INTRAMUSCULAR | Status: AC
  Administered 2023-03-11: 25 mg via INTRAVENOUS
  Filled 2023-03-11: qty 1

## 2023-03-11 MED ORDER — ACETAMINOPHEN 650 MG RE SUPP: 650.0000 mg | Freq: Four times a day (QID) | RECTAL | Status: DC | PRN

## 2023-03-11 MED ORDER — ACETAMINOPHEN 325 MG PO TABS
650.0000 mg | ORAL_TABLET | Freq: Four times a day (QID) | ORAL | Status: DC | PRN
  Administered 2023-03-13: 650 mg via ORAL
  Filled 2023-03-11: qty 2

## 2023-03-11 MED ORDER — ACETAMINOPHEN 325 MG PO TABS
650.0000 mg | ORAL_TABLET | Freq: Once | ORAL | Status: AC
  Administered 2023-03-11: 650 mg via ORAL
  Filled 2023-03-11: qty 2

## 2023-03-11 MED ORDER — LACTATED RINGERS IV SOLN: INTRAVENOUS | Status: DC

## 2023-03-11 MED ORDER — HYDRALAZINE HCL 20 MG/ML IJ SOLN: 10.0000 mg | Freq: Four times a day (QID) | INTRAMUSCULAR | Status: DC | PRN

## 2023-03-11 MED ORDER — ALBUTEROL SULFATE (2.5 MG/3ML) 0.083% IN NEBU
2.5000 mg | INHALATION_SOLUTION | RESPIRATORY_TRACT | Status: DC | PRN
  Administered 2023-03-12: 2.5 mg via RESPIRATORY_TRACT
  Filled 2023-03-11: qty 3

## 2023-03-11 MED ORDER — SODIUM CHLORIDE 0.9 % IV SOLN
2.0000 g | Freq: Once | INTRAVENOUS | Status: AC
  Administered 2023-03-11: 2 g via INTRAVENOUS
  Filled 2023-03-11: qty 12.5

## 2023-03-11 MED ORDER — ASPIRIN 81 MG PO TBEC
81.0000 mg | DELAYED_RELEASE_TABLET | Freq: Every day | ORAL | Status: DC
  Administered 2023-03-12 – 2023-03-13 (×2): 81 mg via ORAL
  Filled 2023-03-11 (×2): qty 1

## 2023-03-11 MED ORDER — VANCOMYCIN HCL IN DEXTROSE 1-5 GM/200ML-% IV SOLN: 1000.0000 mg | Freq: Once | INTRAVENOUS | Status: DC

## 2023-03-11 MED ORDER — ISOSORBIDE MONONITRATE ER 60 MG PO TB24
60.0000 mg | ORAL_TABLET | Freq: Every day | ORAL | Status: DC
  Administered 2023-03-11 – 2023-03-13 (×3): 60 mg via ORAL
  Filled 2023-03-11 (×3): qty 1

## 2023-03-11 MED ORDER — HEPARIN SODIUM (PORCINE) 5000 UNIT/ML IJ SOLN
5000.0000 [IU] | Freq: Three times a day (TID) | INTRAMUSCULAR | Status: DC
  Administered 2023-03-11 – 2023-03-14 (×8): 5000 [IU] via SUBCUTANEOUS
  Filled 2023-03-11 (×8): qty 1

## 2023-03-11 MED ORDER — METRONIDAZOLE 500 MG/100ML IV SOLN
500.0000 mg | Freq: Once | INTRAVENOUS | Status: AC
  Administered 2023-03-11: 500 mg via INTRAVENOUS
  Filled 2023-03-11: qty 100

## 2023-03-11 MED ORDER — INSULIN ASPART PROT & ASPART (70-30 MIX) 100 UNIT/ML ~~LOC~~ SUSP
18.0000 [IU] | Freq: Two times a day (BID) | SUBCUTANEOUS | Status: DC
  Administered 2023-03-11 – 2023-03-13 (×4): 18 [IU] via SUBCUTANEOUS
  Filled 2023-03-11 (×2): qty 10

## 2023-03-11 MED ORDER — INSULIN ASPART 100 UNIT/ML IJ SOLN
0.0000 [IU] | Freq: Every day | INTRAMUSCULAR | Status: DC
  Administered 2023-03-13: 2 [IU] via SUBCUTANEOUS

## 2023-03-11 MED ORDER — ALBUTEROL SULFATE (2.5 MG/3ML) 0.083% IN NEBU
2.5000 mg | INHALATION_SOLUTION | Freq: Four times a day (QID) | RESPIRATORY_TRACT | Status: DC
  Filled 2023-03-11: qty 3

## 2023-03-11 MED ORDER — INSULIN ASPART 100 UNIT/ML IJ SOLN
0.0000 [IU] | Freq: Three times a day (TID) | INTRAMUSCULAR | Status: DC
  Administered 2023-03-12 (×2): 3 [IU] via SUBCUTANEOUS
  Administered 2023-03-12: 2 [IU] via SUBCUTANEOUS
  Administered 2023-03-13: 3 [IU] via SUBCUTANEOUS
  Administered 2023-03-13: 2 [IU] via SUBCUTANEOUS

## 2023-03-11 MED ORDER — INSULIN ASPART 100 UNIT/ML IJ SOLN
5.0000 [IU] | Freq: Once | INTRAMUSCULAR | Status: AC
  Administered 2023-03-11: 5 [IU] via SUBCUTANEOUS
  Filled 2023-03-11: qty 0.05

## 2023-03-11 MED ORDER — LIDOCAINE HCL 2 % IJ SOLN
10.0000 mL | Freq: Once | INTRAMUSCULAR | Status: AC
  Administered 2023-03-11: 200 mg via INTRADERMAL
  Filled 2023-03-11: qty 20

## 2023-03-11 NOTE — ED Provider Notes (Addendum)
[place] [facility]ERGENCY DEPARTMENT AT [facility] Provider Note   CSN: [id number] Arrival date & time: 03/11/23  1202     History  Chief Complaint  Patient presents with   finger infection    Howard Mcmahon is a 52 y.o. male.  The history is provided by the patient and medical records. No language interpreter was used.     52 year old male with significant history of diabetes, peripheral vascular disease, hypertension, prior MI presenting with concerns of hand infection.  Patient is a Monday Wednesday Friday dialysis patient.  He received his dialysis today.  He endorsed a nonproductive cough, along with fever chills and bodyaches ongoing for the past week.  He also endorsed decrease in appetite.  He noticed pain and swelling to his left middle finger for the past 2 days now radiates towards his forearm.  Pain is sharp throbbing and moderate in intensity.  He is right-hand dominant.  Does not endorse any chest pain or shortness of breath denies any abdominal pain.  Does not make urine.  He mention his diabetes is well-controlled.  He denies any injury to his left hand but states that he does have a callus to his finger has been ongoing for months.  Home Medications Prior to Admission medications   Medication Sig Start Date End Date Taking? Authorizing Provider  acetaminophen (TYLENOL) 325 MG tablet Take 2 tablets (650 mg total) by mouth every 6 (six) hours as needed. Patient taking differently: Take 650 mg by mouth every 6 (six) hours as needed for mild pain. 08/14/21   Teressa Lower, PA-C  aspirin 81 MG EC tablet Take 1 tablet (81 mg total) by mouth at bedtime. Swallow whole. 01/20/22   Albertine Grates, MD  atorvastatin (LIPITOR) 80 MG tablet Take 80 mg by mouth daily. 10/03/21   [provider]  Blood Glucose Monitoring Suppl (TRUE METRIX METER) w/Device KIT Use as directed Patient taking differently: 1 each by Other route as directed. 01/02/19   Grayce Sessions, NP   carvedilol (COREG) 25 MG tablet Take 25 mg by mouth 2 (two) times daily. 10/03/21   [provider]  furosemide (LASIX) 20 MG tablet Take 1 tablet (20 mg total) by mouth daily for 14 doses. 07/03/22 07/17/22  Terald Sleeper, MD  glucose blood (TRUE METRIX BLOOD GLUCOSE TEST) test strip Check blood sugar fasting and before meals and again if pt feels bad (symptoms of hypo). Check sugar three times a day 10/10/19   Elpidio Anis, PA-C  hydrALAZINE (APRESOLINE) 100 MG tablet Take 1 tablet (100 mg total) by mouth 3 (three) times daily. 01/20/22   Albertine Grates, MD  Insulin Isophane & Regular Human (NOVOLIN 70/30 FLEXPEN RELION) (70-30) 100 UNIT/ML PEN Inject 18 Units into the skin 2 (two) times daily. Patient taking differently: Inject 36 Units into the skin 2 (two) times daily. 10/20/19   Osvaldo Shipper, MD  Insulin Pen Needle (NOVOFINE) 30G X 8 MM MISC Inject 10 each into the skin as needed. Patient taking differently: Inject 1 packet into the skin as needed (insulin). 10/20/19   Osvaldo Shipper, MD  isosorbide mononitrate (IMDUR) 60 MG 24 hr tablet Take 1 tablet (60 mg total) by mouth daily. 01/21/22   Albertine Grates, MD  nitroGLYCERIN (NITROSTAT) 0.4 MG SL tablet Place 0.4 mg under the tongue every 5 (five) minutes as needed for chest pain. 08/10/21   [provider]  oxyCODONE-acetaminophen (PERCOCET/ROXICET) 5-325 MG tablet Take 1 tablet by mouth every 12 (twelve)  hours as needed for up to 12 doses for severe pain. 11/10/22   Terald Sleeper, MD  pantoprazole (PROTONIX) 40 MG tablet Take 1 tablet (40 mg total) by mouth daily. 01/20/22   Albertine Grates, MD  Semaglutide,0.25 or 0.5MG /DOS, 2 MG/1.5ML SOPN Inject 0.25 mg into the skin once a week. Please follow up with your pcp regarding this meds 01/20/22   Albertine Grates, MD  VENTOLIN HFA 108 [id number] Base) MCG/ACT inhaler Inhale 1-2 puffs into the lungs daily as needed for wheezing or shortness of breath. 03/02/21   [provider]      Allergies     Lisinopril, Lactose intolerance (gi), and Other    Review of Systems   Review of Systems  All other systems reviewed and are negative.   Physical Exam Updated Vital Signs BP (!) 154/86 (BP Location: Right Arm)   Pulse (!) 108   Temp (!) 100.9 F (38.3 C) (Oral)   Resp 18   Ht 6\' 4"  (1.93 m)   Wt 130 kg   SpO2 100%   BMI 34.89 kg/m  Physical Exam Vitals and nursing note reviewed.  Constitutional:      General: He is not in acute distress.    Appearance: He is well-developed.     Comments: Chronically ill-appearing male laying in bed appears to be in some mild discomfort.  HENT:     Head: Atraumatic.  Eyes:     Conjunctiva/sclera: Conjunctivae normal.  Cardiovascular:     Rate and Rhythm: Tachycardia present.     Pulses: Normal pulses.     Heart sounds: Normal heart sounds.  Pulmonary:     Effort: Pulmonary effort is normal.     Breath sounds: Normal breath sounds. No wheezing, rhonchi or rales.  Abdominal:     Palpations: Abdomen is soft.  Musculoskeletal:        General: Tenderness (L middle finger there is an area of induration with associate fluctuant noted to the PIP approximately 3 cm in diameter with an overlying callus.  Able to flex and extend the joint but does have some tenderness to forearm with movement.  No lymphangitis.) present.     Cervical back: Normal range of motion and neck supple. No rigidity.     Comments: Left BKA with normal-appearing stump  Skin:    Findings: No rash.     Comments: Left AV fistula with palpable thrill and bruit.  Neurological:     Mental Status: He is alert. Mental status is at baseline.     ED Results / Procedures / Treatments   Labs (all labs ordered are listed, but only abnormal results are displayed) Labs Reviewed  LACTIC ACID, PLASMA - Abnormal; Notable for the following components:      Result Value   Lactic Acid, Venous 3.1 (*)    All other components within normal limits  COMPREHENSIVE METABOLIC PANEL -  Abnormal; Notable for the following components:   Sodium 134 (*)    Glucose, Bld 363 (*)    Creatinine, Ser 4.22 (*)    Albumin 3.1 (*)    GFR, Estimated 16 (*)    All other components within normal limits  CBC WITH DIFFERENTIAL/PLATELET - Abnormal; Notable for the following components:   RBC 3.27 (*)    Hemoglobin 10.5 (*)    HCT 32.9 (*)    MCV 100.6 (*)    All other components within normal limits  RESP PANEL BY RT-PCR (RSV, FLU A&B, COVID)  RVPGX2  CULTURE, BLOOD (ROUTINE X 2)  CULTURE, BLOOD (ROUTINE X 2)  AEROBIC CULTURE W GRAM STAIN (SUPERFICIAL SPECIMEN)  PROTIME-INR  APTT  LACTIC ACID, PLASMA    EKG EKG Interpretation Date/Time:  Monday March 11 2023 12:24:13 EDT Ventricular Rate:  110 PR Interval:  171 QRS Duration:  88 QT Interval:  325 QTC Calculation: 440 R Axis:   4  Text Interpretation: Sinus tachycardia Ventricular premature complex Confirmed by Cathren Laine ([id number]) on 03/11/2023 12:32:32 PM  Radiology DG Finger Middle Left  Result Date: 03/11/2023 CLINICAL DATA:  Pain and swelling EXAM: LEFT MIDDLE FINGER 2+V COMPARISON:  None Available. FINDINGS: No displaced fracture or dislocation is seen. No focal lytic lesions are seen. There is marked soft tissue swelling over the dorsal and lateral aspect of PIP joint. There is 1.6 cm smoothly marginated low-density in the area of soft tissue swelling. There is linear calcification in the medial margin of the neck of the third metacarpal. This may be residual from previous injury. IMPRESSION: No recent fracture or dislocation is seen in left middle finger. No focal lytic lesions are seen. There is marked soft tissue swelling over the PIP joint. There is 1.6 cm area of smooth marginated low-density in the area of soft tissue swelling. This may suggest air trapped in the soft tissues due to infectious process or an open wound in the skin. If there is clinical suspicion for osteomyelitis, follow-up MRI may be considered.  Electronically Signed   By: Ernie Avena M.D.   On: 03/11/2023 13:21   DG Chest Port 1 View  Result Date: 03/11/2023 CLINICAL DATA:  Possible sepsis EXAM: PORTABLE CHEST 1 VIEW COMPARISON:  Previous studies including the examination of 11/10/2022 FINDINGS: Transverse diameter of heart is increased. Central pulmonary vessels are prominent without signs of alveolar pulmonary edema. There is no focal pulmonary consolidation. Linear densities are seen in right lower lung field. Costophrenic angles are clear. There is no pneumothorax. Metallic sutures are seen in the sternum. There is interval removal of right IJ dialysis catheter. IMPRESSION: There are no signs of pulmonary edema or focal pulmonary consolidation. Linear densities in right lower lung field may suggest crowding of normal bronchovascular structures or subsegmental atelectasis. Electronically Signed   By: Ernie Avena M.D.   On: 03/11/2023 13:15    Procedures .Marland KitchenIncision and Drainage  Date/Time: 03/11/2023 2:16 PM  Performed by: Fayrene Helper, PA-C Authorized by: Fayrene Helper, PA-C   Consent:    Consent obtained:  Verbal   Consent given by:  Patient   Risks discussed:  Bleeding and damage to other organs   Alternatives discussed:  No treatment Universal protocol:    Procedure explained and questions answered to patient or proxy's satisfaction: yes     Relevant documents present and verified: yes     Patient identity confirmed:  Verbally with patient Location:    Type:  Abscess   Size:  3   Location:  Upper extremity   Upper extremity location:  Finger   Finger location:  L long finger Pre-procedure details:    Skin preparation:  Antiseptic wash Sedation:    Sedation type:  None Anesthesia:    Anesthesia method:  Nerve block   Block location:  Digital   Block needle gauge:  25 G   Block anesthetic:  Lidocaine 1% w/o epi   Block technique:  Digital   Block injection procedure:  Anatomic landmarks identified,  introduced needle, incremental injection, anatomic landmarks palpated and negative aspiration for blood  Block outcome:  Anesthesia achieved Procedure type:    Complexity:  Simple Procedure details:    Ultrasound guidance: no     Incision types:  Stab incision   Incision depth:  Subcutaneous   Wound management:  Probed and deloculated   Drainage:  Bloody and purulent   Drainage amount:  Moderate   Wound treatment:  Wound left open   Packing materials:  None Post-procedure details:    Procedure completion:  Tolerated well, no immediate complications .Critical Care  Performed by: Fayrene Helper, PA-C Authorized by: Fayrene Helper, PA-C   Critical care provider statement:    Critical care time (minutes):  38   Critical care was time spent personally by me on the following activities:  Development of treatment plan with patient or surrogate, discussions with consultants, evaluation of patient's response to treatment, examination of patient, ordering and review of laboratory studies, ordering and review of radiographic studies, ordering and performing treatments and interventions, pulse oximetry, re-evaluation of patient's condition and review of old charts     Medications Ordered in ED Medications  lactated ringers infusion ( Intravenous New Bag/Given 03/11/23 1323)  vancomycin (VANCOREADY) IVPB 2000 mg/400 mL (has no administration in time range)  insulin aspart (novoLOG) injection 5 Units (has no administration in time range)  ceFEPIme (MAXIPIME) 2 g in sodium chloride 0.9 % 100 mL IVPB (0 g Intravenous Stopped 03/11/23 1409)  metroNIDAZOLE (FLAGYL) IVPB 500 mg (500 mg Intravenous New Bag/Given 03/11/23 1412)  acetaminophen (TYLENOL) tablet 650 mg (650 mg Oral Given 03/11/23 1316)  lidocaine (XYLOCAINE) 2 % (with pres) injection 200 mg (200 mg Intradermal Given by Other 03/11/23 1317)    ED Course/ Medical Decision Making/ A&P                             Medical Decision Making Amount and/or  Complexity of Data Reviewed Labs: ordered. Radiology: ordered.  Risk OTC drugs. Prescription drug management. Decision regarding hospitalization.   BP (!) 154/86 (BP Location: Right Arm)   Pulse (!) 108   Temp (!) 100.9 F (38.3 C) (Oral)   Resp 18   Ht 6\' 4"  (1.93 m)   Wt 130 kg   SpO2 100%   BMI 34.89 kg/m   61:57 PM  52 year old male with significant history of diabetes, peripheral vascular disease, hypertension, prior MI presenting with concerns of hand infection.  Patient is a Monday Wednesday Friday dialysis patient.  He received his dialysis today.  He endorsed a nonproductive cough, along with fever chills and bodyaches ongoing for the past week.  He also endorsed decrease in appetite.  He noticed pain and swelling to his left middle finger for the past 2 days now radiates towards his forearm.  Pain is sharp throbbing and moderate in intensity.  He is right-hand dominant.  Does not endorse any chest pain or shortness of breath denies any abdominal pain.  Does not make urine.  He mention his diabetes is well-controlled.  He denies any injury to his left hand but states that he does have a callus to his finger has been ongoing for months.  On exam this is a chronically ill-appearing male appears uncomfortable laying in bed.  Heart with tachycardia, lungs clear to auscultation abdomen is soft nontender on the left upper extremity there is an area of induration and fluctuant approximately 3 cm in diameter noted to the dorsum of the left middle finger overlying the PIP joint.  There is a callus there.  He is able to flex and extend the joint but he does have some tenderness that radiates towards his forearm concerning for tenosynovitis.  He has a palpable left AV fistula without signs of infection.  He has a left BKA.  Vital signs notable for an oral temperature of 100.9, heart rate of 108, no hypoxia.  Blood pressure is 154/86.  Code sepsis initiated.  Will incise and drain his finger  abscess but I am also concerned for potential COVID or pneumonia given his persistent cough.  -Labs ordered, independently viewed and interpreted by me.  Labs remarkable for CBG 363 with anion gap of 16 concerns for DKA. Impaired renal function due to underlying ESRD on dialysis.  Normal WBC, lactic acid 3.1.  negative covid, flu, rsv,  -The patient was maintained on a cardiac monitor.  I personally viewed and interpreted the cardiac monitored which showed an underlying rhythm of: sinus tachycardia -Imaging independently viewed and interpreted by me and I agree with radiologist's interpretation.  Result remarkable for L middle finger showing abscess, CXR without pneumonia -This patient presents to the ED for concern of finger infection, this involves an extensive number of treatment options, and is a complaint that carries with it a high risk of complications and morbidity.  The differential diagnosis includes abscess, cellulitis, septic joint, osteomyelitis, tenosynovitis, fx, dislocation -Co morbidities that complicate the patient evaluation includes DM, ESRD,  -Treatment includes broad spectrum abx, insulin, I&D, tylenol -Reevaluation of the patient after these medicines showed that the patient improved -PCP office notes or outside notes reviewed -Discussion with specialist [facility] Dr. Pola Corn who agrees to admit pt but request hand specialist to offer recommendation.  -Escalation to admission/observation considered: patient agreeable with admission  3:15 PM Appreciate consultation from [facility] who agrees to admit patient but request for hand specialist to be involved as well.  I reached out and spoke with orthopedist PA Earney Hamburg who agrees to evaluate patient and will offer recommendation.   3:44 PM Nurse notified after receiving vancomycin patient developed itchiness and hives.  On reassessment, he does have some faint urticaria rash.  I request for vancomycin to be  given at a slower rate, will give Benadryl, will monitor and will consider DC vancomycin if symptoms persist.  No evidence of airway compromise.     Final Clinical Impression(s) / ED Diagnoses Final diagnoses:  Abscess of left middle finger  Sepsis, due to unspecified organism, unspecified whether acute organ dysfunction present [facility])  Acute cough    Rx / DC Orders ED Discharge Orders     None         Fayrene Helper, PA-C 03/11/23 1516    Cathren Laine, MD 03/11/23 1526    Fayrene Helper, PA-C 03/11/23 1544    Cathren Laine, MD 03/12/23 1320

## 2023-03-11 NOTE — Progress Notes (Addendum)
Pharmacy Antibiotic Note  Howard Mcmahon is a 52 y.o. male with PMH of ESRD on HD, DM2, HTN, HLD, CAD/MI, ischemic cardiomyopathy, PAD s/p left BKA admitted on 03/11/2023 with left middle finger cellulitis and abscess. Pharmacy has been consulted for Vancomycin and Cefepime dosing. First doses of antibiotics given in the ED. Of note, patient developed itchiness and hives after starting Vancomycin. EDP assessed and recommended slowing down infusion rate and dose of Benadryl also given. Discussed with hospitalist, Dr. Pola Corn, who suspects this was Red man syndrome and is okay to continue with Vancomycin as ordered.  Plan: Vancomycin 1g IV qHD - will slow down infusion due to Red man syndrome with initial dose in ED Cefepime 2g IV qHD  Monitor cultures, clinical course  Height: 6\' 4"  (193 cm) Weight: 130 kg (286 lb 9.6 oz) IBW/kg (Calculated) : 86.8  Temp (24hrs), Avg:100 F (37.8 C), Min:99 F (37.2 C), Max:100.9 F (38.3 C)  Recent Labs  Lab 03/11/23 1311 03/11/23 1315  WBC  --  7.4  CREATININE  --  4.22*  LATICACIDVEN 3.1*  --     Estimated Creatinine Clearance: 30.8 mL/min (A) (by C-G formula based on SCr of 4.22 mg/dL (H)).    Allergies  Allergen Reactions   Lisinopril Swelling   Lactose Intolerance (Gi)    Other Diarrhea    Lactulose intolerant Lactulose intolerant    Antimicrobials this admission: 7/1 Vancomycin >> 7/1 Cefepime >> 7/1 Metronidazole x 1  Dose adjustments this admission: --  Microbiology results: 7/1 BCx:  7/1 left finger wound (superficial specimen):   Thank you for allowing pharmacy to be a part of this patient's care.   Greer Pickerel, PharmD, BCPS Clinical Pharmacist 03/11/2023 4:21 PM

## 2023-03-11 NOTE — Progress Notes (Signed)
[facility] following for sepsis protocol. 

## 2023-03-11 NOTE — H&P (Signed)
[facility] History and Physical  Howard Mcmahon UJW:[id number] DOB: Nov 25, 1970 DOA: 03/11/2023 PCP: [facility]  Admitted from: Home Chief Complaint: Hand infection  History of Present Illness: Howard Mcmahon is a 52 y.o. male with PMH significant for ESRD-HD-MWF, DM2, HTN, HLD, CAD/MI, ischemic cardiomyopathy, chronic combined CHF, PAD s/p left BKA, GERD, chronic anemia, chronic anxiety/depression Patient presented to [facility] ED today with concern of infection in his hand.  He noticed pain and swelling of his left middle finger 2 days ago that radiated towards the forearm.  It started from a callus he had for the last several months.  Pain is sharp, throbbing and moderate intensity Patient also endorsed nonproductive cough subjective fever, body ache and poor appetite for a week  In the ED, patient had a fever 100.9, heart rate in 100s, blood pressure 154/86, breathing on room air On exam, there is an area of induration and fluctuation was noted on a callus over left middle finger PIP joint, 3 cm diameter.  ED physician did I&D and expressed some pus. Left finger x-ray did not show any evidence of recent fracture or dislocation or any focal lytic lesion.  It showed marked cellulitis, 1.6 cm area of smooth marginated swelling suggestive of air trapped in the soft tissues. Labs showed WBC count normal 7.4, hemoglobin 10.5, sodium 134, lactic acid 3.1 glucose 363 Blood culture was sent Abscess culture sent Chest x-ray did not show any acute abnormality. Patient was started on broad-spectrum IV antibiotic coverage with IV vancomycin and IV cefepime Hospitalist service consulted for hospitalization and management.  At the time of my evaluation, patient was lying down on bed. Pain controlled. Irritated when I mentioned that he needs to be transferred to [facility] because of dialysis need.  Review of Systems:  All systems were reviewed and were negative unless otherwise mentioned in the  HPI   Past medical history: Past Medical History:  Diagnosis Date   Chronic kidney disease    on dialysis   Diabetes mellitus without complication (HCC)    Hypertension    Macular infarction    2012   MI (myocardial infarction) (HCC)    Peripheral vascular disease (HCC)    Neuropathy   Pneumonia     Past surgical history: Past Surgical History:  Procedure Laterality Date   CARDIAC SURGERY     CORONARY ANGIOPLASTY WITH STENT PLACEMENT     CORONARY ARTERY BYPASS GRAFT     HIP ARTHROPLASTY Right     Social History:  reports that he has quit smoking. His smoking use included cigarettes. He has a 60.00 pack-year smoking history. He uses smokeless tobacco. He reports that he does not drink alcohol and does not use drugs.  Allergies:  Allergies  Allergen Reactions   Lisinopril Swelling   Lactose Intolerance (Gi)    Other Diarrhea    Lactulose intolerant Lactulose intolerant   Lisinopril, Lactose intolerance (gi), and Other   Family history:  Family History  Problem Relation Age of Onset   Stroke Mother      Home Meds: Prior to Admission medications   Medication Sig Start Date End Date Taking? Authorizing Provider  acetaminophen (TYLENOL) 325 MG tablet Take 2 tablets (650 mg total) by mouth every 6 (six) hours as needed. Patient taking differently: Take 650 mg by mouth every 6 (six) hours as needed for mild pain. 08/14/21   Teressa Lower, PA-C  aspirin 81 MG EC tablet Take 1 tablet (81 mg total) by mouth at  bedtime. Swallow whole. 01/20/22   Albertine Grates, MD  atorvastatin (LIPITOR) 80 MG tablet Take 80 mg by mouth daily. 10/03/21   [provider]  Blood Glucose Monitoring Suppl (TRUE METRIX METER) w/Device KIT Use as directed Patient taking differently: 1 each by Other route as directed. 01/02/19   Grayce Sessions, NP  carvedilol (COREG) 25 MG tablet Take 25 mg by mouth 2 (two) times daily. 10/03/21   [provider]  furosemide (LASIX) 20 MG tablet  Take 1 tablet (20 mg total) by mouth daily for 14 doses. 07/03/22 07/17/22  Terald Sleeper, MD  glucose blood (TRUE METRIX BLOOD GLUCOSE TEST) test strip Check blood sugar fasting and before meals and again if pt feels bad (symptoms of hypo). Check sugar three times a day 10/10/19   Elpidio Anis, PA-C  hydrALAZINE (APRESOLINE) 100 MG tablet Take 1 tablet (100 mg total) by mouth 3 (three) times daily. 01/20/22   Albertine Grates, MD  Insulin Isophane & Regular Human (NOVOLIN 70/30 FLEXPEN RELION) (70-30) 100 UNIT/ML PEN Inject 18 Units into the skin 2 (two) times daily. Patient taking differently: Inject 36 Units into the skin 2 (two) times daily. 10/20/19   Osvaldo Shipper, MD  Insulin Pen Needle (NOVOFINE) 30G X 8 MM MISC Inject 10 each into the skin as needed. Patient taking differently: Inject 1 packet into the skin as needed (insulin). 10/20/19   Osvaldo Shipper, MD  isosorbide mononitrate (IMDUR) 60 MG 24 hr tablet Take 1 tablet (60 mg total) by mouth daily. 01/21/22   Albertine Grates, MD  nitroGLYCERIN (NITROSTAT) 0.4 MG SL tablet Place 0.4 mg under the tongue every 5 (five) minutes as needed for chest pain. 08/10/21   [provider]  oxyCODONE-acetaminophen (PERCOCET/ROXICET) 5-325 MG tablet Take 1 tablet by mouth every 12 (twelve) hours as needed for up to 12 doses for severe pain. 11/10/22   Terald Sleeper, MD  pantoprazole (PROTONIX) 40 MG tablet Take 1 tablet (40 mg total) by mouth daily. 01/20/22   Albertine Grates, MD  Semaglutide,0.25 or 0.5MG /DOS, 2 MG/1.5ML SOPN Inject 0.25 mg into the skin once a week. Please follow up with your pcp regarding this meds 01/20/22   Albertine Grates, MD  VENTOLIN HFA 108 [phone] Base) MCG/ACT inhaler Inhale 1-2 puffs into the lungs daily as needed for wheezing or shortness of breath. 03/02/21   [provider]    Physical Exam: Vitals:   03/11/23 1400 03/11/23 1415 03/11/23 1430 03/11/23 1453  BP: (!) 164/88 (!) 167/85 (!) 148/78 (!) 154/92  Pulse: (!) 111 (!) 105 (!) 107  (!) 105  Resp: 17 15 (!) 22 19  Temp:    99 F (37.2 C)  TempSrc:      SpO2: (!) 88% 100% 94% 95%  Weight:      Height:       Wt Readings from Last 3 Encounters:  03/11/23 130 kg  07/02/22 117.9 kg  01/15/22 104.3 kg   Body mass index is 34.89 kg/m.  General exam: Pleasant, middle-aged obese African-American male.  Not in distress Skin: No rashes, lesions or ulcers. HEENT: Atraumatic, normocephalic, no obvious bleeding Lungs: Clear to auscultation bilaterally CVS: Regular rate and rhythm, no murmur GI/Abd soft, nontender, distended from obesity, bowel sound present CNS: Alert, awake, oriented x 3 Psychiatry: Irritable Extremities: Prior left BKA status.    ------------------------------------------------------------------------------------------------------ Assessment/Plan: Principal Problem:   Sepsis (HCC)  Sepsis POA Left middle finger cellulitis and abscess  Presented with worsening pain, fever,  lactic acidosis X-ray without evidence of osteomyelitis Drained in the ED Blood culture and wound culture sent Started on broad-spectrum antibiotics WBC count normal but lipase level was elevated.  Repeat labs tomorrow. Recent Labs  Lab 03/11/23 1311 03/11/23 1315  WBC  --  7.4  LATICACIDVEN 3.1*  --    ESRD-HD-MWF Last dialysis today prior to presentation.  No need of urgent dialysis.  Next routine dialysis on Wednesday.  Nephrology Dr. Ronalee Belts made aware of admission.  Type 2 diabetes mellitus with hyperglycemia A1c 6 in 2023.  Repeat A1c Blood sugar level was elevated to 360s in the ED likely because of acute infection. PTA meds-70/30 insulin 36 units twice daily Blood sugar level running over 300.  Resume home regimen insulin at low dose of 18 units twice daily No results for input(s): "GLUCAP" in the last 168 hours.  CAD/MI PAD s/p left BKA HLD PTA meds -aspirin 81 mg daily, Lipitor 80 mg daily  ischemic cardiomyopathy chronic combined CHF HTN PTA  meds-Coreg, hydralazine, Imdur Resume Coreg and Imdur.  Keep hydralazine on hold.  Chronic anemia GERD  Protonix  Chronic anxiety/depression  [facility] has not completed patient's home med list yet.  I have resumed what patient told me.  Please recheck again tomorrow  Goals of care   Code Status: Full Code    DVT prophylaxis: Heparin subcu    Antimicrobials: IV vancomycin, IV cefepime Fluid: None Consultants: Nephrology Family Communication: None at bedside  Dispo: The patient is from: Home              Anticipated d/c is to: Pending clinical course  Diet: Diet Order             Diet NPO time specified  Diet effective now                    ------------------------------------------------------------------------------------- Severity of Illness: The appropriate patient status for this patient is INPATIENT. Inpatient status is judged to be reasonable and necessary in order to provide the required intensity of service to ensure the patient's safety. The patient's presenting symptoms, physical exam findings, and initial radiographic and laboratory data in the context of their chronic comorbidities is felt to place them at high risk for further clinical deterioration. Furthermore, it is not anticipated that the patient will be medically stable for discharge from the hospital within 2 midnights of admission.   * I certify that at the point of admission it is my clinical judgment that the patient will require inpatient hospital care spanning beyond 2 midnights from the point of admission due to high intensity of service, high risk for further deterioration and high frequency of surveillance required.* -------------------------------------------------------------------------------------   Labs on Admission:   CBC: Recent Labs  Lab 03/11/23 1315  WBC 7.4  NEUTROABS 5.5  HGB 10.5*  HCT 32.9*  MCV 100.6*  PLT 181    Basic Metabolic Panel: Recent Labs  Lab  03/11/23 1315  NA 134*  K 4.2  CL 98  CO2 26  GLUCOSE 363*  BUN 20  CREATININE 4.22*  CALCIUM 8.9    Liver Function Tests: Recent Labs  Lab 03/11/23 1315  AST 18  ALT 12  ALKPHOS 85  BILITOT 0.7  PROT 7.8  ALBUMIN 3.1*   No results for input(s): "LIPASE", "AMYLASE" in the last 168 hours. No results for input(s): "AMMONIA" in the last 168 hours.  Cardiac Enzymes: No results for input(s): "CKTOTAL", "CKMB", "CKMBINDEX", "TROPONINI" in the last 168 hours.  BNP (last 3 results) Recent Labs    11/10/22 1422  BNP 297.6*    ProBNP (last 3 results) No results for input(s): "PROBNP" in the last 8760 hours.  CBG: No results for input(s): "GLUCAP" in the last 168 hours.  Lipase  No results found for: "LIPASE"   Urinalysis    Component Value Date/Time   COLORURINE STRAW (A) 07/02/2022 2035   APPEARANCEUR CLEAR 07/02/2022 2035   LABSPEC 1.009 07/02/2022 2035   PHURINE 5.0 07/02/2022 2035   GLUCOSEU 50 (A) 07/02/2022 2035   HGBUR SMALL (A) 07/02/2022 2035   BILIRUBINUR NEGATIVE 07/02/2022 2035   BILIRUBINUR negative 01/02/2019 1136   KETONESUR NEGATIVE 07/02/2022 2035   PROTEINUR 100 (A) 07/02/2022 2035   UROBILINOGEN 1.0 01/02/2019 1136   UROBILINOGEN 1.0 08/26/2014 0455   NITRITE NEGATIVE 07/02/2022 2035   LEUKOCYTESUR TRACE (A) 07/02/2022 2035     Drugs of Abuse     Component Value Date/Time   LABOPIA NONE DETECTED 01/10/2014 1730   COCAINSCRNUR NONE DETECTED 01/10/2014 1730   LABBENZ NONE DETECTED 01/10/2014 1730   AMPHETMU NONE DETECTED 01/10/2014 1730   THCU NONE DETECTED 01/10/2014 1730   LABBARB NONE DETECTED 01/10/2014 1730      Radiological Exams on Admission: DG Finger Middle Left  Result Date: 03/11/2023 CLINICAL DATA:  Pain and swelling EXAM: LEFT MIDDLE FINGER 2+V COMPARISON:  None Available. FINDINGS: No displaced fracture or dislocation is seen. No focal lytic lesions are seen. There is marked soft tissue swelling over the dorsal and  lateral aspect of PIP joint. There is 1.6 cm smoothly marginated low-density in the area of soft tissue swelling. There is linear calcification in the medial margin of the neck of the third metacarpal. This may be residual from previous injury. IMPRESSION: No recent fracture or dislocation is seen in left middle finger. No focal lytic lesions are seen. There is marked soft tissue swelling over the PIP joint. There is 1.6 cm area of smooth marginated low-density in the area of soft tissue swelling. This may suggest air trapped in the soft tissues due to infectious process or an open wound in the skin. If there is clinical suspicion for osteomyelitis, follow-up MRI may be considered. Electronically Signed   By: Ernie Avena M.D.   On: 03/11/2023 13:21   DG Chest Port 1 View  Result Date: 03/11/2023 CLINICAL DATA:  Possible sepsis EXAM: PORTABLE CHEST 1 VIEW COMPARISON:  Previous studies including the examination of 11/10/2022 FINDINGS: Transverse diameter of heart is increased. Central pulmonary vessels are prominent without signs of alveolar pulmonary edema. There is no focal pulmonary consolidation. Linear densities are seen in right lower lung field. Costophrenic angles are clear. There is no pneumothorax. Metallic sutures are seen in the sternum. There is interval removal of right IJ dialysis catheter. IMPRESSION: There are no signs of pulmonary edema or focal pulmonary consolidation. Linear densities in right lower lung field may suggest crowding of normal bronchovascular structures or subsegmental atelectasis. Electronically Signed   By: Ernie Avena M.D.   On: 03/11/2023 13:15     Signed, Lorin Glass, MD [facility] 03/11/2023

## 2023-03-11 NOTE — Consult Note (Signed)
Reason for Consult:Left hand infection Referring Physician: Melina Schools Dahal Time called: 1514 Time at bedside: 1542   Howard Mcmahon is an 52 y.o. male.  HPI: Howard Mcmahon has been having URI s/sx for about a week. He's also had pain and swelling of his left long finger for a couple of days. He came to the ED today for evaluation. Superficial long finger abscess was I&D'd by EDPA. He was admitted and hand surgery was consulted for pain extending up the arm. He is RHD and is ESRD.  Past Medical History:  Diagnosis Date   Chronic kidney disease    on dialysis   Diabetes mellitus without complication (HCC)    Hypertension    Macular infarction    2012   MI (myocardial infarction) (HCC)    Peripheral vascular disease (HCC)    Neuropathy   Pneumonia     Past Surgical History:  Procedure Laterality Date   CARDIAC SURGERY     CORONARY ANGIOPLASTY WITH STENT PLACEMENT     CORONARY ARTERY BYPASS GRAFT     HIP ARTHROPLASTY Right     Family History  Problem Relation Age of Onset   Stroke Mother     Social History:  reports that he has quit smoking. His smoking use included cigarettes. He has a 60.00 pack-year smoking history. He uses smokeless tobacco. He reports that he does not drink alcohol and does not use drugs.  Allergies:  Allergies  Allergen Reactions   Lisinopril Swelling   Lactose Intolerance (Gi)    Other Diarrhea    Lactulose intolerant Lactulose intolerant    Medications: I have reviewed the patient's current medications.  Results for orders placed or performed during the hospital encounter of 03/11/23 (from the past 48 hour(s))  Resp panel by RT-PCR (RSV, Flu A&B, Covid) Anterior Nasal Swab     Status: None   Collection Time: 03/11/23  1:07 PM   Specimen: Anterior Nasal Swab  Result Value Ref Range   SARS Coronavirus 2 by RT PCR NEGATIVE NEGATIVE    Comment: (NOTE) SARS-CoV-2 target nucleic acids are NOT DETECTED.  The SARS-CoV-2 RNA is generally detectable in upper  respiratory specimens during the acute phase of infection. The lowest concentration of SARS-CoV-2 viral copies this assay can detect is 138 copies/mL. A negative result does not preclude SARS-Cov-2 infection and should not be used as the sole basis for treatment or other patient management decisions. A negative result may occur with  improper specimen collection/handling, submission of specimen other than nasopharyngeal swab, presence of viral mutation(s) within the areas targeted by this assay, and inadequate number of viral copies(<138 copies/mL). A negative result must be combined with clinical observations, patient history, and epidemiological information. The expected result is Negative.  Fact Sheet for Patients:  BloggerCourse.com  Fact Sheet for Healthcare Providers:  SeriousBroker.it  This test is no t yet approved or cleared by the [place] FDA and  has been authorized for detection and/or diagnosis of SARS-CoV-2 by FDA under an Emergency Use Authorization (EUA). This EUA will remain  in effect (meaning this test can be used) for the duration of the COVID-19 declaration under Section 564(b)(1) of the Act, 21 U.S.C.section 360bbb-3(b)(1), unless the authorization is terminated  or revoked sooner.       Influenza A by PCR NEGATIVE NEGATIVE   Influenza B by PCR NEGATIVE NEGATIVE    Comment: (NOTE) The Xpert Xpress SARS-CoV-2/FLU/RSV plus assay is intended as an aid in the diagnosis of influenza from Nasopharyngeal swab  specimens and should not be used as a sole basis for treatment. Nasal washings and aspirates are unacceptable for Xpert Xpress SARS-CoV-2/FLU/RSV testing.  Fact Sheet for Patients: BloggerCourse.com  Fact Sheet for Healthcare Providers: SeriousBroker.it  This test is not yet approved or cleared by the [place] FDA and has been authorized for  detection and/or diagnosis of SARS-CoV-2 by FDA under an Emergency Use Authorization (EUA). This EUA will remain in effect (meaning this test can be used) for the duration of the COVID-19 declaration under Section 564(b)(1) of the Act, 21 U.S.C. section 360bbb-3(b)(1), unless the authorization is terminated or revoked.     Resp Syncytial Virus by PCR NEGATIVE NEGATIVE    Comment: (NOTE) Fact Sheet for Patients: BloggerCourse.com  Fact Sheet for Healthcare Providers: SeriousBroker.it  This test is not yet approved or cleared by the [place] FDA and has been authorized for detection and/or diagnosis of SARS-CoV-2 by FDA under an Emergency Use Authorization (EUA). This EUA will remain in effect (meaning this test can be used) for the duration of the COVID-19 declaration under Section 564(b)(1) of the Act, 21 U.S.C. section 360bbb-3(b)(1), unless the authorization is terminated or revoked.  Performed at [facility], [address], [place], [place] [address]   Lactic acid, plasma     Status: Abnormal   Collection Time: 03/11/23  1:11 PM  Result Value Ref Range   Lactic Acid, Venous 3.1 (HH) 0.5 - 1.9 mmol/L    Comment: CRITICAL RESULT CALLED TO, READ BACK BY AND VERIFIED WITH RN P DOWD AT 1358 03/11/23 CRUICKSHANK A Performed at [facility], [address] [place], [place] [address]   Comprehensive metabolic panel     Status: Abnormal   Collection Time: 03/11/23  1:15 PM  Result Value Ref Range   Sodium 134 (L) 135 - 145 mmol/L   Potassium 4.2 3.5 - 5.1 mmol/L   Chloride 98 98 - 111 mmol/L   CO2 26 22 - 32 mmol/L   Glucose, Bld 363 (H) 70 - 99 mg/dL    Comment: Glucose reference range applies only to samples taken after fasting for at least 8 hours.   BUN 20 6 - 20 mg/dL   Creatinine, Ser 0.98 (H) 0.61 - 1.24 mg/dL   Calcium 8.9 8.9 - 11.9 mg/dL   Total Protein 7.8 6.5 - 8.1 g/dL    Albumin 3.1 (L) 3.5 - 5.0 g/dL   AST 18 15 - 41 U/L   ALT 12 0 - 44 U/L   Alkaline Phosphatase 85 38 - 126 U/L   Total Bilirubin 0.7 0.3 - 1.2 mg/dL   GFR, Estimated 16 (L) >60 mL/min    Comment: (NOTE) Calculated using the CKD-EPI Creatinine Equation (2021)    Anion gap 10 5 - 15    Comment: Performed at [facility], [address], [place], [place] [address]  CBC with Differential     Status: Abnormal   Collection Time: 03/11/23  1:15 PM  Result Value Ref Range   WBC 7.4 4.0 - 10.5 K/uL   RBC 3.27 (L) 4.22 - 5.81 MIL/uL   Hemoglobin 10.5 (L) 13.0 - 17.0 g/dL   HCT 95.6 (L) 21.3 - 08.6 %   MCV 100.6 (H) 80.0 - 100.0 fL   MCH 32.1 26.0 - 34.0 pg   MCHC 31.9 30.0 - 36.0 g/dL   RDW 57.8 46.9 - 62.9 %   Platelets 181 150 - 400 K/uL   nRBC 0.0 0.0 -  0.2 %   Neutrophils Relative % 75 %   Neutro Abs 5.5 1.7 - 7.7 K/uL   Lymphocytes Relative 13 %   Lymphs Abs 0.9 0.7 - 4.0 K/uL   Monocytes Relative 9 %   Monocytes Absolute 0.7 0.1 - 1.0 K/uL   Eosinophils Relative 3 %   Eosinophils Absolute 0.3 0.0 - 0.5 K/uL   Basophils Relative 0 %   Basophils Absolute 0.0 0.0 - 0.1 K/uL   Immature Granulocytes 0 %   Abs Immature Granulocytes 0.03 0.00 - 0.07 K/uL    Comment: Performed at [facility], [address] [place], [place] [address]  Protime-INR     Status: None   Collection Time: 03/11/23  1:26 PM  Result Value Ref Range   Prothrombin Time 14.1 11.4 - 15.2 seconds   INR 1.1 0.8 - 1.2    Comment: (NOTE) INR goal varies based on device and disease states. Performed at [facility], [address], [place], [place] [address]   APTT     Status: None   Collection Time: 03/11/23  1:26 PM  Result Value Ref Range   aPTT 28 24 - 36 seconds    Comment: Performed at [facility], [address], [place], [place] [address]    DG Finger Middle Left  Result Date: 03/11/2023 CLINICAL DATA:  Pain and swelling  EXAM: LEFT MIDDLE FINGER 2+V COMPARISON:  None Available. FINDINGS: No displaced fracture or dislocation is seen. No focal lytic lesions are seen. There is marked soft tissue swelling over the dorsal and lateral aspect of PIP joint. There is 1.6 cm smoothly marginated low-density in the area of soft tissue swelling. There is linear calcification in the medial margin of the neck of the third metacarpal. This may be residual from previous injury. IMPRESSION: No recent fracture or dislocation is seen in left middle finger. No focal lytic lesions are seen. There is marked soft tissue swelling over the PIP joint. There is 1.6 cm area of smooth marginated low-density in the area of soft tissue swelling. This may suggest air trapped in the soft tissues due to infectious process or an open wound in the skin. If there is clinical suspicion for osteomyelitis, follow-up MRI may be considered. Electronically Signed   By: Ernie Avena M.D.   On: 03/11/2023 13:21   DG Chest Port 1 View  Result Date: 03/11/2023 CLINICAL DATA:  Possible sepsis EXAM: PORTABLE CHEST 1 VIEW COMPARISON:  Previous studies including the examination of 11/10/2022 FINDINGS: Transverse diameter of heart is increased. Central pulmonary vessels are prominent without signs of alveolar pulmonary edema. There is no focal pulmonary consolidation. Linear densities are seen in right lower lung field. Costophrenic angles are clear. There is no pneumothorax. Metallic sutures are seen in the sternum. There is interval removal of right IJ dialysis catheter. IMPRESSION: There are no signs of pulmonary edema or focal pulmonary consolidation. Linear densities in right lower lung field may suggest crowding of normal bronchovascular structures or subsegmental atelectasis. Electronically Signed   By: Ernie Avena M.D.   On: 03/11/2023 13:15    Review of Systems  Constitutional:  Positive for chills and fever.  HENT:  Negative for ear discharge, ear pain,  hearing loss and tinnitus.   Eyes:  Negative for photophobia and pain.  Respiratory:  Negative for cough and shortness of breath.   Cardiovascular:  Negative for chest pain.  Gastrointestinal:  Negative for abdominal pain, nausea and vomiting.  Genitourinary:  Negative for dysuria, flank pain, frequency and urgency.  Musculoskeletal:  Positive for arthralgias (Left long finger/hand/FA). Negative for back pain, myalgias and neck pain.  Neurological:  Negative for dizziness and headaches.  Hematological:  Does not bruise/bleed easily.  Psychiatric/Behavioral:  The patient is not nervous/anxious.    Blood pressure (!) 154/92, pulse (!) 105, temperature 99 F (37.2 C), resp. rate 19, height 6\' 4"  (1.93 m), weight 130 kg, SpO2 95 %. Physical Exam Constitutional:      General: He is not in acute distress.    Appearance: He is well-developed. He is not diaphoretic.  HENT:     Head: Normocephalic and atraumatic.  Eyes:     General: No scleral icterus.       Right eye: No discharge.        Left eye: No discharge.     Conjunctiva/sclera: Conjunctivae normal.  Cardiovascular:     Rate and Rhythm: Normal rate and regular rhythm.  Pulmonary:     Effort: Pulmonary effort is normal. No respiratory distress.  Musculoskeletal:     Cervical back: Normal range of motion.     Comments: Left shoulder, elbow, wrist, digits- no skin wounds, mild-mod TTP dorsum of hand to mid-FA, no pain with AROM wrist/fingers, no SQE, no instability, no blocks to motion  Sens  Ax/R/M/U intact  Mot   Ax/ R/ PIN/ M/ AIN/ U intact  Rad 2+  Skin:    General: Skin is warm and dry.  Neurological:     Mental Status: He is alert.  Psychiatric:        Mood and Affect: Mood normal.        Behavior: Behavior normal.     Assessment/Plan: Left hand infection -- Given no pain with movement I think this likely represents an ascending cellulitis over tenosynovitis or fasciitis. Continue medical treatment of  infection.    Freeman Caldron, PA-C Orthopedic Surgery [phone] 03/11/2023, 3:58 PM

## 2023-03-11 NOTE — ED Triage Notes (Signed)
Pt arrives from dialysis with left middle finger swelling and wound. States he noticed it yesterday but has gotten worse quickly.

## 2023-03-11 NOTE — ED Notes (Signed)
Pt complaining of itching all over, and burning at IV site where Vanc is being administered. Page sent to hospitalist, and PA asked to reassess pt. Will continue to monitor.

## 2023-03-11 NOTE — Progress Notes (Signed)
Pt arrived to room 5N11 via [facility] from [facility] ED. Received report from [place], Charity fundraiser. See assessment. Will continue to monitor.

## 2023-03-11 NOTE — Progress Notes (Signed)
Pharmacy Note   A consult was received from an ED physician for vancomycin and cefepime per pharmacy dosing.    The patient's profile has been reviewed for ht/wt/allergies/indication/available labs.    A one time order has been placed for vancomycin 2000 mg IV x1 and cefepime 2 gr IV x1 .    Further antibiotics/pharmacy consults should be ordered by admitting physician if indicated.                       Thank you,  Adalberto Cole, PharmD, BCPS 03/11/2023 1:17 PM

## 2023-03-12 LAB — CBC
HCT: 29.7 % — ABNORMAL LOW (ref 39.0–52.0)
Hemoglobin: 9.6 g/dL — ABNORMAL LOW (ref 13.0–17.0)
MCH: 32.5 pg (ref 26.0–34.0)
MCHC: 32.3 g/dL (ref 30.0–36.0)
MCV: 100.7 fL — ABNORMAL HIGH (ref 80.0–100.0)
Platelets: 172 10*3/uL (ref 150–400)
RBC: 2.95 MIL/uL — ABNORMAL LOW (ref 4.22–5.81)
RDW: 14.4 % (ref 11.5–15.5)
WBC: 7.3 10*3/uL (ref 4.0–10.5)
nRBC: 0 % (ref 0.0–0.2)

## 2023-03-12 LAB — LACTIC ACID, PLASMA: Lactic Acid, Venous: 1.2 mmol/L (ref 0.5–1.9)

## 2023-03-12 LAB — GLUCOSE, CAPILLARY
Glucose-Capillary: 187 mg/dL — ABNORMAL HIGH (ref 70–99)
Glucose-Capillary: 208 mg/dL — ABNORMAL HIGH (ref 70–99)
Glucose-Capillary: 203 mg/dL — ABNORMAL HIGH (ref 70–99)
Glucose-Capillary: 199 mg/dL — ABNORMAL HIGH (ref 70–99)

## 2023-03-12 LAB — BASIC METABOLIC PANEL
Anion gap: 11 (ref 5–15)
BUN: 26 mg/dL — ABNORMAL HIGH (ref 6–20)
CO2: 23 mmol/L (ref 22–32)
Calcium: 8.2 mg/dL — ABNORMAL LOW (ref 8.9–10.3)
Chloride: 100 mmol/L (ref 98–111)
Creatinine, Ser: 5.25 mg/dL — ABNORMAL HIGH (ref 0.61–1.24)
GFR, Estimated: 13 mL/min — ABNORMAL LOW (ref >60)
Glucose, Bld: 222 mg/dL — ABNORMAL HIGH (ref 70–99)
Potassium: 4.7 mmol/L (ref 3.5–5.1)
Sodium: 134 mmol/L — ABNORMAL LOW (ref 135–145)

## 2023-03-12 MED ORDER — LIVING WELL WITH DIABETES BOOK
Freq: Once | Status: AC
  Filled 2023-03-12: qty 1

## 2023-03-12 MED ORDER — SODIUM CHLORIDE 0.9 % IV SOLN: 2.0000 g | INTRAVENOUS | Status: DC

## 2023-03-12 MED ORDER — VANCOMYCIN HCL IN DEXTROSE 1-5 GM/200ML-% IV SOLN
1000.0000 mg | INTRAVENOUS | Status: DC
  Administered 2023-03-13: 1000 mg via INTRAVENOUS
  Filled 2023-03-12: qty 200

## 2023-03-12 MED ORDER — CHLORHEXIDINE GLUCONATE CLOTH 2 % EX PADS
6.0000 | MEDICATED_PAD | Freq: Every day | CUTANEOUS | Status: DC
  Administered 2023-03-13 – 2023-03-14 (×2): 6 via TOPICAL

## 2023-03-12 NOTE — Consult Note (Signed)
[facility] Consult Note: Reason for Consult: To manage dialysis and dialysis related needs Referring Physician: Dr. Marlin Canary  HPI:  Howard Mcmahon is an 52 y.o. male with past medical history significant for hypertension, diabetes, CAD, peripheral vascular disease status post left BKA, ESRD on HD MWF admitted with infection of left middle finger, seen as a consultation for the management of ESRD. The patient presented to worse along the ER initially because of concern of the infection in his hand.  The x-ray without any evidence of osteomyelitis.  He was seen by orthopedics team who did I&D of the wound and now admitted to the hospital for antibiotics.  He is currently on vancomycin and cefepime.  Also received a dose of Flagyl. He is on room air, afebrile today.  Blood pressure acceptable.  The labs showed potassium 4.7, BUN 26, hemoglobin 9.6, WBC 7.3.  He goes to [facility].  I called outpatient HD center today and obtain HD orders as below. Dialyzes at [facility], MWF, 4 hours, EDW 114 kg. HD Bath 2K, 3 calcium, , Heparin 3000 bolus and 500 maintenance. Access aVF.  He had dialysis done yesterday.  Denies nausea, vomiting, chest pain, shortness of breath.  Past Medical History:  Diagnosis Date   Chronic kidney disease    on dialysis   Diabetes mellitus without complication (HCC)    Hypertension    Macular infarction    2012   MI (myocardial infarction) (HCC)    Peripheral vascular disease (HCC)    Neuropathy   Pneumonia     Past Surgical History:  Procedure Laterality Date   CARDIAC SURGERY     CORONARY ANGIOPLASTY WITH STENT PLACEMENT     CORONARY ARTERY BYPASS GRAFT     HIP ARTHROPLASTY Right     Family History  Problem Relation Age of Onset   Stroke Mother     Social History:  reports that he has quit smoking. His smoking use included cigarettes. He has a 60.00 pack-year smoking history. He uses  smokeless tobacco. He reports that he does not drink alcohol and does not use drugs.  Allergies:  Allergies  Allergen Reactions   Lisinopril Swelling   Lactose Intolerance (Gi)     Upset stomach    Other Diarrhea    Lactulose intolerant Lactulose intolerant    Medications: I have reviewed the patient's current medications.   Results for orders placed or performed during the hospital encounter of 03/11/23 (from the past 48 hour(s))  Resp panel by RT-PCR (RSV, Flu A&B, Covid) Anterior Nasal Swab     Status: None   Collection Time: 03/11/23  1:07 PM   Specimen: Anterior Nasal Swab  Result Value Ref Range   SARS Coronavirus 2 by RT PCR NEGATIVE NEGATIVE    Comment: (NOTE) SARS-CoV-2 target nucleic acids are NOT DETECTED.  The SARS-CoV-2 RNA is generally detectable in upper respiratory specimens during the acute phase of infection. The lowest concentration of SARS-CoV-2 viral copies this assay can detect is 138 copies/mL. A negative result does not preclude SARS-Cov-2 infection and should not be used as the sole basis for treatment or other patient management decisions. A negative result may occur with  improper specimen collection/handling, submission of specimen other than nasopharyngeal swab, presence of viral mutation(s) within the areas targeted by this assay, and inadequate number of viral copies(<138 copies/mL). A negative result must be combined with clinical observations, patient history, and epidemiological information. The expected  result is Negative.  Fact Sheet for Patients:  BloggerCourse.com  Fact Sheet for Healthcare Providers:  SeriousBroker.it  This test is no t yet approved or cleared by the [place] FDA and  has been authorized for detection and/or diagnosis of SARS-CoV-2 by FDA under an Emergency Use Authorization (EUA). This EUA will remain  in effect (meaning this test can be used) for the duration  of the COVID-19 declaration under Section 564(b)(1) of the Act, 21 U.S.C.section 360bbb-3(b)(1), unless the authorization is terminated  or revoked sooner.       Influenza A by PCR NEGATIVE NEGATIVE   Influenza B by PCR NEGATIVE NEGATIVE    Comment: (NOTE) The Xpert Xpress SARS-CoV-2/FLU/RSV plus assay is intended as an aid in the diagnosis of influenza from Nasopharyngeal swab specimens and should not be used as a sole basis for treatment. Nasal washings and aspirates are unacceptable for Xpert Xpress SARS-CoV-2/FLU/RSV testing.  Fact Sheet for Patients: BloggerCourse.com  Fact Sheet for Healthcare Providers: SeriousBroker.it  This test is not yet approved or cleared by the [place] FDA and has been authorized for detection and/or diagnosis of SARS-CoV-2 by FDA under an Emergency Use Authorization (EUA). This EUA will remain in effect (meaning this test can be used) for the duration of the COVID-19 declaration under Section 564(b)(1) of the Act, 21 U.S.C. section 360bbb-3(b)(1), unless the authorization is terminated or revoked.     Resp Syncytial Virus by PCR NEGATIVE NEGATIVE    Comment: (NOTE) Fact Sheet for Patients: BloggerCourse.com  Fact Sheet for Healthcare Providers: SeriousBroker.it  This test is not yet approved or cleared by the [place] FDA and has been authorized for detection and/or diagnosis of SARS-CoV-2 by FDA under an Emergency Use Authorization (EUA). This EUA will remain in effect (meaning this test can be used) for the duration of the COVID-19 declaration under Section 564(b)(1) of the Act, 21 U.S.C. section 360bbb-3(b)(1), unless the authorization is terminated or revoked.  Performed at [facility], [address], [place], [place] [address]   Lactic acid, plasma     Status: Abnormal   Collection Time: 03/11/23  1:11  PM  Result Value Ref Range   Lactic Acid, Venous 3.1 (HH) 0.5 - 1.9 mmol/L    Comment: CRITICAL RESULT CALLED TO, READ BACK BY AND VERIFIED WITH RN P DOWD AT 1358 03/11/23 CRUICKSHANK A Performed at [facility], [address], [place], [place] [address]   Comprehensive metabolic panel     Status: Abnormal   Collection Time: 03/11/23  1:15 PM  Result Value Ref Range   Sodium 134 (L) 135 - 145 mmol/L   Potassium 4.2 3.5 - 5.1 mmol/L   Chloride 98 98 - 111 mmol/L   CO2 26 22 - 32 mmol/L   Glucose, Bld 363 (H) 70 - 99 mg/dL    Comment: Glucose reference range applies only to samples taken after fasting for at least 8 hours.   BUN 20 6 - 20 mg/dL   Creatinine, Ser 9.14 (H) 0.61 - 1.24 mg/dL   Calcium 8.9 8.9 - 78.2 mg/dL   Total Protein 7.8 6.5 - 8.1 g/dL   Albumin 3.1 (L) 3.5 - 5.0 g/dL   AST 18 15 - 41 U/L   ALT 12 0 - 44 U/L   Alkaline Phosphatase 85 38 - 126 U/L   Total Bilirubin 0.7 0.3 - 1.2 mg/dL   GFR, Estimated 16 (L) >60 mL/min    Comment: (NOTE) Calculated using the  CKD-EPI Creatinine Equation (2021)    Anion gap 10 5 - 15    Comment: Performed at [facility], [address], [place], [place] [address]  CBC with Differential     Status: Abnormal   Collection Time: 03/11/23  1:15 PM  Result Value Ref Range   WBC 7.4 4.0 - 10.5 K/uL   RBC 3.27 (L) 4.22 - 5.81 MIL/uL   Hemoglobin 10.5 (L) 13.0 - 17.0 g/dL   HCT 60.4 (L) 54.0 - 98.1 %   MCV 100.6 (H) 80.0 - 100.0 fL   MCH 32.1 26.0 - 34.0 pg   MCHC 31.9 30.0 - 36.0 g/dL   RDW 19.1 47.8 - 29.5 %   Platelets 181 150 - 400 K/uL   nRBC 0.0 0.0 - 0.2 %   Neutrophils Relative % 75 %   Neutro Abs 5.5 1.7 - 7.7 K/uL   Lymphocytes Relative 13 %   Lymphs Abs 0.9 0.7 - 4.0 K/uL   Monocytes Relative 9 %   Monocytes Absolute 0.7 0.1 - 1.0 K/uL   Eosinophils Relative 3 %   Eosinophils Absolute 0.3 0.0 - 0.5 K/uL   Basophils Relative 0 %   Basophils Absolute 0.0 0.0 - 0.1 K/uL   Immature  Granulocytes 0 %   Abs Immature Granulocytes 0.03 0.00 - 0.07 K/uL    Comment: Performed at [facility], [address] [place], [place] [address]  Protime-INR     Status: None   Collection Time: 03/11/23  1:26 PM  Result Value Ref Range   Prothrombin Time 14.1 11.4 - 15.2 seconds   INR 1.1 0.8 - 1.2    Comment: (NOTE) INR goal varies based on device and disease states. Performed at [facility], [address], [place], [place] [address]   APTT     Status: None   Collection Time: 03/11/23  1:26 PM  Result Value Ref Range   aPTT 28 24 - 36 seconds    Comment: Performed at [facility], [address] [place] [address], [place], [place] [address]  Aerobic Culture w Gram Stain (superficial specimen)     Status: None (Preliminary result)   Collection Time: 03/11/23  3:34 PM   Specimen: Joint, Finger; Wound  Result Value Ref Range   Specimen Description      FINGER Performed at [facility], [address], [place], [place] [address]    Special Requests      Immunocompromised Performed at [facility], [address] [place], [place] [address]    Gram Stain NO WBC SEEN FEW GRAM POSITIVE COCCI IN PAIRS     Culture      CULTURE REINCUBATED FOR BETTER GROWTH Performed at [facility], [address] [place], [place] [address]    Report Status PENDING   Lactic acid, plasma     Status: Abnormal   Collection Time: 03/11/23  3:54 PM  Result Value Ref Range   Lactic Acid, Venous 2.1 (HH) 0.5 - 1.9 mmol/L    Comment: CRITICAL VALUE NOTED. VALUE IS CONSISTENT WITH PREVIOUSLY REPORTED/CALLED VALUE Performed at [facility], [address] [place], [place] [address]   Glucose, capillary     Status: Abnormal   Collection Time: 03/11/23  5:14 PM  Result Value Ref Range   Glucose-Capillary 238 (H) 70 - 99 mg/dL    Comment: Glucose reference range applies only to samples taken after  fasting for at least  8 hours.  Hemoglobin A1c     Status: Abnormal   Collection Time: 03/11/23  6:19 PM  Result Value Ref Range   Hgb A1c MFr Bld 9.3 (H) 4.8 - 5.6 %    Comment: (NOTE) Pre diabetes:          5.7%-6.4%  Diabetes:              >6.4%  Glycemic control for   <7.0% adults with diabetes    Mean Plasma Glucose 220.21 mg/dL    Comment: Performed at [facility], [address] [place], [place] [address]  HIV Antibody (routine testing w rflx)     Status: None   Collection Time: 03/11/23  6:19 PM  Result Value Ref Range   HIV Screen 4th Generation wRfx Non Reactive Non Reactive    Comment: Performed at [facility], [address] [place], [place] [address]  Glucose, capillary     Status: Abnormal   Collection Time: 03/11/23  8:42 PM  Result Value Ref Range   Glucose-Capillary 166 (H) 70 - 99 mg/dL    Comment: Glucose reference range applies only to samples taken after fasting for at least 8 hours.  Lactic acid, plasma     Status: None   Collection Time: 03/12/23  2:10 AM  Result Value Ref Range   Lactic Acid, Venous 1.2 0.5 - 1.9 mmol/L    Comment: Performed at [facility], [address] [place], [place] [address]  Basic metabolic panel     Status: Abnormal   Collection Time: 03/12/23  2:10 AM  Result Value Ref Range   Sodium 134 (L) 135 - 145 mmol/L   Potassium 4.7 3.5 - 5.1 mmol/L   Chloride 100 98 - 111 mmol/L   CO2 23 22 - 32 mmol/L   Glucose, Bld 222 (H) 70 - 99 mg/dL    Comment: Glucose reference range applies only to samples taken after fasting for at least 8 hours.   BUN 26 (H) 6 - 20 mg/dL   Creatinine, Ser 9.14 (H) 0.61 - 1.24 mg/dL   Calcium 8.2 (L) 8.9 - 10.3 mg/dL   GFR, Estimated 13 (L) >60 mL/min    Comment: (NOTE) Calculated using the CKD-EPI Creatinine Equation (2021)    Anion gap 11 5 - 15    Comment: Performed at [facility], [address], [place], [place] [address]  CBC     Status: Abnormal   Collection Time:  03/12/23  2:10 AM  Result Value Ref Range   WBC 7.3 4.0 - 10.5 K/uL   RBC 2.95 (L) 4.22 - 5.81 MIL/uL   Hemoglobin 9.6 (L) 13.0 - 17.0 g/dL   HCT 62.1 (L) 30.8 - 65.7 %   MCV 100.7 (H) 80.0 - 100.0 fL   MCH 32.5 26.0 - 34.0 pg   MCHC 32.3 30.0 - 36.0 g/dL   RDW 84.6 96.2 - 95.2 %   Platelets 172 150 - 400 K/uL   nRBC 0.0 0.0 - 0.2 %    Comment: Performed at [facility], [address], [place], [place] [address]  Glucose, capillary     Status: Abnormal   Collection Time: 03/12/23  7:58 AM  Result Value Ref Range   Glucose-Capillary 187 (H) 70 - 99 mg/dL    Comment: Glucose reference range applies only to samples taken after fasting for at least 8 hours.    DG Finger Middle Left  Result Date:  03/11/2023 CLINICAL DATA:  Pain and swelling EXAM: LEFT MIDDLE FINGER 2+V COMPARISON:  None Available. FINDINGS: No displaced fracture or dislocation is seen. No focal lytic lesions are seen. There is marked soft tissue swelling over the dorsal and lateral aspect of PIP joint. There is 1.6 cm smoothly marginated low-density in the area of soft tissue swelling. There is linear calcification in the medial margin of the neck of the third metacarpal. This may be residual from previous injury. IMPRESSION: No recent fracture or dislocation is seen in left middle finger. No focal lytic lesions are seen. There is marked soft tissue swelling over the PIP joint. There is 1.6 cm area of smooth marginated low-density in the area of soft tissue swelling. This may suggest air trapped in the soft tissues due to infectious process or an open wound in the skin. If there is clinical suspicion for osteomyelitis, follow-up MRI may be considered. Electronically Signed   By: Ernie Avena M.D.   On: 03/11/2023 13:21   DG Chest Port 1 View  Result Date: 03/11/2023 CLINICAL DATA:  Possible sepsis EXAM: PORTABLE CHEST 1 VIEW COMPARISON:  Previous studies including the examination of 11/10/2022 FINDINGS: Transverse  diameter of heart is increased. Central pulmonary vessels are prominent without signs of alveolar pulmonary edema. There is no focal pulmonary consolidation. Linear densities are seen in right lower lung field. Costophrenic angles are clear. There is no pneumothorax. Metallic sutures are seen in the sternum. There is interval removal of right IJ dialysis catheter. IMPRESSION: There are no signs of pulmonary edema or focal pulmonary consolidation. Linear densities in right lower lung field may suggest crowding of normal bronchovascular structures or subsegmental atelectasis. Electronically Signed   By: Ernie Avena M.D.   On: 03/11/2023 13:15    ROS: As per H&P, rest of the review of system reviewed and negative. Blood pressure 126/76, pulse 84, temperature 99.5 F (37.5 C), temperature source Oral, resp. rate (!) 22, height 6\' 4"  (1.93 m), weight 130 kg, SpO2 100 %. Gen: NAD, comfortable Respiratory: Clear bilateral, no wheezing or crackle Cardiovascular: Regular rate rhythm S1-S2 normal, no rubs GI: Abdomen soft, nontender, nondistended Extremities, left BKA, left middle finger has dressing on. Skin: No rash or ulcer Neurology: Alert, awake, following commands, oriented Dialysis Access: Left AVF with good thrill and bruit.  Assessment/Plan:  # Left hand infection: Seen by orthopedics concerning for ascending cellulitis over tenosynovitis or fasciitis.  Status post I&D on 7/1.  Currently on broad-spectrum antibiotics per primary team.  # ESRD MWF at [facility]: I called the outpatient HD center today and obtained HD orders.  We will plan for dialysis tomorrow.  # Hypertension: Blood pressure acceptable.  UF with HD.  On carvedilol.  # Anemia of ESRD: Hemoglobin 10.5 on admission, slight drop today.  Monitor.  # Metabolic Bone Disease: Check phosphorus level.  Monitor lab.  Thank you for the consult.  We will follow.  Keundra Petrucelli Jaynie Collins 03/12/2023, 11:44 AM

## 2023-03-12 NOTE — Inpatient Diabetes Management (Signed)
Inpatient Diabetes Program Recommendations  AACE/ADA: New Consensus Statement on Inpatient Glycemic Control (2015)  Target Ranges:  Prepandial:   less than 140 mg/dL      Peak postprandial:   less than 180 mg/dL (1-2 hours)      Critically ill patients:  140 - 180 mg/dL   Lab Results  Component Value Date   GLUCAP 208 (H) 03/12/2023   HGBA1C 9.3 (H) 03/11/2023    Review of Glycemic Control  Latest Reference Range & Units 01/20/22 07:12 03/11/23 17:14 03/11/23 20:42 03/12/23 07:58 03/12/23 11:45  Glucose-Capillary 70 - 99 mg/dL 161 (H) 096 (H) 045 (H) 187 (H) 208 (H)  (H): Data is abnormally high  Diabetes history: DM2 Outpatient Diabetes medications: 70/30 36 units BID, Ozempic (not taking) Current orders for Inpatient glycemic control: 70/30 18 units BID, Novolog 0-9 units TID and 0-5 units QHS  Inpatient Diabetes Program Recommendations:    70/30 20 BID  Spoke with patient at bedside.  Reviewed patient's current A1c of 9.3% (average BG of 220 mg/dL). Explained what a A1c is and what it measures. Also reviewed goal A1c with patient, importance of good glucose control @ home, and blood sugar goals.  He states his blood sugars are around 160 mg/dL when he checks.  He admits to drinking regular [facility].  Encouraged him to switch to diet.  He verbalizes understanding.  Educated on The Plate Method, CHO's, portion control, CBGs at home fasting and mid afternoon, F/U with PCP every 3 months, bring meter to PCP office, long and short term complications of uncontrolled BG, and importance of exercise.  Will continue to follow while inpatient.  Thank you, Dulce Sellar, MSN, CDCES Diabetes Coordinator Inpatient Diabetes Program [phone] (team pager from 8a-5p)

## 2023-03-12 NOTE — Progress Notes (Signed)
PROGRESS NOTE    Howard Mcmahon  YQM:[id number] DOB: 08-06-1971 DOA: 03/11/2023 PCP: [facility]    Brief Narrative:  Howard Mcmahon is a 52 y.o. male with PMH significant for ESRD-HD-MWF, DM2, HTN, HLD, CAD/MI, ischemic cardiomyopathy, chronic combined CHF, PAD s/p left BKA, GERD, chronic anemia, chronic anxiety/depression Patient presented to [facility] ED today with concern of infection in his hand.  He noticed pain and swelling of his left middle finger 2 days ago that radiated towards the forearm.  S/P excision of desquamated epidermis.     Assessment and Plan: Sepsis POA Left middle finger cellulitis and abscess  Presented with worsening pain, fever, lactic acidosis X-ray without evidence of osteomyelitis Drained in the ED Blood culture and wound culture sent and are pending Started on broad-spectrum antibiotics -seen by ortho: S/P excision of desquamated epidermis.    ESRD-HD-MWF Last dialysis today prior to presentation.  No need of urgent dialysis.  Next routine dialysis on Wednesday -renal consulted   Type 2 diabetes mellitus with hyperglycemia A1c 6 in 2023.  Repeat A1c Blood sugar level was elevated to 360s in the ED likely because of acute infection. -resume home meds at lower dose and adjust as needed  CAD/MI PAD s/p left BKA HLD  -aspirin 81 mg daily, Lipitor 80 mg daily   ischemic cardiomyopathy chronic combined CHF HTN -Coreg, indur   Obesity Estimated body mass index is 34.89 kg/m as calculated from the following:   Height as of this encounter: 6\' 4"  (1.93 m).   Weight as of this encounter: 130 kg.    DVT prophylaxis: heparin injection 5,000 Units Start: 03/11/23 2200    Code Status: Full Code   Disposition Plan:  Level of care: Telemetry Medical Status is: Inpatient Remains inpatient appropriate     Consultants:  Ortho renal   Subjective: Pain improved in arm-- not extending up as far as yesterday  Objective: Vitals:   03/11/23 1711  03/11/23 2040 03/12/23 0447 03/12/23 0700  BP: (!) 145/85 (!) 141/73 (!) 143/80 126/76  Pulse: 99 94 92 84  Resp:  17 16 (!) 22  Temp: 98.8 F (37.1 C) 99.1 F (37.3 C) 99 F (37.2 C) 99.5 F (37.5 C)  TempSrc: Oral Oral Oral Oral  SpO2: 97% 97% 97% 100%  Weight:      Height:        Intake/Output Summary (Last 24 hours) at 03/12/2023 1009 Last data filed at 03/12/2023 0549 Gross per 24 hour  Intake 3033.86 ml  Output 750 ml  Net 2283.86 ml   Filed Weights   03/11/23 1207  Weight: 130 kg    Examination:   General: Appearance:    Obese male in no acute distress     Lungs:     respirations unlabored  Heart:    Normal heart rate.     MS:   All extremities are intact.    Neurologic:   Awake, alert       Data Reviewed: I have personally reviewed following labs and imaging studies  CBC: Recent Labs  Lab 03/11/23 1315 03/12/23 0210  WBC 7.4 7.3  NEUTROABS 5.5  --   HGB 10.5* 9.6*  HCT 32.9* 29.7*  MCV 100.6* 100.7*  PLT 181 172   Basic Metabolic Panel: Recent Labs  Lab 03/11/23 1315 03/12/23 0210  NA 134* 134*  K 4.2 4.7  CL 98 100  CO2 26 23  GLUCOSE 363* 222*  BUN 20 26*  CREATININE 4.22* 5.25*  CALCIUM  8.9 8.2*   GFR: Estimated Creatinine Clearance: 24.8 mL/min (A) (by C-G formula based on SCr of 5.25 mg/dL (H)). Liver Function Tests: Recent Labs  Lab 03/11/23 1315  AST 18  ALT 12  ALKPHOS 85  BILITOT 0.7  PROT 7.8  ALBUMIN 3.1*   No results for input(s): "LIPASE", "AMYLASE" in the last 168 hours. No results for input(s): "AMMONIA" in the last 168 hours. Coagulation Profile: Recent Labs  Lab 03/11/23 1326  INR 1.1   Cardiac Enzymes: No results for input(s): "CKTOTAL", "CKMB", "CKMBINDEX", "TROPONINI" in the last 168 hours. BNP (last 3 results) No results for input(s): "PROBNP" in the last 8760 hours. HbA1C: Recent Labs    03/11/23 1819  HGBA1C 9.3*   CBG: Recent Labs  Lab 03/11/23 1714 03/11/23 2042 03/12/23 0758   GLUCAP 238* 166* 187*   Lipid Profile: No results for input(s): "CHOL", "HDL", "LDLCALC", "TRIG", "CHOLHDL", "LDLDIRECT" in the last 72 hours. Thyroid Function Tests: No results for input(s): "TSH", "T4TOTAL", "FREET4", "T3FREE", "THYROIDAB" in the last 72 hours. Anemia Panel: No results for input(s): "VITAMINB12", "FOLATE", "FERRITIN", "TIBC", "IRON", "RETICCTPCT" in the last 72 hours. Sepsis Labs: Recent Labs  Lab 03/11/23 1311 03/11/23 1554 03/12/23 0210  LATICACIDVEN 3.1* 2.1* 1.2    Recent Results (from the past 240 hour(s))  Resp panel by RT-PCR (RSV, Flu A&B, Covid) Anterior Nasal Swab     Status: None   Collection Time: 03/11/23  1:07 PM   Specimen: Anterior Nasal Swab  Result Value Ref Range Status   SARS Coronavirus 2 by RT PCR NEGATIVE NEGATIVE Final    Comment: (NOTE) SARS-CoV-2 target nucleic acids are NOT DETECTED.  The SARS-CoV-2 RNA is generally detectable in upper respiratory specimens during the acute phase of infection. The lowest concentration of SARS-CoV-2 viral copies this assay can detect is 138 copies/mL. A negative result does not preclude SARS-Cov-2 infection and should not be used as the sole basis for treatment or other patient management decisions. A negative result may occur with  improper specimen collection/handling, submission of specimen other than nasopharyngeal swab, presence of viral mutation(s) within the areas targeted by this assay, and inadequate number of viral copies(<138 copies/mL). A negative result must be combined with clinical observations, patient history, and epidemiological information. The expected result is Negative.  Fact Sheet for Patients:  BloggerCourse.com  Fact Sheet for Healthcare Providers:  SeriousBroker.it  This test is no t yet approved or cleared by the [place] FDA and  has been authorized for detection and/or diagnosis of SARS-CoV-2 by FDA under an  Emergency Use Authorization (EUA). This EUA will remain  in effect (meaning this test can be used) for the duration of the COVID-19 declaration under Section 564(b)(1) of the Act, 21 U.S.C.section 360bbb-3(b)(1), unless the authorization is terminated  or revoked sooner.       Influenza A by PCR NEGATIVE NEGATIVE Final   Influenza B by PCR NEGATIVE NEGATIVE Final    Comment: (NOTE) The Xpert Xpress SARS-CoV-2/FLU/RSV plus assay is intended as an aid in the diagnosis of influenza from Nasopharyngeal swab specimens and should not be used as a sole basis for treatment. Nasal washings and aspirates are unacceptable for Xpert Xpress SARS-CoV-2/FLU/RSV testing.  Fact Sheet for Patients: BloggerCourse.com  Fact Sheet for Healthcare Providers: SeriousBroker.it  This test is not yet approved or cleared by the [place] FDA and has been authorized for detection and/or diagnosis of SARS-CoV-2 by FDA under an Emergency Use Authorization (EUA). This EUA will remain in  effect (meaning this test can be used) for the duration of the COVID-19 declaration under Section 564(b)(1) of the Act, 21 U.S.C. section 360bbb-3(b)(1), unless the authorization is terminated or revoked.     Resp Syncytial Virus by PCR NEGATIVE NEGATIVE Final    Comment: (NOTE) Fact Sheet for Patients: BloggerCourse.com  Fact Sheet for Healthcare Providers: SeriousBroker.it  This test is not yet approved or cleared by the [place] FDA and has been authorized for detection and/or diagnosis of SARS-CoV-2 by FDA under an Emergency Use Authorization (EUA). This EUA will remain in effect (meaning this test can be used) for the duration of the COVID-19 declaration under Section 564(b)(1) of the Act, 21 U.S.C. section 360bbb-3(b)(1), unless the authorization is terminated or revoked.  Performed at [facility], [address] [place], [place] [address]   Aerobic Culture w Gram Stain (superficial specimen)     Status: None (Preliminary result)   Collection Time: 03/11/23  3:34 PM   Specimen: Joint, Finger; Wound  Result Value Ref Range Status   Specimen Description   Final    FINGER Performed at [facility], [address] [place], [place] [address]    Special Requests   Final    Immunocompromised Performed at [facility], [address], [place], [place] [address]    Gram Stain NO WBC SEEN FEW GRAM POSITIVE COCCI IN PAIRS   Final   Culture   Final    CULTURE REINCUBATED FOR BETTER GROWTH Performed at [facility], [address], [place], [place] [address]    Report Status PENDING  Incomplete         Radiology Studies: DG Finger Middle Left  Result Date: 03/11/2023 CLINICAL DATA:  Pain and swelling EXAM: LEFT MIDDLE FINGER 2+V COMPARISON:  None Available. FINDINGS: No displaced fracture or dislocation is seen. No focal lytic lesions are seen. There is marked soft tissue swelling over the dorsal and lateral aspect of PIP joint. There is 1.6 cm smoothly marginated low-density in the area of soft tissue swelling. There is linear calcification in the medial margin of the neck of the third metacarpal. This may be residual from previous injury. IMPRESSION: No recent fracture or dislocation is seen in left middle finger. No focal lytic lesions are seen. There is marked soft tissue swelling over the PIP joint. There is 1.6 cm area of smooth marginated low-density in the area of soft tissue swelling. This may suggest air trapped in the soft tissues due to infectious process or an open wound in the skin. If there is clinical suspicion for osteomyelitis, follow-up MRI may be considered. Electronically Signed   By: Ernie Avena M.D.   On: 03/11/2023 13:21   DG Chest Port 1 View  Result Date: 03/11/2023 CLINICAL DATA:  Possible sepsis  EXAM: PORTABLE CHEST 1 VIEW COMPARISON:  Previous studies including the examination of 11/10/2022 FINDINGS: Transverse diameter of heart is increased. Central pulmonary vessels are prominent without signs of alveolar pulmonary edema. There is no focal pulmonary consolidation. Linear densities are seen in right lower lung field. Costophrenic angles are clear. There is no pneumothorax. Metallic sutures are seen in the sternum. There is interval removal of right IJ dialysis catheter. IMPRESSION: There are no signs of pulmonary edema or focal pulmonary consolidation. Linear densities in right lower lung field may suggest crowding of normal bronchovascular structures or subsegmental atelectasis. Electronically Signed   By: Ernie Avena M.D.   On: 03/11/2023 13:15  Scheduled Meds:  aspirin EC  81 mg Oral QHS   atorvastatin  80 mg Oral Daily   carvedilol  25 mg Oral BID   feeding supplement (NEPRO CARB STEADY)  237 mL Oral BID BM   heparin  5,000 Units Subcutaneous Q8H   insulin aspart  0-5 Units Subcutaneous QHS   insulin aspart  0-9 Units Subcutaneous TID WC   insulin aspart protamine- aspart  18 Units Subcutaneous BID WC   isosorbide mononitrate  60 mg Oral Daily   living well with diabetes book   Does not apply Once   Continuous Infusions:  [START ON 03/13/2023] cefTRIAXone (ROCEPHIN)  IV     [START ON 03/13/2023] vancomycin       LOS: 1 day    Time spent: 45 minutes spent on chart review, discussion with nursing staff, consultants, updating family and interview/physical exam; more than 50% of that time was spent in counseling and/or coordination of care.    Joseph Art, DO [facility] Available via Epic secure chat 7am-7pm After these hours, please refer to coverage provider listed on [email or web address] 03/12/2023, 10:09 AM

## 2023-03-12 NOTE — Progress Notes (Addendum)
Pt receives out-pt HD at [facility] in [facility] on MWF. Pt arrives at 5:40 am for 6:00 am chair time. Will notify clinic of pt's d/c date once confirmed. Contacted attending to inquire about possible d/c date in an attempt to avoid HD on day of d/c if possible. Will assist as needed.   Olivia Canter Renal Navigator [phone]  Addendum at 4:38 pm: Advised by attending that it is unknown when pt may d/c due to awaiting cultures.

## 2023-03-12 NOTE — Plan of Care (Signed)
  Problem: Coping: Goal: Ability to adjust to condition or change in health will improve Outcome: Progressing   Problem: Nutritional: Goal: Maintenance of adequate nutrition will improve Outcome: Progressing   Problem: Skin Integrity: Goal: Risk for impaired skin integrity will decrease Outcome: Progressing   Problem: Education: Goal: Knowledge of General Education information will improve Description: Including pain rating scale, medication(s)/side effects and non-pharmacologic comfort measures Outcome: Progressing   Problem: Safety: Goal: Ability to remain free from injury will improve Outcome: Progressing   Problem: Skin Integrity: Goal: Risk for impaired skin integrity will decrease Outcome: Progressing

## 2023-03-12 NOTE — Progress Notes (Addendum)
Pharmacy Antibiotic Note  Howard Mcmahon is a 52 y.o. male with PMH of ESRD on HD, DM2, HTN, HLD, CAD/MI, ischemic cardiomyopathy, PAD s/p left BKA admitted on 03/11/2023 with left middle finger cellulitis and abscess. Pharmacy has been consulted for Vancomycin and Cefepime dosing. First doses of antibiotics given in the ED. Of note, patient developed itchiness and hives after starting Vancomycin. EDP assessed and recommended slowing down infusion rate and dose of Benadryl also given. Discussed with hospitalist, Dr. Pola Corn, who suspects this was Red man syndrome and is okay to continue with Vancomycin as ordered.  Ok to change cefepime to ceftriaxone per Dr. Benjamine Mola for finger infection. Will start tomorrow since pt got cefepime yesterday. HD schedule MWF  Addendum  Finger is growing staph aureus. D/w Dr. Benjamine Mola, dc ceftriaxone and cont vanc.   Plan: Vancomycin 1g IV qHD Monitor cultures, clinical course  Height: 6\' 4"  (193 cm) Weight: 130 kg (286 lb 9.6 oz) IBW/kg (Calculated) : 86.8  Temp (24hrs), Avg:99.4 F (37.4 C), Min:98.8 F (37.1 C), Max:100.9 F (38.3 C)  Recent Labs  Lab 03/11/23 1311 03/11/23 1315 03/11/23 1554 03/12/23 0210  WBC  --  7.4  --  7.3  CREATININE  --  4.22*  --  5.25*  LATICACIDVEN 3.1*  --  2.1* 1.2     Estimated Creatinine Clearance: 24.8 mL/min (A) (by C-G formula based on SCr of 5.25 mg/dL (H)).    Allergies  Allergen Reactions   Lisinopril Swelling   Lactose Intolerance (Gi)     Upset stomach    Other Diarrhea    Lactulose intolerant Lactulose intolerant    Antimicrobials this admission: 7/1 Vancomycin >> 7/1 Cefepime x1 7/1 Metronidazole x 1  Microbiology results: 7/1 BCx:  7/1 left finger wound (superficial specimen): staph aureus  Ulyses Southward, PharmD, BCIDP, AAHIVP, CPP Infectious Disease Pharmacist 03/12/2023 9:47 AM

## 2023-03-13 DIAGNOSIS — L02512 Cutaneous abscess of left hand: Secondary | ICD-10-CM

## 2023-03-13 DIAGNOSIS — N186 End stage renal disease: Secondary | ICD-10-CM

## 2023-03-13 DIAGNOSIS — D638 Anemia in other chronic diseases classified elsewhere: Secondary | ICD-10-CM

## 2023-03-13 LAB — CBC
HCT: 29.5 % — ABNORMAL LOW (ref 39.0–52.0)
Hemoglobin: 9.4 g/dL — ABNORMAL LOW (ref 13.0–17.0)
MCH: 31.9 pg (ref 26.0–34.0)
MCHC: 31.9 g/dL (ref 30.0–36.0)
MCV: 100 fL (ref 80.0–100.0)
Platelets: 184 10*3/uL (ref 150–400)
RBC: 2.95 MIL/uL — ABNORMAL LOW (ref 4.22–5.81)
RDW: 14.2 % (ref 11.5–15.5)
WBC: 7.4 10*3/uL (ref 4.0–10.5)
nRBC: 0 % (ref 0.0–0.2)

## 2023-03-13 LAB — HEPATITIS B SURFACE ANTIGEN: Hepatitis B Surface Ag: REACTIVE — AB

## 2023-03-13 LAB — RENAL FUNCTION PANEL
Albumin: 2.6 g/dL — ABNORMAL LOW (ref 3.5–5.0)
Anion gap: 15 (ref 5–15)
BUN: 38 mg/dL — ABNORMAL HIGH (ref 6–20)
CO2: 22 mmol/L (ref 22–32)
Calcium: 8.5 mg/dL — ABNORMAL LOW (ref 8.9–10.3)
Chloride: 100 mmol/L (ref 98–111)
Creatinine, Ser: 6.67 mg/dL — ABNORMAL HIGH (ref 0.61–1.24)
GFR, Estimated: 9 mL/min — ABNORMAL LOW (ref >60)
Glucose, Bld: 161 mg/dL — ABNORMAL HIGH (ref 70–99)
Phosphorus: 4.3 mg/dL (ref 2.5–4.6)
Potassium: 4.8 mmol/L (ref 3.5–5.1)
Sodium: 137 mmol/L (ref 135–145)

## 2023-03-13 LAB — GLUCOSE, CAPILLARY
Glucose-Capillary: 160 mg/dL — ABNORMAL HIGH (ref 70–99)
Glucose-Capillary: 155 mg/dL — ABNORMAL HIGH (ref 70–99)
Glucose-Capillary: 234 mg/dL — ABNORMAL HIGH (ref 70–99)
Glucose-Capillary: 233 mg/dL — ABNORMAL HIGH (ref 70–99)

## 2023-03-13 LAB — HEPATITIS B SURFACE ANTIBODY, QUANTITATIVE: Hep B S AB Quant (Post): <3.5 m[IU]/mL — ABNORMAL LOW

## 2023-03-13 MED ORDER — SODIUM CHLORIDE 0.9 % IV BOLUS
250.0000 mL | Freq: Once | INTRAVENOUS | Status: AC
  Administered 2023-03-13: 250 mL via INTRAVENOUS

## 2023-03-13 MED ORDER — ALTEPLASE 2 MG IJ SOLR: 2.0000 mg | Freq: Once | INTRAMUSCULAR | Status: DC | PRN

## 2023-03-13 MED ORDER — HEPARIN SODIUM (PORCINE) 1000 UNIT/ML IJ SOLN
INTRAMUSCULAR | Status: AC
  Administered 2023-03-13: 3000 [IU] via INTRAVENOUS_CENTRAL
  Filled 2023-03-13: qty 4

## 2023-03-13 MED ORDER — ANTICOAGULANT SODIUM CITRATE 4% (200MG/5ML) IV SOLN: 5.0000 mL | Status: DC | PRN

## 2023-03-13 MED ORDER — LIDOCAINE HCL (PF) 1 % IJ SOLN: 5.0000 mL | INTRAMUSCULAR | Status: DC | PRN

## 2023-03-13 MED ORDER — SODIUM CHLORIDE 0.9 % IV BOLUS
500.0000 mL | Freq: Once | INTRAVENOUS | Status: AC
  Administered 2023-03-13: 500 mL via INTRAVENOUS

## 2023-03-13 MED ORDER — LIDOCAINE-PRILOCAINE 2.5-2.5 % EX CREA: 1.0000 | TOPICAL_CREAM | CUTANEOUS | Status: DC | PRN

## 2023-03-13 MED ORDER — HEPARIN SODIUM (PORCINE) 1000 UNIT/ML DIALYSIS: 1000.0000 [IU] | INTRAMUSCULAR | Status: DC | PRN

## 2023-03-13 MED ORDER — HEPARIN SODIUM (PORCINE) 1000 UNIT/ML DIALYSIS: 3000.0000 [IU] | Freq: Once | INTRAMUSCULAR | Status: AC

## 2023-03-13 MED ORDER — PENTAFLUOROPROP-TETRAFLUOROETH EX AERO: 1.0000 | INHALATION_SPRAY | CUTANEOUS | Status: DC | PRN

## 2023-03-13 NOTE — Progress Notes (Signed)
   03/13/23 1234  Vitals  Temp 98 F (36.7 C)  Pulse Rate 81  Resp (!) 22  BP 109/76  SpO2 100 %  Post Treatment  Dialyzer Clearance Clear  Duration of HD Treatment -hour(s) 4 hour(s)  Hemodialysis Intake (mL) 0 mL  Liters Processed 85  Fluid Removed (mL) 2800 mL  Tolerated HD Treatment Yes  AVG/AVF Arterial Site Held (minutes) 6 minutes  AVG/AVF Venous Site Held (minutes) 6 minutes   Received patient in bed to unit.  Alert and oriented.  Informed consent signed and in chart.   TX duration:4 hrs  Patient tolerated well.  Transported back to the room  Alert, without acute distress.  Hand-off given to patient's nurse.   Access used: LUA AVF Access issues: none  Total UF removed: 2.8L Medication(s) given: none    [facility]

## 2023-03-13 NOTE — Progress Notes (Signed)
Finger wound without purulent drainage, seems to be more an issue with healing of wound than infection.  The exposed dermis is evolving and maturing.  Digits all rest in flexed posture at baseline, likely due to peripheral neuropathy  Recs: Dry dressing changes to finger daily and as needed until no longer any drainage F/u in my office in 2 weeks for wound check   Neil Crouch, MD Hand Surgery

## 2023-03-13 NOTE — Plan of Care (Signed)
  Problem: Health Behavior/Discharge Planning: Goal: Ability to manage health-related needs will improve Outcome: Progressing   Problem: Skin Integrity: Goal: Risk for impaired skin integrity will decrease Outcome: Progressing   Problem: Education: Goal: Knowledge of General Education information will improve Description: Including pain rating scale, medication(s)/side effects and non-pharmacologic comfort measures Outcome: Progressing   

## 2023-03-13 NOTE — Procedures (Signed)
Patient was seen on dialysis and the procedure was supervised.  BFR 400  Via AVF BP is  141/82.  Hep B positive d/w patients. He is getting HD in isolation room. I called his OP HD center ([facility]). They are aware of hep B positive and it is chronic.    Patient appears to be tolerating treatment well.  Alanda Colton Jaynie Collins 03/13/2023

## 2023-03-13 NOTE — Progress Notes (Signed)
PROGRESS NOTE    Howard Mcmahon  ZOX:[id number] DOB: 07/28/1971 DOA: 03/11/2023 PCP: [facility]    Brief Narrative:  Howard Mcmahon is a 52 y.o. male with PMH significant for ESRD-HD-MWF, DM2, HTN, HLD, CAD/MI, ischemic cardiomyopathy, chronic combined CHF, PAD s/p left BKA, GERD, chronic anemia, chronic anxiety/depression Patient presented to [facility] ED with concern of infection in his hand.  He noticed pain and swelling of his left middle finger that radiated towards the forearm.  S/P excision of desquamated epidermis.     Assessment and Plan:  Sepsis POA Left middle finger cellulitis and abscess  Presented with worsening pain, fever, lactic acidosis X-ray without evidence of osteomyelitis Drained in the ED. Seen by hand surgery.  Underwent excision of desquamated epidermis.  No further interventions per hand surgery.  Outpatient follow-up. Started on broad-spectrum antibiotics.  Noted to be on vancomycin currently. Blood cultures negative so far. Anticipate transition to oral antibiotics tomorrow.  ESRD-HD-MWF Nephrology following.  To be dialyzed today.  Chronic hepatitis B Patient was not aware of this.  He was asked to follow-up with his outpatient providers for further management.   Type 2 diabetes mellitus with hyperglycemia HbA1c 9.3.  Monitor CBGs.  Noted to be on SSI and 70/30 insulin.  CAD/MI PAD s/p left BKA HLD Stable   Ischemic cardiomyopathy chronic combined CHF Essential hypertension  Stable.  Continue home medications.  Normocytic anemia Likely anemia of chronic disease.  No evidence for overt bleeding.   Obesity Estimated body mass index is 31.64 kg/m as calculated from the following:   Height as of this encounter: 6\' 4"  (1.93 m).   Weight as of this encounter: 117.9 kg.    DVT prophylaxis: heparin injection 5,000 Units Start: 03/11/23 2200   Code Status: Full Code Communication: Discussed with patient. Disposition Plan: Anticipate discharge  home tomorrow    Consultants:  Ortho renal   Subjective: Patient mentions that his middle finger in the left hand seems to be getting better slowly.  Upset about his hepatitis B infection which is chronic but he was not aware of this.  Objective: Vitals:   03/13/23 1002 03/13/23 1031 03/13/23 1102 03/13/23 1137  BP: 133/88 133/77 124/80 102/77  Pulse: 68 66 69 71  Resp: (!) 23 13 (!) 22 14  Temp:      TempSrc:      SpO2: 95% 99% 99% 94%  Weight:      Height:       No intake or output data in the 24 hours ending 03/13/23 1140  Filed Weights   03/11/23 1207 03/13/23 0810  Weight: 130 kg 117.9 kg    Examination:  General appearance: Awake alert.  In no distress Resp: Clear to auscultation bilaterally.  Normal effort Cardio: S1-S2 is normal regular.  No S3-S4.  No rubs murmurs or bruit GI: Abdomen is soft.  Nontender nondistended.  Bowel sounds are present normal.  No masses organomegaly Extremities: No drainage from the left middle finger.  Open wound noted. Neurologic: Alert and oriented x3.  No focal neurological deficits.    Data Reviewed: I have personally reviewed following labs and imaging studies  CBC: Recent Labs  Lab 03/11/23 1315 03/12/23 0210 03/13/23 0828  WBC 7.4 7.3 7.4  NEUTROABS 5.5  --   --   HGB 10.5* 9.6* 9.4*  HCT 32.9* 29.7* 29.5*  MCV 100.6* 100.7* 100.0  PLT 181 172 184    Basic Metabolic Panel: Recent Labs  Lab 03/11/23 1315 03/12/23 0210  03/13/23 0826  NA 134* 134* 137  K 4.2 4.7 4.8  CL 98 100 100  CO2 26 23 22   GLUCOSE 363* 222* 161*  BUN 20 26* 38*  CREATININE 4.22* 5.25* 6.67*  CALCIUM 8.9 8.2* 8.5*  PHOS  --   --  4.3    GFR: Estimated Creatinine Clearance: 18.6 mL/min (A) (by C-G formula based on SCr of 6.67 mg/dL (H)).  Liver Function Tests: Recent Labs  Lab 03/11/23 1315 03/13/23 0826  AST 18  --   ALT 12  --   ALKPHOS 85  --   BILITOT 0.7  --   PROT 7.8  --   ALBUMIN 3.1* 2.6*     Coagulation  Profile: Recent Labs  Lab 03/11/23 1326  INR 1.1    HbA1C: Recent Labs    03/11/23 1819  HGBA1C 9.3*    CBG: Recent Labs  Lab 03/12/23 0758 03/12/23 1145 03/12/23 1603 03/12/23 1946 03/13/23 0733  GLUCAP 187* 208* 203* 199* 160*    Sepsis Labs: Recent Labs  Lab 03/11/23 1311 03/11/23 1554 03/12/23 0210  LATICACIDVEN 3.1* 2.1* 1.2     Recent Results (from the past 240 hour(s))  Resp panel by RT-PCR (RSV, Flu A&B, Covid) Anterior Nasal Swab     Status: None   Collection Time: 03/11/23  1:07 PM   Specimen: Anterior Nasal Swab  Result Value Ref Range Status   SARS Coronavirus 2 by RT PCR NEGATIVE NEGATIVE Final    Comment: (NOTE) SARS-CoV-2 target nucleic acids are NOT DETECTED.  The SARS-CoV-2 RNA is generally detectable in upper respiratory specimens during the acute phase of infection. The lowest concentration of SARS-CoV-2 viral copies this assay can detect is 138 copies/mL. A negative result does not preclude SARS-Cov-2 infection and should not be used as the sole basis for treatment or other patient management decisions. A negative result may occur with  improper specimen collection/handling, submission of specimen other than nasopharyngeal swab, presence of viral mutation(s) within the areas targeted by this assay, and inadequate number of viral copies(<138 copies/mL). A negative result must be combined with clinical observations, patient history, and epidemiological information. The expected result is Negative.  Fact Sheet for Patients:  BloggerCourse.com  Fact Sheet for Healthcare Providers:  SeriousBroker.it  This test is no t yet approved or cleared by the [place] FDA and  has been authorized for detection and/or diagnosis of SARS-CoV-2 by FDA under an Emergency Use Authorization (EUA). This EUA will remain  in effect (meaning this test can be used) for the duration of the COVID-19  declaration under Section 564(b)(1) of the Act, 21 U.S.C.section 360bbb-3(b)(1), unless the authorization is terminated  or revoked sooner.       Influenza A by PCR NEGATIVE NEGATIVE Final   Influenza B by PCR NEGATIVE NEGATIVE Final    Comment: (NOTE) The Xpert Xpress SARS-CoV-2/FLU/RSV plus assay is intended as an aid in the diagnosis of influenza from Nasopharyngeal swab specimens and should not be used as a sole basis for treatment. Nasal washings and aspirates are unacceptable for Xpert Xpress SARS-CoV-2/FLU/RSV testing.  Fact Sheet for Patients: BloggerCourse.com  Fact Sheet for Healthcare Providers: SeriousBroker.it  This test is not yet approved or cleared by the [place] FDA and has been authorized for detection and/or diagnosis of SARS-CoV-2 by FDA under an Emergency Use Authorization (EUA). This EUA will remain in effect (meaning this test can be used) for the duration of the COVID-19 declaration under Section 564(b)(1) of the Act,  21 U.S.C. section 360bbb-3(b)(1), unless the authorization is terminated or revoked.     Resp Syncytial Virus by PCR NEGATIVE NEGATIVE Final    Comment: (NOTE) Fact Sheet for Patients: BloggerCourse.com  Fact Sheet for Healthcare Providers: SeriousBroker.it  This test is not yet approved or cleared by the [place] FDA and has been authorized for detection and/or diagnosis of SARS-CoV-2 by FDA under an Emergency Use Authorization (EUA). This EUA will remain in effect (meaning this test can be used) for the duration of the COVID-19 declaration under Section 564(b)(1) of the Act, 21 U.S.C. section 360bbb-3(b)(1), unless the authorization is terminated or revoked.  Performed at [facility], [address], [place], [place] [address]   Blood Culture (routine x 2)     Status: None (Preliminary result)    Collection Time: 03/11/23  1:18 PM   Specimen: BLOOD  Result Value Ref Range Status   Specimen Description   Final    BLOOD SITE NOT SPECIFIED Performed at [facility], [address] [place], [place] [address]    Special Requests   Final    BOTTLES DRAWN AEROBIC AND ANAEROBIC Blood Culture adequate volume Performed at [facility], [address] [place], [place] [address]    Culture   Final    NO GROWTH 2 DAYS Performed at [facility], [address] [place], [place] [address]    Report Status PENDING  Incomplete  Blood Culture (routine x 2)     Status: None (Preliminary result)   Collection Time: 03/11/23  1:19 PM   Specimen: BLOOD  Result Value Ref Range Status   Specimen Description   Final    BLOOD SITE NOT SPECIFIED Performed at [facility], [address], [place], [place] [address]    Special Requests   Final    BOTTLES DRAWN AEROBIC AND ANAEROBIC Blood Culture adequate volume Performed at [facility], [address] [place], [place] [address]    Culture   Final    NO GROWTH 2 DAYS Performed at [facility], [address], [place], [place] [address]    Report Status PENDING  Incomplete  Aerobic Culture w Gram Stain (superficial specimen)     Status: None (Preliminary result)   Collection Time: 03/11/23  3:34 PM   Specimen: Joint, Finger; Wound  Result Value Ref Range Status   Specimen Description   Final    FINGER Performed at [facility], [address], [place], [place] [address]    Special Requests   Final    Immunocompromised Performed at [facility], [address] [place], [place] [address]    Gram Stain NO WBC SEEN FEW GRAM POSITIVE COCCI IN PAIRS   Final   Culture   Final    RARE STAPHYLOCOCCUS AUREUS CULTURE REINCUBATED FOR BETTER GROWTH SUSCEPTIBILITIES TO FOLLOW Performed at [facility], [address], [place], [place] [address]    Report Status PENDING  Incomplete         Radiology Studies: DG Finger Middle Left  Result Date: 03/11/2023 CLINICAL DATA:  Pain and swelling EXAM: LEFT MIDDLE FINGER 2+V COMPARISON:  None Available. FINDINGS: No displaced fracture or dislocation is seen. No focal lytic lesions are seen. There is marked soft tissue swelling over the dorsal and lateral aspect of PIP joint. There is 1.6 cm smoothly marginated low-density in the area of soft tissue swelling. There is  linear calcification in the medial margin of the neck of the third metacarpal. This may be residual from previous injury. IMPRESSION: No recent fracture or dislocation is seen in left middle finger. No focal lytic lesions are seen. There is marked soft tissue swelling over the PIP joint. There is 1.6 cm area of smooth marginated low-density in the area of soft tissue swelling. This may suggest air trapped in the soft tissues due to infectious process or an open wound in the skin. If there is clinical suspicion for osteomyelitis, follow-up MRI may be considered. Electronically Signed   By: Ernie Avena M.D.   On: 03/11/2023 13:21   DG Chest Port 1 View  Result Date: 03/11/2023 CLINICAL DATA:  Possible sepsis EXAM: PORTABLE CHEST 1 VIEW COMPARISON:  Previous studies including the examination of 11/10/2022 FINDINGS: Transverse diameter of heart is increased. Central pulmonary vessels are prominent without signs of alveolar pulmonary edema. There is no focal pulmonary consolidation. Linear densities are seen in right lower lung field. Costophrenic angles are clear. There is no pneumothorax. Metallic sutures are seen in the sternum. There is interval removal of right IJ dialysis catheter. IMPRESSION: There are no signs of pulmonary edema or focal pulmonary consolidation. Linear densities in right lower lung field may suggest crowding of normal bronchovascular structures or subsegmental atelectasis.  Electronically Signed   By: Ernie Avena M.D.   On: 03/11/2023 13:15        Scheduled Meds:  aspirin EC  81 mg Oral QHS   atorvastatin  80 mg Oral Daily   carvedilol  25 mg Oral BID   Chlorhexidine Gluconate Cloth  6 each Topical Q0600   feeding supplement (NEPRO CARB STEADY)  237 mL Oral BID BM   heparin  5,000 Units Subcutaneous Q8H   insulin aspart  0-5 Units Subcutaneous QHS   insulin aspart  0-9 Units Subcutaneous TID WC   insulin aspart protamine- aspart  18 Units Subcutaneous BID WC   isosorbide mononitrate  60 mg Oral Daily   living well with diabetes book   Does not apply Once   Continuous Infusions:  anticoagulant sodium citrate     vancomycin       LOS: 2 days     Osvaldo Shipper,  [facility] Available via Epic secure chat 7am-7pm After these hours, please refer to coverage provider listed on [email or web address] 03/13/2023, 11:40 AM

## 2023-03-13 NOTE — Plan of Care (Signed)
  Problem: Education: Goal: Knowledge of General Education information will improve Description: Including pain rating scale, medication(s)/side effects and non-pharmacologic comfort measures Outcome: Progressing   Problem: Clinical Measurements: Goal: Will remain free from infection Outcome: Progressing   Problem: Activity: Goal: Risk for activity intolerance will decrease Outcome: Progressing   

## 2023-03-13 NOTE — Progress Notes (Signed)
After the intial bolus, No further c/o dizziness, SBP 100/75, Pt remains alert/oriented in no apparent distress. Closely monitored.

## 2023-03-13 NOTE — Progress Notes (Signed)
Pt seen in room c/o dizziness, sbp 87/76( see flowsheets for details), Attending MD notified,and received order for a bolus of  500mg  of sodium chloride.  03/13/23 1745  Assess: MEWS Score  Temp 98 F (36.7 C)  BP (!) 87/76  MAP (mmHg) (!) 59  Pulse Rate 74  Resp 20  Level of Consciousness Alert  SpO2 95 %  O2 Device Room Air  Assess: MEWS Score  MEWS Temp 0  MEWS Systolic 1  MEWS Pulse 0  MEWS RR 0  MEWS LOC 0  MEWS Score 1  MEWS Score Color Green  Assess: if the MEWS score is Yellow or Red  Were vital signs taken at a resting state? Yes  Focused Assessment Change from prior assessment (see assessment flowsheet)  Does the patient meet 2 or more of the SIRS criteria? No  MEWS guidelines implemented  Yes, yellow  Treat  MEWS Interventions Considered administering scheduled or prn medications/treatments as ordered  Take Vital Signs  Increase Vital Sign Frequency  Yellow: Q2hr x1, continue Q4hrs until patient remains green for 12hrs  Escalate  MEWS: Escalate Yellow: Discuss with charge nurse and consider notifying provider and/or RRT  Notify: Charge Nurse/RN  Name of Charge Nurse/RN Notified Lauren,RN  Provider Notification  Provider Name/Title Dr.  Date Provider Notified 03/13/23  Time Provider Notified 1745  Method of Notification Page  Notification Reason Change in status  Provider response See new orders  Date of Provider Response 03/13/23  Time of Provider Response 1746  Assess: SIRS CRITERIA  SIRS Temperature  0  SIRS Pulse 0  SIRS Respirations  0  SIRS WBC 0  SIRS Score Sum  0

## 2023-03-13 NOTE — Inpatient Diabetes Management (Signed)
Inpatient Diabetes Program Recommendations  AACE/ADA: New Consensus Statement on Inpatient Glycemic Control   Target Ranges:  Prepandial:   less than 140 mg/dL      Peak postprandial:   less than 180 mg/dL (1-2 hours)      Critically ill patients:  140 - 180 mg/dL    Latest Reference Range & Units 03/12/23 07:58 03/12/23 11:45 03/12/23 16:03 03/12/23 19:46 03/13/23 07:33  Glucose-Capillary 70 - 99 mg/dL 161 (H) 096 (H) 045 (H) 199 (H) 160 (H)   Review of Glycemic Control  Diabetes history: DM2 Outpatient Diabetes medications: 70/30 36 units BID, Ozempic (not taking) Current orders for Inpatient glycemic control: 70/30 18 units BID, Novolog 0-9 units TID and 0-5 units at bedtime  Inpatient Diabetes Program Recommendations:    Insulin:  Please consider increasing 70/30 to 20 units BID.  Thanks, Orlando Penner, RN, MSN, CDCES Diabetes Coordinator Inpatient Diabetes Program [phone] (Team Pager from 8am to 5pm)

## 2023-03-14 ENCOUNTER — Emergency Department (HOSPITAL_COMMUNITY)
Admission: EM | Admit: 2023-03-14 | Discharge: 2023-03-14 | Disposition: A | Discharge status: Home / Self Care | Payer: 59 | Attending: Emergency Medicine | Admitting: Emergency Medicine

## 2023-03-14 DIAGNOSIS — L02512 Cutaneous abscess of left hand: Secondary | ICD-10-CM | POA: Diagnosis present

## 2023-03-14 DIAGNOSIS — Z794 Long term (current) use of insulin: Secondary | ICD-10-CM | POA: Diagnosis not present

## 2023-03-14 DIAGNOSIS — Z7982 Long term (current) use of aspirin: Secondary | ICD-10-CM | POA: Diagnosis not present

## 2023-03-14 LAB — AEROBIC CULTURE W GRAM STAIN (SUPERFICIAL SPECIMEN): Gram Stain: NONE SEEN

## 2023-03-14 LAB — GLUCOSE, CAPILLARY: Glucose-Capillary: 180 mg/dL — ABNORMAL HIGH (ref 70–99)

## 2023-03-14 MED ORDER — CEPHALEXIN 250 MG PO CAPS: 250.0000 mg | ORAL_CAPSULE | Freq: Two times a day (BID) | ORAL | 0 refills | Status: AC

## 2023-03-14 MED ORDER — CLINDAMYCIN HCL 300 MG PO CAPS: 300.0000 mg | ORAL_CAPSULE | Freq: Three times a day (TID) | ORAL | 0 refills | Status: DC

## 2023-03-14 MED ORDER — DOXYCYCLINE HYCLATE 100 MG PO TABS: 100.0000 mg | ORAL_TABLET | Freq: Two times a day (BID) | ORAL | 0 refills | Status: AC

## 2023-03-14 NOTE — ED Triage Notes (Signed)
Pt relays he left hospital AMA  but would like to get his abx Rx called in. Pt has no additional complaints at this time.

## 2023-03-14 NOTE — Progress Notes (Signed)
    Patient Name: Howard Mcmahon           DOB: 05-21-1971  MRN: [id number]      Admission Date: 03/11/2023  Attending Provider: Osvaldo Shipper, MD  Primary Diagnosis: Sepsis [facility])   Level of care: Telemetry Medical    CROSS COVER NOTE   Date of Service   03/14/2023   Howard Mcmahon, 52 y.o. male, was admitted on 03/11/2023 for Sepsis [facility]).    HPI/Events of Note   BP soft 85/66, complains of dizziness.  Similar episode today after dialysis.   No other changes reported.  All other vitals stable. Order placed for 250 cc bolus   Interventions/ Plan   Above        Howard Harada, DNP, [facility] [place]

## 2023-03-14 NOTE — Plan of Care (Signed)
  Problem: Health Behavior/Discharge Planning: Goal: Ability to manage health-related needs will improve Outcome: Progressing   Problem: Nutritional: Goal: Maintenance of adequate nutrition will improve Outcome: Progressing   Problem: Tissue Perfusion: Goal: Adequacy of tissue perfusion will improve Outcome: Progressing   Problem: Safety: Goal: Ability to remain free from injury will improve Outcome: Progressing

## 2023-03-14 NOTE — Discharge Summary (Signed)
[facility]  Physician Discharge Summary   Patient ID: Trysten Presutti MRN: [id number] DOB/AGE: March 16, 1971 52 y.o.  Admit date: 03/11/2023 Discharge date: 03/14/2023    PCP: [facility]  DISCHARGE DIAGNOSES:  Infection involving the middle finger of the left hand  PATIENT LEFT AGAINST MEDICAL ADVICE  INITIAL HISTORY: Jonel Prouty is a 52 y.o. male with PMH significant for ESRD-HD-MWF, DM2, HTN, HLD, CAD/MI, ischemic cardiomyopathy, chronic combined CHF, PAD s/p left BKA, GERD, chronic anemia, chronic anxiety/depression Patient presented to [facility] ED with concern of infection in his hand.  He noticed pain and swelling of his left middle finger that radiated towards the forearm.  S/P excision of desquamated epidermis.   Consultations: Dr. Janee Morn with orthopedics  HOSPITAL COURSE:   Left middle finger cellulitis and abscess/Sepsis POA Presented with worsening pain, fever, lactic acidosis X-ray without evidence of osteomyelitis Drained in the ED. Seen by hand surgery.  Underwent excision of desquamated epidermis.  No further interventions per hand surgery.   Plan was for patient to be discharged today however he for unexplained reasons decided to leave AGAINST MEDICAL ADVICE.   Initially prescription was sent for doxycycline and Keflex.  However subsequently cultures came back positive for MRSA. Looks like the patient visited the emergency department after he left AMA and was prescribed clindamycin.   ESRD-HD-MWF   Chronic hepatitis B Patient was not aware of this.  He was asked to follow-up with his outpatient providers for further management.   Type 2 diabetes mellitus with hyperglycemia   CAD/MI PAD s/p left BKA HLD   Ischemic cardiomyopathy chronic combined CHF Essential hypertension    Normocytic anemia   Obesity Estimated body mass index is 31.64 kg/m as calculated from the following:   Height as of this encounter: 6\' 4"  (1.93 m).   Weight as of this  encounter: 117.9 kg.      PERTINENT LABS:  The results of significant diagnostics from this hospitalization (including imaging, microbiology, ancillary and laboratory) are listed below for reference.    Microbiology: Recent Results (from the past 240 hour(s))  Resp panel by RT-PCR (RSV, Flu A&B, Covid) Anterior Nasal Swab     Status: None   Collection Time: 03/11/23  1:07 PM   Specimen: Anterior Nasal Swab  Result Value Ref Range Status   SARS Coronavirus 2 by RT PCR NEGATIVE NEGATIVE Final    Comment: (NOTE) SARS-CoV-2 target nucleic acids are NOT DETECTED.  The SARS-CoV-2 RNA is generally detectable in upper respiratory specimens during the acute phase of infection. The lowest concentration of SARS-CoV-2 viral copies this assay can detect is 138 copies/mL. A negative result does not preclude SARS-Cov-2 infection and should not be used as the sole basis for treatment or other patient management decisions. A negative result may occur with  improper specimen collection/handling, submission of specimen other than nasopharyngeal swab, presence of viral mutation(s) within the areas targeted by this assay, and inadequate number of viral copies(<138 copies/mL). A negative result must be combined with clinical observations, patient history, and epidemiological information. The expected result is Negative.  Fact Sheet for Patients:  BloggerCourse.com  Fact Sheet for Healthcare Providers:  SeriousBroker.it  This test is no t yet approved or cleared by the [place] FDA and  has been authorized for detection and/or diagnosis of SARS-CoV-2 by FDA under an Emergency Use Authorization (EUA). This EUA will remain  in effect (meaning this test can be used) for the duration of the COVID-19 declaration under Section 564(b)(1) of  the Act, 21 U.S.C.section 360bbb-3(b)(1), unless the authorization is terminated  or revoked sooner.        Influenza A by PCR NEGATIVE NEGATIVE Final   Influenza B by PCR NEGATIVE NEGATIVE Final    Comment: (NOTE) The Xpert Xpress SARS-CoV-2/FLU/RSV plus assay is intended as an aid in the diagnosis of influenza from Nasopharyngeal swab specimens and should not be used as a sole basis for treatment. Nasal washings and aspirates are unacceptable for Xpert Xpress SARS-CoV-2/FLU/RSV testing.  Fact Sheet for Patients: BloggerCourse.com  Fact Sheet for Healthcare Providers: SeriousBroker.it  This test is not yet approved or cleared by the [place] FDA and has been authorized for detection and/or diagnosis of SARS-CoV-2 by FDA under an Emergency Use Authorization (EUA). This EUA will remain in effect (meaning this test can be used) for the duration of the COVID-19 declaration under Section 564(b)(1) of the Act, 21 U.S.C. section 360bbb-3(b)(1), unless the authorization is terminated or revoked.     Resp Syncytial Virus by PCR NEGATIVE NEGATIVE Final    Comment: (NOTE) Fact Sheet for Patients: BloggerCourse.com  Fact Sheet for Healthcare Providers: SeriousBroker.it  This test is not yet approved or cleared by the [place] FDA and has been authorized for detection and/or diagnosis of SARS-CoV-2 by FDA under an Emergency Use Authorization (EUA). This EUA will remain in effect (meaning this test can be used) for the duration of the COVID-19 declaration under Section 564(b)(1) of the Act, 21 U.S.C. section 360bbb-3(b)(1), unless the authorization is terminated or revoked.  Performed at [facility], [address], [place], [place] [address]   Blood Culture (routine x 2)     Status: None (Preliminary result)   Collection Time: 03/11/23  1:18 PM   Specimen: BLOOD  Result Value Ref Range Status   Specimen Description   Final    BLOOD SITE NOT SPECIFIED Performed  at [facility], [address], [place], [place] [address]    Special Requests   Final    BOTTLES DRAWN AEROBIC AND ANAEROBIC Blood Culture adequate volume Performed at [facility], [address], [place], [place] [address]    Culture   Final    NO GROWTH 4 DAYS Performed at [facility], [address] [place], [place] [address]    Report Status PENDING  Incomplete  Blood Culture (routine x 2)     Status: None (Preliminary result)   Collection Time: 03/11/23  1:19 PM   Specimen: BLOOD  Result Value Ref Range Status   Specimen Description   Final    BLOOD SITE NOT SPECIFIED Performed at [facility], [address], [place], [place] [address]    Special Requests   Final    BOTTLES DRAWN AEROBIC AND ANAEROBIC Blood Culture adequate volume Performed at [facility], [address] [place], [place] [address]    Culture   Final    NO GROWTH 4 DAYS Performed at [facility], [address], [place], [place] [address]    Report Status PENDING  Incomplete  Aerobic Culture w Gram Stain (superficial specimen)     Status: None   Collection Time: 03/11/23  3:34 PM   Specimen: Joint, Finger; Wound  Result Value Ref Range Status   Specimen Description   Final    FINGER Performed at [facility], [address], [place], [place] [address]    Special Requests   Final  Immunocompromised Performed at [facility], [address], [place], [place] [address]    Gram Stain   Final    NO WBC SEEN FEW GRAM POSITIVE COCCI IN PAIRS Performed at [facility], [address], [place], [place] [address]    Culture RARE METHICILLIN RESISTANT STAPHYLOCOCCUS AUREUS  Final   Report Status 03/14/2023 FINAL  Final   Organism ID, Bacteria METHICILLIN RESISTANT STAPHYLOCOCCUS AUREUS  Final      Susceptibility   Methicillin resistant staphylococcus aureus - MIC*     CIPROFLOXACIN <=0.5 SENSITIVE Sensitive     ERYTHROMYCIN >=8 RESISTANT Resistant     GENTAMICIN <=0.5 SENSITIVE Sensitive     OXACILLIN >=4 RESISTANT Resistant     TETRACYCLINE >=16 RESISTANT Resistant     VANCOMYCIN 1 SENSITIVE Sensitive     TRIMETH/SULFA <=10 SENSITIVE Sensitive     CLINDAMYCIN <=0.25 SENSITIVE Sensitive     RIFAMPIN <=0.5 SENSITIVE Sensitive     Inducible Clindamycin NEGATIVE Sensitive     LINEZOLID 2 SENSITIVE Sensitive     * RARE METHICILLIN RESISTANT STAPHYLOCOCCUS AUREUS     Labs:   Basic Metabolic Panel: Recent Labs  Lab 03/11/23 1315 03/12/23 0210 03/13/23 0826  NA 134* 134* 137  K 4.2 4.7 4.8  CL 98 100 100  CO2 26 23 22   GLUCOSE 363* 222* 161*  BUN 20 26* 38*  CREATININE 4.22* 5.25* 6.67*  CALCIUM 8.9 8.2* 8.5*  PHOS  --   --  4.3   Liver Function Tests: Recent Labs  Lab 03/11/23 1315 03/13/23 0826  AST 18  --   ALT 12  --   ALKPHOS 85  --   BILITOT 0.7  --   PROT 7.8  --   ALBUMIN 3.1* 2.6*    CBC: Recent Labs  Lab 03/11/23 1315 03/12/23 0210 03/13/23 0828  WBC 7.4 7.3 7.4  NEUTROABS 5.5  --   --   HGB 10.5* 9.6* 9.4*  HCT 32.9* 29.7* 29.5*  MCV 100.6* 100.7* 100.0  PLT 181 172 184    CBG: Recent Labs  Lab 03/13/23 0733 03/13/23 1317 03/13/23 1621 03/13/23 2038 03/14/23 0732  GLUCAP 160* 155* 234* 233* 180*     IMAGING STUDIES DG Finger Middle Left  Result Date: 03/11/2023 CLINICAL DATA:  Pain and swelling EXAM: LEFT MIDDLE FINGER 2+V COMPARISON:  None Available. FINDINGS: No displaced fracture or dislocation is seen. No focal lytic lesions are seen. There is marked soft tissue swelling over the dorsal and lateral aspect of PIP joint. There is 1.6 cm smoothly marginated low-density in the area of soft tissue swelling. There is linear calcification in the medial margin of the neck of the third metacarpal. This may be residual from previous injury. IMPRESSION: No recent fracture or dislocation is seen in left middle  finger. No focal lytic lesions are seen. There is marked soft tissue swelling over the PIP joint. There is 1.6 cm area of smooth marginated low-density in the area of soft tissue swelling. This may suggest air trapped in the soft tissues due to infectious process or an open wound in the skin. If there is clinical suspicion for osteomyelitis, follow-up MRI may be considered. Electronically Signed   By: Ernie Avena M.D.   On: 03/11/2023 13:21   DG Chest Port 1 View  Result Date: 03/11/2023 CLINICAL DATA:  Possible sepsis EXAM: PORTABLE CHEST 1 VIEW COMPARISON:  Previous studies including the examination of 11/10/2022 FINDINGS: Transverse diameter of heart is increased.  Central pulmonary vessels are prominent without signs of alveolar pulmonary edema. There is no focal pulmonary consolidation. Linear densities are seen in right lower lung field. Costophrenic angles are clear. There is no pneumothorax. Metallic sutures are seen in the sternum. There is interval removal of right IJ dialysis catheter. IMPRESSION: There are no signs of pulmonary edema or focal pulmonary consolidation. Linear densities in right lower lung field may suggest crowding of normal bronchovascular structures or subsegmental atelectasis. Electronically Signed   By: Ernie Avena M.D.   On: 03/11/2023 13:15    PATIENT LEFT AGAINST MEDICAL ADVICE   Osvaldo Shipper  [facility] Pager on [email or web address]  03/15/2023, 11:44 AM

## 2023-03-14 NOTE — Discharge Instructions (Addendum)
You will start clindamycin antibiotic take 3 time a day for a week for the abscess.

## 2023-03-14 NOTE — ED Provider Notes (Signed)
[place] EMERGENCY DEPARTMENT AT [facility] Provider Note   CSN: [id number] Arrival date & time: 03/14/23  0932     History  No chief complaint on file.   Howard Mcmahon is a 52 y.o. male.  Patient is a 52 year old male with multiple medical problems including end-stage renal disease on dialysis who was hospitalized and earlier today wanted to go outside and they told him he was not allowed and he had agreed disagreement with the staff and left.  He did call them and stated he wanted a prescription for his antibiotics but they told him he would need to go to the emergency room to receive them.  Patient is coming now just reporting he would like a prescription for oral antibiotics to take at home for the wound on his finger.  He has no other complaints at this time.  The history is provided by the patient and medical records.       Home Medications Prior to Admission medications   Medication Sig Start Date End Date Taking? Authorizing Provider  clindamycin (CLEOCIN) 300 MG capsule Take 1 capsule (300 mg total) by mouth 3 (three) times daily. 03/14/23  Yes Gwyneth Sprout, MD  acetaminophen (TYLENOL) 325 MG tablet Take 2 tablets (650 mg total) by mouth every 6 (six) hours as needed. Patient taking differently: Take 650 mg by mouth every 6 (six) hours as needed for mild pain. 08/14/21   Teressa Lower, PA-C  aspirin 81 MG EC tablet Take 1 tablet (81 mg total) by mouth at bedtime. Swallow whole. Patient taking differently: Take 162 mg by mouth at bedtime. 01/20/22   Albertine Grates, MD  atorvastatin (LIPITOR) 80 MG tablet Take 80 mg by mouth daily. 10/03/21   [provider]  Blood Glucose Monitoring Suppl (TRUE METRIX METER) w/Device KIT Use as directed Patient taking differently: 1 each by Other route as directed. 01/02/19   Grayce Sessions, NP  carvedilol (COREG) 25 MG tablet Take 25 mg by mouth 2 (two) times daily. 10/03/21   [provider]  cephALEXin  (KEFLEX) 250 MG capsule Take 1 capsule (250 mg total) by mouth 2 (two) times daily for 7 days. 03/14/23 03/21/23  Osvaldo Shipper, MD  doxycycline (VIBRA-TABS) 100 MG tablet Take 1 tablet (100 mg total) by mouth 2 (two) times daily for 7 days. 03/14/23 03/21/23  Osvaldo Shipper, MD  glucose blood (TRUE METRIX BLOOD GLUCOSE TEST) test strip Check blood sugar fasting and before meals and again if pt feels bad (symptoms of hypo). Check sugar three times a day Patient taking differently: 1 each by Other route See admin instructions. Check blood sugar fasting and before meals and again if pt feels bad (symptoms of hypo). Check sugar three times a day 10/10/19   Elpidio Anis, PA-C  hydrALAZINE (APRESOLINE) 50 MG tablet Take 50 mg by mouth 3 (three) times daily.    [provider]  Insulin Isophane & Regular Human (NOVOLIN 70/30 FLEXPEN RELION) (70-30) 100 UNIT/ML PEN Inject 18 Units into the skin 2 (two) times daily. Patient taking differently: Inject 36 Units into the skin 2 (two) times daily. 10/20/19   Osvaldo Shipper, MD  Insulin Pen Needle (NOVOFINE) 30G X 8 MM MISC Inject 10 each into the skin as needed. Patient taking differently: Inject 1 packet into the skin as needed (insulin). 10/20/19   Osvaldo Shipper, MD  isosorbide mononitrate (IMDUR) 120 MG 24 hr tablet Take 120 mg by mouth daily.    [provider]  nitroGLYCERIN (NITROSTAT) 0.4 MG SL tablet Place 0.4 mg under the tongue every 5 (five) minutes as needed for chest pain. 08/10/21   [provider]  VENTOLIN HFA 108 (90 Base) MCG/ACT inhaler Inhale 1-2 puffs into the lungs daily as needed for wheezing or shortness of breath. 03/02/21   [provider]      Allergies    Lisinopril, Lactose intolerance (gi), and Other    Review of Systems   Review of Systems  Physical Exam Updated Vital Signs BP 105/64   Pulse 81   Temp 98.1 F (36.7 C) (Oral)   Resp 18   SpO2 94%  Physical Exam Vitals and nursing note  reviewed.  Cardiovascular:     Rate and Rhythm: Normal rate.  Pulmonary:     Effort: Pulmonary effort is normal.  Musculoskeletal:     Comments: Bilateral BKA  Skin:    Comments: Wound on the left third finger is wrapped without surrounding erythema or swelling  Neurological:     Mental Status: He is alert.     ED Results / Procedures / Treatments   Labs (all labs ordered are listed, but only abnormal results are displayed) Labs Reviewed - No data to display  EKG None  Radiology No results found.  Procedures Procedures    Medications Ordered in ED Medications - No data to display  ED Course/ Medical Decision Making/ A&P                             Medical Decision Making Risk Prescription drug management.   Patient is here today reporting he just wants a prescription for the antibiotic that he needs for his finger wound.  Patient was admitted to the hospital for abscess and cellulitis of his finger.  He initially was getting Vanc, cefepime and metronidazole.  He then was changed to IV Rocephin however sensitivities came back and patient grew out MRSA which is sensitive to clindamycin.  They were discussing changing him to oral therapy but had not done that yet prior to leaving and discharge summaries are not available.  Will give patient 7 days of clindamycin.  Discussed this with the pharmacist who agrees this is appropriate treatment.  Also he does not need any adjustments for his dialysis.  All this was discussed with the patient and his family member who is at bedside.  Patient was discharged        Final Clinical Impression(s) / ED Diagnoses Final diagnoses:  Abscess of finger of left hand    Rx / DC Orders ED Discharge Orders          Ordered    clindamycin (CLEOCIN) 300 MG capsule  3 times daily        03/14/23 1022              Gwyneth Sprout, MD 03/14/23 1036

## 2023-03-14 NOTE — Care Management Important Message (Signed)
Important Message  Patient Details  Name: Howard Mcmahon MRN: [id number] Date of Birth: [birth year]   Medicare Important Message Given:  Yes  Patient left prior to IM delivery will mail a copy to the patient home address.    [name] 03/14/2023, 10:23 AM

## 2023-03-15 NOTE — Progress Notes (Signed)
Late Note Entry  Chart reviewed. Appears pt left hospital yesterday and then returned to ED for a prescription. Contacted [facility] this morning and spoke to a Charity fundraiser. Clinic staff aware of the above info and that pt should resume care today. Clinic has access to [facility] for needed clinicals.   Olivia Canter Renal Navigator [phone]

## 2023-03-16 LAB — CULTURE, BLOOD (ROUTINE X 2)
Culture: NO GROWTH
Culture: NO GROWTH
Special Requests: ADEQUATE
Special Requests: ADEQUATE

## 2023-03-17 ENCOUNTER — Other Ambulatory Visit: Payer: Self-pay

## 2023-03-17 ENCOUNTER — Emergency Department (HOSPITAL_BASED_OUTPATIENT_CLINIC_OR_DEPARTMENT_OTHER)
Admission: EM | Admit: 2023-03-17 | Discharge: 2023-03-17 | Disposition: A | Discharge status: Home / Self Care | Payer: 59 | Attending: Emergency Medicine | Admitting: Emergency Medicine

## 2023-03-17 ENCOUNTER — Encounter (HOSPITAL_BASED_OUTPATIENT_CLINIC_OR_DEPARTMENT_OTHER): Payer: Self-pay | Admitting: Emergency Medicine

## 2023-03-17 DIAGNOSIS — Z7982 Long term (current) use of aspirin: Secondary | ICD-10-CM | POA: Diagnosis not present

## 2023-03-17 DIAGNOSIS — Z5189 Encounter for other specified aftercare: Secondary | ICD-10-CM | POA: Diagnosis not present

## 2023-03-17 DIAGNOSIS — T819XXA Unspecified complication of procedure, initial encounter: Secondary | ICD-10-CM | POA: Insufficient documentation

## 2023-03-17 DIAGNOSIS — Z794 Long term (current) use of insulin: Secondary | ICD-10-CM | POA: Diagnosis not present

## 2023-03-17 NOTE — ED Provider Notes (Signed)
[place] EMERGENCY DEPARTMENT AT [facility] Provider Note   CSN: [id number] Arrival date & time: 03/17/23  1436     History  Chief Complaint  Patient presents with   Finger Injury    Howard Mcmahon is a 52 y.o. male.  Patient here for wound check.  Had I&D done to his left middle finger and is on clindamycin.  He wanted to have this looked at and have further recommendations.  He denies any fever or chills.  There is been no drainage.  The history is provided by the patient.       Home Medications Prior to Admission medications   Medication Sig Start Date End Date Taking? Authorizing Provider  acetaminophen (TYLENOL) 325 MG tablet Take 2 tablets (650 mg total) by mouth every 6 (six) hours as needed. Patient taking differently: Take 650 mg by mouth every 6 (six) hours as needed for mild pain. 08/14/21   Teressa Lower, PA-C  aspirin 81 MG EC tablet Take 1 tablet (81 mg total) by mouth at bedtime. Swallow whole. Patient taking differently: Take 162 mg by mouth at bedtime. 01/20/22   Albertine Grates, MD  atorvastatin (LIPITOR) 80 MG tablet Take 80 mg by mouth daily. 10/03/21   [provider]  Blood Glucose Monitoring Suppl (TRUE METRIX METER) w/Device KIT Use as directed Patient taking differently: 1 each by Other route as directed. 01/02/19   Grayce Sessions, NP  carvedilol (COREG) 25 MG tablet Take 25 mg by mouth 2 (two) times daily. 10/03/21   [provider]  cephALEXin (KEFLEX) 250 MG capsule Take 1 capsule (250 mg total) by mouth 2 (two) times daily for 7 days. 03/14/23 03/21/23  Osvaldo Shipper, MD  clindamycin (CLEOCIN) 300 MG capsule Take 1 capsule (300 mg total) by mouth 3 (three) times daily. 03/14/23   Gwyneth Sprout, MD  doxycycline (VIBRA-TABS) 100 MG tablet Take 1 tablet (100 mg total) by mouth 2 (two) times daily for 7 days. 03/14/23 03/21/23  Osvaldo Shipper, MD  glucose blood (TRUE METRIX BLOOD GLUCOSE TEST) test strip Check blood sugar fasting  and before meals and again if pt feels bad (symptoms of hypo). Check sugar three times a day Patient taking differently: 1 each by Other route See admin instructions. Check blood sugar fasting and before meals and again if pt feels bad (symptoms of hypo). Check sugar three times a day 10/10/19   Elpidio Anis, PA-C  hydrALAZINE (APRESOLINE) 50 MG tablet Take 50 mg by mouth 3 (three) times daily.    [provider]  Insulin Isophane & Regular Human (NOVOLIN 70/30 FLEXPEN RELION) (70-30) 100 UNIT/ML PEN Inject 18 Units into the skin 2 (two) times daily. Patient taking differently: Inject 36 Units into the skin 2 (two) times daily. 10/20/19   Osvaldo Shipper, MD  Insulin Pen Needle (NOVOFINE) 30G X 8 MM MISC Inject 10 each into the skin as needed. Patient taking differently: Inject 1 packet into the skin as needed (insulin). 10/20/19   Osvaldo Shipper, MD  isosorbide mononitrate (IMDUR) 120 MG 24 hr tablet Take 120 mg by mouth daily.    [provider]  nitroGLYCERIN (NITROSTAT) 0.4 MG SL tablet Place 0.4 mg under the tongue every 5 (five) minutes as needed for chest pain. 08/10/21   [provider]  VENTOLIN HFA 108 (90 Base) MCG/ACT inhaler Inhale 1-2 puffs into the lungs daily as needed for wheezing or shortness of breath. 03/02/21   [provider]  Allergies    Lisinopril, Lactose intolerance (gi), and Other    Review of Systems   Review of Systems  Physical Exam Updated Vital Signs BP (!) 153/76 (BP Location: Right Arm)   Pulse 78   Temp 98.3 F (36.8 C) (Oral)   Resp 17   SpO2 100%  Physical Exam Skin:    General: Skin is warm.     Comments: Wound to the left middle finger has clean margins, there is no purulence or erythema and overall well-appearing     ED Results / Procedures / Treatments   Labs (all labs ordered are listed, but only abnormal results are displayed) Labs Reviewed - No data to display  EKG None  Radiology No results  found.  Procedures Procedures    Medications Ordered in ED Medications - No data to display  ED Course/ Medical Decision Making/ A&P                             Medical Decision Making  Howard Mcmahon is here for wound check.  He has very well-appearing wound to the left middle finger.  Margins are clean and dry, no erythema or purulence.  He has been taking clindamycin.  Recommend bacitracin ointment for few days as well as wet-to-dry dressings afterwards.  Wound will have to heal by secondary intention.  Discharged in good condition.  Neurovascularly intact.  This chart was dictated using voice recognition software.  Despite best efforts to proofread,  errors can occur which can change the documentation meaning.         Final Clinical Impression(s) / ED Diagnoses Final diagnoses:  Visit for wound check    Rx / DC Orders ED Discharge Orders     None         Virgina Norfolk, DO 03/17/23 1517

## 2023-03-17 NOTE — Discharge Instructions (Signed)
Use bacitracin ointment twice daily for the next 4 to 5 days and then do wet-to-dry dressings as discussed.  Wet a piece of gauze and wrap it and change twice a day.  Overall wound looks very well.

## 2023-03-17 NOTE — ED Triage Notes (Signed)
Left middle finger has a wound that was I/D ,pt is still on antibiotic that was prescribed.pt unsure of wound care, wanted evaluation.

## 2023-03-17 NOTE — ED Notes (Signed)
When Rn entered room to dc patient and discuss wound care, pt had left

## 2023-04-23 ENCOUNTER — Ambulatory Visit: Payer: 59 | Admitting: Podiatry

## 2023-06-10 ENCOUNTER — Encounter: Payer: Self-pay | Admitting: Podiatry

## 2023-06-10 ENCOUNTER — Ambulatory Visit (INDEPENDENT_AMBULATORY_CARE_PROVIDER_SITE_OTHER): Payer: Medicare HMO | Admitting: Podiatry

## 2023-06-10 DIAGNOSIS — M79674 Pain in right toe(s): Secondary | ICD-10-CM | POA: Insufficient documentation

## 2023-06-10 DIAGNOSIS — E1169 Type 2 diabetes mellitus with other specified complication: Secondary | ICD-10-CM | POA: Diagnosis not present

## 2023-06-10 DIAGNOSIS — B351 Tinea unguium: Secondary | ICD-10-CM | POA: Insufficient documentation

## 2023-06-10 NOTE — Progress Notes (Signed)
This patient presents to the office with chief complaint of long thick nails and diabetic right foot. This patient  says there  is  no pain and discomfort in his right foot..  This patient says there are long thick painful nails right foot.  These nails are painful walking and wearing shoes.  Patient has history of amputation left leg.  Amputation third toe right foot.  Patient is unable to  self treat his own nails . This patient presents  to the office today for treatment of the  long nails and a foot evaluation due to history of  diabetes.  General Appearance  Alert, conversant and in no acute stress.  Vascular  Dorsalis pedis and posterior tibial  pulses are diminished/absent  right foot..  Capillary return is within normal limits. Temperature is within normal limits .  Neurologic  Senn-Weinstein monofilament wire test absent right foot. Muscle power within normal limits bilaterally.  Nails Thick disfigured discolored nails with subungual debris  from 1,2,4 and 5 right foot. No evidence of bacterial infection or drainage bilaterally.  Orthopedic  No limitations of motion of motion feet .  No crepitus or effusions noted.  No bony pathology or digital deformities noted. Amputation left leg as well 3rd toe right foot.  Skin  normotropic skin with no porokeratosis noted bilaterally.  No signs of infections or ulcers noted.     Onychomycosis  right foot.  Diabetes with angiopathy and absent LOPS  right foot.  IE  Debride nails x 5.   A diabetic foot exam was performed and there is evidence of  both vascular or neurologic pathology.   RTC 3 months.   Helane Gunther DPM

## 2023-08-02 ENCOUNTER — Other Ambulatory Visit: Payer: Self-pay

## 2023-08-02 ENCOUNTER — Emergency Department (HOSPITAL_BASED_OUTPATIENT_CLINIC_OR_DEPARTMENT_OTHER)
Admission: EM | Admit: 2023-08-02 | Discharge: 2023-08-02 | Disposition: A | Discharge status: Home / Self Care | Payer: Medicare HMO | Attending: Emergency Medicine | Admitting: Emergency Medicine

## 2023-08-02 ENCOUNTER — Encounter (HOSPITAL_BASED_OUTPATIENT_CLINIC_OR_DEPARTMENT_OTHER): Payer: Self-pay

## 2023-08-02 DIAGNOSIS — Z7982 Long term (current) use of aspirin: Secondary | ICD-10-CM | POA: Insufficient documentation

## 2023-08-02 DIAGNOSIS — Z794 Long term (current) use of insulin: Secondary | ICD-10-CM | POA: Insufficient documentation

## 2023-08-02 DIAGNOSIS — E109 Type 1 diabetes mellitus without complications: Secondary | ICD-10-CM | POA: Diagnosis not present

## 2023-08-02 DIAGNOSIS — L02215 Cutaneous abscess of perineum: Secondary | ICD-10-CM | POA: Insufficient documentation

## 2023-08-02 LAB — COMPREHENSIVE METABOLIC PANEL
ALT: 14 U/L (ref 0–44)
AST: 11 U/L — ABNORMAL LOW (ref 15–41)
Albumin: 3.9 g/dL (ref 3.5–5.0)
Alkaline Phosphatase: 94 U/L (ref 38–126)
Anion gap: 9 (ref 5–15)
BUN: 35 mg/dL — ABNORMAL HIGH (ref 6–20)
CO2: 26 mmol/L (ref 22–32)
Calcium: 9.1 mg/dL (ref 8.9–10.3)
Chloride: 100 mmol/L (ref 98–111)
Creatinine, Ser: 4.97 mg/dL — ABNORMAL HIGH (ref 0.61–1.24)
GFR, Estimated: 13 mL/min — ABNORMAL LOW (ref >60)
Glucose, Bld: 326 mg/dL — ABNORMAL HIGH (ref 70–99)
Potassium: 3.7 mmol/L (ref 3.5–5.1)
Sodium: 135 mmol/L (ref 135–145)
Total Bilirubin: 0.5 mg/dL (ref <1.2)
Total Protein: 7.7 g/dL (ref 6.5–8.1)

## 2023-08-02 LAB — CBC WITH DIFFERENTIAL/PLATELET
Abs Immature Granulocytes: 0.07 10*3/uL (ref 0.00–0.07)
Basophils Absolute: 0 10*3/uL (ref 0.0–0.1)
Basophils Relative: 0 %
Eosinophils Absolute: 0.3 10*3/uL (ref 0.0–0.5)
Eosinophils Relative: 2 %
HCT: 35.3 % — ABNORMAL LOW (ref 39.0–52.0)
Hemoglobin: 11.4 g/dL — ABNORMAL LOW (ref 13.0–17.0)
Immature Granulocytes: 1 %
Lymphocytes Relative: 16 %
Lymphs Abs: 1.9 10*3/uL (ref 0.7–4.0)
MCH: 32.9 pg (ref 26.0–34.0)
MCHC: 32.3 g/dL (ref 30.0–36.0)
MCV: 102 fL — ABNORMAL HIGH (ref 80.0–100.0)
Monocytes Absolute: 0.6 10*3/uL (ref 0.1–1.0)
Monocytes Relative: 5 %
Neutro Abs: 9 10*3/uL — ABNORMAL HIGH (ref 1.7–7.7)
Neutrophils Relative %: 76 %
Platelets: 223 10*3/uL (ref 150–400)
RBC: 3.46 MIL/uL — ABNORMAL LOW (ref 4.22–5.81)
RDW: 13.4 % (ref 11.5–15.5)
WBC: 11.8 10*3/uL — ABNORMAL HIGH (ref 4.0–10.5)
nRBC: 0 % (ref 0.0–0.2)

## 2023-08-02 LAB — CBG MONITORING, ED: Glucose-Capillary: 294 mg/dL — ABNORMAL HIGH (ref 70–99)

## 2023-08-02 MED ORDER — DOXYCYCLINE HYCLATE 100 MG PO TABS
100.0000 mg | ORAL_TABLET | Freq: Once | ORAL | Status: AC
  Administered 2023-08-02: 100 mg via ORAL
  Filled 2023-08-02: qty 1

## 2023-08-02 MED ORDER — LIDOCAINE-EPINEPHRINE (PF) 2 %-1:200000 IJ SOLN
INTRAMUSCULAR | Status: AC
  Filled 2023-08-02: qty 20

## 2023-08-02 MED ORDER — LIDOCAINE-EPINEPHRINE 2 %-1:100000 IJ SOLN: 20.0000 mL | Freq: Once | INTRAMUSCULAR | Status: DC

## 2023-08-02 MED ORDER — LIDOCAINE-EPINEPHRINE-TETRACAINE (LET) TOPICAL GEL
3.0000 mL | Freq: Once | TOPICAL | Status: AC
  Administered 2023-08-02: 3 mL via TOPICAL
  Filled 2023-08-02: qty 3

## 2023-08-02 MED ORDER — DOXYCYCLINE HYCLATE 100 MG PO CAPS: 100.0000 mg | ORAL_CAPSULE | Freq: Two times a day (BID) | ORAL | 0 refills | Status: DC

## 2023-08-02 NOTE — ED Provider Notes (Signed)
[place] EMERGENCY DEPARTMENT AT [facility] Provider Note   CSN: [id number] Arrival date & time: 08/02/23  1521     History  Chief Complaint  Patient presents with   Abscess    Howard Mcmahon is a 52 y.o. male who presents emergency department with a chief complaint of abscess of the perineum.  He has recurrent episodes of abscess there.  History of type 1 diabetes.  Patient noticed small bump below the scrotal region on the left lower perineum few days ago.  He applied Vaseline he noticed some discharge but the swelling has not worsened and has become more painful.  He denies fevers chills or history of Fournier's gangrene.   Abscess      Home Medications Prior to Admission medications   Medication Sig Start Date End Date Taking? Authorizing Provider  acetaminophen (TYLENOL) 325 MG tablet Take 2 tablets (650 mg total) by mouth every 6 (six) hours as needed. Patient taking differently: Take 650 mg by mouth every 6 (six) hours as needed for mild pain. 08/14/21   Teressa Lower, PA-C  aspirin 81 MG EC tablet Take 1 tablet (81 mg total) by mouth at bedtime. Swallow whole. Patient taking differently: Take 162 mg by mouth at bedtime. 01/20/22   Albertine Grates, MD  atorvastatin (LIPITOR) 80 MG tablet Take 80 mg by mouth daily. 10/03/21   [provider]  Blood Glucose Monitoring Suppl (TRUE METRIX METER) w/Device KIT Use as directed Patient taking differently: 1 each by Other route as directed. 01/02/19   Grayce Sessions, NP  carvedilol (COREG) 25 MG tablet Take 25 mg by mouth 2 (two) times daily. 10/03/21   [provider]  clindamycin (CLEOCIN) 300 MG capsule Take 1 capsule (300 mg total) by mouth 3 (three) times daily. 03/14/23   Gwyneth Sprout, MD  glucose blood (TRUE METRIX BLOOD GLUCOSE TEST) test strip Check blood sugar fasting and before meals and again if pt feels bad (symptoms of hypo). Check sugar three times a day Patient taking differently: 1 each  by Other route See admin instructions. Check blood sugar fasting and before meals and again if pt feels bad (symptoms of hypo). Check sugar three times a day 10/10/19   Elpidio Anis, PA-C  hydrALAZINE (APRESOLINE) 50 MG tablet Take 50 mg by mouth 3 (three) times daily.    [provider]  Insulin Isophane & Regular Human (NOVOLIN 70/30 FLEXPEN RELION) (70-30) 100 UNIT/ML PEN Inject 18 Units into the skin 2 (two) times daily. Patient taking differently: Inject 36 Units into the skin 2 (two) times daily. 10/20/19   Osvaldo Shipper, MD  Insulin Pen Needle (NOVOFINE) 30G X 8 MM MISC Inject 10 each into the skin as needed. Patient taking differently: Inject 1 packet into the skin as needed (insulin). 10/20/19   Osvaldo Shipper, MD  isosorbide mononitrate (IMDUR) 120 MG 24 hr tablet Take 120 mg by mouth daily.    [provider]  nitroGLYCERIN (NITROSTAT) 0.4 MG SL tablet Place 0.4 mg under the tongue every 5 (five) minutes as needed for chest pain. 08/10/21   [provider]  VENTOLIN HFA 108 (90 Base) MCG/ACT inhaler Inhale 1-2 puffs into the lungs daily as needed for wheezing or shortness of breath. 03/02/21   [provider]      Allergies    Lisinopril, Lactose intolerance (gi), and Other    Review of Systems   Review of Systems  Physical Exam Updated Vital Signs BP (!) 180/103 (BP  Location: Right Arm)   Pulse 80   Temp 98.3 F (36.8 C) (Oral)   Resp 15   Ht 5\' 4"  (1.626 m)   Wt 118 kg   SpO2 96%   BMI 44.65 kg/m  Physical Exam Vitals and nursing note reviewed.  Constitutional:      General: He is not in acute distress.    Appearance: He is well-developed. He is not diaphoretic.  HENT:     Head: Normocephalic and atraumatic.  Eyes:     General: No scleral icterus.    Conjunctiva/sclera: Conjunctivae normal.  Cardiovascular:     Rate and Rhythm: Normal rate and regular rhythm.     Heart sounds: Normal heart sounds.  Pulmonary:     Effort:  Pulmonary effort is normal. No respiratory distress.     Breath sounds: Normal breath sounds.  Abdominal:     Palpations: Abdomen is soft.     Tenderness: There is no abdominal tenderness.  Genitourinary:    Comments: Exam chaperoned by by PA Valrie Hart.  Patient has a small fluctuant abscess noted to the left perineal region.  No obvious induration.  No signs of surrounding cellulitis. Musculoskeletal:     Cervical back: Normal range of motion and neck supple.  Skin:    General: Skin is warm and dry.  Neurological:     Mental Status: He is alert.  Psychiatric:        Behavior: Behavior normal.    ED Results / Procedures / Treatments   Labs (all labs ordered are listed, but only abnormal results are displayed) Labs Reviewed  CBC WITH DIFFERENTIAL/PLATELET - Abnormal; Notable for the following components:      Result Value   WBC 11.8 (*)    RBC 3.46 (*)    Hemoglobin 11.4 (*)    HCT 35.3 (*)    MCV 102.0 (*)    Neutro Abs 9.0 (*)    All other components within normal limits  COMPREHENSIVE METABOLIC PANEL - Abnormal; Notable for the following components:   Glucose, Bld 326 (*)    BUN 35 (*)    Creatinine, Ser 4.97 (*)    AST 11 (*)    GFR, Estimated 13 (*)    All other components within normal limits  CBG MONITORING, ED    EKG None  Radiology No results found.  Procedures .Marland KitchenIncision and Drainage  Date/Time: 08/02/2023 8:37 PM  Performed by: Arthor Captain, PA-C Authorized by: Arthor Captain, PA-C   Consent:    Consent obtained:  Verbal   Consent given by:  Patient   Risks discussed:  Bleeding, incomplete drainage, pain, damage to other organs and infection   Alternatives discussed:  No treatment Universal protocol:    Patient identity confirmed:  Arm band Location:    Type:  Abscess   Size:  2cm   Location:  Anogenital   Anogenital location:  Perineum Pre-procedure details:    Skin preparation:  Povidone-iodine Anesthesia:    Anesthesia  method:  Topical application and local infiltration   Topical anesthetic:  LET   Local anesthetic:  Lidocaine 2% WITH epi Procedure type:    Complexity:  Simple Procedure details:    Incision types:  Stab incision   Wound management:  Probed and deloculated   Drainage:  Bloody and purulent   Drainage amount:  Moderate   Wound treatment:  Wound left open   Packing materials:  None Post-procedure details:    Procedure completion:  Tolerated well, no immediate  complications     Medications Ordered in ED Medications  lidocaine-EPINEPHrine (XYLOCAINE W/EPI) 2 %-1:100000 (with pres) injection 20 mL (has no administration in time range)  lidocaine-EPINEPHrine (XYLOCAINE W/EPI) 2 %-1:200000 (PF) injection (has no administration in time range)  lidocaine-EPINEPHrine-tetracaine (LET) topical gel (3 mLs Topical Given 08/02/23 1843)    ED Course/ Medical Decision Making/ A&P                                 Medical Decision Making Amount and/or Complexity of Data Reviewed Labs: ordered.  Risk Prescription drug management.   52 year old male who presents emergency department with recurrent perineal abscess.  Patient's A1c is about 9.  He asked why his having recurrent abscesses.  I discussed that better sugar control would help prevent these types of infections.  Patient understands.  No evidence of Fournier's gangrene or severe cellulitis.  Patient tolerated the procedure profoundly well.  I have given him home care instruction and return precautions.  He appears otherwise appropriate for discharge.  Will give the patient doxycycline.        Final Clinical Impression(s) / ED Diagnoses Final diagnoses:  Perineal abscess    Rx / DC Orders ED Discharge Orders     None         Arthor Captain, PA-C 08/02/23 2044    Virgina Norfolk, DO 08/02/23 2240

## 2023-08-02 NOTE — ED Triage Notes (Signed)
Patient arrives with complaints of an abscess to his groin x1 week. Patient report that abscess did rupture and start draining a few day ago. Pain is a 7/10.    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 MWF Dialysis

## 2023-08-02 NOTE — Discharge Instructions (Signed)
INCISION AND DRAINAGE WOUND CARE   Keep area clean and dry for 24 hours. Do not remove bandage, if applied.  After 24 hours, remove bandage and wash wound gently with mild soap and warm water. Reapply a new bandage after cleaning wound, if directed.  Continue daily cleansing with soap and water   Seek medical careif you experience any of the following signs of infection: Swelling, redness, pus drainage, streaking, fever >101.0 F  Seek care if you experience excessive bleeding that does not stop after 15-20 minutes of constant, firm pressure.

## 2023-08-02 NOTE — ED Notes (Signed)
Pt provided with diet ginger ale at this time.

## 2023-08-02 NOTE — ED Notes (Addendum)
Assisted [place], [place] with I&D. Patient tolerated procedure well.

## 2023-09-09 ENCOUNTER — Ambulatory Visit: Payer: Medicare HMO | Admitting: Podiatry

## 2023-11-11 ENCOUNTER — Emergency Department (HOSPITAL_BASED_OUTPATIENT_CLINIC_OR_DEPARTMENT_OTHER)

## 2023-11-11 ENCOUNTER — Emergency Department (HOSPITAL_COMMUNITY): Admission: EM | Admit: 2023-11-11 | Discharge: 2023-11-11 | Disposition: A | Discharge status: Home / Self Care

## 2023-11-11 ENCOUNTER — Encounter (HOSPITAL_BASED_OUTPATIENT_CLINIC_OR_DEPARTMENT_OTHER): Payer: Self-pay

## 2023-11-11 DIAGNOSIS — I120 Hypertensive chronic kidney disease with stage 5 chronic kidney disease or end stage renal disease: Secondary | ICD-10-CM | POA: Insufficient documentation

## 2023-11-11 DIAGNOSIS — Z7982 Long term (current) use of aspirin: Secondary | ICD-10-CM | POA: Diagnosis not present

## 2023-11-11 DIAGNOSIS — Z794 Long term (current) use of insulin: Secondary | ICD-10-CM | POA: Diagnosis not present

## 2023-11-11 DIAGNOSIS — N492 Inflammatory disorders of scrotum: Secondary | ICD-10-CM | POA: Diagnosis present

## 2023-11-11 DIAGNOSIS — E1165 Type 2 diabetes mellitus with hyperglycemia: Secondary | ICD-10-CM | POA: Insufficient documentation

## 2023-11-11 DIAGNOSIS — Z79899 Other long term (current) drug therapy: Secondary | ICD-10-CM | POA: Insufficient documentation

## 2023-11-11 DIAGNOSIS — N186 End stage renal disease: Secondary | ICD-10-CM | POA: Diagnosis not present

## 2023-11-11 DIAGNOSIS — E1122 Type 2 diabetes mellitus with diabetic chronic kidney disease: Secondary | ICD-10-CM | POA: Insufficient documentation

## 2023-11-11 DIAGNOSIS — Z992 Dependence on renal dialysis: Secondary | ICD-10-CM | POA: Insufficient documentation

## 2023-11-11 LAB — CBC
HCT: 33.7 % — ABNORMAL LOW (ref 39.0–52.0)
Hemoglobin: 11.2 g/dL — ABNORMAL LOW (ref 13.0–17.0)
MCH: 33.2 pg (ref 26.0–34.0)
MCHC: 33.2 g/dL (ref 30.0–36.0)
MCV: 100 fL (ref 80.0–100.0)
Platelets: 212 10*3/uL (ref 150–400)
RBC: 3.37 MIL/uL — ABNORMAL LOW (ref 4.22–5.81)
RDW: 13.2 % (ref 11.5–15.5)
WBC: 12.3 10*3/uL — ABNORMAL HIGH (ref 4.0–10.5)
nRBC: 0 % (ref 0.0–0.2)

## 2023-11-11 LAB — BASIC METABOLIC PANEL
Anion gap: 10 (ref 5–15)
BUN: 31 mg/dL — ABNORMAL HIGH (ref 6–20)
CO2: 26 mmol/L (ref 22–32)
Calcium: 9 mg/dL (ref 8.9–10.3)
Chloride: 98 mmol/L (ref 98–111)
Creatinine, Ser: 5.07 mg/dL — ABNORMAL HIGH (ref 0.61–1.24)
GFR, Estimated: 13 mL/min — ABNORMAL LOW (ref >60)
Glucose, Bld: 338 mg/dL — ABNORMAL HIGH (ref 70–99)
Potassium: 4.3 mmol/L (ref 3.5–5.1)
Sodium: 134 mmol/L — ABNORMAL LOW (ref 135–145)

## 2023-11-11 NOTE — ED Provider Triage Note (Signed)
 Emergency Medicine Provider Triage Evaluation Note  Howard Mcmahon , a 53 y.o. male  was evaluated in triage.  Pt complains of "boil on nuts". Reports began a few days ago. No drainage. No fevers at home. No meds PTA. Left testicle. No dysuria. Hx of the same.   Review of Systems  Positive:  Negative:   Physical Exam  There were no vitals taken for this visit. Gen:   Awake, no distress   Resp:  Normal effort  MSK:   Moves extremities without difficulty  Other:    Medical Decision Making  Medically screening exam initiated at 8:10 PM.  Appropriate orders placed.  Howard Mcmahon was informed that the remainder of the evaluation will be completed by another provider, this initial triage assessment does not replace that evaluation, and the importance of remaining in the ED until their evaluation is complete.     Howard Decant, PA-C 11/11/23 2012

## 2023-11-11 NOTE — ED Triage Notes (Signed)
 Pt c/o "a boil on my nuts" first noticed a couple days ago. When asked about change, "I know it hurts." States abscess is on L testicle, denies urinary symptoms

## 2023-11-12 ENCOUNTER — Emergency Department (HOSPITAL_BASED_OUTPATIENT_CLINIC_OR_DEPARTMENT_OTHER)

## 2023-11-12 ENCOUNTER — Emergency Department (HOSPITAL_BASED_OUTPATIENT_CLINIC_OR_DEPARTMENT_OTHER)
Admission: EM | Admit: 2023-11-12 | Discharge: 2023-11-12 | Disposition: A | Discharge status: Home / Self Care | Attending: Emergency Medicine | Admitting: Emergency Medicine

## 2023-11-12 DIAGNOSIS — N492 Inflammatory disorders of scrotum: Secondary | ICD-10-CM | POA: Diagnosis not present

## 2023-11-12 MED ORDER — LIDOCAINE-EPINEPHRINE (PF) 2 %-1:200000 IJ SOLN
10.0000 mL | Freq: Once | INTRAMUSCULAR | Status: AC
  Administered 2023-11-12: 10 mL via INTRADERMAL
  Filled 2023-11-12: qty 20

## 2023-11-12 MED ORDER — MORPHINE SULFATE (PF) 4 MG/ML IV SOLN
4.0000 mg | Freq: Once | INTRAVENOUS | Status: AC
  Administered 2023-11-12: 4 mg via INTRAMUSCULAR
  Filled 2023-11-12: qty 1

## 2023-11-12 MED ORDER — AMOXICILLIN-POT CLAVULANATE 875-125 MG PO TABS: 1.0000 | ORAL_TABLET | Freq: Two times a day (BID) | ORAL | 0 refills | Status: AC

## 2023-11-12 MED ORDER — HYDROCODONE-ACETAMINOPHEN 5-325 MG PO TABS
1.0000 | ORAL_TABLET | Freq: Once | ORAL | Status: AC
  Administered 2023-11-12: 1 via ORAL
  Filled 2023-11-12: qty 1

## 2023-11-12 MED ORDER — CLINDAMYCIN PHOSPHATE 600 MG/50ML IV SOLN
600.0000 mg | Freq: Once | INTRAVENOUS | Status: AC
  Administered 2023-11-12: 600 mg via INTRAVENOUS
  Filled 2023-11-12: qty 50

## 2023-11-12 NOTE — ED Notes (Signed)
 Pt left via POV for transfer over to [facility], Dr. Posey Rea accepting

## 2023-11-12 NOTE — ED Notes (Signed)
 Pt unable to provide urine at this time

## 2023-11-12 NOTE — ED Provider Notes (Signed)
 [place] EMERGENCY DEPARTMENT AT [facility] Provider Note   CSN: [id number] Arrival date & time: 11/11/23  1957     History  Chief Complaint  Patient presents with   Abscess    Howard Mcmahon is a 53 y.o. male.  Diabetic, hypertension, ESRD on dialysis presenting with "boil on my nuts".  States developed fluid collection and possible abscess to his posterior scrotum about 3 days ago.  Has had it in the past requiring drainage.  No bleeding or drainage currently.  No fever, chills, nausea, vomiting, chest pain or shortness of breath.  Did receive full dialysis session today.  Denies testicular pain.  Stable to urinate. Complains of swelling behind left scrotum.  The history is provided by the patient.  Abscess Associated symptoms: no fever, no headaches, no nausea and no vomiting        Home Medications Prior to Admission medications   Medication Sig Start Date End Date Taking? Authorizing Provider  acetaminophen (TYLENOL) 325 MG tablet Take 2 tablets (650 mg total) by mouth every 6 (six) hours as needed. Patient taking differently: Take 650 mg by mouth every 6 (six) hours as needed for mild pain. 08/14/21   Teressa Lower, PA-C  aspirin 81 MG EC tablet Take 1 tablet (81 mg total) by mouth at bedtime. Swallow whole. Patient taking differently: Take 162 mg by mouth at bedtime. 01/20/22   Albertine Grates, MD  atorvastatin (LIPITOR) 80 MG tablet Take 80 mg by mouth daily. 10/03/21   [provider]  Blood Glucose Monitoring Suppl (TRUE METRIX METER) w/Device KIT Use as directed Patient taking differently: 1 each by Other route as directed. 01/02/19   Grayce Sessions, NP  carvedilol (COREG) 25 MG tablet Take 25 mg by mouth 2 (two) times daily. 10/03/21   [provider]  clindamycin (CLEOCIN) 300 MG capsule Take 1 capsule (300 mg total) by mouth 3 (three) times daily. 03/14/23   Gwyneth Sprout, MD  doxycycline (VIBRAMYCIN) 100 MG capsule Take 1 capsule (100 mg  total) by mouth 2 (two) times daily. 08/02/23   Harris, Abigail, PA-C  glucose blood (TRUE METRIX BLOOD GLUCOSE TEST) test strip Check blood sugar fasting and before meals and again if pt feels bad (symptoms of hypo). Check sugar three times a day Patient taking differently: 1 each by Other route See admin instructions. Check blood sugar fasting and before meals and again if pt feels bad (symptoms of hypo). Check sugar three times a day 10/10/19   Elpidio Anis, PA-C  hydrALAZINE (APRESOLINE) 50 MG tablet Take 50 mg by mouth 3 (three) times daily.    [provider]  Insulin Isophane & Regular Human (NOVOLIN 70/30 FLEXPEN RELION) (70-30) 100 UNIT/ML PEN Inject 18 Units into the skin 2 (two) times daily. Patient taking differently: Inject 36 Units into the skin 2 (two) times daily. 10/20/19   Osvaldo Shipper, MD  Insulin Pen Needle (NOVOFINE) 30G X 8 MM MISC Inject 10 each into the skin as needed. Patient taking differently: Inject 1 packet into the skin as needed (insulin). 10/20/19   Osvaldo Shipper, MD  isosorbide mononitrate (IMDUR) 120 MG 24 hr tablet Take 120 mg by mouth daily.    [provider]  nitroGLYCERIN (NITROSTAT) 0.4 MG SL tablet Place 0.4 mg under the tongue every 5 (five) minutes as needed for chest pain. 08/10/21   [provider]  VENTOLIN HFA 108 (90 Base) MCG/ACT inhaler Inhale 1-2 puffs into the lungs daily as needed for  wheezing or shortness of breath. 03/02/21   [provider]      Allergies    Lisinopril, Lactose intolerance (gi), and Other    Review of Systems   Review of Systems  Constitutional:  Negative for activity change, appetite change and fever.  HENT:  Negative for congestion and rhinorrhea.   Respiratory:  Negative for cough, choking, chest tightness and shortness of breath.   Cardiovascular:  Negative for chest pain.  Gastrointestinal:  Negative for abdominal pain, nausea and vomiting.  Genitourinary:  Negative for dysuria and  hematuria.  Musculoskeletal:  Negative for arthralgias and myalgias.  Skin:  Positive for wound.  Neurological:  Negative for weakness and headaches.   all other systems are negative except as noted in the HPI and PMH.    Physical Exam Updated Vital Signs BP (!) 167/94   Pulse 88   Temp 99 F (37.2 C)   Resp 15   SpO2 99%  Physical Exam Vitals and nursing note reviewed.  Constitutional:      General: He is not in acute distress.    Appearance: He is well-developed.  HENT:     Head: Normocephalic and atraumatic.     Mouth/Throat:     Pharynx: No oropharyngeal exudate.  Eyes:     Conjunctiva/sclera: Conjunctivae normal.     Pupils: Pupils are equal, round, and reactive to light.  Neck:     Comments: No meningismus. Cardiovascular:     Rate and Rhythm: Normal rate and regular rhythm.     Heart sounds: Normal heart sounds. No murmur heard. Pulmonary:     Effort: Pulmonary effort is normal. No respiratory distress.     Breath sounds: Normal breath sounds.  Abdominal:     Palpations: Abdomen is soft.     Tenderness: There is no abdominal tenderness. There is no guarding or rebound.  Genitourinary:    Comments: Tender fluid collection posterior left scrotum, no crepitus.  Testicles are nontender.  No overlying erythema Musculoskeletal:        General: No tenderness. Normal range of motion.     Cervical back: Normal range of motion and neck supple.     Comments: R BKA  Skin:    General: Skin is warm.  Neurological:     Mental Status: He is alert and oriented to person, place, and time.     Cranial Nerves: No cranial nerve deficit.     Motor: No abnormal muscle tone.     Coordination: Coordination normal.     Comments:  5/5 strength throughout. CN 2-12 intact.Equal grip strength.   Psychiatric:        Behavior: Behavior normal.     ED Results / Procedures / Treatments   Labs (all labs ordered are listed, but only abnormal results are displayed) Labs Reviewed  CBC -  Abnormal; Notable for the following components:      Result Value   WBC 12.3 (*)    RBC 3.37 (*)    Hemoglobin 11.2 (*)    HCT 33.7 (*)    All other components within normal limits  BASIC METABOLIC PANEL - Abnormal; Notable for the following components:   Sodium 134 (*)    Glucose, Bld 338 (*)    BUN 31 (*)    Creatinine, Ser 5.07 (*)    GFR, Estimated 13 (*)    All other components within normal limits  URINALYSIS, ROUTINE W REFLEX MICROSCOPIC    EKG None  Radiology CT ABDOMEN PELVIS  WO CONTRAST Result Date: 11/12/2023 CLINICAL DATA:  Perineal pain. Mass posterior to the scrotum. Possible abscess. EXAM: CT ABDOMEN AND PELVIS WITHOUT CONTRAST TECHNIQUE: Multidetector CT imaging of the abdomen and pelvis was performed following the standard protocol without IV contrast. RADIATION DOSE REDUCTION: This exam was performed according to the departmental dose-optimization program which includes automated exposure control, adjustment of the mA and/or kV according to patient size and/or use of iterative reconstruction technique. COMPARISON:  Scrotal ultrasound 11/11/2023 and CT abdomen and pelvis 11/10/2022 FINDINGS: Lower chest: No acute abnormality. Hepatobiliary: Unremarkable liver. Cholelithiasis. No biliary dilation. Pancreas: Unremarkable. Spleen: Unremarkable. Adrenals/Urinary Tract: Normal adrenal glands. No urinary calculi or hydronephrosis. Bladder is unremarkable. Stomach/Bowel: Normal caliber large and small bowel. No bowel wall thickening. The appendix is normal.Stomach is within normal limits. Vascular/Lymphatic: No significant vascular findings are present. No enlarged abdominal or pelvic lymph nodes. Reproductive: Circumscribed low-density collection in the posterior left scrotum measures 5.7 x 3.8 cm corresponding to the abnormality seen on scrotal ultrasound 11/11/2023. Adjacent inflammatory stranding and fluid. No soft tissue gas. Other: No free intraperitoneal fluid or air.  Musculoskeletal: No acute fracture. IMPRESSION: Circumscribed low-density collection in the posterior left scrotum measures 5.7 x 3.8 cm. Adjacent inflammatory stranding and fluid. Findings are nonspecific but concerning for abscess. No soft tissue gas. Electronically Signed   By: Minerva Fester M.D.   On: 11/12/2023 03:09   US SCROTUM W/DOPPLER Result Date: 11/12/2023 CLINICAL DATA:  Testicle mass EXAM: SCROTAL ULTRASOUND DOPPLER ULTRASOUND OF THE TESTICLES TECHNIQUE: Complete ultrasound examination of the testicles, epididymis, and other scrotal structures was performed. Color and spectral Doppler ultrasound were also utilized to evaluate blood flow to the testicles. COMPARISON:  CT 11/10/2022, ultrasound 08/18/2012 FINDINGS: Right testicle Measurements: 1.9 x 1.3 x 1.5 cm. No mass or microlithiasis visualized. Left testicle Measurements: 1.8 x 0.9 x 1.6 cm. No mass or microlithiasis visualized. Right epididymis:  Normal in size and appearance. Left epididymis:  Normal in size and appearance. Hydrocele:  None visualized. Varicocele:  None visualized. Pulsed Doppler interrogation of both testes demonstrates normal low resistance arterial and venous waveforms bilaterally. Heterogenous avascular mass posterior to the scrotum, within the perineal region, this measures 5.7 x 2.9 x 3 cm IMPRESSION: 1. Negative for testicular torsion or intratesticular mass 2. Heterogenous 5.7 cm avascular mass posterior to the scrotum in the perineal region. Differential considerations include hematoma, as well as infected or inflammatory fluid collection, solid mass felt less likely given lack of vascularity. Recommend correlation with direct inspection, if there are symptoms suggestive of inflammation or infection, contrast-enhanced CT could be obtained to further evaluate Electronically Signed   By: Jasmine Pang M.D.   On: 11/12/2023 00:41    Procedures Procedures    Medications Ordered in ED Medications   HYDROcodone-acetaminophen (NORCO/VICODIN) 5-325 MG per tablet 1 tablet (has no administration in time range)    ED Course/ Medical Decision Making/ A&P                                 Medical Decision Making Amount and/or Complexity of Data Reviewed Labs: ordered. Decision-making details documented in ED Course. Radiology: ordered and independent interpretation performed. Decision-making details documented in ED Course. ECG/medicine tests: ordered and independent interpretation performed. Decision-making details documented in ED Course.  Risk Prescription drug management.   Patient with diabetes, ESRD on dialysis presenting with possible abscess behind left scrotum.  No fever.  No crepitus felt.  Does not appear to be toxic or septic.  Ultrasound obtained in triage shows nondistinct fluid collection posterior to left testicle.  Labs are reassuring with slight leukocytosis.  Potassium is within normal limits.  Vital signs remained stable.  Initiated IV antibiotics.  Imaging concerning for large scrotal abscess near perineum but not involving testicle itself.  No soft tissue gas to suggest Fournier's gangrene.  No crepitus.  Labs with hyperglycemia without DKA.  Discussed with urology Dr. Jennette Dubin who believes this is likely amenable to bedside drainage but will evaluate patient at [facility].  Patient comfortable driving himself to [facility].  Discussed with Dr. Posey Rea who accepts patient.         Final Clinical Impression(s) / ED Diagnoses Final diagnoses:  Scrotal abscess    Rx / DC Orders ED Discharge Orders     None         Armoni Kludt, Jeannett Senior, MD 11/12/23 [phone]

## 2023-11-12 NOTE — Procedures (Signed)
 Scrotal I&D Procedure Note  Indications: 53 y.o. male with scrotal abscess  Pre-operative Diagnosis: Urinary retention  Post-operative Diagnosis: Same  Surgeon: Zettie Pho, MD  Assistants: None  Procedure Details  Patient was placed in the supine position, prepped with Betadine and draped in the usual sterile fashion.  2% lidocaine with epinephrine was injected circumferentially around the indurated portion of the scrotum.  An 11 blade scalpel was then used to sharply incise the scrotum.  The incision was approximately 2 cm.  Immediate purulent fluid was expressed, approximately 30 cc in total.  The wound was irrigated with normal saline.  Adhesions were broken up digitally with probing of the pocket.  A swab was sent for culture.  Wet Kerlix was packed into the incision.  Hemostasis was adequate at the end of the procedure               Complications: None; patient tolerated the procedure well.  Plan:   1.  Patient will need to do wet-to-dry dressings once daily.  Patient was instructed on how to change the dressing 2.  Recommend discharge with antibiotics for 2 weeks given scrotal abscess.  Recommend Augmentin twice daily 3.  Urology will schedule follow-up in the next 1 to 2 weeks       Attending Attestation: Dr. Cordella Register was available.  Roby Lofts, MD Resident Physician [facility]

## 2023-11-12 NOTE — Consult Note (Signed)
 Urology Consult   Physician requesting consult: Dr. Estelle June  Reason for consult: Scrotal abscess  History of Present Illness: Howard Mcmahon is a 53 y.o. past medical history significant for CKD on dialysis, diabetes, hypertension, peripheral vascular disease, and left lower extremity amputation who presents with inferior scrotal pain x 3 days.  Patient states that this has happened before, and required an drainage of a perineal abscess approximately 1 year ago.  He states he has had increasing pain, but no fevers or chills.  He presented to the emergency department for further evaluation.  He was transferred to the [facility] emergency department for further evaluation by urology given concern for bedside I&D.  He denies a history of voiding or storage urinary symptoms, hematuria, UTIs, STDs, urolithiasis, GU malignancy/trauma/surgery.  Past Medical History:  Diagnosis Date   Chronic kidney disease    on dialysis   Diabetes mellitus without complication (HCC)    Hypertension    Macular infarction    2012   MI (myocardial infarction) (HCC)    Peripheral vascular disease (HCC)    Neuropathy   Pneumonia     Past Surgical History:  Procedure Laterality Date   CARDIAC SURGERY     CORONARY ANGIOPLASTY WITH STENT PLACEMENT     CORONARY ARTERY BYPASS GRAFT     HIP ARTHROPLASTY Right     Current Hospital Medications:  Home Meds:  No current facility-administered medications on file prior to encounter.   Current Outpatient Medications on File Prior to Encounter  Medication Sig Dispense Refill   acetaminophen (TYLENOL) 325 MG tablet Take 2 tablets (650 mg total) by mouth every 6 (six) hours as needed. (Patient taking differently: Take 650 mg by mouth every 6 (six) hours as needed for mild pain.) 20 tablet 0   aspirin 81 MG EC tablet Take 1 tablet (81 mg total) by mouth at bedtime. Swallow whole. (Patient taking differently: Take 162 mg by mouth at bedtime.) 30 tablet 11    atorvastatin (LIPITOR) 80 MG tablet Take 80 mg by mouth daily.     Blood Glucose Monitoring Suppl (TRUE METRIX METER) w/Device KIT Use as directed (Patient taking differently: 1 each by Other route as directed.) 1 kit 0   carvedilol (COREG) 25 MG tablet Take 25 mg by mouth 2 (two) times daily.     clindamycin (CLEOCIN) 300 MG capsule Take 1 capsule (300 mg total) by mouth 3 (three) times daily. 21 capsule 0   doxycycline (VIBRAMYCIN) 100 MG capsule Take 1 capsule (100 mg total) by mouth 2 (two) times daily. 14 capsule 0   glucose blood (TRUE METRIX BLOOD GLUCOSE TEST) test strip Check blood sugar fasting and before meals and again if pt feels bad (symptoms of hypo). Check sugar three times a day (Patient taking differently: 1 each by Other route See admin instructions. Check blood sugar fasting and before meals and again if pt feels bad (symptoms of hypo). Check sugar three times a day) 100 each 12   hydrALAZINE (APRESOLINE) 50 MG tablet Take 50 mg by mouth 3 (three) times daily.     Insulin Isophane & Regular Human (NOVOLIN 70/30 FLEXPEN RELION) (70-30) 100 UNIT/ML PEN Inject 18 Units into the skin 2 (two) times daily. (Patient taking differently: Inject 36 Units into the skin 2 (two) times daily.) 15 mL 1   Insulin Pen Needle (NOVOFINE) 30G X 8 MM MISC Inject 10 each into the skin as needed. (Patient taking differently: Inject 1 packet into the skin as  needed (insulin).) 3 packet 1   isosorbide mononitrate (IMDUR) 120 MG 24 hr tablet Take 120 mg by mouth daily.     nitroGLYCERIN (NITROSTAT) 0.4 MG SL tablet Place 0.4 mg under the tongue every 5 (five) minutes as needed for chest pain.     VENTOLIN HFA 108 (90 Base) MCG/ACT inhaler Inhale 1-2 puffs into the lungs daily as needed for wheezing or shortness of breath.       Scheduled Meds: Continuous Infusions: PRN Meds:.  Allergies:  Allergies  Allergen Reactions   Lisinopril Swelling   Lactose Intolerance (Gi)     Upset stomach    Other  Diarrhea    Lactulose intolerant Lactulose intolerant    Family History  Problem Relation Age of Onset   Stroke Mother     Social History:  reports that he has quit smoking. His smoking use included cigarettes. He has a 60 pack-year smoking history. He uses smokeless tobacco. He reports that he does not drink alcohol and does not use drugs.  ROS: A complete review of systems was performed.  All systems are negative except for pertinent findings as noted.  Physical Exam:  Vital signs in last 24 hours: Temp:  [98.3 F (36.8 C)-99 F (37.2 C)] 98.9 F (37.2 C) (03/04 0833) Pulse Rate:  [74-98] 98 (03/04 0802) Resp:  [15-18] 18 (03/04 0802) BP: (153-180)/(81-94) 168/90 (03/04 0802) SpO2:  [93 %-100 %] 97 % (03/04 0802) Physical Exam:  Neuro: AAOx4 Resp: Non-labored breathing on RA Abd: Appropriately tender, soft, non-distended. Incisions c/d/I GU: bilateral mildly atrophic testicles palpated in superior portion of scrotum. Inferior scrotum indurated and fluctuant, moderately TTP.    Laboratory Data:  Recent Labs    11/11/23 2013  WBC 12.3*  HGB 11.2*  HCT 33.7*  PLT 212    Recent Labs    11/11/23 2013  NA 134*  K 4.3  CL 98  GLUCOSE 338*  BUN 31*  CALCIUM 9.0  CREATININE 5.07*     Results for orders placed or performed during the hospital encounter of 11/12/23 (from the past 24 hours)  CBC     Status: Abnormal   Collection Time: 11/11/23  8:13 PM  Result Value Ref Range   WBC 12.3 (H) 4.0 - 10.5 K/uL   RBC 3.37 (L) 4.22 - 5.81 MIL/uL   Hemoglobin 11.2 (L) 13.0 - 17.0 g/dL   HCT 40.9 (L) 81.1 - 91.4 %   MCV 100.0 80.0 - 100.0 fL   MCH 33.2 26.0 - 34.0 pg   MCHC 33.2 30.0 - 36.0 g/dL   RDW 78.2 95.6 - 21.3 %   Platelets 212 150 - 400 K/uL   nRBC 0.0 0.0 - 0.2 %  Basic metabolic panel     Status: Abnormal   Collection Time: 11/11/23  8:13 PM  Result Value Ref Range   Sodium 134 (L) 135 - 145 mmol/L   Potassium 4.3 3.5 - 5.1 mmol/L   Chloride 98 98 -  111 mmol/L   CO2 26 22 - 32 mmol/L   Glucose, Bld 338 (H) 70 - 99 mg/dL   BUN 31 (H) 6 - 20 mg/dL   Creatinine, Ser 0.86 (H) 0.61 - 1.24 mg/dL   Calcium 9.0 8.9 - 57.8 mg/dL   GFR, Estimated 13 (L) >60 mL/min   Anion gap 10 5 - 15   No results found for this or any previous visit (from the past 240 hours).  Renal Function: Recent Labs    11/11/23  2013  CREATININE 5.07*   CrCl cannot be calculated (Unknown ideal weight.).  Radiologic Imaging: CT ABDOMEN PELVIS WO CONTRAST Result Date: 11/12/2023 CLINICAL DATA:  Perineal pain. Mass posterior to the scrotum. Possible abscess. EXAM: CT ABDOMEN AND PELVIS WITHOUT CONTRAST TECHNIQUE: Multidetector CT imaging of the abdomen and pelvis was performed following the standard protocol without IV contrast. RADIATION DOSE REDUCTION: This exam was performed according to the departmental dose-optimization program which includes automated exposure control, adjustment of the mA and/or kV according to patient size and/or use of iterative reconstruction technique. COMPARISON:  Scrotal ultrasound 11/11/2023 and CT abdomen and pelvis 11/10/2022 FINDINGS: Lower chest: No acute abnormality. Hepatobiliary: Unremarkable liver. Cholelithiasis. No biliary dilation. Pancreas: Unremarkable. Spleen: Unremarkable. Adrenals/Urinary Tract: Normal adrenal glands. No urinary calculi or hydronephrosis. Bladder is unremarkable. Stomach/Bowel: Normal caliber large and small bowel. No bowel wall thickening. The appendix is normal.Stomach is within normal limits. Vascular/Lymphatic: No significant vascular findings are present. No enlarged abdominal or pelvic lymph nodes. Reproductive: Circumscribed low-density collection in the posterior left scrotum measures 5.7 x 3.8 cm corresponding to the abnormality seen on scrotal ultrasound 11/11/2023. Adjacent inflammatory stranding and fluid. No soft tissue gas. Other: No free intraperitoneal fluid or air. Musculoskeletal: No acute fracture.  IMPRESSION: Circumscribed low-density collection in the posterior left scrotum measures 5.7 x 3.8 cm. Adjacent inflammatory stranding and fluid. Findings are nonspecific but concerning for abscess. No soft tissue gas. Electronically Signed   By: Minerva Fester M.D.   On: 11/12/2023 03:09   US SCROTUM W/DOPPLER Result Date: 11/12/2023 CLINICAL DATA:  Testicle mass EXAM: SCROTAL ULTRASOUND DOPPLER ULTRASOUND OF THE TESTICLES TECHNIQUE: Complete ultrasound examination of the testicles, epididymis, and other scrotal structures was performed. Color and spectral Doppler ultrasound were also utilized to evaluate blood flow to the testicles. COMPARISON:  CT 11/10/2022, ultrasound 08/18/2012 FINDINGS: Right testicle Measurements: 1.9 x 1.3 x 1.5 cm. No mass or microlithiasis visualized. Left testicle Measurements: 1.8 x 0.9 x 1.6 cm. No mass or microlithiasis visualized. Right epididymis:  Normal in size and appearance. Left epididymis:  Normal in size and appearance. Hydrocele:  None visualized. Varicocele:  None visualized. Pulsed Doppler interrogation of both testes demonstrates normal low resistance arterial and venous waveforms bilaterally. Heterogenous avascular mass posterior to the scrotum, within the perineal region, this measures 5.7 x 2.9 x 3 cm IMPRESSION: 1. Negative for testicular torsion or intratesticular mass 2. Heterogenous 5.7 cm avascular mass posterior to the scrotum in the perineal region. Differential considerations include hematoma, as well as infected or inflammatory fluid collection, solid mass felt less likely given lack of vascularity. Recommend correlation with direct inspection, if there are symptoms suggestive of inflammation or infection, contrast-enhanced CT could be obtained to further evaluate Electronically Signed   By: Jasmine Pang M.D.   On: 11/12/2023 00:41    I independently reviewed the above imaging studies.  Impression/Recommendation 69 YOM with history of HTN, LLE  amputation, and ESRD on dialysis and prior perineal abscess who presents with inferior scrotal pain x 3 days, imaging consistent with scrotal abscess.  Abscess was successfully incised and drained at bedside in the emergency department and packed with wet-to-dry dressing. Please see procedure note for full details.  Testicles were located more superiorly in the scrotum, and were well away from the scrotal abscess site.  Suspect that recurrent perineal abscesses is likely related to his poor diabetes control.  Recommending continue close monitoring and working with his primary care physicians to help ensure improved glucose control, good hygiene,  and weight control to help prevent future recurrences.  Recommendations:  -Augmentin 875 mg BID x 12 days -daily wet to dry dressings explained to pt at bedside - pt amenable to self changes at home -Urology will arrange follow-up in clinic in 1-2 weeks  Zettie Pho 11/12/2023, 8:43 AM    Roby Lofts, MD Resident Physician [facility]

## 2023-11-12 NOTE — ED Provider Notes (Signed)
  Physical Exam  BP (!) 168/90   Pulse 98   Temp 98.9 F (37.2 C) (Oral)   Resp 18   SpO2 97%   Physical Exam Vitals and nursing note reviewed.  HENT:     Head: Normocephalic and atraumatic.  Eyes:     Pupils: Pupils are equal, round, and reactive to light.  Cardiovascular:     Rate and Rhythm: Normal rate and regular rhythm.  Pulmonary:     Effort: Pulmonary effort is normal.     Breath sounds: Normal breath sounds.  Abdominal:     Palpations: Abdomen is soft.     Tenderness: There is no abdominal tenderness.  Skin:    General: Skin is warm and dry.  Neurological:     Mental Status: He is alert.  Psychiatric:        Mood and Affect: Mood normal.     Procedures  Procedures  ED Course / MDM   Clinical Course as of 11/12/23 0825  Tue Nov 12, 2023  0821 Dr.Frisbie has evaluated patient here in the ED and performed incision and drainage of scrotal abscess.  Patient tolerated procedure well.  He recommends 2 weeks of oral antibiotics (Augmentin) and wet to dry packing with dressing changes daily.  Patient will follow-up with urology in the office. [MP]    Clinical Course User Index [MP] Royanne Foots, DO   Medical Decision Making I, Estelle June DO, have assumed care of this patient from the previous provider pending urology evaluation of scrotal abscess and disposition  Amount and/or Complexity of Data Reviewed Labs: ordered. Radiology: ordered.  Risk Prescription drug management.          Royanne Foots, DO 11/12/23 [phone]

## 2023-11-12 NOTE — ED Provider Notes (Signed)
  Physical Exam  BP (!) 168/90   Pulse 98   Temp 98.9 F (37.2 C)   Resp 18   SpO2 97%   Physical Exam Constitutional:      General: He is not in acute distress.    Appearance: Normal appearance.  HENT:     Head: Normocephalic and atraumatic.     Nose: No congestion or rhinorrhea.  Eyes:     General:        Right eye: No discharge.        Left eye: No discharge.     Extraocular Movements: Extraocular movements intact.     Pupils: Pupils are equal, round, and reactive to light.  Cardiovascular:     Rate and Rhythm: Normal rate and regular rhythm.     Heart sounds: No murmur heard. Pulmonary:     Effort: No respiratory distress.     Breath sounds: No wheezing or rales.  Abdominal:     General: There is no distension.     Tenderness: There is no abdominal tenderness.  Genitourinary:    Comments: Perineal swelling Musculoskeletal:        General: Normal range of motion.     Cervical back: Normal range of motion.  Skin:    General: Skin is warm and dry.  Neurological:     General: No focal deficit present.     Mental Status: He is alert.    Procedures  Procedures  ED Course / MDM   Clinical Course as of 11/12/23 1927  Tue Nov 12, 2023  0821 Dr.Frisbie has evaluated patient here in the ED and performed incision and drainage of scrotal abscess.  Patient tolerated procedure well.  He recommends 2 weeks of oral antibiotics (Augmentin) and wet to dry packing with dressing changes daily.  Patient will follow-up with urology in the office. [MP]    Clinical Course User Index [MP] Royanne Foots, DO   Medical Decision Making Amount and/or Complexity of Data Reviewed Labs: ordered. Radiology: ordered.  Risk Prescription drug management.   Patient transferred from a outside for a perineal abscess.  Spoke with urologist on-call who will come to evaluate patient.  Pending urology evaluation at time of signout       Glendora Score, MD 11/12/23 [phone]

## 2023-11-12 NOTE — Discharge Instructions (Addendum)
 You were seen in the emergency department for an abscess on the scrotum The abscess was drained by Dr.Frisbie who is a urologist You should follow-up with a urologist in the office for reevaluation within the next week You received a dose of antibiotics while in the emergency department Please pick up the prescribed antibiotics from your pharmacy and begin taking as directed for the next 2 weeks Take Tylenol or Motrin as directed for pain and discomfort As discussed, pack your wound and change the dressing daily until seen by urology Return to the Emergency Department for severe pain, fevers or any other concerns

## 2023-11-15 LAB — AEROBIC CULTURE W GRAM STAIN (SUPERFICIAL SPECIMEN)

## 2023-11-16 ENCOUNTER — Telehealth (HOSPITAL_BASED_OUTPATIENT_CLINIC_OR_DEPARTMENT_OTHER): Payer: Self-pay | Admitting: *Deleted

## 2023-11-16 NOTE — Telephone Encounter (Signed)
 Post ED Visit - Positive Culture Follow-up  Culture report reviewed by antimicrobial stewardship pharmacist: [facility] []  Enzo Bi, Pharm.D. []  Celedonio Miyamoto, Pharm.D., BCPS AQ-ID []  Garvin Fila, Pharm.D., BCPS []  Georgina Pillion, Pharm.D., BCPS []  [place], [place].D., BCPS, AAHIVP []  Estella Husk, Pharm.D., BCPS, AAHIVP []  Lysle Pearl, PharmD, BCPS []  Phillips Climes, PharmD, BCPS []  Agapito Games, PharmD, BCPS []  Verlan Friends, PharmD []  Mervyn Gay, PharmD, BCPS []  Vinnie Level, PharmD  [facility] []  Len Childs, PharmD []  Greer Pickerel, PharmD []  Adalberto Cole, PharmD []  Perlie Gold, Rph []  Lonell Face) Jean Rosenthal, PharmD []  Earl Many, PharmD []  Junita Push, PharmD []  Dorna Leitz, PharmD []  Terrilee Files, PharmD []  Lynann Beaver, PharmD []  Keturah Barre, PharmD []  Loralee Pacas, PharmD [x]  Cherylin Mylar, PharmD   Positive aerobic culture Treated with Amoxicillin-Pot Clavulanate, and no further patient follow-up is required at this time.  Howard Mcmahon 11/16/2023, 11:30 AM

## 2024-01-30 ENCOUNTER — Encounter: Payer: Self-pay | Admitting: Podiatry

## 2024-01-30 ENCOUNTER — Ambulatory Visit (INDEPENDENT_AMBULATORY_CARE_PROVIDER_SITE_OTHER): Admitting: Podiatry

## 2024-01-30 DIAGNOSIS — E1169 Type 2 diabetes mellitus with other specified complication: Secondary | ICD-10-CM

## 2024-01-30 DIAGNOSIS — B351 Tinea unguium: Secondary | ICD-10-CM

## 2024-01-30 DIAGNOSIS — M79674 Pain in right toe(s): Secondary | ICD-10-CM

## 2024-01-30 NOTE — Progress Notes (Signed)
This patient presents to the office with chief complaint of long thick nails and diabetic right foot. This patient  says there  is  no pain and discomfort in his right foot..  This patient says there are long thick painful nails right foot.  These nails are painful walking and wearing shoes.  Patient has history of amputation left leg.  Amputation third toe right foot.  Patient is unable to  self treat his own nails . This patient presents  to the office today for treatment of the  long nails and a foot evaluation due to history of  diabetes.  General Appearance  Alert, conversant and in no acute stress.  Vascular  Dorsalis pedis and posterior tibial  pulses are diminished/absent  right foot..  Capillary return is within normal limits. Temperature is within normal limits .  Neurologic  Senn-Weinstein monofilament wire test absent right foot. Muscle power within normal limits bilaterally.  Nails Thick disfigured discolored nails with subungual debris  from 1,2,4 and 5 right foot. No evidence of bacterial infection or drainage bilaterally.  Orthopedic  No limitations of motion of motion feet .  No crepitus or effusions noted.  No bony pathology or digital deformities noted. Amputation left leg as well 3rd toe right foot.  Skin  normotropic skin with no porokeratosis noted bilaterally.  No signs of infections or ulcers noted.     Onychomycosis  right foot.  Diabetes with angiopathy and absent LOPS  right foot.  IE  Debride nails x 5.   A diabetic foot exam was performed and there is evidence of  both vascular or neurologic pathology.   RTC 3 months.   Helane Gunther DPM

## 2024-08-02 ENCOUNTER — Encounter (HOSPITAL_BASED_OUTPATIENT_CLINIC_OR_DEPARTMENT_OTHER): Payer: Self-pay

## 2024-08-02 ENCOUNTER — Emergency Department (HOSPITAL_BASED_OUTPATIENT_CLINIC_OR_DEPARTMENT_OTHER)
Admission: EM | Admit: 2024-08-02 | Discharge: 2024-08-02 | Disposition: A | Discharge status: Home / Self Care | Attending: Emergency Medicine | Admitting: Emergency Medicine

## 2024-08-02 ENCOUNTER — Other Ambulatory Visit: Payer: Self-pay

## 2024-08-02 DIAGNOSIS — E1122 Type 2 diabetes mellitus with diabetic chronic kidney disease: Secondary | ICD-10-CM | POA: Insufficient documentation

## 2024-08-02 DIAGNOSIS — Z7982 Long term (current) use of aspirin: Secondary | ICD-10-CM | POA: Diagnosis not present

## 2024-08-02 DIAGNOSIS — L02215 Cutaneous abscess of perineum: Secondary | ICD-10-CM | POA: Diagnosis present

## 2024-08-02 DIAGNOSIS — I2510 Atherosclerotic heart disease of native coronary artery without angina pectoris: Secondary | ICD-10-CM | POA: Insufficient documentation

## 2024-08-02 DIAGNOSIS — Z794 Long term (current) use of insulin: Secondary | ICD-10-CM | POA: Insufficient documentation

## 2024-08-02 DIAGNOSIS — N189 Chronic kidney disease, unspecified: Secondary | ICD-10-CM | POA: Diagnosis not present

## 2024-08-02 DIAGNOSIS — Z992 Dependence on renal dialysis: Secondary | ICD-10-CM | POA: Insufficient documentation

## 2024-08-02 DIAGNOSIS — Z79899 Other long term (current) drug therapy: Secondary | ICD-10-CM | POA: Insufficient documentation

## 2024-08-02 DIAGNOSIS — I129 Hypertensive chronic kidney disease with stage 1 through stage 4 chronic kidney disease, or unspecified chronic kidney disease: Secondary | ICD-10-CM | POA: Diagnosis not present

## 2024-08-02 LAB — CBC WITH DIFFERENTIAL/PLATELET
Abs Immature Granulocytes: 0.05 K/uL (ref 0.00–0.07)
Basophils Absolute: 0 K/uL (ref 0.0–0.1)
Basophils Relative: 0 %
Eosinophils Absolute: 0.2 K/uL (ref 0.0–0.5)
Eosinophils Relative: 2 %
HCT: 35 % — ABNORMAL LOW (ref 39.0–52.0)
Hemoglobin: 11.6 g/dL — ABNORMAL LOW (ref 13.0–17.0)
Immature Granulocytes: 1 %
Lymphocytes Relative: 14 %
Lymphs Abs: 1.3 K/uL (ref 0.7–4.0)
MCH: 33.9 pg (ref 26.0–34.0)
MCHC: 33.1 g/dL (ref 30.0–36.0)
MCV: 102.3 fL — ABNORMAL HIGH (ref 80.0–100.0)
Monocytes Absolute: 0.5 K/uL (ref 0.1–1.0)
Monocytes Relative: 5 %
Neutro Abs: 7.5 K/uL (ref 1.7–7.7)
Neutrophils Relative %: 78 %
Platelets: 196 K/uL (ref 150–400)
RBC: 3.42 MIL/uL — ABNORMAL LOW (ref 4.22–5.81)
RDW: 14 % (ref 11.5–15.5)
WBC: 9.6 K/uL (ref 4.0–10.5)
nRBC: 0 % (ref 0.0–0.2)

## 2024-08-02 LAB — BASIC METABOLIC PANEL WITH GFR
Anion gap: 12 (ref 5–15)
BUN: 16 mg/dL (ref 6–20)
CO2: 26 mmol/L (ref 22–32)
Calcium: 9.8 mg/dL (ref 8.9–10.3)
Chloride: 101 mmol/L (ref 98–111)
Creatinine, Ser: 4.05 mg/dL — ABNORMAL HIGH (ref 0.61–1.24)
GFR, Estimated: 17 mL/min — ABNORMAL LOW (ref >60)
Glucose, Bld: 223 mg/dL — ABNORMAL HIGH (ref 70–99)
Potassium: 3.7 mmol/L (ref 3.5–5.1)
Sodium: 139 mmol/L (ref 135–145)

## 2024-08-02 MED ORDER — LIDOCAINE-EPINEPHRINE 1 %-1:100000 IJ SOLN
20.0000 mL | Freq: Once | INTRAMUSCULAR | Status: AC
  Administered 2024-08-02: 20 mL
  Filled 2024-08-02: qty 1

## 2024-08-02 MED ORDER — DOXYCYCLINE HYCLATE 100 MG PO CAPS: 100.0000 mg | ORAL_CAPSULE | Freq: Two times a day (BID) | ORAL | 0 refills | Status: DC

## 2024-08-02 NOTE — ED Notes (Signed)
 Underwear and pad given, with some nonstick pads and tape to take home, for wound care needs. Pt understood directions

## 2024-08-02 NOTE — ED Provider Notes (Signed)
 [place] EMERGENCY DEPARTMENT AT [facility] Provider Note   CSN: [id number] Arrival date & time: 08/02/24  1309     Patient presents with: Abscess   Howard Mcmahon is a 53 y.o. male with past history of type 2 diabetes, hypertension, CAD, hyperlipidemia, limb ischemia, left BKA, CKD, MI, who presents to the emergency department for evaluation of perineal abscess.  Patient reports he has had an abscess present in his perineum for the last 2 weeks.  He reported it was draining initially last week, but it stopped draining a week ago.  Patient denies fever, chills, body aches or any other systemic symptoms.  He does report pain in his perineum.  No additional complaints at this time.  Patient does receive dialysis on Monday Wednesday Friday.  Over, patient missed his dialysis on Friday and had his most recent session today.  Patient also has a prescription for Keflex  that was given to him during his most recent hospitalization in September 2025.This patient asked what this medication was as he did not know what it was for and therefore he has not been taking it.  Abscess      Prior to Admission medications   Medication Sig Start Date End Date Taking? Authorizing Provider  doxycycline  (VIBRAMYCIN ) 100 MG capsule Take 1 capsule (100 mg total) by mouth 2 (two) times daily. 08/02/24  Yes Lyndell Allaire, Marry RAMAN, PA-C  acetaminophen  (TYLENOL ) 325 MG tablet Take 2 tablets (650 mg total) by mouth every 6 (six) hours as needed. Patient taking differently: Take 650 mg by mouth every 6 (six) hours as needed for mild pain. 08/14/21   Theotis Cameron HERO, PA-C  aspirin  81 MG EC tablet Take 1 tablet (81 mg total) by mouth at bedtime. Swallow whole. Patient taking differently: Take 162 mg by mouth at bedtime. 01/20/22   Jerri Keys, MD  atorvastatin  (LIPITOR) 80 MG tablet Take 80 mg by mouth daily. 10/03/21   [provider]  Blood Glucose Monitoring Suppl (TRUE METRIX METER) w/Device KIT Use as  directed Patient taking differently: 1 each by Other route as directed. 01/02/19   Celestia Rosaline SQUIBB, NP  carvedilol  (COREG ) 25 MG tablet Take 25 mg by mouth 2 (two) times daily. 10/03/21   [provider]  clindamycin  (CLEOCIN ) 300 MG capsule Take 1 capsule (300 mg total) by mouth 3 (three) times daily. 03/14/23   Doretha Folks, MD  glucose blood (TRUE METRIX BLOOD GLUCOSE TEST) test strip Check blood sugar fasting and before meals and again if pt feels bad (symptoms of hypo). Check sugar three times a day Patient taking differently: 1 each by Other route See admin instructions. Check blood sugar fasting and before meals and again if pt feels bad (symptoms of hypo). Check sugar three times a day 10/10/19   Odell Balls, PA-C  hydrALAZINE  (APRESOLINE ) 50 MG tablet Take 50 mg by mouth 3 (three) times daily.    [provider]  Insulin  Isophane & Regular Human (NOVOLIN  70/30 FLEXPEN RELION) (70-30) 100 UNIT/ML PEN Inject 18 Units into the skin 2 (two) times daily. Patient taking differently: Inject 36 Units into the skin 2 (two) times daily. 10/20/19   Krishnan, Gokul, MD  Insulin  Pen Needle (NOVOFINE) 30G X 8 MM MISC Inject 10 each into the skin as needed. Patient taking differently: Inject 1 packet into the skin as needed (insulin ). 10/20/19   Krishnan, Gokul, MD  isosorbide  mononitrate (IMDUR ) 120 MG 24 hr tablet Take 120 mg by mouth daily.  [provider]  nitroGLYCERIN  (NITROSTAT ) 0.4 MG SL tablet Place 0.4 mg under the tongue every 5 (five) minutes as needed for chest pain. 08/10/21   [provider]  VENTOLIN  HFA 108 (90 Base) MCG/ACT inhaler Inhale 1-2 puffs into the lungs daily as needed for wheezing or shortness of breath. 03/02/21   [provider]    Allergies: Lisinopril , Lactose intolerance (gi), and Other    Review of Systems  Skin:  Positive for wound.    Updated Vital Signs BP (!) 164/98 (BP Location: Right Arm)   Pulse (!) 106    Temp 98 F (36.7 C) (Temporal)   Resp 16   Ht 6' 4 (1.93 m)   Wt 107.5 kg   SpO2 97%   BMI 28.85 kg/m   Physical Exam Vitals and nursing note reviewed.  Constitutional:      Appearance: Normal appearance.  HENT:     Head: Normocephalic and atraumatic.     Mouth/Throat:     Mouth: Mucous membranes are moist.  Eyes:     General: No scleral icterus.       Right eye: No discharge.        Left eye: No discharge.     Conjunctiva/sclera: Conjunctivae normal.  Cardiovascular:     Rate and Rhythm: Normal rate and regular rhythm.     Pulses: Normal pulses.  Pulmonary:     Effort: Pulmonary effort is normal.     Breath sounds: Normal breath sounds.  Abdominal:     General: There is no distension.     Tenderness: There is no abdominal tenderness.  Genitourinary:    Comments: Patient with approximately 3 cm perineal abscess noted.  Center abscess is fluctuant with areas of induration laterally.  Patient is tender to palpation in this area. Musculoskeletal:        General: No deformity.     Cervical back: Normal range of motion.  Skin:    General: Skin is warm and dry.     Capillary Refill: Capillary refill takes less than 2 seconds.  Neurological:     Mental Status: He is alert.     Motor: No weakness.  Psychiatric:        Mood and Affect: Mood normal.     (all labs ordered are listed, but only abnormal results are displayed) Labs Reviewed  CBC WITH DIFFERENTIAL/PLATELET - Abnormal; Notable for the following components:      Result Value   RBC 3.42 (*)    Hemoglobin 11.6 (*)    HCT 35.0 (*)    MCV 102.3 (*)    All other components within normal limits  BASIC METABOLIC PANEL WITH GFR - Abnormal; Notable for the following components:   Glucose, Bld 223 (*)    Creatinine, Ser 4.05 (*)    GFR, Estimated 17 (*)    All other components within normal limits    EKG: None  Radiology: No results found.  .Incision and Drainage  Date/Time: 08/02/2024 2:03  PM  Performed by: Torrence Marry RAMAN, PA-C Authorized by: Torrence Marry RAMAN, PA-C   Consent:    Consent obtained:  Verbal   Consent given by:  Patient   Risks, benefits, and alternatives were discussed: yes     Risks discussed:  Bleeding, incomplete drainage and pain   Alternatives discussed:  No treatment Universal protocol:    Procedure explained and questions answered to patient or proxy's satisfaction: yes     Patient identity confirmed:  Verbally with  patient Location:    Type:  Abscess   Location:  Anogenital   Anogenital location:  Perineum Pre-procedure details:    Skin preparation:  Chlorhexidine  with alcohol Sedation:    Sedation type:  None Anesthesia:    Anesthesia method:  Local infiltration   Local anesthetic:  Lidocaine  1% WITH epi Procedure type:    Complexity:  Simple Procedure details:    Ultrasound guidance: no     Needle aspiration: no     Incision types:  Single straight   Incision depth:  Submucosal   Wound management:  Probed and deloculated, irrigated with saline and debrided   Drainage:  Bloody and purulent   Drainage amount:  Scant   Wound treatment:  Wound left open   Packing materials:  None Post-procedure details:    Procedure completion:  Tolerated well, no immediate complications    Medications Ordered in the ED  lidocaine -EPINEPHrine  (XYLOCAINE  W/EPI) 1 %-1:100000 (with pres) injection 20 mL (20 mLs Infiltration Given by Other 08/02/24 1336)                                  Medical Decision Making Amount and/or Complexity of Data Reviewed Labs: ordered.  Risk Prescription drug management.   This patient presents to the ED for concern of perineal abscess, this involves an extensive number of treatment options, and is a complaint that carries with it a high risk of complications and morbidity.   Differential diagnosis includes: Infection, necrotizing fasciitis, folliculitis, hidradenitis suppurativa  Co morbidities:  diabetes,  hypertension, CAD, history of MI, PVD, CKD on dialysis   Additional history:  Patient has a history of prior abscesses in the past.  He has had abscesses in his perineum that required drainage.  Lab Tests:  I Ordered, and personally interpreted labs.  The pertinent results include:    - Glucose: 223  Imaging Studies:  Not indicated  Cardiac Monitoring/ECG:  The patient was maintained on a cardiac monitor.  I personally viewed and interpreted the cardiac monitored which showed an underlying rhythm of: Tachycardia  Medicines ordered and prescription drug management:  I ordered medication including  Medications  lidocaine -EPINEPHrine  (XYLOCAINE  W/EPI) 1 %-1:100000 (with pres) injection 20 mL (20 mLs Infiltration Given by Other 08/02/24 1336)   for topical numbing agent Reevaluation of the patient after these medicines showed that the patient improved I have reviewed the patients home medicines and have made adjustments as needed  Test Considered:   none  Critical Interventions:   none  Consultations Obtained: None  Problem List / ED Course:     ICD-10-CM   1. Abscess of perineum  L02.215       MDM: 79-year-old male who presents emergency department for evaluation of perineum abscess.  Patient reports abscess has been present for the last 2 weeks and was initially draining on its own.  However, over the last week drainage has since stopped and it is causing the patient pain.  Patient is a diabetic on hemodialysis, Monday Wednesday Friday.  He did miss dialysis on Friday and most recently went earlier today.  On physical exam, abscess is fluctuant in the center with some induration laterally.  Patient is agreeable to abscess drainage.  I did order basic labs to assess for systemic infection due to patient's diabetes.  Lab work was unremarkable.  No leukocytosis on CBC.  Patient tolerated incision and drainage procedure well.  Please see procedure  note for further detail.   I did inform patient that it is normal to have continued bleeding over the next 1 to 2 days.  I educated the patient on the importance of keeping the area clean.  I am also sending the patient home with a 10-day prescription for doxycycline .  I informed patient that it is important to take all of his medication as prescribed, even if he feels better.  He verbalizes understanding to this.  Patient's vital signs are stable.  He is appropriate for discharge at this time.   Dispostion:  After consideration of the diagnostic results and the patients response to treatment, I feel that the patient would benefit from outpatient antibiotics and PCP follow-up for worsening symptoms.   Final diagnoses:  Abscess of perineum    ED Discharge Orders          Ordered    doxycycline  (VIBRAMYCIN ) 100 MG capsule  2 times daily        08/02/24 1405               Tanaiya Kolarik S, PA-C 08/02/24 1414    Bernard Drivers, MD 08/04/24 [phone]

## 2024-08-02 NOTE — ED Triage Notes (Signed)
 Pt reports a boil on testicles for x2 weeks. Pt denies any fever or chills. Pt reports it was draining but is not currently.

## 2024-08-02 NOTE — Discharge Instructions (Addendum)
 It was a pleasure taking care of you today. You were seen in the Emergency Department for an abscess in your perineum. Your work-up was reassuring. Your lab workup was unremarkable.  You do not have an elevated white blood cell count, which can indicate inflammation.  I was able to drain your abscess.  You had some pus and blood drained.  It is normal to see continued drainage for the next 1 to 2 days.  I am sending you home with a prescription for doxycycline , which is an antibiotic.  You will need to take this antibiotic 2 times a day for the next 10 days.  It is important to take all scheduled doses of this medication, even if you start to feel better.  Refer to the attached documentation for further management of your symptoms.  Please return to the emergency department if you develop worsening symptoms such as continued pain in the area, fever, chills, body aches or nausea and vomiting.

## 2024-08-02 NOTE — ED Notes (Signed)
 Patient given discharge instructions. Questions were answered. Patient verbalized understanding of discharge instructions and care at home.

## 2024-09-20 ENCOUNTER — Other Ambulatory Visit: Payer: Self-pay

## 2024-09-20 ENCOUNTER — Encounter (HOSPITAL_BASED_OUTPATIENT_CLINIC_OR_DEPARTMENT_OTHER): Payer: Self-pay | Admitting: *Deleted

## 2024-09-20 ENCOUNTER — Emergency Department (HOSPITAL_BASED_OUTPATIENT_CLINIC_OR_DEPARTMENT_OTHER)
Admission: EM | Admit: 2024-09-20 | Discharge: 2024-09-20 | Disposition: A | Discharge status: Home / Self Care | Attending: Emergency Medicine | Admitting: Emergency Medicine

## 2024-09-20 ENCOUNTER — Emergency Department (HOSPITAL_BASED_OUTPATIENT_CLINIC_OR_DEPARTMENT_OTHER): Admitting: Radiology

## 2024-09-20 DIAGNOSIS — M546 Pain in thoracic spine: Secondary | ICD-10-CM | POA: Diagnosis present

## 2024-09-20 DIAGNOSIS — T148XXA Other injury of unspecified body region, initial encounter: Secondary | ICD-10-CM

## 2024-09-20 DIAGNOSIS — Z7982 Long term (current) use of aspirin: Secondary | ICD-10-CM | POA: Insufficient documentation

## 2024-09-20 MED ORDER — METAXALONE 800 MG PO TABS: 800.0000 mg | ORAL_TABLET | Freq: Three times a day (TID) | ORAL | 0 refills | Status: AC

## 2024-09-20 MED ORDER — LIDOCAINE 5 % EX PTCH: 1.0000 | MEDICATED_PATCH | CUTANEOUS | 0 refills | Status: DC

## 2024-09-20 MED ORDER — LIDOCAINE 5 % EX PTCH: 1.0000 | MEDICATED_PATCH | CUTANEOUS | Status: DC

## 2024-09-20 MED ORDER — LIDOCAINE 5 % EX PTCH
1.0000 | MEDICATED_PATCH | CUTANEOUS | 0 refills | Status: DC
  Filled 2024-09-20: qty 30, 30d supply, fill #0

## 2024-09-20 MED ORDER — METAXALONE 800 MG PO TABS
800.0000 mg | ORAL_TABLET | Freq: Three times a day (TID) | ORAL | 0 refills | Status: DC
  Filled 2024-09-20: qty 21, 7d supply, fill #0

## 2024-09-20 MED ORDER — LIDOCAINE 5 % EX PTCH
1.0000 | MEDICATED_PATCH | CUTANEOUS | Status: DC
  Administered 2024-09-20: 1 via TRANSDERMAL
  Filled 2024-09-20: qty 1

## 2024-09-20 NOTE — ED Provider Notes (Signed)
 " [place] EMERGENCY DEPARTMENT AT [facility] Provider Note   CSN: [id number] Arrival date & time: 09/20/24  1650     Patient presents with: Chest Pain   Howard Mcmahon is a 54 y.o. male.   54 year old male presents with right-sided back pain x 2 days.  Patient describes the pain as sharp and worse with certain positions.  He denies any rashes.  Pain starts at his mid thoracic back and does radiate into his axilla.  No history of trauma.  Has been using Tylenol  with temporary relief denies any shortness of breath.  No fever or chills.  No cough congestion.       Prior to Admission medications  Medication Sig Start Date End Date Taking? Authorizing Provider  acetaminophen  (TYLENOL ) 325 MG tablet Take 2 tablets (650 mg total) by mouth every 6 (six) hours as needed. Patient taking differently: Take 650 mg by mouth every 6 (six) hours as needed for mild pain. 08/14/21   Theotis Cameron HERO, PA-C  aspirin  81 MG EC tablet Take 1 tablet (81 mg total) by mouth at bedtime. Swallow whole. Patient taking differently: Take 162 mg by mouth at bedtime. 01/20/22   Jerri Keys, MD  atorvastatin  (LIPITOR) 80 MG tablet Take 80 mg by mouth daily. 10/03/21   [provider]  Blood Glucose Monitoring Suppl (TRUE METRIX METER) w/Device KIT Use as directed Patient taking differently: 1 each by Other route as directed. 01/02/19   Celestia Rosaline SQUIBB, NP  carvedilol  (COREG ) 25 MG tablet Take 25 mg by mouth 2 (two) times daily. 10/03/21   [provider]  clindamycin  (CLEOCIN ) 300 MG capsule Take 1 capsule (300 mg total) by mouth 3 (three) times daily. 03/14/23   Doretha Folks, MD  doxycycline  (VIBRAMYCIN ) 100 MG capsule Take 1 capsule (100 mg total) by mouth 2 (two) times daily. 08/02/24   Dufour, Marry RAMAN, PA-C  glucose blood (TRUE METRIX BLOOD GLUCOSE TEST) test strip Check blood sugar fasting and before meals and again if pt feels bad (symptoms of hypo). Check sugar three times a  day Patient taking differently: 1 each by Other route See admin instructions. Check blood sugar fasting and before meals and again if pt feels bad (symptoms of hypo). Check sugar three times a day 10/10/19   Odell Balls, PA-C  hydrALAZINE  (APRESOLINE ) 50 MG tablet Take 50 mg by mouth 3 (three) times daily.    [provider]  Insulin  Isophane & Regular Human (NOVOLIN  70/30 FLEXPEN RELION) (70-30) 100 UNIT/ML PEN Inject 18 Units into the skin 2 (two) times daily. Patient taking differently: Inject 36 Units into the skin 2 (two) times daily. 10/20/19   Krishnan, Gokul, MD  Insulin  Pen Needle (NOVOFINE) 30G X 8 MM MISC Inject 10 each into the skin as needed. Patient taking differently: Inject 1 packet into the skin as needed (insulin ). 10/20/19   Krishnan, Gokul, MD  isosorbide  mononitrate (IMDUR ) 120 MG 24 hr tablet Take 120 mg by mouth daily.    [provider]  nitroGLYCERIN  (NITROSTAT ) 0.4 MG SL tablet Place 0.4 mg under the tongue every 5 (five) minutes as needed for chest pain. 08/10/21   [provider]  VENTOLIN  HFA 108 (90 Base) MCG/ACT inhaler Inhale 1-2 puffs into the lungs daily as needed for wheezing or shortness of breath. 03/02/21   [provider]    Allergies: Lisinopril , Lactose intolerance (gi), and Other    Review of Systems  All other systems reviewed and are negative.  Updated Vital Signs BP (!) 209/107 (BP Location: Right Arm)   Pulse 88   Temp 98.5 F (36.9 C) (Oral)   Resp 20   SpO2 99%   Physical Exam Vitals and nursing note reviewed.  Constitutional:      General: He is not in acute distress.    Appearance: Normal appearance. He is well-developed. He is not toxic-appearing.  HENT:     Head: Normocephalic and atraumatic.  Eyes:     General: Lids are normal.     Conjunctiva/sclera: Conjunctivae normal.     Pupils: Pupils are equal, round, and reactive to light.  Neck:     Thyroid: No thyroid mass.     Trachea: No tracheal  deviation.  Cardiovascular:     Rate and Rhythm: Normal rate and regular rhythm.     Heart sounds: Normal heart sounds. No murmur heard.    No gallop.  Pulmonary:     Effort: Pulmonary effort is normal. No respiratory distress.     Breath sounds: Normal breath sounds. No stridor. No decreased breath sounds, wheezing, rhonchi or rales.  Abdominal:     General: There is no distension.     Palpations: Abdomen is soft.     Tenderness: There is no abdominal tenderness. There is no rebound.  Musculoskeletal:        General: No tenderness. Normal range of motion.     Cervical back: Normal range of motion and neck supple.       Back:  Skin:    General: Skin is warm and dry.     Findings: No abrasion or rash.  Neurological:     Mental Status: He is alert and oriented to person, place, and time. Mental status is at baseline.     GCS: GCS eye subscore is 4. GCS verbal subscore is 5. GCS motor subscore is 6.     Cranial Nerves: No cranial nerve deficit.     Sensory: No sensory deficit.     Motor: Motor function is intact.  Psychiatric:        Attention and Perception: Attention normal.        Speech: Speech normal.        Behavior: Behavior normal.     (all labs ordered are listed, but only abnormal results are displayed) Labs Reviewed - No data to display  EKG: EKG Interpretation Date/Time:  Sunday September 20 2024 17:04:22 EST Ventricular Rate:  85 PR Interval:  172 QRS Duration:  97 QT Interval:  376 QTC Calculation: 448 R Axis:   14  Text Interpretation: Sinus rhythm Confirmed by Dasie Faden ([id number]) on 09/20/2024 5:11:14 PM  Radiology: No results found.   Procedures   Medications Ordered in the ED - No data to display                                  Medical Decision Making Amount and/or Complexity of Data Reviewed Radiology: ordered.   Patient's EKG shows normal sinus rhythm.  Patient with reproducible MSK pain to right thoracic pain.  No rashes noted.   Suspect muscle skeletal etiology.  No concern for ACS or PE.  Patient given lidocaine  patch here and will give prescription for same.  Will also give muscle relaxants.  Return precautions given     Final diagnoses:  None    ED Discharge Orders     None  Dasie Faden, MD 09/20/24 1712  "

## 2024-09-20 NOTE — ED Triage Notes (Signed)
 Pt is here for pain in chest with radiation to back.  Pt denies any sob.

## 2024-09-21 ENCOUNTER — Other Ambulatory Visit: Payer: Self-pay

## 2024-10-04 ENCOUNTER — Other Ambulatory Visit: Payer: Self-pay

## 2024-10-04 ENCOUNTER — Encounter (HOSPITAL_COMMUNITY): Payer: Self-pay

## 2024-10-04 ENCOUNTER — Emergency Department (HOSPITAL_COMMUNITY)

## 2024-10-04 ENCOUNTER — Observation Stay (HOSPITAL_COMMUNITY)
Admission: EM | Admit: 2024-10-04 | Discharge: 2024-10-06 | Disposition: A | Attending: Internal Medicine | Admitting: Internal Medicine

## 2024-10-04 DIAGNOSIS — E1122 Type 2 diabetes mellitus with diabetic chronic kidney disease: Secondary | ICD-10-CM | POA: Insufficient documentation

## 2024-10-04 DIAGNOSIS — I340 Nonrheumatic mitral (valve) insufficiency: Secondary | ICD-10-CM | POA: Diagnosis not present

## 2024-10-04 DIAGNOSIS — J9601 Acute respiratory failure with hypoxia: Principal | ICD-10-CM | POA: Insufficient documentation

## 2024-10-04 DIAGNOSIS — Z992 Dependence on renal dialysis: Secondary | ICD-10-CM | POA: Diagnosis not present

## 2024-10-04 DIAGNOSIS — I5043 Acute on chronic combined systolic (congestive) and diastolic (congestive) heart failure: Secondary | ICD-10-CM | POA: Diagnosis not present

## 2024-10-04 DIAGNOSIS — R7989 Other specified abnormal findings of blood chemistry: Secondary | ICD-10-CM | POA: Diagnosis not present

## 2024-10-04 DIAGNOSIS — Z7682 Awaiting organ transplant status: Secondary | ICD-10-CM | POA: Diagnosis not present

## 2024-10-04 DIAGNOSIS — I160 Hypertensive urgency: Secondary | ICD-10-CM | POA: Diagnosis not present

## 2024-10-04 DIAGNOSIS — I2510 Atherosclerotic heart disease of native coronary artery without angina pectoris: Secondary | ICD-10-CM | POA: Insufficient documentation

## 2024-10-04 DIAGNOSIS — Z7982 Long term (current) use of aspirin: Secondary | ICD-10-CM | POA: Insufficient documentation

## 2024-10-04 DIAGNOSIS — J441 Chronic obstructive pulmonary disease with (acute) exacerbation: Secondary | ICD-10-CM | POA: Diagnosis not present

## 2024-10-04 DIAGNOSIS — Z87891 Personal history of nicotine dependence: Secondary | ICD-10-CM | POA: Insufficient documentation

## 2024-10-04 DIAGNOSIS — Z79899 Other long term (current) drug therapy: Secondary | ICD-10-CM | POA: Insufficient documentation

## 2024-10-04 DIAGNOSIS — E785 Hyperlipidemia, unspecified: Secondary | ICD-10-CM | POA: Insufficient documentation

## 2024-10-04 DIAGNOSIS — R0602 Shortness of breath: Secondary | ICD-10-CM | POA: Diagnosis present

## 2024-10-04 DIAGNOSIS — I132 Hypertensive heart and chronic kidney disease with heart failure and with stage 5 chronic kidney disease, or end stage renal disease: Secondary | ICD-10-CM | POA: Insufficient documentation

## 2024-10-04 DIAGNOSIS — R000 Tachycardia, unspecified: Secondary | ICD-10-CM | POA: Diagnosis not present

## 2024-10-04 DIAGNOSIS — E8779 Other fluid overload: Secondary | ICD-10-CM

## 2024-10-04 DIAGNOSIS — Z794 Long term (current) use of insulin: Secondary | ICD-10-CM | POA: Insufficient documentation

## 2024-10-04 DIAGNOSIS — N186 End stage renal disease: Secondary | ICD-10-CM | POA: Insufficient documentation

## 2024-10-04 HISTORY — DX: Chronic viral hepatitis B without delta-agent: B18.1

## 2024-10-04 HISTORY — DX: End stage renal disease: N18.6

## 2024-10-04 LAB — COMPREHENSIVE METABOLIC PANEL WITH GFR
ALT: 10 U/L (ref 0–44)
AST: 23 U/L (ref 15–41)
Albumin: 3.5 g/dL (ref 3.5–5.0)
Alkaline Phosphatase: 67 U/L (ref 38–126)
Anion gap: 13 (ref 5–15)
BUN: 39 mg/dL — ABNORMAL HIGH (ref 6–20)
CO2: 23 mmol/L (ref 22–32)
Calcium: 8.6 mg/dL — ABNORMAL LOW (ref 8.9–10.3)
Chloride: 108 mmol/L (ref 98–111)
Creatinine, Ser: 6.62 mg/dL — ABNORMAL HIGH (ref 0.61–1.24)
GFR, Estimated: 9 mL/min — ABNORMAL LOW
Glucose, Bld: 128 mg/dL — ABNORMAL HIGH (ref 70–99)
Potassium: 4.1 mmol/L (ref 3.5–5.1)
Sodium: 144 mmol/L (ref 135–145)
Total Bilirubin: 0.5 mg/dL (ref 0.0–1.2)
Total Protein: 7.3 g/dL (ref 6.5–8.1)

## 2024-10-04 LAB — CBC WITH DIFFERENTIAL/PLATELET
Abs Immature Granulocytes: 0.05 10*3/uL (ref 0.00–0.07)
Basophils Absolute: 0 10*3/uL (ref 0.0–0.1)
Basophils Relative: 0 %
Eosinophils Absolute: 0.4 10*3/uL (ref 0.0–0.5)
Eosinophils Relative: 3 %
HCT: 37.5 % — ABNORMAL LOW (ref 39.0–52.0)
Hemoglobin: 11.9 g/dL — ABNORMAL LOW (ref 13.0–17.0)
Immature Granulocytes: 0 %
Lymphocytes Relative: 21 %
Lymphs Abs: 2.4 10*3/uL (ref 0.7–4.0)
MCH: 33.8 pg (ref 26.0–34.0)
MCHC: 31.7 g/dL (ref 30.0–36.0)
MCV: 106.5 fL — ABNORMAL HIGH (ref 80.0–100.0)
Monocytes Absolute: 0.6 10*3/uL (ref 0.1–1.0)
Monocytes Relative: 5 %
Neutro Abs: 8 10*3/uL — ABNORMAL HIGH (ref 1.7–7.7)
Neutrophils Relative %: 71 %
Platelets: 209 10*3/uL (ref 150–400)
RBC: 3.52 MIL/uL — ABNORMAL LOW (ref 4.22–5.81)
RDW: 14.1 % (ref 11.5–15.5)
WBC: 11.4 10*3/uL — ABNORMAL HIGH (ref 4.0–10.5)
nRBC: 0 % (ref 0.0–0.2)

## 2024-10-04 LAB — CBG MONITORING, ED
Glucose-Capillary: 253 mg/dL — ABNORMAL HIGH (ref 70–99)
Glucose-Capillary: 291 mg/dL — ABNORMAL HIGH (ref 70–99)

## 2024-10-04 LAB — RESP PANEL BY RT-PCR (RSV, FLU A&B, COVID)  RVPGX2
Influenza A by PCR: NEGATIVE
Influenza B by PCR: NEGATIVE
Resp Syncytial Virus by PCR: NEGATIVE
SARS Coronavirus 2 by RT PCR: NEGATIVE

## 2024-10-04 LAB — TROPONIN T, HIGH SENSITIVITY
Troponin T High Sensitivity: 56 ng/L — ABNORMAL HIGH (ref 0–19)
Troponin T High Sensitivity: 56 ng/L — ABNORMAL HIGH (ref 0–19)

## 2024-10-04 MED ORDER — ACETAMINOPHEN 650 MG RE SUPP: 650.0000 mg | Freq: Four times a day (QID) | RECTAL | Status: DC | PRN

## 2024-10-04 MED ORDER — FUROSEMIDE 10 MG/ML IJ SOLN
60.0000 mg | Freq: Once | INTRAMUSCULAR | Status: AC
  Administered 2024-10-04: 60 mg via INTRAVENOUS
  Filled 2024-10-04: qty 6

## 2024-10-04 MED ORDER — HYDRALAZINE HCL 25 MG PO TABS
75.0000 mg | ORAL_TABLET | Freq: Once | ORAL | Status: AC
  Administered 2024-10-04: 75 mg via ORAL
  Filled 2024-10-04: qty 3

## 2024-10-04 MED ORDER — ISOSORBIDE MONONITRATE ER 60 MG PO TB24
120.0000 mg | ORAL_TABLET | Freq: Every day | ORAL | Status: DC
  Administered 2024-10-04 – 2024-10-06 (×2): 120 mg via ORAL
  Filled 2024-10-04: qty 2
  Filled 2024-10-04: qty 4

## 2024-10-04 MED ORDER — INSULIN ASPART 100 UNIT/ML IJ SOLN
0.0000 [IU] | Freq: Every day | INTRAMUSCULAR | Status: DC
  Administered 2024-10-04: 3 [IU] via SUBCUTANEOUS
  Administered 2024-10-05: 2 [IU] via SUBCUTANEOUS
  Filled 2024-10-04: qty 2
  Filled 2024-10-04: qty 3
  Filled 2024-10-04: qty 2

## 2024-10-04 MED ORDER — INSULIN ASPART PROT & ASPART (70-30 MIX) 100 UNIT/ML ~~LOC~~ SUSP
18.0000 [IU] | Freq: Two times a day (BID) | SUBCUTANEOUS | Status: DC
  Administered 2024-10-04 – 2024-10-06 (×4): 18 [IU] via SUBCUTANEOUS
  Filled 2024-10-04 (×2): qty 10

## 2024-10-04 MED ORDER — DOXYCYCLINE HYCLATE 100 MG PO TABS
100.0000 mg | ORAL_TABLET | Freq: Two times a day (BID) | ORAL | Status: DC
  Administered 2024-10-04 – 2024-10-06 (×4): 100 mg via ORAL
  Filled 2024-10-04 (×4): qty 1

## 2024-10-04 MED ORDER — ATORVASTATIN CALCIUM 80 MG PO TABS
80.0000 mg | ORAL_TABLET | Freq: Every day | ORAL | Status: DC
  Administered 2024-10-05 – 2024-10-06 (×2): 80 mg via ORAL
  Filled 2024-10-04 (×2): qty 1

## 2024-10-04 MED ORDER — ONDANSETRON HCL 4 MG/2ML IJ SOLN: 4.0000 mg | Freq: Four times a day (QID) | INTRAMUSCULAR | Status: DC | PRN

## 2024-10-04 MED ORDER — METAXALONE 800 MG PO TABS
800.0000 mg | ORAL_TABLET | Freq: Three times a day (TID) | ORAL | Status: DC
  Administered 2024-10-04 – 2024-10-06 (×4): 800 mg via ORAL
  Filled 2024-10-04 (×9): qty 1

## 2024-10-04 MED ORDER — CARVEDILOL 12.5 MG PO TABS
25.0000 mg | ORAL_TABLET | Freq: Once | ORAL | Status: AC
  Administered 2024-10-04: 25 mg via ORAL
  Filled 2024-10-04: qty 2

## 2024-10-04 MED ORDER — SODIUM CHLORIDE 0.9% FLUSH
3.0000 mL | Freq: Two times a day (BID) | INTRAVENOUS | Status: DC
  Administered 2024-10-05 – 2024-10-06 (×3): 3 mL via INTRAVENOUS

## 2024-10-04 MED ORDER — ASPIRIN 81 MG PO TBEC
81.0000 mg | DELAYED_RELEASE_TABLET | Freq: Every day | ORAL | Status: DC
  Administered 2024-10-04 – 2024-10-05 (×2): 81 mg via ORAL
  Filled 2024-10-04 (×2): qty 1

## 2024-10-04 MED ORDER — ACETAMINOPHEN 325 MG PO TABS
650.0000 mg | ORAL_TABLET | Freq: Four times a day (QID) | ORAL | Status: DC | PRN
  Administered 2024-10-05: 650 mg via ORAL
  Filled 2024-10-04: qty 2

## 2024-10-04 MED ORDER — CARVEDILOL 12.5 MG PO TABS
25.0000 mg | ORAL_TABLET | Freq: Two times a day (BID) | ORAL | Status: DC
  Administered 2024-10-04: 25 mg via ORAL
  Filled 2024-10-04: qty 2

## 2024-10-04 MED ORDER — PREDNISONE 20 MG PO TABS
40.0000 mg | ORAL_TABLET | Freq: Every day | ORAL | Status: DC
  Administered 2024-10-05 – 2024-10-06 (×2): 40 mg via ORAL
  Filled 2024-10-04 (×2): qty 2

## 2024-10-04 MED ORDER — AMLODIPINE BESYLATE 5 MG PO TABS
5.0000 mg | ORAL_TABLET | Freq: Once | ORAL | Status: AC
  Administered 2024-10-04: 5 mg via ORAL
  Filled 2024-10-04: qty 1

## 2024-10-04 MED ORDER — NITROGLYCERIN 0.4 MG SL SUBL: 0.4000 mg | SUBLINGUAL_TABLET | SUBLINGUAL | Status: DC | PRN

## 2024-10-04 MED ORDER — HEPARIN SODIUM (PORCINE) 5000 UNIT/ML IJ SOLN
5000.0000 [IU] | Freq: Three times a day (TID) | INTRAMUSCULAR | Status: DC
  Administered 2024-10-04 – 2024-10-05 (×3): 5000 [IU] via SUBCUTANEOUS
  Filled 2024-10-04 (×4): qty 1

## 2024-10-04 MED ORDER — INSULIN ASPART 100 UNIT/ML IJ SOLN
0.0000 [IU] | Freq: Three times a day (TID) | INTRAMUSCULAR | Status: DC
  Administered 2024-10-05: 2 [IU] via SUBCUTANEOUS
  Filled 2024-10-04: qty 2

## 2024-10-04 MED ORDER — ONDANSETRON HCL 4 MG PO TABS: 4.0000 mg | ORAL_TABLET | Freq: Four times a day (QID) | ORAL | Status: DC | PRN

## 2024-10-04 MED ORDER — IPRATROPIUM-ALBUTEROL 0.5-2.5 (3) MG/3ML IN SOLN
3.0000 mL | Freq: Four times a day (QID) | RESPIRATORY_TRACT | Status: DC
  Administered 2024-10-04 – 2024-10-05 (×3): 3 mL via RESPIRATORY_TRACT
  Filled 2024-10-04 (×3): qty 3

## 2024-10-04 NOTE — H&P (Signed)
 " History and Physical    Howard Mcmahon FMW:[id number] DOB: 1971-04-04 DOA: 10/04/2024  PCP: [facility]  Patient coming from: Home  I have personally briefly reviewed patient's old medical records in [facility]  Chief Complaint: Shortness of breath  HPI: Howard Mcmahon is a 54 y.o. male with medical history significant of ESRD on dialysis MWF, type 2 diabetes, hypertension, CAD, limb ischemia s/p right SFA to PT bypass with GSV on 05/20/2024, left BKA and self reported COPD who was brought into the ED by EMS due to shortness of breath.  Cording to patient, the symptoms started this morning, they are associated with dry cough.  Has a history of smoking but very remotely, he quit several years ago.  Denies any passive smoking either.  Does not use home oxygen.  Does not have an require any inhalers, last time he used them was several years ago as well.  Claims to be very compliant with his dialysis, received last dialysis on Friday.  Denies any sick contact.  Denies any fever, chills, sweating, chest pain, any problem with urination or with bowel movement.  Despite of being dialysis patient, he still makes urine.  Reportedly, patient was given a dose of IV Solu-Medrol  and 2 rounds of DuoNeb inhalers which helped the patient feel better.  He was requiring almost 10 L of oxygen however he has been weaned to 3 L of oxygen.  When I saw patient, he says that he is feeling much better.  He appears very comfortable, able to speak in full sentences.  He did not take his morning medications for blood pressure.  ED Course: Upon arrival to ED, his blood pressure was significantly elevated, peaked at 200/105.  Patient received all his home antihypertensive medications including amlodipine , Coreg  and hydralazine  in the ED, blood pressure improved.  BMP shows CKD at baseline without any electrolyte abnormalities.  CBC shows anemia at his baseline, mild leukocytosis, patient afebrile in the ED.  Tested  negative for RSV, influenza and COVID.  Troponins checked x 2 both of them at 56.  Hospitalist were called for admission.  Review of Systems: As per HPI otherwise negative.    Past Medical History:  Diagnosis Date   Chronic kidney disease    on dialysis   Diabetes mellitus without complication (HCC)    Hypertension    Macular infarction    2012   MI (myocardial infarction) (HCC)    Peripheral vascular disease    Neuropathy   Pneumonia     Past Surgical History:  Procedure Laterality Date   CARDIAC SURGERY     CORONARY ANGIOPLASTY WITH STENT PLACEMENT     CORONARY ARTERY BYPASS GRAFT     HIP ARTHROPLASTY Right      reports that he has quit smoking. His smoking use included cigarettes. He has a 60 pack-year smoking history. He uses smokeless tobacco. He reports that he does not drink alcohol and does not use drugs.  Allergies[1]  Family History  Problem Relation Age of Onset   Stroke Mother     Prior to Admission medications  Medication Sig Start Date End Date Taking? Authorizing Provider  acetaminophen  (TYLENOL ) 325 MG tablet Take 2 tablets (650 mg total) by mouth every 6 (six) hours as needed. Patient taking differently: Take 650 mg by mouth every 6 (six) hours as needed for mild pain. 08/14/21   Theotis Cameron HERO, PA-C  aspirin  81 MG EC tablet Take 1 tablet (81 mg total) by mouth  at bedtime. Swallow whole. Patient taking differently: Take 162 mg by mouth at bedtime. 01/20/22   Jerri Keys, MD  atorvastatin  (LIPITOR) 80 MG tablet Take 80 mg by mouth daily. 10/03/21   [provider]  Blood Glucose Monitoring Suppl (TRUE METRIX METER) w/Device KIT Use as directed Patient taking differently: 1 each by Other route as directed. 01/02/19   Celestia Rosaline SQUIBB, NP  carvedilol  (COREG ) 25 MG tablet Take 25 mg by mouth 2 (two) times daily. 10/03/21   [provider]  clindamycin  (CLEOCIN ) 300 MG capsule Take 1 capsule (300 mg total) by mouth 3 (three) times daily.  03/14/23   Doretha Folks, MD  doxycycline  (VIBRAMYCIN ) 100 MG capsule Take 1 capsule (100 mg total) by mouth 2 (two) times daily. 08/02/24   Dufour, Marry RAMAN, PA-C  glucose blood (TRUE METRIX BLOOD GLUCOSE TEST) test strip Check blood sugar fasting and before meals and again if pt feels bad (symptoms of hypo). Check sugar three times a day Patient taking differently: 1 each by Other route See admin instructions. Check blood sugar fasting and before meals and again if pt feels bad (symptoms of hypo). Check sugar three times a day 10/10/19   Odell Balls, PA-C  hydrALAZINE  (APRESOLINE ) 50 MG tablet Take 50 mg by mouth 3 (three) times daily.    [provider]  Insulin  Isophane & Regular Human (NOVOLIN  70/30 FLEXPEN RELION) (70-30) 100 UNIT/ML PEN Inject 18 Units into the skin 2 (two) times daily. Patient taking differently: Inject 36 Units into the skin 2 (two) times daily. 10/20/19   Krishnan, Gokul, MD  Insulin  Pen Needle (NOVOFINE) 30G X 8 MM MISC Inject 10 each into the skin as needed. Patient taking differently: Inject 1 packet into the skin as needed (insulin ). 10/20/19   Krishnan, Gokul, MD  isosorbide  mononitrate (IMDUR ) 120 MG 24 hr tablet Take 120 mg by mouth daily.    [provider]  lidocaine  (LIDODERM ) 5 % Place 1 patch onto the skin daily. Remove & Discard patch within 12 hours or as directed by MD 09/20/24   Dasie Faden, MD  metaxalone  (SKELAXIN ) 800 MG tablet Take 1 tablet (800 mg total) by mouth 3 (three) times daily. 09/20/24   Dasie Faden, MD  nitroGLYCERIN  (NITROSTAT ) 0.4 MG SL tablet Place 0.4 mg under the tongue every 5 (five) minutes as needed for chest pain. 08/10/21   [provider]  VENTOLIN  HFA 108 (90 Base) MCG/ACT inhaler Inhale 1-2 puffs into the lungs daily as needed for wheezing or shortness of breath. 03/02/21   [provider]    Physical Exam: Vitals:   10/04/24 1558 10/04/24 1600 10/04/24 1830 10/04/24 1900  BP: (!)  200/105  (!) 165/93 (!) 154/95  Pulse:  (!) 118 89 97  Resp:  16 11 19   Temp:      TempSrc:      SpO2:  100% 92% 97%  Weight:      Height:        Constitutional: NAD, calm, comfortable Vitals:   10/04/24 1558 10/04/24 1600 10/04/24 1830 10/04/24 1900  BP: (!) 200/105  (!) 165/93 (!) 154/95  Pulse:  (!) 118 89 97  Resp:  16 11 19   Temp:      TempSrc:      SpO2:  100% 92% 97%  Weight:      Height:       Eyes: PERRL, lids and conjunctivae normal ENMT: Mucous membranes are moist. Posterior pharynx clear of any exudate  or lesions.Normal dentition.  Neck: normal, supple, no masses, no thyromegaly Respiratory: Crackles at the bases bilaterally, no wheezes. Cardiovascular: Regular rate and rhythm, no murmurs / rubs / gallops.  +3 pitting edema right lower extremity 2+ pedal pulses. No carotid bruits.  Abdomen: no tenderness, no masses palpated. No hepatosplenomegaly. Bowel sounds positive.  Musculoskeletal: no clubbing / cyanosis.  Left BKA Skin: no rashes, lesions, ulcers. No induration Neurologic: CN 2-12 grossly intact. Sensation intact, DTR normal. Strength 5/5 in all 4.  Psychiatric: Normal judgment and insight. Alert and oriented x 3. Normal mood.    Labs on Admission: I have personally reviewed following labs and imaging studies  CBC: Recent Labs  Lab 10/04/24 1530  WBC 11.4*  NEUTROABS 8.0*  HGB 11.9*  HCT 37.5*  MCV 106.5*  PLT 209   Basic Metabolic Panel: Recent Labs  Lab 10/04/24 1530  NA 144  K 4.1  CL 108  CO2 23  GLUCOSE 128*  BUN 39*  CREATININE 6.62*  CALCIUM  8.6*   GFR: Estimated Creatinine Clearance: 17.3 mL/min (A) (by C-G formula based on SCr of 6.62 mg/dL (H)). Liver Function Tests: Recent Labs  Lab 10/04/24 1530  AST 23  ALT 10  ALKPHOS 67  BILITOT 0.5  PROT 7.3  ALBUMIN  3.5   No results for input(s): LIPASE, AMYLASE in the last 168 hours. No results for input(s): AMMONIA in the last 168 hours. Coagulation Profile: No  results for input(s): INR, PROTIME in the last 168 hours. Cardiac Enzymes: No results for input(s): CKTOTAL, CKMB, CKMBINDEX, TROPONINI in the last 168 hours. BNP (last 3 results) No results for input(s): PROBNP in the last 8760 hours. HbA1C: No results for input(s): HGBA1C in the last 72 hours. CBG: No results for input(s): GLUCAP in the last 168 hours. Lipid Profile: No results for input(s): CHOL, HDL, LDLCALC, TRIG, CHOLHDL, LDLDIRECT in the last 72 hours. Thyroid Function Tests: No results for input(s): TSH, T4TOTAL, FREET4, T3FREE, THYROIDAB in the last 72 hours. Anemia Panel: No results for input(s): VITAMINB12, FOLATE, FERRITIN, TIBC, IRON, RETICCTPCT in the last 72 hours. Urine analysis:    Component Value Date/Time   COLORURINE STRAW (A) 07/02/2022 2035   APPEARANCEUR CLEAR 07/02/2022 2035   LABSPEC 1.009 07/02/2022 2035   PHURINE 5.0 07/02/2022 2035   GLUCOSEU 50 (A) 07/02/2022 2035   HGBUR SMALL (A) 07/02/2022 2035   BILIRUBINUR NEGATIVE 07/02/2022 2035   BILIRUBINUR negative 01/02/2019 1136   KETONESUR NEGATIVE 07/02/2022 2035   PROTEINUR 100 (A) 07/02/2022 2035   UROBILINOGEN 1.0 01/02/2019 1136   UROBILINOGEN 1.0 08/26/2014 0455   NITRITE NEGATIVE 07/02/2022 2035   LEUKOCYTESUR TRACE (A) 07/02/2022 2035    Radiological Exams on Admission: DG Chest Port 1 View Result Date: 10/04/2024 EXAM: 1 VIEW(S) XRAY OF THE CHEST 10/04/2024 03:40:52 PM COMPARISON: Comparison with prior study dated 08/28/2023. CLINICAL HISTORY: Cough and shortness of breath. FINDINGS: LUNGS AND PLEURA: Mild diffuse interstitial pattern, possibly early edema. No focal consolidation. No pleural effusion. No pneumothorax. HEART AND MEDIASTINUM: Postoperative changes in the mediastinum. Mild cardiac enlargement. Mediastinal contours appear intact. BONES AND SOFT TISSUES: No acute osseous abnormality. IMPRESSION: 1. Mild diffuse interstitial pattern,  possibly early edema. No focal consolidation. 2. Mild cardiac enlargement. Electronically signed by: Elsie Gravely MD 10/04/2024 03:45 PM EST RP Workstation: [id number]    EKG: Independently reviewed.  Sinus tachycardia, no ST-T wave changes  Assessment/Plan Principal Problem:   Acute respiratory failure with hypoxia (HCC)   Acute hypoxic respiratory failure secondary to  acute pulmonary edema/acute on chronic combined systolic and diastolic congestive heart failure, POA: Prior echo done in May 2023 shows EF of 40 to 45% with left ventricular global hypokinesis and grade 1 diastolic dysfunction.  Chest x-ray shows possible early interstitial edema.  I will check BNP.  Patient still makes urine.  He is currently on 2 to 3 L of oxygen.  I will give him a dose of IV Lasix  60 mg.  Does not appear to be needing urgent dialysis however will definitely need and benefit from dialysis in the morning.  I have consulted nephrology/Dr. Geralynn.  Monitor strict I's and O's, daily weight, low-sodium diet.  Acute COPD exacerbation: Reportedly he was wheezy when EMS arrived, he received Solu-Medrol  and DuoNeb and route.  He has not received any treatment for this in the ED.  On my examination, he does not have any wheezes.  I am not sure if he really has COPD exacerbation.  However we will complete 5-day course with prednisone  and doxycycline .  I will check respiratory viral panel.  Wean oxygen as able to.  Elevated troponin: X 2 checked, both at 56.  Likely demand ischemia.  Last echo almost 2 years ago, update echo.  Hypertensive urgency: Blood pressure peaked at 200/105.  Currently 154/95.  Received all his home medications in the ED.  Will start him on as needed IV hydralazine .  ESRD on HD: MWF schedule, compliant with dialysis.  Consulted nephrology.  Hyperlipidemia: Resume statin.  Type 2 diabetes mellitus: Takes Humulin 70/30 mix, 18 units twice daily which I am going to resume and starting on  SSI.  DVT prophylaxis: heparin  injection 5,000 Units Start: 10/04/24 2200 Code Status: Full code Family Communication: None present at bedside.  Plan of care discussed with patient in length and he verbalized understanding and agreed with it. Disposition Plan: Potential discharge tomorrow after dialysis and echo completed Consults called: Nephrology/ Dr. Geralynn.  Fredia Skeeter MD [facility]  *Please note that this is a verbal dictation therefore any spelling or grammatical errors are due to the Dragon Medical One system interpretation.  Please page via Amion and do not message via secure chat for urgent patient care matters. Secure chat can be used for non urgent patient care matters. 10/04/2024, 7:35 PM  To contact the attending provider between 7A-7P or the covering provider during after hours 7P-7A, please log into the web site [email or web address]     [1]  Allergies Allergen Reactions   Lisinopril  Swelling   Lactose Intolerance (Gi)     Upset stomach    Other Diarrhea    Lactulose intolerant Lactulose intolerant   "

## 2024-10-04 NOTE — ED Provider Notes (Signed)
 " [place] [facility] AT [facility] Provider Note   CSN: [id number] Arrival date & time: 10/04/24  1510     Patient presents with: Shortness of Breath   Howard Mcmahon is a 54 y.o. male.   Pt is a 53y/o male with hx of ESRD on dialysis MWF, type 2 diabetes, hypertension, CAD, limb ischemia s/p right SFA to PT bypass with GSV on 05/20/2024, left BKA and self reported COPD who does not have inhalers at home, presenting today with Mast for complaints of shortness of breath.  Patient reports his symptoms really started this morning with a dry cough and shortness of breath.  By this afternoon it got worse when he would try to lay down and he just could not catch his breath.  When EMS arrived they reported that patient was satting 80% on room air with respiratory distress.  He was given Solu-Medrol  and 2 DuoNebs which patient now reports he feels much better but is still mildly short of breath.  His last dialysis was on Friday and he has not missed any doses, he reports he has not had any chest pain.  He has not had fever or productive cough but does report someone he lives with has had cold-like symptoms.  He does have swelling in his right lower extremity but reports that similar to his chronic swelling and based on seeing dialysis his weights have been stable.  He does make urine and denies any dysuria frequency or urgency.  He has had no nausea or vomiting.  No recent medication changes.  The history is provided by the patient and the EMS personnel.  Shortness of Breath      Prior to Admission medications  Medication Sig Start Date End Date Taking? Authorizing Provider  acetaminophen  (TYLENOL ) 325 MG tablet Take 2 tablets (650 mg total) by mouth every 6 (six) hours as needed. Patient taking differently: Take 650 mg by mouth every 6 (six) hours as needed for mild pain. 08/14/21   Theotis Peers M, PA-C  aspirin  81 MG EC tablet Take 1 tablet (81 mg total) by mouth at bedtime.  Swallow whole. Patient taking differently: Take 162 mg by mouth at bedtime. 01/20/22   Jerri Keys, MD  atorvastatin  (LIPITOR) 80 MG tablet Take 80 mg by mouth daily. 10/03/21   [provider]  Blood Glucose Monitoring Suppl (TRUE METRIX METER) w/Device KIT Use as directed Patient taking differently: 1 each by Other route as directed. 01/02/19   Celestia Rosaline SQUIBB, NP  carvedilol  (COREG ) 25 MG tablet Take 25 mg by mouth 2 (two) times daily. 10/03/21   [provider]  clindamycin  (CLEOCIN ) 300 MG capsule Take 1 capsule (300 mg total) by mouth 3 (three) times daily. 03/14/23   Doretha Folks, MD  doxycycline  (VIBRAMYCIN ) 100 MG capsule Take 1 capsule (100 mg total) by mouth 2 (two) times daily. 08/02/24   Dufour, Marry RAMAN, PA-C  glucose blood (TRUE METRIX BLOOD GLUCOSE TEST) test strip Check blood sugar fasting and before meals and again if pt feels bad (symptoms of hypo). Check sugar three times a day Patient taking differently: 1 each by Other route See admin instructions. Check blood sugar fasting and before meals and again if pt feels bad (symptoms of hypo). Check sugar three times a day 10/10/19   Odell Balls, PA-C  hydrALAZINE  (APRESOLINE ) 50 MG tablet Take 50 mg by mouth 3 (three) times daily.    [provider]  Insulin  Isophane & Regular Human (  NOVOLIN  70/30 FLEXPEN RELION) (70-30) 100 UNIT/ML PEN Inject 18 Units into the skin 2 (two) times daily. Patient taking differently: Inject 36 Units into the skin 2 (two) times daily. 10/20/19   Krishnan, Gokul, MD  Insulin  Pen Needle (NOVOFINE) 30G X 8 MM MISC Inject 10 each into the skin as needed. Patient taking differently: Inject 1 packet into the skin as needed (insulin ). 10/20/19   Krishnan, Gokul, MD  isosorbide  mononitrate (IMDUR ) 120 MG 24 hr tablet Take 120 mg by mouth daily.    [provider]  lidocaine  (LIDODERM ) 5 % Place 1 patch onto the skin daily. Remove & Discard patch within 12 hours or as directed  by MD 09/20/24   Dasie Faden, MD  metaxalone  (SKELAXIN ) 800 MG tablet Take 1 tablet (800 mg total) by mouth 3 (three) times daily. 09/20/24   Dasie Faden, MD  nitroGLYCERIN  (NITROSTAT ) 0.4 MG SL tablet Place 0.4 mg under the tongue every 5 (five) minutes as needed for chest pain. 08/10/21   [provider]  VENTOLIN  HFA 108 (90 Base) MCG/ACT inhaler Inhale 1-2 puffs into the lungs daily as needed for wheezing or shortness of breath. 03/02/21   [provider]    Allergies: Lisinopril , Lactose intolerance (gi), and Other    Review of Systems  Respiratory:  Positive for shortness of breath.     Updated Vital Signs BP (!) 154/95   Pulse 97   Temp 98.1 F (36.7 C) (Oral)   Resp 19   Ht 6' 4 (1.93 m)   Wt 104.3 kg   SpO2 97%   BMI 28.00 kg/m   Physical Exam Vitals and nursing note reviewed.  Constitutional:      General: He is not in acute distress.    Appearance: He is well-developed.  HENT:     Head: Normocephalic and atraumatic.  Eyes:     Conjunctiva/sclera: Conjunctivae normal.     Pupils: Pupils are equal, round, and reactive to light.  Cardiovascular:     Rate and Rhythm: Regular rhythm. Tachycardia present.     Heart sounds: No murmur heard. Pulmonary:     Effort: Pulmonary effort is normal. Tachypnea present. No respiratory distress or retractions.     Breath sounds: Decreased breath sounds present. No wheezing or rales.  Abdominal:     General: There is no distension.     Palpations: Abdomen is soft.     Tenderness: There is no abdominal tenderness. There is no guarding or rebound.  Musculoskeletal:        General: No tenderness. Normal range of motion.     Cervical back: Normal range of motion and neck supple.     Comments: Left bka.  Right leg with 2+ pitting edema to the knee  Skin:    General: Skin is warm and dry.     Findings: No erythema or rash.  Neurological:     Mental Status: He is alert and oriented to person, place, and time.  Mental status is at baseline.  Psychiatric:        Mood and Affect: Mood normal.        Behavior: Behavior normal.     (all labs ordered are listed, but only abnormal results are displayed) Labs Reviewed  CBC WITH DIFFERENTIAL/PLATELET - Abnormal; Notable for the following components:      Result Value   WBC 11.4 (*)    RBC 3.52 (*)    Hemoglobin 11.9 (*)    HCT 37.5 (*)  MCV 106.5 (*)    Neutro Abs 8.0 (*)    All other components within normal limits  COMPREHENSIVE METABOLIC PANEL WITH GFR - Abnormal; Notable for the following components:   Glucose, Bld 128 (*)    BUN 39 (*)    Creatinine, Ser 6.62 (*)    Calcium  8.6 (*)    GFR, Estimated 9 (*)    All other components within normal limits  TROPONIN T, HIGH SENSITIVITY - Abnormal; Notable for the following components:   Troponin T High Sensitivity 56 (*)    All other components within normal limits  TROPONIN T, HIGH SENSITIVITY - Abnormal; Notable for the following components:   Troponin T High Sensitivity 56 (*)    All other components within normal limits  RESP PANEL BY RT-PCR (RSV, FLU A&B, COVID)  [id number]    EKG: EKG Interpretation Date/Time:  Sunday October 04 2024 15:20:00 EST Ventricular Rate:  116 PR Interval:  164 QRS Duration:  90 QT Interval:  337 QTC Calculation: 469 R Axis:   13  Text Interpretation: Sinus tachycardia Nonspecific T abnormalities, lateral leads No significant change since last tracing Confirmed by Doretha Folks ([id number]) on 10/04/2024 3:57:17 PM  Radiology: [facility] Chest Port 1 View Result Date: 10/04/2024 EXAM: 1 VIEW(S) XRAY OF THE CHEST 10/04/2024 03:40:52 PM COMPARISON: Comparison with prior study dated 08/28/2023. CLINICAL HISTORY: Cough and shortness of breath. FINDINGS: LUNGS AND PLEURA: Mild diffuse interstitial pattern, possibly early edema. No focal consolidation. No pleural effusion. No pneumothorax. HEART AND MEDIASTINUM: Postoperative changes in the mediastinum. Mild cardiac  enlargement. Mediastinal contours appear intact. BONES AND SOFT TISSUES: No acute osseous abnormality. IMPRESSION: 1. Mild diffuse interstitial pattern, possibly early edema. No focal consolidation. 2. Mild cardiac enlargement. Electronically signed by: Elsie Gravely MD 10/04/2024 03:45 PM EST RP Workstation: [id number]     Procedures   Medications Ordered in the ED  hydrALAZINE  (APRESOLINE ) tablet 75 mg (75 mg Oral Given 10/04/24 1558)  amLODipine  (NORVASC ) tablet 5 mg (5 mg Oral Given 10/04/24 1558)  carvedilol  (COREG ) tablet 25 mg (25 mg Oral Given 10/04/24 1558)                                    Medical Decision Making Amount and/or Complexity of Data Reviewed Independent Historian: EMS External Data Reviewed: notes. Labs: ordered. Decision-making details documented in ED Course. Radiology: ordered and independent interpretation performed. Decision-making details documented in ED Course. ECG/medicine tests: ordered and independent interpretation performed. Decision-making details documented in ED Course.  Risk Prescription drug management.   Pt with multiple medical problems and comorbidities and presenting today with a complaint that caries a high risk for morbidity and mortality.  Here today with shortness of breath and nonproductive cough.  Patient has a history of end-stage renal disease and is on dialysis but has been compliant with his dialysis.  He reports he has a history of COPD however this is not an prior notes from his doctor patient did improve with DuoNebs.  Sats are significantly improved from what he was at home.  He does not use oxygen regularly denies any tobacco.  No recent medication changes.  Concern for possible infectious etiology, COPD exacerbation, fluid overload.  Lower suspicion for ACS but could be atypical.  Lower suspicion for PE.  Upon arrival here patient reports he is feeling much better.  He does not have significant wheezing currently and breath  sounds are  heard in all lung fields.  Low suspicion for pneumothorax at this time.  Symptoms are not classic for dissection and lower suspicion for pericardial tamponade patient has already received Solu-Medrol .  Will transition to a nasal cannula.  Imaging and lab work are pending.  He was given a dose of home blood pressure medications. I independently interpreted patient's labs and EKG.  EKG shows sinus tachycardia but no other acute findings.  CBC with minimal leukocytosis of 11, stable hemoglobin of 11.9, normal platelet count, CMP consistent with end-stage renal disease but potassium is stable at 4.1, LFTs are normal and normal anion gap, troponin is elevated at 56 and will do a delta.    I have independently visualized and interpreted pt's images today.  CXR with some concern for fluid overload. He did report he has not had his blood pressure medication today because he was feeling too sick.  He did take all of his medications yesterday.  On reevaluation now patient reports feeling much better.  He is in no distress and breathing comfortably.  Heart rate has now improved to the 80s after getting his home medications.  Will repeat a troponin but patient is wanting to go home.  Feel this is reasonable if patient does not drop his sats without oxygen and is able to ambulate without getting significantly short of breath.  Would need to go home with an inhaler. Patient's delta troponin is flat at 56.  However when going back into the room patient sats were 88%.  With repositioning his sats did come up to the low 90s but he reported he was starting to feel short of breath again.  Feel that patient needs admission for dialysis and some of this is most likely fluid overload but can continue treatment for COPD as well.  Will admit to the hospitalist.  Discussed with the patient and he is comfortable with this plan.  He is currently on 2 L of oxygen.     Final diagnoses:  COPD exacerbation (HCC)  Other  hypervolemia    ED Discharge Orders     None          Doretha Folks, MD 10/04/24 1907  "

## 2024-10-04 NOTE — ED Triage Notes (Signed)
 Pt BIB [facility] from home for [facility] that began this AM. Pt has hx COPD, CHF, dialysis, did not miss any dialysis treatments. EMS noted expiratory wheezing, gave 125 solumedrol and 2 duonebs. EMS BP 190/110, pt ST in 110s, initial SpO2 80% on RA, 100% on breathing treatment.

## 2024-10-05 ENCOUNTER — Encounter (HOSPITAL_COMMUNITY): Payer: Self-pay | Admitting: Family Medicine

## 2024-10-05 ENCOUNTER — Observation Stay (HOSPITAL_COMMUNITY)

## 2024-10-05 DIAGNOSIS — I5021 Acute systolic (congestive) heart failure: Secondary | ICD-10-CM | POA: Diagnosis not present

## 2024-10-05 DIAGNOSIS — J9601 Acute respiratory failure with hypoxia: Secondary | ICD-10-CM | POA: Diagnosis not present

## 2024-10-05 LAB — CBC
HCT: 33.1 % — ABNORMAL LOW (ref 39.0–52.0)
Hemoglobin: 10.6 g/dL — ABNORMAL LOW (ref 13.0–17.0)
MCH: 34.1 pg — ABNORMAL HIGH (ref 26.0–34.0)
MCHC: 32 g/dL (ref 30.0–36.0)
MCV: 106.4 fL — ABNORMAL HIGH (ref 80.0–100.0)
Platelets: 190 10*3/uL (ref 150–400)
RBC: 3.11 MIL/uL — ABNORMAL LOW (ref 4.22–5.81)
RDW: 14.1 % (ref 11.5–15.5)
WBC: 10.9 10*3/uL — ABNORMAL HIGH (ref 4.0–10.5)
nRBC: 0 % (ref 0.0–0.2)

## 2024-10-05 LAB — RESPIRATORY PANEL BY PCR

## 2024-10-05 LAB — PHOSPHORUS: Phosphorus: 3.8 mg/dL (ref 2.5–4.6)

## 2024-10-05 LAB — HEMOGLOBIN A1C
Hgb A1c MFr Bld: 6.2 % — ABNORMAL HIGH (ref 4.8–5.6)
Mean Plasma Glucose: 131.24 mg/dL

## 2024-10-05 LAB — HIV ANTIBODY (ROUTINE TESTING W REFLEX): HIV Screen 4th Generation wRfx: NONREACTIVE

## 2024-10-05 LAB — ECHOCARDIOGRAM COMPLETE
Area-P 1/2: 4.17 cm2
Calc EF: 50.7 %
Height: 76 in
S' Lateral: 4.1 cm
Single Plane A2C EF: 48 %
Single Plane A4C EF: 48.6 %
Weight: 3680 [oz_av]

## 2024-10-05 LAB — BASIC METABOLIC PANEL WITH GFR
Anion gap: 15 (ref 5–15)
BUN: 50 mg/dL — ABNORMAL HIGH (ref 6–20)
CO2: 20 mmol/L — ABNORMAL LOW (ref 22–32)
Calcium: 8.5 mg/dL — ABNORMAL LOW (ref 8.9–10.3)
Chloride: 105 mmol/L (ref 98–111)
Creatinine, Ser: 7.3 mg/dL — ABNORMAL HIGH (ref 0.61–1.24)
GFR, Estimated: 8 mL/min — ABNORMAL LOW
Glucose, Bld: 209 mg/dL — ABNORMAL HIGH (ref 70–99)
Potassium: 4.6 mmol/L (ref 3.5–5.1)
Sodium: 140 mmol/L (ref 135–145)

## 2024-10-05 LAB — MAGNESIUM: Magnesium: 2 mg/dL (ref 1.7–2.4)

## 2024-10-05 LAB — PRO BRAIN NATRIURETIC PEPTIDE: Pro Brain Natriuretic Peptide: 5708 pg/mL — ABNORMAL HIGH

## 2024-10-05 LAB — HEPATITIS B SURFACE ANTIGEN: Hepatitis B Surface Ag: REACTIVE — AB

## 2024-10-05 LAB — GLUCOSE, CAPILLARY: Glucose-Capillary: 258 mg/dL — ABNORMAL HIGH (ref 70–99)

## 2024-10-05 LAB — CBG MONITORING, ED: Glucose-Capillary: 143 mg/dL — ABNORMAL HIGH (ref 70–99)

## 2024-10-05 MED ORDER — IPRATROPIUM-ALBUTEROL 0.5-2.5 (3) MG/3ML IN SOLN: 3.0000 mL | Freq: Three times a day (TID) | RESPIRATORY_TRACT | Status: DC

## 2024-10-05 MED ORDER — HEPARIN SODIUM (PORCINE) 1000 UNIT/ML DIALYSIS: 1500.0000 [IU] | INTRAMUSCULAR | Status: DC | PRN

## 2024-10-05 MED ORDER — LIDOCAINE-PRILOCAINE 2.5-2.5 % EX CREA: 1.0000 | TOPICAL_CREAM | CUTANEOUS | Status: DC | PRN

## 2024-10-05 MED ORDER — HEPARIN SODIUM (PORCINE) 1000 UNIT/ML DIALYSIS: 1000.0000 [IU] | INTRAMUSCULAR | Status: DC | PRN

## 2024-10-05 MED ORDER — ALTEPLASE 2 MG IJ SOLR: 2.0000 mg | Freq: Once | INTRAMUSCULAR | Status: DC | PRN

## 2024-10-05 MED ORDER — CHLORHEXIDINE GLUCONATE CLOTH 2 % EX PADS: 6.0000 | MEDICATED_PAD | Freq: Every day | CUTANEOUS | Status: DC

## 2024-10-05 MED ORDER — PENTAFLUOROPROP-TETRAFLUOROETH EX AERO: 1.0000 | INHALATION_SPRAY | CUTANEOUS | Status: DC | PRN

## 2024-10-05 MED ORDER — CARVEDILOL 25 MG PO TABS
25.0000 mg | ORAL_TABLET | Freq: Two times a day (BID) | ORAL | Status: DC
  Administered 2024-10-05 – 2024-10-06 (×2): 25 mg via ORAL
  Filled 2024-10-05 (×2): qty 1

## 2024-10-05 MED ORDER — NEPRO/CARBSTEADY PO LIQD
237.0000 mL | ORAL | Status: DC | PRN
  Filled 2024-10-05: qty 237

## 2024-10-05 MED ORDER — HEPARIN SODIUM (PORCINE) 1000 UNIT/ML DIALYSIS
3000.0000 [IU] | Freq: Once | INTRAMUSCULAR | Status: AC
  Administered 2024-10-05: 3000 [IU] via INTRAVENOUS_CENTRAL

## 2024-10-05 MED ORDER — PERFLUTREN LIPID MICROSPHERE
1.0000 mL | INTRAVENOUS | Status: AC | PRN
  Administered 2024-10-05: 4 mL via INTRAVENOUS

## 2024-10-05 MED ORDER — ANTICOAGULANT SODIUM CITRATE 4% (200MG/5ML) IV SOLN
5.0000 mL | Status: DC | PRN
  Filled 2024-10-05: qty 5

## 2024-10-05 MED ORDER — LIDOCAINE HCL (PF) 1 % IJ SOLN: 5.0000 mL | INTRAMUSCULAR | Status: DC | PRN

## 2024-10-05 NOTE — ED Notes (Signed)
 Patient resting in position of comfort, easily aroused alert and oriented without complaint.

## 2024-10-05 NOTE — Consult Note (Signed)
 Renal Service Consult Note [facility] Lamar JONETTA Fret, MD  Patient: Howard Mcmahon Date: 10/05/2024 Requesting Physician: Dr. Dino  Reason for Consult: ESRD pt w/ SOB HPI: The patient is a 54 y.o. year-old w/ PMH significant for ESRD on HD, DM, HTN, chronic hep B infection, CAD, PAD L BKA who presented to ED c/o DOE, going on for months. Was wheezing and was given IV solumedrol and 2 duonebs. In ED BP 200/ 105, HR 118, RR 16-25. Was 3L [place] O2 w/ 99% sats. K+ 4.1, bun 39, creat 6.6, alb 3.5. Trop 56, 56.  wBC 11K, Hb 11.9. CXR showed diffuse bilat IS pulm edema. Pt rec'd norvasc , asa, coreg , , hydralazine , IV lasix  60mg  x1, imdur , insulin . Pt was admitted. We are asked to see for dialysis.    Pt seen in room. He says he has chronic hep B infection and gets dialysis in [facility] on [address], in a special area, with a door.  On HD for about 6 months. Has been having sig DOE w/ walking 10 - 20 yards. Also states they are not pulling much, because I'm coming in near my dry wt. + RLE edema below the knee. Denies orthopnea, severe SOB at rest. No f/c/s, no n/v/d.    ROS - denies CP, no joint pain, no HA, no blurry vision, no rash, no diarrhea, no nausea/ vomiting   Past Medical History  Past Medical History:  Diagnosis Date   Chronic hepatitis B virus infection (HCC)    Diabetes mellitus without complication (HCC)    ESRD on hemodialysis (HCC)    MWF [facility] [place] on [address]   Hypertension    Macular infarction    2012   MI (myocardial infarction) (HCC)    Peripheral vascular disease    Neuropathy   Pneumonia    Past Surgical History  Past Surgical History:  Procedure Laterality Date   CARDIAC SURGERY     CORONARY ANGIOPLASTY WITH STENT PLACEMENT     CORONARY ARTERY BYPASS GRAFT     HIP ARTHROPLASTY Right    Family History  Family History  Problem Relation Age of Onset   Stroke Mother    Social History  reports that he has quit smoking. His  smoking use included cigarettes. He has a 60 pack-year smoking history. He uses smokeless tobacco. He reports that he does not drink alcohol and does not use drugs. Allergies Allergies[1] Home medications Prior to Admission medications  Medication Sig Start Date End Date Taking? Authorizing Provider  acetaminophen  (TYLENOL ) 325 MG tablet Take 2 tablets (650 mg total) by mouth every 6 (six) hours as needed. Patient taking differently: Take 650 mg by mouth every 6 (six) hours as needed for mild pain. 08/14/21   Theotis Cameron HERO, PA-C  aspirin  81 MG EC tablet Take 1 tablet (81 mg total) by mouth at bedtime. Swallow whole. Patient taking differently: Take 162 mg by mouth at bedtime. 01/20/22   Jerri Keys, MD  atorvastatin  (LIPITOR) 80 MG tablet Take 80 mg by mouth daily. 10/03/21   [provider]  Blood Glucose Monitoring Suppl (TRUE METRIX METER) w/Device KIT Use as directed Patient taking differently: 1 each by Other route as directed. 01/02/19   Celestia Rosaline SQUIBB, NP  carvedilol  (COREG ) 25 MG tablet Take 25 mg by mouth 2 (two) times daily. 10/03/21   [provider]  clindamycin  (CLEOCIN ) 300 MG capsule Take 1 capsule (300 mg total) by mouth 3 (three) times daily. 03/14/23  Doretha Folks, MD  doxycycline  (VIBRAMYCIN ) 100 MG capsule Take 1 capsule (100 mg total) by mouth 2 (two) times daily. 08/02/24   Dufour, Marry RAMAN, PA-C  glucose blood (TRUE METRIX BLOOD GLUCOSE TEST) test strip Check blood sugar fasting and before meals and again if pt feels bad (symptoms of hypo). Check sugar three times a day Patient taking differently: 1 each by Other route See admin instructions. Check blood sugar fasting and before meals and again if pt feels bad (symptoms of hypo). Check sugar three times a day 10/10/19   Odell Balls, PA-C  hydrALAZINE  (APRESOLINE ) 50 MG tablet Take 50 mg by mouth 3 (three) times daily.    [provider]  Insulin  Isophane & Regular Human (NOVOLIN  70/30  FLEXPEN RELION) (70-30) 100 UNIT/ML PEN Inject 18 Units into the skin 2 (two) times daily. Patient taking differently: Inject 36 Units into the skin 2 (two) times daily. 10/20/19   Krishnan, Gokul, MD  Insulin  Pen Needle (NOVOFINE) 30G X 8 MM MISC Inject 10 each into the skin as needed. Patient taking differently: Inject 1 packet into the skin as needed (insulin ). 10/20/19   Krishnan, Gokul, MD  isosorbide  mononitrate (IMDUR ) 120 MG 24 hr tablet Take 120 mg by mouth daily.    [provider]  lidocaine  (LIDODERM ) 5 % Place 1 patch onto the skin daily. Remove & Discard patch within 12 hours or as directed by MD 09/20/24   Dasie Faden, MD  metaxalone  (SKELAXIN ) 800 MG tablet Take 1 tablet (800 mg total) by mouth 3 (three) times daily. 09/20/24   Dasie Faden, MD  nitroGLYCERIN  (NITROSTAT ) 0.4 MG SL tablet Place 0.4 mg under the tongue every 5 (five) minutes as needed for chest pain. 08/10/21   [provider]  VENTOLIN  HFA 108 (90 Base) MCG/ACT inhaler Inhale 1-2 puffs into the lungs daily as needed for wheezing or shortness of breath. 03/02/21   [provider]     Vitals:   10/05/24 0600 10/05/24 0814 10/05/24 0815 10/05/24 0815  BP: 139/77  130/83   Pulse: 82  83   Resp: 15  18   Temp:    97.8 F (36.6 C)  TempSrc:    Oral  SpO2: 98% 100% 98%   Weight:      Height:       Exam Gen alert, no distress, on room air Sclera anicteric, throat clear  No jvd or bruits Chest crackles R base, L clear RRR no MRG Abd soft ntnd no mass or ascites +bs Ext 2+ pitting RLE pretib edema, L BKA no edema, no hip edema Neuro is alert, Ox 3 , nf    LUE AVF+bruit   Home bp meds: Coreg  Hydralazine     OP HD: [facility] MWF [facility] [address] From sept 2025 --> 4h  B425   122.5kg  AVF  Heparin  3000 bolus + 500u/hr Chronic hepatitis B infection   Assessment/ Plan:  # ESRD - on HD MWF in [place] - chronic hep B infection - plan HD later today   # HTN - bp is good  140/80 - hold coreg  until after HD   # Volume - volume overload, pulm edema on exam/ CXR; not in distress, mostly DOE - per pt they haven't been pulling much because he comes in close to dry wt - likely losing body wt and will need lowering of dry wt - max UF w/ HD today 3-3.5 L goal  # SOB/ DOE - possibly combination  of COPD and pulm edema/ vol overload - was rx'd w/ nebs per EMS yesterday  # Anemia of esrd - Hb 10-12 here, follow  # Chronic hepatitis B infection     Howard Fret  MD [facility] 10/05/2024, 9:27 AM  Recent Labs  Lab 10/04/24 1530 10/05/24 0418  HGB 11.9* 10.6*  ALBUMIN  3.5  --   CALCIUM  8.6* 8.5*  PHOS  --  3.8  CREATININE 6.62* 7.30*  K 4.1 4.6   Inpatient medications:  aspirin  EC  81 mg Oral QHS   atorvastatin   80 mg Oral Daily   carvedilol   25 mg Oral BID   Chlorhexidine  Gluconate Cloth  6 each Topical Q0600   doxycycline   100 mg Oral Q12H   heparin   5,000 Units Subcutaneous Q8H   insulin  aspart  0-15 Units Subcutaneous TID WC   insulin  aspart  0-5 Units Subcutaneous QHS   insulin  aspart protamine- aspart  18 Units Subcutaneous BID WC   ipratropium-albuterol   3 mL Nebulization Q6H   isosorbide  mononitrate  120 mg Oral Daily   metaxalone   800 mg Oral TID   predniSONE   40 mg Oral Q breakfast   sodium chloride  flush  3 mL Intravenous Q12H    acetaminophen  **OR** acetaminophen , nitroGLYCERIN , ondansetron  **OR** ondansetron  (ZOFRAN ) IV      [1]  Allergies Allergen Reactions   Lisinopril  Swelling   Lactose Intolerance (Gi)     Upset stomach    Other Diarrhea    Lactulose intolerant Lactulose intolerant

## 2024-10-05 NOTE — Progress Notes (Signed)
 Heart Failure Navigator Progress Note  Assessed for Heart & Vascular TOC clinic readiness.  Patient does not meet criteria due to ESRD on hemodialysis. No HF TOC.   Navigator will sign off at this time.   Randie Bustle, BSN, Scientist, clinical (histocompatibility and immunogenetics) Only

## 2024-10-05 NOTE — Progress Notes (Signed)
 Pt receives out-pt HD at [facility], MWF, 7am. Pt noted to be hep b precautions at clinic. Notified clinic of arrival, will continue to assist as needed.   Howard Mcmahon Dialysis Nav [phone]

## 2024-10-05 NOTE — Discharge Planning (Signed)
 Transition of Care [facility]) - Inpatient Brief Assessment   Patient Details  Name: Howard Mcmahon MRN: [id number] Date of Birth: 03/29/1971  Transition of Care [facility]) CM/SW Contact:    Debarah Saunas, RN Phone Number: 10/05/2024, 11:00 AM   Clinical Narrative: Inpatient Care Management (ICM) has reviewed patient at bedside and no ICM needs have been identified at this time. We will continue to monitor patient advancement through interdisciplinary progression rounds. If new patient transition needs arise, please place a ICM consult.    Transition of Care Asessment: Insurance and Status: Insurance coverage has been reviewed Patient has primary care physician: Yes Home environment has been reviewed: From home with family Prior level of function:: moderate assistance, home DME Cane-single point, Crutches, Walker-rolling, [employer] Prior/Current Home Services: No current home services Social Drivers of Health Review: SDOH reviewed interventions complete Readmission risk has been reviewed: No Transition of care needs: no transition of care needs at this time

## 2024-10-05 NOTE — Inpatient Diabetes Management (Signed)
 Inpatient Diabetes Program Recommendations  AACE/ADA: New Consensus Statement on Inpatient Glycemic Control (2015)  Target Ranges:  Prepandial:   less than 140 mg/dL      Peak postprandial:   less than 180 mg/dL (1-2 hours)      Critically ill patients:  140 - 180 mg/dL   Lab Results  Component Value Date   GLUCAP 143 (H) 10/05/2024   HGBA1C 6.2 (H) 10/05/2024    Review of Glycemic Control  Latest Reference Range & Units 10/04/24 23:36 10/05/24 08:42  Glucose-Capillary 70 - 99 mg/dL 746 (H) 856 (H)   Diabetes history: DM 2 Outpatient Diabetes medications:  70/30 36 units bid  Current orders for Inpatient glycemic control:  70/30 18 units bid Novolog  0-15 units tid with meals and HS Prednisone  40 mg daily Inpatient Diabetes Program Recommendations:    Agree with current orders.  Will follow.   Thanks,  Randall Bullocks, RN, BC-ADM Inpatient Diabetes Coordinator Pager [phone]  (8a-5p)

## 2024-10-05 NOTE — Progress Notes (Signed)
 Echocardiogram 2D Echocardiogram has been performed.  Howard Mcmahon 10/05/2024, 10:35 AM

## 2024-10-05 NOTE — Progress Notes (Addendum)
 " Progress Note   Patient: Howard Mcmahon FMW:[id number] DOB: 1970/10/19 DOA: 10/04/2024     0 DOS: the patient was seen and examined on 10/05/2024   Brief hospital course:  54 y.o. male with medical history significant of ESRD on dialysis MWF, type 2 diabetes, hypertension, CAD, limb ischemia s/p right SFA to PT bypass with GSV on 05/20/2024, left BKA and self reported COPD who was brought into the ED by EMS due to shortness of breath. Upon arrival to ED, his blood pressure was significantly elevated, peaked at 200/105. Patient received all his home antihypertensive medications including amlodipine , Coreg  and hydralazine  in the ED, blood pressure improved.  Admitted for management of hypertensive urgency, acute hypoxic aspiratory failure, acute on chronic combined systolic diastolic CHF as well as possible COPD exacerbation  Assessment and Plan:  Acute hypoxic respiratory failure secondary to acute pulmonary edema/acute on chronic combined systolic and diastolic congestive heart failure, POA:  Prior echo done in May 2023 shows EF of 40 to 45% with left ventricular global hypokinesis and grade 1 diastolic dysfunction.   Chest x-ray shows possible early interstitial edema.  proBNP is elevated at 5708 patient still makes urine.  He was initially placed on 2 L nasal oxygen cannula, not requiring any supplemental oxygen now. He did receive a dose of IV Lasix  60 mg in ED. -Follow-up repeat echocardiogram - Nephrology consulted for hemodialysis management -Patient will need home oxygen evaluation before discharge   Acute COPD exacerbation: Reportedly he was wheezy when EMS arrived - Tested negative for COVID-19, RSV and influenza panel -Continue with initial bronchodilator therapy, doxycycline  as well as steroids (prednisone  40 mg daily).  Patient will need outpatient follow-up with pulmonology and PFTs.  He does have a 20-pack-year smoking history.   Elevated troponin/troponinemia: X 2 checked, both at  56.  Likely demand ischemia.  Last echo almost 2 years ago, follow-up repeat echocardiogram   Hypertensive urgency: Blood pressure peaked at 200/105.  Blood pressure is improved now.  Received all his home medications in the ED. continue with intravenous hydralazine  10 mg as needed for systolic blood pressure greater than 160   ESRD on HD: MWF schedule, compliant with dialysis.  Consulted nephrology for hemodialysis management. He is on renal transplant list   Hyperlipidemia: Resumed statin.   Insulin  dependent type 2 diabetes mellitus: Takes Humulin 70/30 mix, 18 units twice daily which I am going to resume and starting on SSI.  Disposition: Patient lives at home with his mother.     Subjective: He states that his shortness of breath is better but still not at baseline.  He is in end-stage renal dialysis patient and gets dialysis on Monday, Wednesday and Fridays.  Last hemodialysis was on Friday.  I spoke to him about getting a transthoracic echocardiogram.  He does have a 20-pack-year smoking history but has not been officially diagnosed with COPD/emphysema.  He says that he does take Lasix  at home.  Lives with his mother.  He has a left arm graft for hemodialysis.  Physical Exam: Vitals:   10/05/24 0600 10/05/24 0814 10/05/24 0815 10/05/24 0815  BP: 139/77  130/83   Pulse: 82  83   Resp: 15  18   Temp:    97.8 F (36.6 C)  TempSrc:    Oral  SpO2: 98% 100% 98%   Weight:      Height:       Constitutional: NAD, calm, comfortable Eyes: PERRL, lids and conjunctivae normal ENMT: Mucous membranes are moist.  Posterior pharynx clear of any exudate or lesions.Normal dentition.  Neck: normal, supple, no masses, no thyromegaly Respiratory: Slightly coarse breath sounds bilaterally Cardiovascular: Regular rate and rhythm, no murmurs / rubs / gallops. No extremity edema. 2+ pedal pulses. No carotid bruits.  Abdomen: no tenderness, no masses palpated. No hepatosplenomegaly. Bowel sounds  positive.  Musculoskeletal: no clubbing / cyanosis. No joint deformity upper and lower extremities. Good ROM, no contractures. Normal muscle tone.  Skin: no rashes, lesions, ulcers. No induration.  Left arm graft in place for hemodialysis Neurologic: CN 2-12 grossly intact. Sensation intact, DTR normal. Strength 5/5 x all 4 extremities.  Psychiatric: Normal judgment and insight. Alert and oriented x 3. Normal mood.   Data Reviewed:  There are no new results to review at this time.  Family Communication: None at the bedside  Disposition: Status is: Observation The patient remains OBS appropriate and will d/c before 2 midnights.  Planned Discharge Destination: Home    Time spent: 40 minutes  Author: Deliliah Room, MD 10/05/2024 9:25 AM  For on call review [email or web address].  "

## 2024-10-05 NOTE — ED Notes (Signed)
 Patient alert and oriented resting in bed in a position comfort. Complains of lower back pain ongoing for several days. Requested something for discomfort and accepted tylenol . Rates discomfort 6/10. Denied traumatic event to cause pain.

## 2024-10-06 ENCOUNTER — Encounter (HOSPITAL_COMMUNITY): Payer: Self-pay | Admitting: Family Medicine

## 2024-10-06 DIAGNOSIS — J9601 Acute respiratory failure with hypoxia: Secondary | ICD-10-CM | POA: Diagnosis not present

## 2024-10-06 LAB — GLUCOSE, CAPILLARY
Glucose-Capillary: 120 mg/dL — ABNORMAL HIGH (ref 70–99)
Glucose-Capillary: 115 mg/dL — ABNORMAL HIGH (ref 70–99)
Glucose-Capillary: 68 mg/dL — ABNORMAL LOW (ref 70–99)

## 2024-10-06 MED ORDER — ALBUTEROL SULFATE HFA 108 (90 BASE) MCG/ACT IN AERS: 2.0000 | INHALATION_SPRAY | Freq: Four times a day (QID) | RESPIRATORY_TRACT | 0 refills | Status: AC | PRN

## 2024-10-06 MED ORDER — ISOSORBIDE MONONITRATE ER 120 MG PO TB24: 120.0000 mg | ORAL_TABLET | Freq: Every day | ORAL | Status: AC

## 2024-10-06 MED ORDER — DOXYCYCLINE HYCLATE 100 MG PO TABS: 100.0000 mg | ORAL_TABLET | Freq: Two times a day (BID) | ORAL | 0 refills | Status: AC

## 2024-10-06 MED ORDER — PREDNISONE 20 MG PO TABS: 40.0000 mg | ORAL_TABLET | Freq: Every day | ORAL | 0 refills | Status: AC

## 2024-10-06 MED ORDER — FLUTICASONE FUROATE-VILANTEROL 100-25 MCG/ACT IN AEPB: 1.0000 | INHALATION_SPRAY | Freq: Every day | RESPIRATORY_TRACT | 0 refills | Status: AC

## 2024-10-06 MED ORDER — NOVOLIN 70/30 FLEXPEN RELION (70-30) 100 UNIT/ML ~~LOC~~ SUPN: 36.0000 [IU] | PEN_INJECTOR | Freq: Two times a day (BID) | SUBCUTANEOUS | Status: AC

## 2024-10-06 NOTE — Progress Notes (Signed)
 Physical Therapy Quick Note  PT has completed initial evaluation.    Overall, patient at supervision assistance level.   PT Follow up recommended: No follow up PT, pt declining possible post-acute PT intervention. Equipment recommended:  None recommended. Pt is encouraged to utilize his SPC. Complete evaluation note to follow.     Bernardino JINNY Ruth, PT, DPT Acute Rehabilitation Office [phone]

## 2024-10-06 NOTE — Progress Notes (Addendum)
 D/c orders noted. Contacted out-pt HD clinic [facility] and informed of pt d/c and anticipated arrival back to clinic tomorrow. D/c summary and last nephrology note faxed at this time.  No further support needed.   Lavanda Chinita Schimpf Dialysis Navigator [phone]

## 2024-10-06 NOTE — Discharge Summary (Signed)
 " Physician Discharge Summary   Patient: Howard Mcmahon MRN: [id number] DOB: Nov 05, 1970  Admit date:     10/04/2024  Discharge date: 10/06/24  Discharge Physician: Deliliah Room   PCP: [facility]   Recommendations at discharge:    F/u with your PCP in one week F/u with pulmonology in one month Continue taking meds as prescribed HD as per schedule  Discharge Diagnoses: Principal Problem:   Acute respiratory failure with hypoxia [facility])   Hospital Course: 54 y.o. male with medical history significant of ESRD on dialysis MWF, type 2 diabetes, hypertension, CAD, limb ischemia s/p right SFA to PT bypass with GSV on 05/20/2024, left BKA and self reported COPD who was brought into the ED by EMS due to shortness of breath. Upon arrival to ED, his blood pressure was significantly elevated, peaked at 200/105. Patient received all his home antihypertensive medications including amlodipine , Coreg  and hydralazine  in the ED, blood pressure improved.   Admitted for management of hypertensive urgency, acute hypoxic aspiratory failure, acute on chronic combined systolic diastolic CHF as well as possible COPD exacerbation.  Acute hypoxic respiratory failure secondary to acute pulmonary edema/acute on chronic combined systolic and diastolic congestive heart failure, POA:  Prior echo done in May 2023 shows EF of 40 to 45% with left ventricular global hypokinesis and grade 1 diastolic dysfunction.   Chest x-ray shows possible early interstitial edema.  proBNP is elevated at 5708 patient still makes urine.  He was initially placed on 2 L nasal oxygen cannula, not requiring any supplemental oxygen now. He did receive a dose of IV Lasix  60 mg in ED. -Follow-up repeat echocardiogram- EF 45-50% with Grade II diastolic dysfunction. - Nephrology consulted for hemodialysis management -Continue with coreg , imdur /hydralazine . -Home O2 evaluation done prior to discharge and he was not requiring any supplemental  oxygen. -F/u with cardiology at [facility].    Acute COPD exacerbation: Resolved - Tested negative for COVID-19, RSV and influenza panel -Received inhalational bronchodilator therapy, doxycycline  as well as steroids. Dced on oral prednisone , doxycycline , albuterol  and breo ellipta  inhalers.  Patient will need outpatient follow-up with pulmonology and PFTs.  He does have a 20-pack-year smoking history.   Elevated troponin/troponinemia: X 2 checked, both at 56.  Likely demand supply mismatch. No chest pain.   Hypertensive urgency: Resolved. Continue with antihypertensives as prescribed and check BP daily at home.   ESRD on HD: MWF schedule, compliant with dialysis.  Consulted nephrology for hemodialysis management. He is on renal transplant list   Hyperlipidemia: Resumed statin.   Insulin  dependent type 2 diabetes mellitus: Resumed home regimen on discharge.   Disposition: Patient lives at home with his mother.       Consultants: Nephrology Procedures performed: HD  Disposition: Home Diet recommendation:  Cardiac and Carb modified diet DISCHARGE MEDICATION: Allergies as of 10/06/2024       Reactions   Lisinopril  Swelling   Lactose Intolerance (gi)    Upset stomach         Medication List     TAKE these medications    acetaminophen  325 MG tablet Commonly known as: Tylenol  Take 2 tablets (650 mg total) by mouth every 6 (six) hours as needed.   albuterol  108 (90 Base) MCG/ACT inhaler Commonly known as: VENTOLIN  HFA Inhale 2 puffs into the lungs every 6 (six) hours as needed for wheezing or shortness of breath.   amLODipine  5 MG tablet Commonly known as: NORVASC  Take 5 mg by mouth 2 (two) times daily.  aspirin  EC 81 MG tablet Take 1 tablet (81 mg total) by mouth at bedtime. Swallow whole.   atorvastatin  80 MG tablet Commonly known as: LIPITOR Take 80 mg by mouth daily.   carvedilol  25 MG tablet Commonly known as: COREG  Take 25 mg by mouth  daily.   doxycycline  100 MG tablet Commonly known as: VIBRA -TABS Take 1 tablet (100 mg total) by mouth every 12 (twelve) hours.   fluticasone  furoate-vilanterol 100-25 MCG/ACT Aepb Commonly known as: Breo Ellipta  Inhale 1 puff into the lungs daily.   hydrALAZINE  50 MG tablet Commonly known as: APRESOLINE  Take 50 mg by mouth daily.   isosorbide  mononitrate 120 MG 24 hr tablet Commonly known as: IMDUR  Take 1 tablet (120 mg total) by mouth daily.   metaxalone  800 MG tablet Commonly known as: SKELAXIN  Take 1 tablet (800 mg total) by mouth 3 (three) times daily.   nitroGLYCERIN  0.4 MG SL tablet Commonly known as: NITROSTAT  Place 0.4 mg under the tongue every 5 (five) minutes as needed for chest pain.   NovoLIN  70/30 Kwikpen (70-30) 100 UNIT/ML KwikPen Generic drug: insulin  isophane & regular human KwikPen Inject 36 Units into the skin 2 (two) times daily.   Ozempic  (0.25 or 0.5 MG/DOSE) 2 MG/3ML Sopn Generic drug: Semaglutide (0.25 or 0.5MG /DOS) Inject 0.5 mg into the skin once a week.   predniSONE  20 MG tablet Commonly known as: DELTASONE  Take 2 tablets (40 mg total) by mouth daily with breakfast. Start taking on: October 07, 2024        Follow-up Information     [place], [facility]. Schedule an appointment as soon as possible for a visit in 1 week(s).   Specialty: Internal Medicine Contact information: [address] [place] [address] [phone]         [facility] [place]. Schedule an appointment as soon as possible for a visit in 1 month(s).   Specialty: Pulmonology Contact information: [address] [place]  [address] [phone] Additional information: [address]  [address]  [place], [place] [address]               Discharge Exam: Fredricka Weights   10/04/24 1520  Weight: 104.3 kg   Constitutional: NAD, calm, comfortable Eyes: PERRL, lids and conjunctivae  normal ENMT: Mucous membranes are moist. Posterior pharynx clear of any exudate or lesions.Normal dentition.  Neck: normal, supple, no masses, no thyromegaly Respiratory: clear breath sounds bilaterally Cardiovascular: Regular rate and rhythm, no murmurs / rubs / gallops. No extremity edema. 2+ pedal pulses. No carotid bruits.  Abdomen: no tenderness, no masses palpated. No hepatosplenomegaly. Bowel sounds positive.  Musculoskeletal: no clubbing / cyanosis. No joint deformity upper and lower extremities. Good ROM, no contractures. Normal muscle tone.  Skin: no rashes, lesions, ulcers. No induration.  Left arm graft in place for hemodialysis Neurologic: CN 2-12 grossly intact. Sensation intact, Psychiatric: Normal judgment and insight. Alert and oriented x 3. Normal mood.    Condition at discharge: good  The results of significant diagnostics from this hospitalization (including imaging, microbiology, ancillary and laboratory) are listed below for reference.   Imaging Studies: ECHOCARDIOGRAM COMPLETE Result Date: 10/05/2024    ECHOCARDIOGRAM REPORT   Patient Name:   RIESE HELLARD Date of Exam: 10/05/2024 Medical Rec #:  [id number]   Height:       76.0 in Accession #:    [id number]  Weight:       230.0 lb Date of  Birth:  [birth year]   BSA:          2.351 m Patient Age:    53 years    BP:           144/87 mmHg Patient Gender: M           HR:           81 bpm. Exam Location:  Inpatient Procedure: 2D Echo, Cardiac Doppler, Color Doppler and Intracardiac            Opacification Agent (Both Spectral and Color Flow Doppler were            utilized during procedure). Indications:    CHF-acute systolic I50.21  History:        Patient has prior history of Echocardiogram examinations, most                 recent 01/15/2022. CAD, Prior CABG; Risk Factors:Diabetes,                 Hypertension, Dyslipidemia and Former Smoker.  Sonographer:    Merlynn Argyle Referring Phys: [id number] RAVI PAHWANI  Sonographer Comments: Image  acquisition challenging due to patient body habitus. IMPRESSIONS  1. Left ventricular ejection fraction, by estimation, is 45 to 50%. The left ventricle has mildly decreased function. The left ventricle demonstrates global hypokinesis. There is mild concentric left ventricular hypertrophy. Left ventricular diastolic parameters are consistent with Grade II diastolic dysfunction (pseudonormalization).  2. Right ventricular systolic function is normal. The right ventricular size is normal. There is normal pulmonary artery systolic pressure. The estimated right ventricular systolic pressure is 34.4 mmHg.  3. The mitral valve is normal in structure. Mild mitral valve regurgitation. No evidence of mitral stenosis.  4. The aortic valve is normal in structure. Aortic valve regurgitation is not visualized. No aortic stenosis is present.  5. The inferior vena cava is dilated in size with <50% respiratory variability, suggesting right atrial pressure of 15 mmHg. FINDINGS  Left Ventricle: Left ventricular ejection fraction, by estimation, is 45 to 50%. The left ventricle has mildly decreased function. The left ventricle demonstrates global hypokinesis. Definity  contrast agent was given IV to delineate the left ventricular  endocardial borders. The left ventricular internal cavity size was normal in size. There is mild concentric left ventricular hypertrophy. Left ventricular diastolic parameters are consistent with Grade II diastolic dysfunction (pseudonormalization). Right Ventricle: The right ventricular size is normal. No increase in right ventricular wall thickness. Right ventricular systolic function is normal. There is normal pulmonary artery systolic pressure. The tricuspid regurgitant velocity is 2.20 m/s, and  with an assumed right atrial pressure of 15 mmHg, the estimated right ventricular systolic pressure is 34.4 mmHg. Left Atrium: Left atrial size was normal in size. Right Atrium: Right atrial size was normal in  size. Pericardium: There is no evidence of pericardial effusion. Mitral Valve: The mitral valve is normal in structure. Mild mitral valve regurgitation. No evidence of mitral valve stenosis. Tricuspid Valve: The tricuspid valve is normal in structure. Tricuspid valve regurgitation is trivial. No evidence of tricuspid stenosis. Aortic Valve: The aortic valve is normal in structure. Aortic valve regurgitation is not visualized. No aortic stenosis is present. Pulmonic Valve: The pulmonic valve was normal in structure. Pulmonic valve regurgitation is not visualized. No evidence of pulmonic stenosis. Aorta: The aortic root is normal in size and structure. Venous: The inferior vena cava is dilated in size with less than 50% respiratory variability, suggesting right atrial pressure of 15  mmHg. IAS/Shunts: No atrial level shunt detected by color flow Doppler.  LEFT VENTRICLE PLAX 2D LVIDd:         5.10 cm      Diastology LVIDs:         4.10 cm      LV e' medial:    5.55 cm/s LV PW:         1.30 cm      LV E/e' medial:  24.0 LV IVS:        1.30 cm      LV e' lateral:   7.83 cm/s LVOT diam:     2.34 cm      LV E/e' lateral: 17.0 LV SV:         102 LV SV Index:   44 LVOT Area:     4.30 cm LV IVRT:       85 msec  LV Volumes (MOD) LV vol d, MOD A2C: 162.0 ml LV vol d, MOD A4C: 168.0 ml LV vol s, MOD A2C: 84.3 ml LV vol s, MOD A4C: 86.3 ml LV SV MOD A2C:     77.7 ml LV SV MOD A4C:     168.0 ml LV SV MOD BP:      88.2 ml RIGHT VENTRICLE             IVC RV Basal diam:  3.32 cm     IVC diam: 2.61 cm RV S prime:     12.50 cm/s TAPSE (M-mode): 1.9 cm      PULMONARY VEINS                             Diastolic Velocity: 81.20 cm/s                             S/D Velocity:       0.60                             Systolic Velocity:  47.60 cm/s LEFT ATRIUM             Index        RIGHT ATRIUM           Index LA diam:        4.36 cm 1.85 cm/m   RA Area:     14.20 cm LA Vol (A2C):   32.2 ml 13.70 ml/m  RA Volume:   32.10 ml  13.65 ml/m  LA Vol (A4C):   41.2 ml 17.53 ml/m LA Biplane Vol: 40.2 ml 17.10 ml/m  AORTIC VALVE LVOT Vmax:   110.00 cm/s LVOT Vmean:  77.100 cm/s LVOT VTI:    0.238 m  AORTA Ao Root diam: 3.39 cm Ao Asc diam:  3.21 cm MITRAL VALVE                TRICUSPID VALVE MV Area (PHT): 4.17 cm     TR Peak grad:   19.4 mmHg MV Decel Time: 182 msec     TR Vmax:        220.00 cm/s MV E velocity: 133.00 cm/s MV A velocity: 84.40 cm/s   SHUNTS MV E/A ratio:  1.58         Systemic VTI:  0.24 m  Systemic Diam: 2.34 cm Toribio Fuel MD Electronically signed by Toribio Fuel MD Signature Date/Time: 10/05/2024/10:57:51 AM    Final    DG Chest Port 1 View Result Date: 10/04/2024 EXAM: 1 VIEW(S) XRAY OF THE CHEST 10/04/2024 03:40:52 PM COMPARISON: Comparison with prior study dated 08/28/2023. CLINICAL HISTORY: Cough and shortness of breath. FINDINGS: LUNGS AND PLEURA: Mild diffuse interstitial pattern, possibly early edema. No focal consolidation. No pleural effusion. No pneumothorax. HEART AND MEDIASTINUM: Postoperative changes in the mediastinum. Mild cardiac enlargement. Mediastinal contours appear intact. BONES AND SOFT TISSUES: No acute osseous abnormality. IMPRESSION: 1. Mild diffuse interstitial pattern, possibly early edema. No focal consolidation. 2. Mild cardiac enlargement. Electronically signed by: Elsie Gravely MD 10/04/2024 03:45 PM EST RP Workstation: [id number]    Microbiology: Results for orders placed or performed during the hospital encounter of 10/04/24  Resp panel by RT-PCR (RSV, Flu A&B, Covid) Anterior Nasal Swab     Status: None   Collection Time: 10/04/24  3:20 PM   Specimen: Anterior Nasal Swab  Result Value Ref Range Status   SARS Coronavirus 2 by RT PCR NEGATIVE NEGATIVE Final   Influenza A by PCR NEGATIVE NEGATIVE Final   Influenza B by PCR NEGATIVE NEGATIVE Final    Comment: (NOTE) The Xpert Xpress SARS-CoV-2/FLU/RSV plus assay is intended as an aid in the diagnosis  of influenza from Nasopharyngeal swab specimens and should not be used as a sole basis for treatment. Nasal washings and aspirates are unacceptable for Xpert Xpress SARS-CoV-2/FLU/RSV testing.  Fact Sheet for Patients: bloggercourse.com  Fact Sheet for Healthcare Providers: seriousbroker.it  This test is not yet approved or cleared by the [place]  FDA and has been authorized for detection and/or diagnosis of SARS-CoV-2 by FDA under an Emergency Use Authorization (EUA). This EUA will remain in effect (meaning this test can be used) for the duration of the COVID-19 declaration under Section 564(b)(1) of the Act, 21 U.S.C. section 360bbb-3(b)(1), unless the authorization is terminated or revoked.     Resp Syncytial Virus by PCR NEGATIVE NEGATIVE Final    Comment: (NOTE) Fact Sheet for Patients: bloggercourse.com  Fact Sheet for Healthcare Providers: seriousbroker.it  This test is not yet approved or cleared by the [place]  FDA and has been authorized for detection and/or diagnosis of SARS-CoV-2 by FDA under an Emergency Use Authorization (EUA). This EUA will remain in effect (meaning this test can be used) for the duration of the COVID-19 declaration under Section 564(b)(1) of the Act, 21 U.S.C. section 360bbb-3(b)(1), unless the authorization is terminated or revoked.  Performed at [facility], [address], [place], [place] [address]   Respiratory (~20 pathogens) panel by PCR     Status: None   Collection Time: 10/05/24  4:18 AM   Specimen: Nasopharyngeal Swab; Respiratory  Result Value Ref Range Status   Adenovirus NOT DETECTED NOT DETECTED Final   Coronavirus 229E NOT DETECTED NOT DETECTED Final    Comment: (NOTE) The Coronavirus on the Respiratory Panel, DOES NOT test for the novel  Coronavirus (2019 nCoV)    Coronavirus HKU1 NOT DETECTED NOT DETECTED  Final   Coronavirus NL63 NOT DETECTED NOT DETECTED Final   Coronavirus OC43 NOT DETECTED NOT DETECTED Final   Metapneumovirus NOT DETECTED NOT DETECTED Final   Rhinovirus / Enterovirus NOT DETECTED NOT DETECTED Final   Influenza A NOT DETECTED NOT DETECTED Final   Influenza B NOT DETECTED NOT DETECTED Final   Parainfluenza Virus 1 NOT DETECTED NOT DETECTED Final   Parainfluenza  Virus 2 NOT DETECTED NOT DETECTED Final   Parainfluenza Virus 3 NOT DETECTED NOT DETECTED Final   Parainfluenza Virus 4 NOT DETECTED NOT DETECTED Final   Respiratory Syncytial Virus NOT DETECTED NOT DETECTED Final   Bordetella pertussis NOT DETECTED NOT DETECTED Final   Bordetella Parapertussis NOT DETECTED NOT DETECTED Final   Chlamydophila pneumoniae NOT DETECTED NOT DETECTED Final   Mycoplasma pneumoniae NOT DETECTED NOT DETECTED Final    Comment: Performed at [facility], [address], [place], [place] [address]    Labs: CBC: Recent Labs  Lab 10/04/24 1530 10/05/24 0418  WBC 11.4* 10.9*  NEUTROABS 8.0*  --   HGB 11.9* 10.6*  HCT 37.5* 33.1*  MCV 106.5* 106.4*  PLT 209 190   Basic Metabolic Panel: Recent Labs  Lab 10/04/24 1530 10/05/24 0418  NA 144 140  K 4.1 4.6  CL 108 105  CO2 23 20*  GLUCOSE 128* 209*  BUN 39* 50*  CREATININE 6.62* 7.30*  CALCIUM  8.6* 8.5*  MG  --  2.0  PHOS  --  3.8   Liver Function Tests: Recent Labs  Lab 10/04/24 1530  AST 23  ALT 10  ALKPHOS 67  BILITOT 0.5  PROT 7.3  ALBUMIN  3.5   CBG: Recent Labs  Lab 10/04/24 2120 10/04/24 2336 10/05/24 0842 10/05/24 2006 10/06/24 0745  GLUCAP 291* 253* 143* 258* 120*    Discharge time spent: 40 minutes.  Signed: Deliliah Room, MD [facility] 10/06/2024 "

## 2024-10-06 NOTE — Progress Notes (Signed)
 " [facility] Progress Note   Subjective:   Says he had some shortness of breath during dialysis but resolved today. Denies SOB, CP< dizziness, nausea. On RA.  Objective Vitals:   10/05/24 1820 10/05/24 2020 10/06/24 0410 10/06/24 0835  BP: 120/77  132/84 126/81  Pulse: 80  80 71  Resp: 18  18 18   Temp: 97.8 F (36.6 C)  97.8 F (36.6 C) 98.5 F (36.9 C)  TempSrc: Oral  Oral   SpO2: 96% 95% 92% 95%  Weight:      Height:       Physical Exam General: alert, well appearing male, NAD Heart: RRR, no murmur Lungs: CTA bilaterally, respirations unlabored Abdomen: Soft, non-distended, +BS Extremities: trace edema RLE Dialysis Access:  LUE AVF + T/b  Additional Objective Labs: Basic Metabolic Panel: Recent Labs  Lab 10/04/24 1530 10/05/24 0418  NA 144 140  K 4.1 4.6  CL 108 105  CO2 23 20*  GLUCOSE 128* 209*  BUN 39* 50*  CREATININE 6.62* 7.30*  CALCIUM  8.6* 8.5*  PHOS  --  3.8   Liver Function Tests: Recent Labs  Lab 10/04/24 1530  AST 23  ALT 10  ALKPHOS 67  BILITOT 0.5  PROT 7.3  ALBUMIN  3.5   No results for input(s): LIPASE, AMYLASE in the last 168 hours. CBC: Recent Labs  Lab 10/04/24 1530 10/05/24 0418  WBC 11.4* 10.9*  NEUTROABS 8.0*  --   HGB 11.9* 10.6*  HCT 37.5* 33.1*  MCV 106.5* 106.4*  PLT 209 190   Blood Culture    Component Value Date/Time   SDES ABSCESS 11/12/2023 0830   SPECREQUEST SCROTUM 11/12/2023 0830   CULT  11/12/2023 0830    MODERATE DIPHTHEROIDS(CORYNEBACTERIUM SPECIES) Standardized susceptibility testing for this organism is not available. Performed at [facility], [address], [place], [place] [address]    REPTSTATUS 11/15/2023 FINAL 11/12/2023 0830    Cardiac Enzymes: No results for input(s): CKTOTAL, CKMB, CKMBINDEX, TROPONINI in the last 168 hours. CBG: Recent Labs  Lab 10/04/24 2120 10/04/24 2336 10/05/24 0842 10/05/24 2006 10/06/24 0745  GLUCAP 291* 253* 143* 258*  120*   Iron Studies: No results for input(s): IRON, TIBC, TRANSFERRIN, FERRITIN in the last 72 hours. @lablastinr3 @ Studies/Results: ECHOCARDIOGRAM COMPLETE Result Date: 10/05/2024    ECHOCARDIOGRAM REPORT   Patient Name:   [facility] Date of Exam: 10/05/2024 Medical Rec #:  [id number]   Height:       76.0 in Accession #:    [id number]  Weight:       230.0 lb Date of Birth:  [birth year]   BSA:          2.351 m Patient Age:    53 years    BP:           144/87 mmHg Patient Gender: M           HR:           81 bpm. Exam Location:  Inpatient Procedure: 2D Echo, Cardiac Doppler, Color Doppler and Intracardiac            Opacification Agent (Both Spectral and Color Flow Doppler were            utilized during procedure). Indications:    CHF-acute systolic I50.21  History:        Patient has prior history of Echocardiogram examinations, most                 recent 01/15/2022. CAD,  Prior CABG; Risk Factors:Diabetes,                 Hypertension, Dyslipidemia and Former Smoker.  Sonographer:    Merlynn Argyle Referring Phys: [id number] RAVI PAHWANI  Sonographer Comments: Image acquisition challenging due to patient body habitus. IMPRESSIONS  1. Left ventricular ejection fraction, by estimation, is 45 to 50%. The left ventricle has mildly decreased function. The left ventricle demonstrates global hypokinesis. There is mild concentric left ventricular hypertrophy. Left ventricular diastolic parameters are consistent with Grade II diastolic dysfunction (pseudonormalization).  2. Right ventricular systolic function is normal. The right ventricular size is normal. There is normal pulmonary artery systolic pressure. The estimated right ventricular systolic pressure is 34.4 mmHg.  3. The mitral valve is normal in structure. Mild mitral valve regurgitation. No evidence of mitral stenosis.  4. The aortic valve is normal in structure. Aortic valve regurgitation is not visualized. No aortic stenosis is present.  5. The inferior vena  cava is dilated in size with <50% respiratory variability, suggesting right atrial pressure of 15 mmHg. FINDINGS  Left Ventricle: Left ventricular ejection fraction, by estimation, is 45 to 50%. The left ventricle has mildly decreased function. The left ventricle demonstrates global hypokinesis. Definity  contrast agent was given IV to delineate the left ventricular  endocardial borders. The left ventricular internal cavity size was normal in size. There is mild concentric left ventricular hypertrophy. Left ventricular diastolic parameters are consistent with Grade II diastolic dysfunction (pseudonormalization). Right Ventricle: The right ventricular size is normal. No increase in right ventricular wall thickness. Right ventricular systolic function is normal. There is normal pulmonary artery systolic pressure. The tricuspid regurgitant velocity is 2.20 m/s, and  with an assumed right atrial pressure of 15 mmHg, the estimated right ventricular systolic pressure is 34.4 mmHg. Left Atrium: Left atrial size was normal in size. Right Atrium: Right atrial size was normal in size. Pericardium: There is no evidence of pericardial effusion. Mitral Valve: The mitral valve is normal in structure. Mild mitral valve regurgitation. No evidence of mitral valve stenosis. Tricuspid Valve: The tricuspid valve is normal in structure. Tricuspid valve regurgitation is trivial. No evidence of tricuspid stenosis. Aortic Valve: The aortic valve is normal in structure. Aortic valve regurgitation is not visualized. No aortic stenosis is present. Pulmonic Valve: The pulmonic valve was normal in structure. Pulmonic valve regurgitation is not visualized. No evidence of pulmonic stenosis. Aorta: The aortic root is normal in size and structure. Venous: The inferior vena cava is dilated in size with less than 50% respiratory variability, suggesting right atrial pressure of 15 mmHg. IAS/Shunts: No atrial level shunt detected by color flow Doppler.   LEFT VENTRICLE PLAX 2D LVIDd:         5.10 cm      Diastology LVIDs:         4.10 cm      LV e' medial:    5.55 cm/s LV PW:         1.30 cm      LV E/e' medial:  24.0 LV IVS:        1.30 cm      LV e' lateral:   7.83 cm/s LVOT diam:     2.34 cm      LV E/e' lateral: 17.0 LV SV:         102 LV SV Index:   44 LVOT Area:     4.30 cm LV IVRT:       85 msec  LV  Volumes (MOD) LV vol d, MOD A2C: 162.0 ml LV vol d, MOD A4C: 168.0 ml LV vol s, MOD A2C: 84.3 ml LV vol s, MOD A4C: 86.3 ml LV SV MOD A2C:     77.7 ml LV SV MOD A4C:     168.0 ml LV SV MOD BP:      88.2 ml RIGHT VENTRICLE             IVC RV Basal diam:  3.32 cm     IVC diam: 2.61 cm RV S prime:     12.50 cm/s TAPSE (M-mode): 1.9 cm      PULMONARY VEINS                             Diastolic Velocity: 81.20 cm/s                             S/D Velocity:       0.60                             Systolic Velocity:  47.60 cm/s LEFT ATRIUM             Index        RIGHT ATRIUM           Index LA diam:        4.36 cm 1.85 cm/m   RA Area:     14.20 cm LA Vol (A2C):   32.2 ml 13.70 ml/m  RA Volume:   32.10 ml  13.65 ml/m LA Vol (A4C):   41.2 ml 17.53 ml/m LA Biplane Vol: 40.2 ml 17.10 ml/m  AORTIC VALVE LVOT Vmax:   110.00 cm/s LVOT Vmean:  77.100 cm/s LVOT VTI:    0.238 m  AORTA Ao Root diam: 3.39 cm Ao Asc diam:  3.21 cm MITRAL VALVE                TRICUSPID VALVE MV Area (PHT): 4.17 cm     TR Peak grad:   19.4 mmHg MV Decel Time: 182 msec     TR Vmax:        220.00 cm/s MV E velocity: 133.00 cm/s MV A velocity: 84.40 cm/s   SHUNTS MV E/A ratio:  1.58         Systemic VTI:  0.24 m                             Systemic Diam: 2.34 cm Toribio Fuel MD Electronically signed by Toribio Fuel MD Signature Date/Time: 10/05/2024/10:57:51 AM    Final    DG Chest Port 1 View Result Date: 10/04/2024 EXAM: 1 VIEW(S) XRAY OF THE CHEST 10/04/2024 03:40:52 PM COMPARISON: Comparison with prior study dated 08/28/2023. CLINICAL HISTORY: Cough and shortness of breath.  FINDINGS: LUNGS AND PLEURA: Mild diffuse interstitial pattern, possibly early edema. No focal consolidation. No pleural effusion. No pneumothorax. HEART AND MEDIASTINUM: Postoperative changes in the mediastinum. Mild cardiac enlargement. Mediastinal contours appear intact. BONES AND SOFT TISSUES: No acute osseous abnormality. IMPRESSION: 1. Mild diffuse interstitial pattern, possibly early edema. No focal consolidation. 2. Mild cardiac enlargement. Electronically signed by: Elsie Gravely MD 10/04/2024 03:45 PM EST RP Workstation: [id number]   Medications:   aspirin  EC  81 mg Oral QHS   atorvastatin   80 mg Oral  Daily   carvedilol   25 mg Oral BID   Chlorhexidine  Gluconate Cloth  6 each Topical Q0600   doxycycline   100 mg Oral Q12H   heparin   5,000 Units Subcutaneous Q8H   insulin  aspart  0-15 Units Subcutaneous TID WC   insulin  aspart  0-5 Units Subcutaneous QHS   insulin  aspart protamine- aspart  18 Units Subcutaneous BID WC   ipratropium-albuterol   3 mL Nebulization TID   isosorbide  mononitrate  120 mg Oral Daily   metaxalone   800 mg Oral TID   predniSONE   40 mg Oral Q breakfast   sodium chloride  flush  3 mL Intravenous Q12H    Dialysis Orders: [facility] M[facility] [address] From sept 2025 --> 4h  B425   122.5kg  AVF  Heparin  3000 bolus + 500u/hr Chronic hepatitis B infection  Assessment/Plan: # ESRD - on HD MWF in [place] - chronic hep B infection - s/p HD here Monday, no urgent indications for dialysis today, appears he is discharging today.   # HTN - Blood pressure well controlled, continue home meds   # Volume - volume overload, pulm edema on exam/ CXR; not in distress, mostly DOE - per pt they haven't been pulling much because he comes in close to dry wt - likely losing body wt and will need lowering of dry wt - resolved post HD    # Anemia of esrd - Hb 10-12 here, follow    Lucie Collet, PA-C 10/06/2024, 9:48 AM  [facility] Pager: [phone]   "

## 2024-10-06 NOTE — Progress Notes (Signed)
 Pt did not drop below 94% on RA during walk down the hallway, although breathing did seem very labored. MD aware.  Pt did seem unsteady during walk. Pt had prosthesis on, but refused walker. MD aware. PT/OT order in. Pending eval for d/c.

## 2024-10-06 NOTE — Evaluation (Signed)
 Physical Therapy Evaluation Patient Details Name: Howard Mcmahon MRN: [id number] DOB: 03-Jul-1971 Today's Date: 10/06/2024  History of Present Illness  54 y.o. male presents to [facility] hospital on 10/04/2024 with SOB. Imaging notable for pulmonary edema. PMH includes ESRD, DMII, HTN, CAD, RLE bypass 05/2024, L BKA, COPD.  Clinical Impression  Pt presents to PT with deficits in activity tolerance, cardiopulmonary function, gait, balance. Pt is able to ambulate for household distances, reporting increased work of breathing and fatigue. SpO2 remains 91% and higher with activity on pulse ox when reading reliably. Pt is encouraged to utilize a SPC to improve balance and reduce risk for falls. Pt declines post-acute PT services.        If plan is discharge home, recommend the following: Help with stairs or ramp for entrance;Assist for transportation;A little help with bathing/dressing/bathroom   Can travel by private vehicle        Equipment Recommendations None recommended by PT (encouraged pt to utilize [facility])  Recommendations for Other Services       Functional Status Assessment Patient has had a recent decline in their functional status and demonstrates the ability to make significant improvements in function in a reasonable and predictable amount of time.     Precautions / Restrictions Precautions Precautions: Fall Recall of Precautions/Restrictions: Intact Restrictions Weight Bearing Restrictions Per Provider Order: No      Mobility  Bed Mobility                    Transfers Overall transfer level: Modified independent Equipment used:  (bed rail)                    Ambulation/Gait Ambulation/Gait assistance: Supervision Gait Distance (Feet): 80 Feet (80' x 2, brief standing rest break in between bouts) Assistive device:  (PRN support of railing) Gait Pattern/deviations: Step-through pattern, Wide base of support Gait velocity: reduced Gait velocity interpretation:  <1.8 ft/sec, indicate of risk for recurrent falls   General Gait Details: slowed step-through gait with widened BOS, increased lateral sway when without UE support  Stairs            Wheelchair Mobility     Tilt Bed    Modified Rankin (Stroke Patients Only)       Balance Overall balance assessment: Needs assistance Sitting-balance support: No upper extremity supported, Feet supported Sitting balance-Leahy Scale: Good     Standing balance support: Single extremity supported, Reliant on assistive device for balance Standing balance-Leahy Scale: Poor                               Pertinent Vitals/Pain Pain Assessment Pain Assessment: No/denies pain    Home Living Family/patient expects to be discharged to:: Private residence Living Arrangements: Parent Available Help at Discharge: Family;Available 24 hours/day Type of Home: Apartment Home Access: Level entry       Home Layout: One level Home Equipment: Agricultural [employer]ant (2 wheels);Cane - single point;Shower seat;Wheelchair - manual      Prior Function Prior Level of Function : Needs assist             Mobility Comments: ambulates for household distances, also often utilizes his manual wheelchair in the home to give his residual limb a break from his prosthetic. Typically utilizing a MWC for community mobility ADLs Comments: PRN assistance from mother for IADLs     Extremity/Trunk Assessment   Upper Extremity Assessment Upper Extremity Assessment:  Overall WFL for tasks assessed    Lower Extremity Assessment Lower Extremity Assessment: Generalized weakness    Cervical / Trunk Assessment Cervical / Trunk Assessment: Other exceptions Cervical / Trunk Exceptions: body habitus  Communication   Communication Communication: No apparent difficulties    Cognition Arousal: Alert Behavior During Therapy: WFL for tasks assessed/performed   PT - Cognitive impairments: No apparent impairments                          Following commands: Intact       Cueing Cueing Techniques: Verbal cues     General Comments General comments (skin integrity, edema, etc.): 91-94% on portable pulse ox with ambulation    Exercises     Assessment/Plan    PT Assessment Patient needs continued PT services  PT Problem List Decreased activity tolerance;Decreased balance;Decreased mobility;Decreased knowledge of use of DME;Cardiopulmonary status limiting activity       PT Treatment Interventions DME instruction;Gait training;Functional mobility training;Stair training;Therapeutic activities;Therapeutic exercise;Balance training;Neuromuscular re-education;Patient/family education    PT Goals (Current goals can be found in the Care Plan section)  Acute Rehab PT Goals Patient Stated Goal: to return to independence, improve activity tolerance PT Goal Formulation: With patient Time For Goal Achievement: 10/20/24 Potential to Achieve Goals: Good Additional Goals Additional Goal #1: Pt will score >19/24 on the DGI to indicate a reduced risk for falls Additional Goal #2: Pt will report 1/4 DOE or less when ambulating for >200' to demonstrate improved activity tolerance    Frequency Min 2X/week     Co-evaluation               AM-PAC PT 6 Clicks Mobility  Outcome Measure Help needed turning from your back to your side while in a flat bed without using bedrails?: None Help needed moving from lying on your back to sitting on the side of a flat bed without using bedrails?: None Help needed moving to and from a bed to a chair (including a wheelchair)?: None Help needed standing up from a chair using your arms (e.g., wheelchair or bedside chair)?: None Help needed to walk in hospital room?: A Little Help needed climbing 3-5 steps with a railing? : A Lot 6 Click Score: 21    End of Session Equipment Utilized During Treatment: Gait belt Activity Tolerance: Patient tolerated  treatment well Patient left: in bed;with call bell/phone within reach Nurse Communication: Mobility status PT Visit Diagnosis: Other abnormalities of gait and mobility (R26.89)    Time: 8853-8794 PT Time Calculation (min) (ACUTE ONLY): 19 min   Charges:   PT Evaluation $PT Eval Low Complexity: 1 Low   PT General Charges $$ ACUTE PT VISIT: 1 Visit         Bernardino JINNY Ruth, PT, DPT Acute Rehabilitation Office [phone]   Bernardino JINNY Ruth 10/06/2024, 12:37 PM

## 2024-10-06 NOTE — Discharge Instructions (Signed)
 SABRA

## 2024-10-06 NOTE — TOC Transition Note (Signed)
 Transition of Care [facility]) - Discharge Note   Patient Details  Name: Howard Mcmahon MRN: [id number] Date of Birth: 06-Mar-1971  [facility] CM/SW Contact:  Tom-Johnson, Harvest Muskrat, RN Phone Number: 10/06/2024, 10:04 AM   Clinical Narrative:     Patient is scheduled for discharge today.  Readmission Risk Assessment done. Hospital f/u and discharge instructions on AVS. No ICM needs or recommendations noted. Cab voucher will be given at the d/c lounge to transport at discharge.  No further ICM needs noted.      Final next level of care: Home/Self Care Barriers to Discharge: Barriers Resolved   Patient Goals and CMS Choice Patient states their goals for this hospitalization and ongoing recovery are:: To return home CMS Medicare.gov Compare Post Acute Care list provided to:: Patient Choice offered to / list presented to : NA      Discharge Placement                Patient to be transferred to facility by: [facility]      Discharge Plan and Services Additional resources added to the After Visit Summary for                  DME Arranged: N/A DME Agency: NA       HH Arranged: NA HH Agency: NA        Social Drivers of Health (SDOH) Interventions SDOH Screenings   Food Insecurity: No Food Insecurity (10/05/2024)  Housing: Low Risk (10/05/2024)  Transportation Needs: No Transportation Needs (10/05/2024)  Utilities: Not At Risk (10/05/2024)  Depression (PHQ2-9): Low Risk (02/01/2022)  Social Connections: Unknown (01/22/2022)   Received from [facility]  Tobacco Use: High Risk (10/06/2024)     Readmission Risk Interventions     No data to display

## 2024-10-06 NOTE — Care Management Obs Status (Signed)
 MEDICARE OBSERVATION STATUS NOTIFICATION   Patient Details  Name: Decorey Wahlert MRN: [id number] Date of Birth: [birth year]   Medicare Observation Status Notification Given:  Yes    Tom-Johnson, Harvest Muskrat, RN 10/06/2024, 10:00 AM

## 2024-10-07 LAB — HEPATITIS B SURFACE ANTIBODY, QUANTITATIVE: Hep B S AB Quant (Post): <3.5 m[IU]/mL — ABNORMAL LOW
